# Patient Record
Sex: Female | Born: 1950
Health system: Southern US, Community
[De-identification: ages and names within clinical notes are randomized; demographics above are authoritative.]

## PROBLEM LIST (undated history)

## (undated) DIAGNOSIS — Z8616 Personal history of COVID-19: Secondary | ICD-10-CM

## (undated) DIAGNOSIS — M1990 Unspecified osteoarthritis, unspecified site: Secondary | ICD-10-CM

## (undated) DIAGNOSIS — M5136 Other intervertebral disc degeneration, lumbar region: Secondary | ICD-10-CM

## (undated) DIAGNOSIS — R7303 Prediabetes: Secondary | ICD-10-CM

## (undated) DIAGNOSIS — D649 Anemia, unspecified: Secondary | ICD-10-CM

## (undated) DIAGNOSIS — I82409 Acute embolism and thrombosis of unspecified deep veins of unspecified lower extremity: Secondary | ICD-10-CM

## (undated) DIAGNOSIS — H35411 Lattice degeneration of retina, right eye: Secondary | ICD-10-CM

## (undated) DIAGNOSIS — N950 Postmenopausal bleeding: Secondary | ICD-10-CM

## (undated) DIAGNOSIS — Z862 Personal history of diseases of the blood and blood-forming organs and certain disorders involving the immune mechanism: Secondary | ICD-10-CM

## (undated) DIAGNOSIS — I10 Essential (primary) hypertension: Secondary | ICD-10-CM

## (undated) DIAGNOSIS — M51369 Other intervertebral disc degeneration, lumbar region without mention of lumbar back pain or lower extremity pain: Secondary | ICD-10-CM

## (undated) DIAGNOSIS — Z789 Other specified health status: Secondary | ICD-10-CM

## (undated) DIAGNOSIS — M8580 Other specified disorders of bone density and structure, unspecified site: Secondary | ICD-10-CM

## (undated) DIAGNOSIS — E039 Hypothyroidism, unspecified: Secondary | ICD-10-CM

## (undated) DIAGNOSIS — G709 Myoneural disorder, unspecified: Secondary | ICD-10-CM

## (undated) DIAGNOSIS — K219 Gastro-esophageal reflux disease without esophagitis: Secondary | ICD-10-CM

## (undated) HISTORY — DX: Anemia, unspecified: D64.9

## (undated) HISTORY — PX: OOPHORECTOMY: SHX86

## (undated) HISTORY — DX: Hypothyroidism, unspecified: E03.9

## (undated) HISTORY — DX: Acute embolism and thrombosis of unspecified deep veins of unspecified lower extremity: I82.409

## (undated) HISTORY — PX: JOINT REPLACEMENT: SHX530

## (undated) HISTORY — DX: Gastro-esophageal reflux disease without esophagitis: K21.9

## (undated) HISTORY — DX: Essential (primary) hypertension: I10

---

## 1997-05-14 HISTORY — PX: OOPHORECTOMY: SHX86

## 1999-05-15 DIAGNOSIS — K219 Gastro-esophageal reflux disease without esophagitis: Secondary | ICD-10-CM

## 1999-05-15 HISTORY — DX: Gastro-esophageal reflux disease without esophagitis: K21.9

## 2001-05-14 DIAGNOSIS — Z86718 Personal history of other venous thrombosis and embolism: Secondary | ICD-10-CM

## 2001-05-14 HISTORY — DX: Personal history of other venous thrombosis and embolism: Z86.718

## 2003-03-16 HISTORY — PX: KNEE ARTHROSCOPY: SHX127

## 2003-05-15 DIAGNOSIS — D649 Anemia, unspecified: Secondary | ICD-10-CM

## 2003-05-15 HISTORY — DX: Anemia, unspecified: D64.9

## 2003-07-07 ENCOUNTER — Other Ambulatory Visit: Admission: RE | Admit: 2003-07-07 | Discharge: 2003-07-07 | Payer: Self-pay | Admitting: Internal Medicine

## 2003-07-21 ENCOUNTER — Encounter: Payer: Self-pay | Admitting: Gastroenterology

## 2003-07-28 ENCOUNTER — Encounter: Payer: Self-pay | Admitting: Gastroenterology

## 2004-02-09 ENCOUNTER — Emergency Department (HOSPITAL_COMMUNITY): Admission: EM | Admit: 2004-02-09 | Discharge: 2004-02-09 | Payer: Self-pay | Admitting: Emergency Medicine

## 2004-04-05 ENCOUNTER — Ambulatory Visit: Payer: Self-pay | Admitting: Internal Medicine

## 2004-04-12 ENCOUNTER — Ambulatory Visit: Payer: Self-pay | Admitting: Internal Medicine

## 2004-07-04 ENCOUNTER — Ambulatory Visit: Payer: Self-pay | Admitting: Internal Medicine

## 2004-07-12 ENCOUNTER — Ambulatory Visit: Payer: Self-pay | Admitting: Internal Medicine

## 2004-08-23 ENCOUNTER — Encounter: Admission: RE | Admit: 2004-08-23 | Discharge: 2004-08-23 | Payer: Self-pay | Admitting: Internal Medicine

## 2004-08-28 ENCOUNTER — Ambulatory Visit: Payer: Self-pay | Admitting: Internal Medicine

## 2004-09-20 ENCOUNTER — Ambulatory Visit: Payer: Self-pay | Admitting: Internal Medicine

## 2004-10-11 ENCOUNTER — Ambulatory Visit: Payer: Self-pay | Admitting: Internal Medicine

## 2004-10-24 ENCOUNTER — Ambulatory Visit: Payer: Self-pay | Admitting: Family Medicine

## 2004-10-25 ENCOUNTER — Ambulatory Visit: Payer: Self-pay | Admitting: Internal Medicine

## 2004-11-28 ENCOUNTER — Ambulatory Visit: Payer: Self-pay | Admitting: Internal Medicine

## 2005-03-14 ENCOUNTER — Ambulatory Visit: Payer: Self-pay | Admitting: Internal Medicine

## 2005-03-21 ENCOUNTER — Other Ambulatory Visit: Admission: RE | Admit: 2005-03-21 | Discharge: 2005-03-21 | Payer: Self-pay | Admitting: Internal Medicine

## 2005-03-21 ENCOUNTER — Encounter: Payer: Self-pay | Admitting: Internal Medicine

## 2005-03-21 ENCOUNTER — Ambulatory Visit: Payer: Self-pay | Admitting: Internal Medicine

## 2005-07-17 ENCOUNTER — Ambulatory Visit: Payer: Self-pay | Admitting: Internal Medicine

## 2005-07-18 ENCOUNTER — Ambulatory Visit: Payer: Self-pay | Admitting: Internal Medicine

## 2005-07-27 ENCOUNTER — Ambulatory Visit: Payer: Self-pay | Admitting: Internal Medicine

## 2005-07-30 ENCOUNTER — Ambulatory Visit: Payer: Self-pay | Admitting: Internal Medicine

## 2005-07-31 ENCOUNTER — Ambulatory Visit: Payer: Self-pay | Admitting: Internal Medicine

## 2006-01-02 ENCOUNTER — Ambulatory Visit: Payer: Self-pay | Admitting: Internal Medicine

## 2006-02-01 ENCOUNTER — Ambulatory Visit: Payer: Self-pay | Admitting: Internal Medicine

## 2006-02-07 ENCOUNTER — Encounter: Admission: RE | Admit: 2006-02-07 | Discharge: 2006-02-07 | Payer: Self-pay | Admitting: Internal Medicine

## 2006-03-20 ENCOUNTER — Ambulatory Visit: Payer: Self-pay | Admitting: Internal Medicine

## 2006-03-20 LAB — CONVERTED CEMR LAB
ALT: 26 units/L (ref 0–40)
AST: 31 units/L (ref 0–37)
Albumin: 3.6 g/dL (ref 3.5–5.2)
Alkaline Phosphatase: 64 units/L (ref 39–117)
BUN: 10 mg/dL (ref 6–23)
Basophils Absolute: 0 10*3/uL (ref 0.0–0.1)
Basophils Relative: 0.7 % (ref 0.0–1.0)
CO2: 28 meq/L (ref 19–32)
Calcium: 9.7 mg/dL (ref 8.4–10.5)
Chloride: 105 meq/L (ref 96–112)
Chol/HDL Ratio, serum: 4.6
Cholesterol: 204 mg/dL (ref 0–200)
Creatinine, Ser: 0.7 mg/dL (ref 0.4–1.2)
Eosinophil percent: 1.5 % (ref 0.0–5.0)
Free T4: 0.7 ng/dL — ABNORMAL LOW (ref 0.9–1.8)
GFR calc non Af Amer: 92 mL/min
Glomerular Filtration Rate, Af Am: 111 mL/min/{1.73_m2}
Glucose, Bld: 94 mg/dL (ref 70–99)
HCT: 37.5 % (ref 36.0–46.0)
HDL: 44.8 mg/dL (ref >39.0)
Hemoglobin: 12.5 g/dL (ref 12.0–15.0)
LDL DIRECT: 142.2 mg/dL
Lymphocytes Relative: 40.1 % (ref 12.0–46.0)
MCHC: 33.3 g/dL (ref 30.0–36.0)
MCV: 91.8 fL (ref 78.0–100.0)
Monocytes Absolute: 0.3 10*3/uL (ref 0.2–0.7)
Monocytes Relative: 6.8 % (ref 3.0–11.0)
Neutro Abs: 2.4 10*3/uL (ref 1.4–7.7)
Neutrophils Relative %: 50.9 % (ref 43.0–77.0)
Platelets: 264 10*3/uL (ref 150–400)
Potassium: 3.8 meq/L (ref 3.5–5.1)
RBC: 4.08 M/uL (ref 3.87–5.11)
RDW: 12.3 % (ref 11.5–14.6)
Sodium: 138 meq/L (ref 135–145)
TSH: 5.31 microintl units/mL (ref 0.35–5.50)
Total Bilirubin: 1.2 mg/dL (ref 0.3–1.2)
Total Protein: 7.9 g/dL (ref 6.0–8.3)
Triglyceride fasting, serum: 67 mg/dL (ref 0–149)
VLDL: 13 mg/dL (ref 0–40)
WBC: 4.7 10*3/uL (ref 4.5–10.5)

## 2006-03-25 ENCOUNTER — Encounter: Admission: RE | Admit: 2006-03-25 | Discharge: 2006-03-25 | Payer: Self-pay | Admitting: Internal Medicine

## 2006-03-26 ENCOUNTER — Ambulatory Visit: Payer: Self-pay | Admitting: Internal Medicine

## 2006-07-11 ENCOUNTER — Ambulatory Visit: Payer: Self-pay | Admitting: Internal Medicine

## 2006-08-01 ENCOUNTER — Encounter: Admission: RE | Admit: 2006-08-01 | Discharge: 2006-08-01 | Payer: Self-pay | Admitting: Internal Medicine

## 2006-08-13 ENCOUNTER — Ambulatory Visit: Payer: Self-pay | Admitting: Internal Medicine

## 2006-09-17 ENCOUNTER — Ambulatory Visit: Payer: Self-pay | Admitting: Internal Medicine

## 2006-09-17 LAB — CONVERTED CEMR LAB
Cholesterol: 195 mg/dL (ref 0–200)
HDL: 44.2 mg/dL (ref >39.0)
LDL Cholesterol: 134 mg/dL — ABNORMAL HIGH (ref 0–99)
TSH: 8.43 microintl units/mL — ABNORMAL HIGH (ref 0.35–5.50)
Total CHOL/HDL Ratio: 4.4
Triglycerides: 82 mg/dL (ref 0–149)
VLDL: 16 mg/dL (ref 0–40)

## 2006-09-23 ENCOUNTER — Ambulatory Visit: Payer: Self-pay | Admitting: Internal Medicine

## 2006-11-11 ENCOUNTER — Ambulatory Visit: Payer: Self-pay | Admitting: Internal Medicine

## 2006-11-12 ENCOUNTER — Ambulatory Visit: Payer: Self-pay | Admitting: Internal Medicine

## 2006-11-18 DIAGNOSIS — I10 Essential (primary) hypertension: Secondary | ICD-10-CM | POA: Insufficient documentation

## 2006-11-18 DIAGNOSIS — D649 Anemia, unspecified: Secondary | ICD-10-CM | POA: Insufficient documentation

## 2006-11-18 DIAGNOSIS — K219 Gastro-esophageal reflux disease without esophagitis: Secondary | ICD-10-CM | POA: Insufficient documentation

## 2006-11-18 DIAGNOSIS — E039 Hypothyroidism, unspecified: Secondary | ICD-10-CM | POA: Insufficient documentation

## 2006-12-30 ENCOUNTER — Ambulatory Visit: Payer: Self-pay | Admitting: Internal Medicine

## 2006-12-31 LAB — CONVERTED CEMR LAB
Basophils Absolute: 0 10*3/uL (ref 0.0–0.1)
Basophils Relative: 0.4 % (ref 0.0–1.0)
Eosinophils Absolute: 0.1 10*3/uL (ref 0.0–0.6)
Eosinophils Relative: 2.1 % (ref 0.0–5.0)
Ferritin: 23 ng/mL (ref 10.0–291.0)
HCT: 34.2 % — ABNORMAL LOW (ref 36.0–46.0)
Hemoglobin: 12 g/dL (ref 12.0–15.0)
Iron: 69 ug/dL (ref 42–145)
Lymphocytes Relative: 37.9 % (ref 12.0–46.0)
MCHC: 35.1 g/dL (ref 30.0–36.0)
MCV: 89.1 fL (ref 78.0–100.0)
Monocytes Absolute: 0.5 10*3/uL (ref 0.2–0.7)
Monocytes Relative: 8.8 % (ref 3.0–11.0)
Neutro Abs: 2.7 10*3/uL (ref 1.4–7.7)
Neutrophils Relative %: 50.8 % (ref 43.0–77.0)
Platelets: 270 10*3/uL (ref 150–400)
RBC: 3.84 M/uL — ABNORMAL LOW (ref 3.87–5.11)
RDW: 12.5 % (ref 11.5–14.6)
TSH: 1.55 microintl units/mL (ref 0.35–5.50)
Transferrin: 278.6 mg/dL (ref >=212.0)
WBC: 5.3 10*3/uL (ref 4.5–10.5)

## 2007-01-02 ENCOUNTER — Telehealth: Payer: Self-pay | Admitting: Internal Medicine

## 2007-03-11 ENCOUNTER — Encounter: Admission: RE | Admit: 2007-03-11 | Discharge: 2007-03-11 | Payer: Self-pay | Admitting: Internal Medicine

## 2007-03-14 ENCOUNTER — Ambulatory Visit: Payer: Self-pay | Admitting: Internal Medicine

## 2007-04-01 ENCOUNTER — Ambulatory Visit: Payer: Self-pay | Admitting: Internal Medicine

## 2007-04-03 ENCOUNTER — Ambulatory Visit: Payer: Self-pay | Admitting: Internal Medicine

## 2007-04-03 LAB — CONVERTED CEMR LAB
Bilirubin Urine: NEGATIVE
Blood in Urine, dipstick: NEGATIVE
Glucose, Urine, Semiquant: NEGATIVE
Ketones, urine, test strip: NEGATIVE
Nitrite: NEGATIVE
Protein, U semiquant: NEGATIVE
Specific Gravity, Urine: 1.015
Urobilinogen, UA: 0.2
pH: 8.5

## 2007-04-04 LAB — CONVERTED CEMR LAB
ALT: 18 units/L (ref 0–35)
AST: 23 units/L (ref 0–37)
Albumin: 3.5 g/dL (ref 3.5–5.2)
Alkaline Phosphatase: 68 units/L (ref 39–117)
BUN: 10 mg/dL (ref 6–23)
Basophils Absolute: 0 10*3/uL (ref 0.0–0.1)
Basophils Relative: 0.8 % (ref 0.0–1.0)
Bilirubin, Direct: 0.1 mg/dL (ref 0.0–0.3)
CO2: 28 meq/L (ref 19–32)
Calcium: 9.6 mg/dL (ref 8.4–10.5)
Chloride: 108 meq/L (ref 96–112)
Cholesterol: 182 mg/dL (ref 0–200)
Creatinine, Ser: 0.6 mg/dL (ref 0.4–1.2)
Eosinophils Absolute: 0.1 10*3/uL (ref 0.0–0.6)
Eosinophils Relative: 1.7 % (ref 0.0–5.0)
GFR calc Af Amer: 132 mL/min
GFR calc non Af Amer: 109 mL/min
Glucose, Bld: 90 mg/dL (ref 70–99)
HCT: 35.5 % — ABNORMAL LOW (ref 36.0–46.0)
HDL: 37.6 mg/dL — ABNORMAL LOW (ref >39.0)
Hemoglobin: 12 g/dL (ref 12.0–15.0)
LDL Cholesterol: 128 mg/dL — ABNORMAL HIGH (ref 0–99)
Lymphocytes Relative: 43.6 % (ref 12.0–46.0)
MCHC: 33.8 g/dL (ref 30.0–36.0)
MCV: 91.6 fL (ref 78.0–100.0)
Monocytes Absolute: 0.3 10*3/uL (ref 0.2–0.7)
Monocytes Relative: 6.7 % (ref 3.0–11.0)
Neutro Abs: 2.5 10*3/uL (ref 1.4–7.7)
Neutrophils Relative %: 47.2 % (ref 43.0–77.0)
Platelets: 285 10*3/uL (ref 150–400)
Potassium: 4.4 meq/L (ref 3.5–5.1)
RBC: 3.88 M/uL (ref 3.87–5.11)
RDW: 12.4 % (ref 11.5–14.6)
Sodium: 141 meq/L (ref 135–145)
TSH: 2.08 microintl units/mL (ref 0.35–5.50)
Total Bilirubin: 1.1 mg/dL (ref 0.3–1.2)
Total CHOL/HDL Ratio: 4.8
Total Protein: 7.1 g/dL (ref 6.0–8.3)
Triglycerides: 81 mg/dL (ref 0–149)
VLDL: 16 mg/dL (ref 0–40)
WBC: 5.2 10*3/uL (ref 4.5–10.5)

## 2007-04-08 DIAGNOSIS — M899 Disorder of bone, unspecified: Secondary | ICD-10-CM | POA: Insufficient documentation

## 2007-04-08 DIAGNOSIS — M949 Disorder of cartilage, unspecified: Secondary | ICD-10-CM

## 2007-04-08 LAB — CONVERTED CEMR LAB
IgA: 379 mg/dL — ABNORMAL HIGH (ref 68–378)
Vit D, 1,25-Dihydroxy: 24 — ABNORMAL LOW (ref 30–89)

## 2007-04-15 ENCOUNTER — Encounter: Payer: Self-pay | Admitting: Internal Medicine

## 2007-04-15 ENCOUNTER — Ambulatory Visit: Payer: Self-pay | Admitting: Internal Medicine

## 2007-04-15 ENCOUNTER — Other Ambulatory Visit: Admission: RE | Admit: 2007-04-15 | Discharge: 2007-04-15 | Payer: Self-pay | Admitting: Internal Medicine

## 2007-04-17 ENCOUNTER — Encounter: Payer: Self-pay | Admitting: Internal Medicine

## 2007-04-17 ENCOUNTER — Ambulatory Visit: Payer: Self-pay | Admitting: Internal Medicine

## 2007-07-08 ENCOUNTER — Ambulatory Visit: Payer: Self-pay | Admitting: Internal Medicine

## 2007-07-09 ENCOUNTER — Ambulatory Visit: Payer: Self-pay | Admitting: Internal Medicine

## 2007-08-14 ENCOUNTER — Ambulatory Visit: Payer: Self-pay | Admitting: Internal Medicine

## 2007-08-20 ENCOUNTER — Telehealth: Payer: Self-pay | Admitting: Internal Medicine

## 2007-08-25 ENCOUNTER — Ambulatory Visit: Payer: Self-pay | Admitting: Internal Medicine

## 2007-08-25 DIAGNOSIS — E559 Vitamin D deficiency, unspecified: Secondary | ICD-10-CM | POA: Insufficient documentation

## 2007-10-07 ENCOUNTER — Ambulatory Visit: Payer: Self-pay | Admitting: Internal Medicine

## 2007-11-07 ENCOUNTER — Ambulatory Visit: Payer: Self-pay | Admitting: Internal Medicine

## 2008-04-01 ENCOUNTER — Ambulatory Visit: Payer: Self-pay | Admitting: Internal Medicine

## 2008-08-16 ENCOUNTER — Telehealth: Payer: Self-pay | Admitting: Family Medicine

## 2008-08-16 ENCOUNTER — Ambulatory Visit: Payer: Self-pay | Admitting: Family Medicine

## 2008-09-06 ENCOUNTER — Encounter: Admission: RE | Admit: 2008-09-06 | Discharge: 2008-09-06 | Payer: Self-pay | Admitting: Internal Medicine

## 2008-09-06 LAB — HM MAMMOGRAPHY

## 2008-09-24 ENCOUNTER — Ambulatory Visit: Payer: Self-pay | Admitting: Internal Medicine

## 2008-09-24 LAB — CONVERTED CEMR LAB
ALT: 23 units/L (ref 0–35)
Albumin: 3.6 g/dL (ref 3.5–5.2)
Alkaline Phosphatase: 62 units/L (ref 39–117)
BUN: 7 mg/dL (ref 6–23)
Basophils Absolute: 0 10*3/uL (ref 0.0–0.1)
Bilirubin Urine: NEGATIVE
Bilirubin, Direct: 0.1 mg/dL (ref 0.0–0.3)
Calcium: 9.3 mg/dL (ref 8.4–10.5)
Chloride: 108 meq/L (ref 96–112)
Eosinophils Absolute: 0.1 10*3/uL (ref 0.0–0.7)
GFR calc non Af Amer: 108.41 mL/min (ref >60)
Hemoglobin: 11.8 g/dL — ABNORMAL LOW (ref 12.0–15.0)
Ketones, urine, test strip: NEGATIVE
MCHC: 33.4 g/dL (ref 30.0–36.0)
MCV: 93.3 fL (ref 78.0–100.0)
Nitrite: NEGATIVE
Platelets: 238 10*3/uL (ref 150.0–400.0)
Specific Gravity, Urine: 1.01
TSH: 1.69 microintl units/mL (ref 0.35–5.50)
Total CHOL/HDL Ratio: 4
Total Protein: 7.7 g/dL (ref 6.0–8.3)
Triglycerides: 69 mg/dL (ref 0.0–149.0)
WBC: 3.9 10*3/uL — ABNORMAL LOW (ref 4.5–10.5)
pH: 7

## 2008-09-30 ENCOUNTER — Ambulatory Visit: Payer: Self-pay | Admitting: Internal Medicine

## 2008-10-01 ENCOUNTER — Telehealth: Payer: Self-pay | Admitting: Internal Medicine

## 2008-10-02 ENCOUNTER — Encounter: Payer: Self-pay | Admitting: Internal Medicine

## 2008-10-02 LAB — CONVERTED CEMR LAB
Basophils Relative: 0.3 % (ref 0.0–3.0)
Ferritin: 23.8 ng/mL (ref 10.0–291.0)
Folate: 13.4 ng/mL
HCT: 34.9 % — ABNORMAL LOW (ref 36.0–46.0)
Lymphs Abs: 2.2 10*3/uL (ref 0.7–4.0)
Monocytes Absolute: 0.4 10*3/uL (ref 0.1–1.0)
Neutro Abs: 2.6 10*3/uL (ref 1.4–7.7)
Vitamin B-12: 716 pg/mL (ref 211–911)
WBC: 5.3 10*3/uL (ref 4.5–10.5)

## 2008-10-14 ENCOUNTER — Encounter: Payer: Self-pay | Admitting: Internal Medicine

## 2008-10-28 ENCOUNTER — Ambulatory Visit: Payer: Self-pay | Admitting: Gastroenterology

## 2008-11-03 ENCOUNTER — Ambulatory Visit: Payer: Self-pay | Admitting: Gastroenterology

## 2009-03-23 ENCOUNTER — Encounter: Payer: Self-pay | Admitting: Internal Medicine

## 2009-05-23 ENCOUNTER — Telehealth: Payer: Self-pay | Admitting: Internal Medicine

## 2009-06-09 ENCOUNTER — Telehealth: Payer: Self-pay | Admitting: Internal Medicine

## 2009-06-10 ENCOUNTER — Ambulatory Visit: Payer: Self-pay | Admitting: Internal Medicine

## 2009-06-13 LAB — CONVERTED CEMR LAB: Hemoglobin: 12.3 g/dL

## 2009-06-17 ENCOUNTER — Encounter: Payer: Self-pay | Admitting: Internal Medicine

## 2009-06-17 ENCOUNTER — Ambulatory Visit: Payer: Self-pay | Admitting: Internal Medicine

## 2009-06-28 ENCOUNTER — Telehealth: Payer: Self-pay | Admitting: Internal Medicine

## 2009-06-29 ENCOUNTER — Encounter: Payer: Self-pay | Admitting: Internal Medicine

## 2009-07-28 ENCOUNTER — Telehealth: Payer: Self-pay | Admitting: Internal Medicine

## 2009-08-01 ENCOUNTER — Ambulatory Visit: Payer: Self-pay | Admitting: Internal Medicine

## 2009-08-01 LAB — CONVERTED CEMR LAB
ALT: 23 units/L (ref 0–35)
AST: 22 units/L (ref 0–37)
Alkaline Phosphatase: 62 units/L (ref 39–117)
BUN: 10 mg/dL (ref 6–23)
Basophils Relative: 0.4 % (ref 0.0–3.0)
Bilirubin, Direct: 0 mg/dL (ref 0.0–0.3)
Chloride: 110 meq/L (ref 96–112)
Creatinine, Ser: 0.6 mg/dL (ref 0.4–1.2)
Eosinophils Absolute: 0.1 10*3/uL (ref 0.0–0.7)
Eosinophils Relative: 3.4 % (ref 0.0–5.0)
HCT: 34.2 % — ABNORMAL LOW (ref 36.0–46.0)
MCHC: 33.1 g/dL (ref 30.0–36.0)
MCV: 95.3 fL (ref 78.0–100.0)
Monocytes Absolute: 0.3 10*3/uL (ref 0.1–1.0)
Monocytes Relative: 8 % (ref 3.0–12.0)
Neutro Abs: 2 10*3/uL (ref 1.4–7.7)
Neutrophils Relative %: 46.9 % (ref 43.0–77.0)
Platelets: 232 10*3/uL (ref 150.0–400.0)
Sodium: 140 meq/L (ref 135–145)
TSH: 1.26 microintl units/mL (ref 0.35–5.50)
WBC: 4.1 10*3/uL — ABNORMAL LOW (ref 4.5–10.5)

## 2009-08-02 ENCOUNTER — Telehealth: Payer: Self-pay | Admitting: *Deleted

## 2009-11-24 ENCOUNTER — Ambulatory Visit (HOSPITAL_COMMUNITY): Admission: RE | Admit: 2009-11-24 | Discharge: 2009-11-24 | Payer: Self-pay | Admitting: Obstetrics & Gynecology

## 2010-01-25 ENCOUNTER — Telehealth: Payer: Self-pay | Admitting: Internal Medicine

## 2010-02-06 ENCOUNTER — Ambulatory Visit: Payer: Self-pay | Admitting: Internal Medicine

## 2010-02-06 LAB — CONVERTED CEMR LAB
AST: 20 units/L (ref 0–37)
Albumin: 3.4 g/dL — ABNORMAL LOW (ref 3.5–5.2)
Basophils Absolute: 0 10*3/uL (ref 0.0–0.1)
Basophils Relative: 0.5 % (ref 0.0–3.0)
Bilirubin Urine: NEGATIVE
Blood in Urine, dipstick: NEGATIVE
CO2: 27 meq/L (ref 19–32)
Calcium: 9.1 mg/dL (ref 8.4–10.5)
Cholesterol: 176 mg/dL (ref 0–200)
Eosinophils Relative: 1.9 % (ref 0.0–5.0)
Glucose, Urine, Semiquant: NEGATIVE
HDL: 37.1 mg/dL — ABNORMAL LOW (ref >39.00)
Hemoglobin: 12 g/dL (ref 12.0–15.0)
Ketones, urine, test strip: NEGATIVE
Lymphocytes Relative: 38.7 % (ref 12.0–46.0)
MCHC: 34.1 g/dL (ref 30.0–36.0)
MCV: 92.8 fL (ref 78.0–100.0)
Monocytes Absolute: 0.4 10*3/uL (ref 0.1–1.0)
Protein, U semiquant: NEGATIVE
RBC: 3.79 M/uL — ABNORMAL LOW (ref 3.87–5.11)
RDW: 13.4 % (ref 11.5–14.6)
Specific Gravity, Urine: 1.015
TSH: 2.14 microintl units/mL (ref 0.35–5.50)
Total Protein: 6.7 g/dL (ref 6.0–8.3)
Triglycerides: 94 mg/dL (ref 0.0–149.0)
Urobilinogen, UA: 0.2

## 2010-02-09 LAB — CONVERTED CEMR LAB: Vit D, 25-Hydroxy: 30 ng/mL (ref 30–89)

## 2010-02-10 ENCOUNTER — Ambulatory Visit: Payer: Self-pay | Admitting: Internal Medicine

## 2010-02-10 DIAGNOSIS — Z86718 Personal history of other venous thrombosis and embolism: Secondary | ICD-10-CM | POA: Insufficient documentation

## 2010-03-20 ENCOUNTER — Ambulatory Visit: Payer: Self-pay | Admitting: Internal Medicine

## 2010-06-13 NOTE — Assessment & Plan Note (Signed)
Summary: FLU SHOT  // RS   Nurse Visit   Allergies: 1)  Lovenox (Enoxaparin Sodium)  Orders Added: 1)  Admin 1st Vaccine [90471] 2)  Flu Vaccine 9yrs + [40981] Flu Vaccine Consent Questions     Do you have a history of severe allergic reactions to this vaccine? no    Any prior history of allergic reactions to egg and/or gelatin? no    Do you have a sensitivity to the preservative Thimersol? no    Do you have a past history of Guillan-Barre Syndrome? no    Do you currently have an acute febrile illness? no    Have you ever had a severe reaction to latex? no    Vaccine information given and explained to patient? yes    Are you currently pregnant? no    Lot Number:[id number]   Exp Date:11/11/2010   Site Given  Left Deltoid IM .lbflu1

## 2010-06-13 NOTE — Progress Notes (Signed)
Summary: med change  Phone Note Call from Patient Call back at Home Phone [phone]   Caller: Patient Call For: Kimberly [name] Summary of Call: states [facility] has told her the avalide can be changed to avapro and hctz to save her money.  Do you want to change? Initial call taken by: Gladis Riffle, RN,  June 28, 2009 2:28 PM  Follow-up for Phone Call        ok.  Follow-up by: Kimberly [name],  June 28, 2009 2:41 PM  Additional Follow-up for Phone Call Additional follow up Details #1::        see Rx.Patient notified.  Additional Follow-up by: Gladis Riffle, RN,  June 28, 2009 4:07 PM    New/Updated Medications: AVAPRO 300 MG TABS (IRBESARTAN) Take 1 tablet by mouth once a day or as directed HYDROCHLOROTHIAZIDE 25 MG TABS (HYDROCHLOROTHIAZIDE) Take 1 tablet by mouth once a day or as directed Prescriptions: AVAPRO 300 MG TABS (IRBESARTAN) Take 1 tablet by mouth once a day or as directed  #90 x 3   Entered by:   Gladis Riffle, RN   Authorized by:   Kimberly [name]   Signed by:   Gladis Riffle, RN on 06/28/2009   Method used:   Electronically to        [facility]. [phone]* (retail)       [phone] [address]       [place], [place]  [address]       Ph: [phone]       Fax: [phone]   RxID:   [id number] HYDROCHLOROTHIAZIDE 25 MG TABS (HYDROCHLOROTHIAZIDE) Take 1 tablet by mouth once a day or as directed  #90 x 3   Entered by:   Gladis Riffle, RN   Authorized by:   Kimberly [name]   Signed by:   Gladis Riffle, RN on 06/28/2009   Method used:   Electronically to        [facility]. [phone]* (retail)       [phone] [address]       [place], [place]  [address]       Ph: [phone]       Fax: [phone]   RxID:   [id number]

## 2010-06-13 NOTE — Letter (Signed)
Summary: Generic Letter  [place] at [facility]  [address] [place], [place] [address]   Phone: [phone]  Fax: [phone]    06/10/2009  Kimberly [name] [address] [place], [place]  [address]  To Whom It May Concern,  Ms. Turrubiates has an arthritic condition that necessitates that she maintain a regular exercise program.  I recommend that she continues her regular exercise program at [facility].           Sincerely,     Birdie Sons MD [place] [facility]

## 2010-06-13 NOTE — Progress Notes (Signed)
Summary: new rx needed  Phone Note Call from Patient Call back at Home Phone [phone]   Caller: Patient---live call Summary of Call: having knee pain and would like new rx for meloxicam called to [facility]. pt cpx is 02-10-2010. Initial call taken by: Warnell Forester,  January 25, 2010 2:29 PM    New/Updated Medications: MELOXICAM 15 MG TABS (MELOXICAM) one by mouth daily Prescriptions: MELOXICAM 15 MG TABS (MELOXICAM) one by mouth daily  #30 x 0   Entered and Authorized by:   Birdie Sons MD   Signed by:   Birdie Sons MD on 01/26/2010   Method used:   Electronically to        [facility]. [phone]* (retail)       [phone] [address].       [place], [place]  [address]       Ph: [phone]       Fax: [phone]   RxID:   [id number]

## 2010-06-13 NOTE — Assessment & Plan Note (Signed)
Summary: cpx//ccm   Vital Signs:  Patient profile:   60 year old female Menstrual status:  postmenopausal Height:      60.75 inches Weight:      164 pounds BMI:     31.36 Temp:     97.8 degrees F oral Pulse rate:   80 / minute Pulse rhythm:   regular Resp:     12 per minute BP sitting:   120 / 68  (left arm) Cuff size:   regular  Vitals Entered By: Sid Falcon LPN (February 10, 2010 9:11 AM)  Nutrition Counseling: Patient's BMI is greater than 25 and therefore counseled on weight management options.  Primary Care Khia Dieterich:  Birdie Sons, MD   History of Present Illness: CPX  Current Problems (verified): 1)  Family History of Alcoholism/addiction  (ICD-V61.41) 2)  Vitamin D Deficiency  (ICD-268.9) 3)  Preventive Health Care  (ICD-V70.0) 4)  Osteopenia  (ICD-733.90) 5)  Dvt, Hx of  (ICD-V12.51) 6)  Hypothyroidism  (ICD-244.9) 7)  Hypertension  (ICD-401.9) 8)  Gerd  (ICD-530.81) 9)  Anemia-nos  (ICD-285.9)  Current Medications (verified): 1)  Avapro 300 Mg Tabs (Irbesartan) .... Take 1 Tablet By Mouth Once A Day or As Directed 2)  Levothyroxine Sodium 50 Mcg Tabs (Levothyroxine Sodium) .... Take 1 Tablet By Mouth Once A Day 3)  Vitamin D 1000 Unit  Caps (Cholecalciferol) .... Once Daily 4)  Multivitamins  Tabs (Multiple Vitamin) .... Once Daily 5)  Hydrochlorothiazide 25 Mg Tabs (Hydrochlorothiazide) .... Take 1 Tablet By Mouth Once A Day or As Directed 6)  Vitamin D (Ergocalciferol) 50000 Unit Caps (Ergocalciferol) .... Take One Tab By Mouth Once A Week For 16 Weeks 7)  Meloxicam 15 Mg Tabs (Meloxicam) .... One By Mouth Daily  Allergies: 1)  Lovenox (Enoxaparin Sodium)  Past History:  Past Medical History: Last updated: 11/18/2006 Anemia 2005  Hb 11.5 GERD Hypertension Hypothyroidism (off meds) DVT 2003, coumadin X 6 mo DVT, hx of  Past Surgical History: Last updated: 11/18/2006 Oophorectomy unilateral L arthroscopy R knee  03/16/03  Family  History: Last updated: 31-Oct-2008 mother-deceased 69  Family History Diabetes 1st degree relative father deceased 71 yo sudden death Family History of Alcoholism/Addiction--brother No FH of Colon Cancer:  Social History: Last updated: 10-31-2008 Married Never Smoked Alcohol use-no Regular exercise-yes, walks regularly brother--alcohol Illicit Drug Use - no Daily Caffeine Use: 2 cups of tea daily  Risk Factors: Exercise: yes (12/30/2006)  Risk Factors: Smoking Status: never (12/30/2006)   Impression & Recommendations:  Problem # 1:  PREVENTIVE HEALTH CARE (ICD-V70.0) health maint UTD encouraged to lose weight and exercise daily  Problem # 2:  VITAMIN D DEFICIENCY (ICD-268.9) s/p replacement  Problem # 3:  HYPERTENSION (ICD-401.9) controlled continue current medications  Her updated medication list for this problem includes:    Avapro 300 Mg Tabs (Irbesartan) .Marland Kitchen... Take 1 tablet by mouth once a day or as directed    Hydrochlorothiazide 25 Mg Tabs (Hydrochlorothiazide) .Marland Kitchen... Take 1 tablet by mouth once a day or as directed  BP today: 120/68 Prior BP: 112/72 (2008-10-31)  Labs Reviewed: K+: 3.9 (02/06/2010) Creat: : 0.5 (02/06/2010)   Chol: 176 (02/06/2010)   HDL: 37.10 (02/06/2010)   LDL: 120 (02/06/2010)   TG: 94.0 (02/06/2010)  Problem # 4:  HYPOTHYROIDISM (ICD-244.9) controlled continue current medications  Her updated medication list for this problem includes:    Levothyroxine Sodium 50 Mcg Tabs (Levothyroxine sodium) .Marland Kitchen... Take 1 tablet by mouth once a day  Labs Reviewed:  TSH: 2.14 (02/06/2010)    Chol: 176 (02/06/2010)   HDL: 37.10 (02/06/2010)   LDL: 120 (02/06/2010)   TG: 94.0 (02/06/2010)  Complete Medication List: 1)  Avapro 300 Mg Tabs (Irbesartan) .... Take 1 tablet by mouth once a day or as directed 2)  Levothyroxine Sodium 50 Mcg Tabs (Levothyroxine sodium) .... Take 1 tablet by mouth once a day 3)  Vitamin D 1000 Unit Caps (Cholecalciferol)  .... Once daily 4)  Multivitamins Tabs (Multiple vitamin) .... Once daily 5)  Hydrochlorothiazide 25 Mg Tabs (Hydrochlorothiazide) .... Take 1 tablet by mouth once a day or as directed 6)  Vitamin D (ergocalciferol) 50000 Unit Caps (Ergocalciferol) .... Take one tab by mouth once a week for 16 weeks 7)  Meloxicam 15 Mg Tabs (Meloxicam) .... One by mouth daily  Patient Instructions: 1)  Please schedule a follow-up appointment in 6 months. Physical Exam General Appearance: well developed, well nourished, no acute distress Eyes: conjunctiva and lids normal, PERRLA, EOMI, fundi WNL Ears, Nose, Mouth, Throat: TM clear, nares clear, oral exam WNL Neck: supple, no lymphadenopathy, no thyromegaly, no JVD Respiratory: clear to auscultation and percussion, respiratory effort normal Cardiovascular: regular rate and rhythm, S1-S2, no murmur, rub or gallop, no bruits, peripheral pulses normal and symmetric, no cyanosis, clubbing, edema or varicosities Chest: no scars, masses, tenderness; no asymmetry, skin changes, nipple discharge   Gastrointestinal: soft, non-tender; no hepatosplenomegaly, masses; active bowel sounds all quadrants,  Lymphatic: no cervical, axillary or inguinal adenopathy Musculoskeletal: gait normal, muscle tone and strength WNL, no joint swelling, effusions, discoloration, crepitus  Skin: clear, good turgor, color WNL, no rashes, lesions, or ulcerations Neurologic: normal mental status, normal reflexes, normal strength, sensation, and motion Psychiatric: alert; oriented to person, place and time Other Exam:    Preventive Care Screening  Colonoscopy:    Next Due:  07/2013  Mammogram:    Date:  01/12/2010    Next Due:  01/2012    Results:  normal-pts report   Pap Smear:    Date:  01/12/2010    Next Due:  01/2013    Results:  normal-pts reprot

## 2010-06-13 NOTE — Miscellaneous (Signed)
Summary: Bone Density  Clinical Lists Changes  Orders: Added new Test order of T-Bone Densitometry (77080) - Signed Added new Test order of T-Lumbar Vertebral Assessment (77082) - Signed 

## 2010-06-13 NOTE — Progress Notes (Signed)
Summary: refill levothyroxine  Phone Note Refill Request Message from:  Fax from Pharmacy  Refills Requested: Medication #1:  LEVOTHYROXINE SODIUM 50 MCG TABS Take 1 tablet by mouth once a day [facility] ph---[phone]      fax---[phone]    Initial call taken by: Warnell Forester,  May 23, 2009 12:12 PM    Prescriptions: LEVOTHYROXINE SODIUM 50 MCG TABS (LEVOTHYROXINE SODIUM) Take 1 tablet by mouth once a day  #240 Each x 0   Entered by:   Gladis Riffle, RN   Authorized by:   Birdie Sons MD   Signed by:   Gladis Riffle, RN on 05/23/2009   Method used:   Electronically to        [facility]  [phone]* (retail)       [address]       [place], [place]  [address]       Ph: [phone] or [phone]       Fax: [phone]   RxID:   [id number]

## 2010-06-13 NOTE — Progress Notes (Signed)
Summary: rx sent  Phone Note Call from Patient Call back at [phone]   Caller: Patient Reason for Call: Talk to Nurse Summary of Call: patient returning your call. Please call back as soon as possible Initial call taken by: Everrett Coombe,  August 02, 2009 3:20 PM    New/Updated Medications: VITAMIN D (ERGOCALCIFEROL) 50000 UNIT CAPS (ERGOCALCIFEROL) take one tab by mouth once a week for 12 weeks Prescriptions: VITAMIN D (ERGOCALCIFEROL) 50000 UNIT CAPS (ERGOCALCIFEROL) take one tab by mouth once a week for 12 weeks  #12 x 0   Entered by:   Kern Reap CMA (AAMA)   Authorized by:   Birdie Sons MD   Signed by:   Kern Reap CMA (AAMA) on 08/02/2009   Method used:   Electronically to        [facility]  [phone]* (retail)       [address]       [place], [place]  [address]       Ph: [phone] or [phone]       Fax: [phone]   RxID:   [id number]

## 2010-06-13 NOTE — Progress Notes (Signed)
Summary: REQ FOR LABWRK  Phone Note Call from Patient   Caller: Patient Summary of Call: Pt called to req additional labs this coming week when Dr Cato Mulligan returns due to ongoing / increased fatigue..... Pt adv that she is tired all the time and she will be going out of town / country (leaving Sunday, 08/07/2009) and would like to have labs done prior to leaving to stay with daughter who is having a baby.... Pt adv that she had spoke with Dr Cato Mulligan about having additional labs (had hgb ckd - 06/10/2009 - 12.3 normal).... Can you advise?  Pt can be reached at [phone] with any questions or concerns.  Initial call taken by: Debbra Riding,  July 28, 2009 9:20 AM  Follow-up for Phone Call        see orders Follow-up by: Birdie Sons MD,  July 28, 2009 2:48 PM  Additional Follow-up for Phone Call Additional follow up Details #1::        Phone Call Completed----Contacted pt and appt was scheduled for labwrk on Monday, 08/01/2009.... Pt aware of fasting labs.  Additional Follow-up by: Debbra Riding,  July 28, 2009 4:14 PM  New Problems: FATIGUE (ICD-780.79)   New Problems: FATIGUE (ICD-780.79)

## 2010-06-13 NOTE — Medication Information (Signed)
Summary: Coverage Approval for Avapro  Coverage Approval for Avapro   Imported By: Maryln Gottron 07/06/2009 11:30:48  _____________________________________________________________________  External Attachment:    Type:   Image     Comment:   External Document

## 2010-06-13 NOTE — Progress Notes (Signed)
Summary: request  Phone Note Call from Patient   Caller: Patient via note Call For: Birdie Sons MD Summary of Call: requests letter certifying continues exercise at [facility] for arthritis.  ok to write? Initial call taken by: Gladis Riffle, RN,  June 09, 2009 3:59 PM  Follow-up for Phone Call        ok Follow-up by: Birdie Sons MD,  June 10, 2009 11:56 AM  Additional Follow-up for Phone Call Additional follow up Details #1::        letter done and ready for pick up.Patient notified.  Additional Follow-up by: Gladis Riffle, RN,  June 10, 2009 12:29 PM

## 2010-06-13 NOTE — Progress Notes (Signed)
Summary: REQUESTS LAB WORK  Phone Note Call from Patient Call back at Home Phone [phone]   Caller: Patient Summary of Call: Patient requests lab work for her hemoglobin; she has been feeling very tired - call-back # [phone]  Initial call taken by: Roney Jaffe,  June 09, 2009 1:30 PM  Follow-up for Phone Call        can do finger stick HGB if sxs persist schedule OV Follow-up by: Birdie Sons MD,  June 09, 2009 3:01 PM  Additional Follow-up for Phone Call Additional follow up Details #1::        Patient notified. appt made for lab and will call back if sxs persisit. Additional Follow-up by: Gladis Riffle, RN,  June 09, 2009 3:11 PM

## 2010-08-10 ENCOUNTER — Ambulatory Visit: Payer: Self-pay | Admitting: Internal Medicine

## 2010-10-03 ENCOUNTER — Other Ambulatory Visit: Payer: Self-pay | Admitting: Internal Medicine

## 2010-12-26 ENCOUNTER — Other Ambulatory Visit: Payer: Self-pay | Admitting: Internal Medicine

## 2010-12-26 DIAGNOSIS — Z1231 Encounter for screening mammogram for malignant neoplasm of breast: Secondary | ICD-10-CM

## 2010-12-31 ENCOUNTER — Ambulatory Visit (HOSPITAL_COMMUNITY)
Admission: EM | Admit: 2010-12-31 | Discharge: 2010-12-31 | Disposition: A | Discharge status: Home / Self Care | Payer: BC Managed Care – PPO | Source: Ambulatory Visit | Attending: Cardiovascular Disease | Admitting: Cardiovascular Disease

## 2010-12-31 ENCOUNTER — Ambulatory Visit (HOSPITAL_COMMUNITY)
Admission: RE | Admit: 2010-12-31 | Discharge: 2010-12-31 | Disposition: A | Discharge status: Home / Self Care | Payer: BC Managed Care – PPO | Source: Ambulatory Visit | Attending: Cardiovascular Disease | Admitting: Cardiovascular Disease

## 2010-12-31 ENCOUNTER — Other Ambulatory Visit (HOSPITAL_COMMUNITY): Payer: Self-pay | Admitting: Cardiovascular Disease

## 2010-12-31 DIAGNOSIS — Z0389 Encounter for observation for other suspected diseases and conditions ruled out: Secondary | ICD-10-CM | POA: Insufficient documentation

## 2010-12-31 DIAGNOSIS — R109 Unspecified abdominal pain: Secondary | ICD-10-CM

## 2010-12-31 DIAGNOSIS — R319 Hematuria, unspecified: Secondary | ICD-10-CM | POA: Insufficient documentation

## 2010-12-31 DIAGNOSIS — N390 Urinary tract infection, site not specified: Secondary | ICD-10-CM | POA: Insufficient documentation

## 2010-12-31 LAB — DIFFERENTIAL
Basophils Absolute: 0 10*3/uL (ref 0.0–0.1)
Basophils Relative: 0 % (ref 0–1)
Lymphocytes Relative: 27 % (ref 12–46)
Neutro Abs: 6.9 10*3/uL (ref 1.7–7.7)
Neutrophils Relative %: 65 % (ref 43–77)

## 2010-12-31 LAB — COMPREHENSIVE METABOLIC PANEL
ALT: 25 U/L (ref 0–35)
AST: 20 U/L (ref 0–37)
CO2: 27 mEq/L (ref 19–32)
Calcium: 10 mg/dL (ref 8.4–10.5)
Creatinine, Ser: 0.54 mg/dL (ref 0.50–1.10)
GFR calc Af Amer: >60 mL/min (ref >60)
GFR calc non Af Amer: >60 mL/min (ref >60)
Glucose, Bld: 111 mg/dL — ABNORMAL HIGH (ref 70–99)
Sodium: 137 mEq/L (ref 135–145)
Total Protein: 7.6 g/dL (ref 6.0–8.3)

## 2010-12-31 LAB — URINALYSIS, ROUTINE W REFLEX MICROSCOPIC
Glucose, UA: NEGATIVE mg/dL
Protein, ur: NEGATIVE mg/dL
Specific Gravity, Urine: 1.011 (ref 1.005–1.030)

## 2010-12-31 LAB — CBC
HCT: 34.6 % — ABNORMAL LOW (ref 36.0–46.0)
Hemoglobin: 11.9 g/dL — ABNORMAL LOW (ref 12.0–15.0)
RBC: 3.9 MIL/uL (ref 3.87–5.11)
WBC: 10.6 10*3/uL — ABNORMAL HIGH (ref 4.0–10.5)

## 2010-12-31 LAB — URINE MICROSCOPIC-ADD ON

## 2011-01-02 ENCOUNTER — Ambulatory Visit (HOSPITAL_COMMUNITY)
Admission: RE | Admit: 2011-01-02 | Discharge: 2011-01-02 | Disposition: A | Discharge status: Home / Self Care | Payer: BC Managed Care – PPO | Source: Ambulatory Visit | Attending: Internal Medicine | Admitting: Internal Medicine

## 2011-01-02 DIAGNOSIS — Z1231 Encounter for screening mammogram for malignant neoplasm of breast: Secondary | ICD-10-CM

## 2011-01-02 LAB — URINE CULTURE: Culture  Setup Time: 201208192158

## 2011-01-03 ENCOUNTER — Other Ambulatory Visit: Payer: Self-pay | Admitting: Cardiovascular Disease

## 2011-01-03 ENCOUNTER — Ambulatory Visit
Admission: RE | Admit: 2011-01-03 | Discharge: 2011-01-03 | Disposition: A | Discharge status: Home / Self Care | Payer: BC Managed Care – PPO | Source: Ambulatory Visit | Attending: Cardiovascular Disease | Admitting: Cardiovascular Disease

## 2011-01-26 ENCOUNTER — Other Ambulatory Visit (INDEPENDENT_AMBULATORY_CARE_PROVIDER_SITE_OTHER): Payer: BC Managed Care – PPO

## 2011-01-26 DIAGNOSIS — Z0000 Encounter for general adult medical examination without abnormal findings: Secondary | ICD-10-CM

## 2011-01-26 DIAGNOSIS — Z23 Encounter for immunization: Secondary | ICD-10-CM

## 2011-01-26 LAB — TSH: TSH: 2 u[IU]/mL (ref 0.35–5.50)

## 2011-01-26 LAB — POCT URINALYSIS DIPSTICK
Blood, UA: NEGATIVE
Ketones, UA: NEGATIVE
Protein, UA: NEGATIVE
Spec Grav, UA: 1.01

## 2011-01-26 LAB — CBC WITH DIFFERENTIAL/PLATELET
Basophils Absolute: 0 10*3/uL (ref 0.0–0.1)
Eosinophils Absolute: 0.4 10*3/uL (ref 0.0–0.7)
HCT: 33.9 % — ABNORMAL LOW (ref 36.0–46.0)
Hemoglobin: 11.4 g/dL — ABNORMAL LOW (ref 12.0–15.0)
Lymphs Abs: 1.8 10*3/uL (ref 0.7–4.0)
MCHC: 33.6 g/dL (ref 30.0–36.0)
MCV: 92 fl (ref 78.0–100.0)
Monocytes Absolute: 0.6 10*3/uL (ref 0.1–1.0)
Monocytes Relative: 9.7 % (ref 3.0–12.0)
Neutro Abs: 3.1 10*3/uL (ref 1.4–7.7)
Platelets: 257 10*3/uL (ref 150.0–400.0)
RDW: 13.7 % (ref 11.5–14.6)

## 2011-01-26 LAB — BASIC METABOLIC PANEL
BUN: 9 mg/dL (ref 6–23)
CO2: 28 mEq/L (ref 19–32)
Chloride: 103 mEq/L (ref 96–112)
Glucose, Bld: 98 mg/dL (ref 70–99)
Potassium: 4.3 mEq/L (ref 3.5–5.1)
Sodium: 136 mEq/L (ref 135–145)

## 2011-01-26 LAB — LIPID PANEL
Cholesterol: 173 mg/dL (ref 0–200)
Triglycerides: 62 mg/dL (ref 0.0–149.0)
VLDL: 12.4 mg/dL (ref 0.0–40.0)

## 2011-01-26 LAB — HEPATIC FUNCTION PANEL
AST: 22 U/L (ref 0–37)
Albumin: 3.5 g/dL (ref 3.5–5.2)
Total Bilirubin: 0.7 mg/dL (ref 0.3–1.2)

## 2011-01-26 NOTE — Progress Notes (Signed)
Addended by: Alfred Levins D on: 01/26/2011 11:33 AM   Modules accepted: Orders

## 2011-02-08 ENCOUNTER — Encounter: Payer: Self-pay | Admitting: Internal Medicine

## 2011-02-09 ENCOUNTER — Ambulatory Visit (INDEPENDENT_AMBULATORY_CARE_PROVIDER_SITE_OTHER): Payer: BC Managed Care – PPO | Admitting: Internal Medicine

## 2011-02-09 ENCOUNTER — Encounter: Payer: Self-pay | Admitting: Internal Medicine

## 2011-02-09 DIAGNOSIS — Z0000 Encounter for general adult medical examination without abnormal findings: Secondary | ICD-10-CM

## 2011-02-09 DIAGNOSIS — D649 Anemia, unspecified: Secondary | ICD-10-CM

## 2011-02-09 DIAGNOSIS — Z23 Encounter for immunization: Secondary | ICD-10-CM

## 2011-02-09 LAB — FERRITIN: Ferritin: 35.3 ng/mL (ref 10.0–291.0)

## 2011-02-09 LAB — IRON: Iron: 88 ug/dL (ref 42–145)

## 2011-02-09 LAB — IBC PANEL
Iron: 88 ug/dL (ref 42–145)
Transferrin: 239.7 mg/dL (ref 212.0–360.0)

## 2011-02-09 NOTE — Progress Notes (Signed)
  Subjective:    Patient ID: Kimberly [name], female    DOB: [birth year], 60 y.o.   MRN: [id number]  HPI  cpx  Past Medical History  Diagnosis Date  . Anemia 2005    hb 11.5  . GERD (gastroesophageal reflux disease)   . Hypertension   . Hypothyroidism     off meds  . DVT (deep venous thrombosis)     2003, coumadin x 6 mon   Past Surgical History  Procedure Date  . Oophorectomy unilateral   . Arthroscopy right knee 03/16/03    reports that she has never smoked. She has never used smokeless tobacco. She reports that she does not drink alcohol or use illicit drugs. family history includes Alcohol abuse in her other and Diabetes in her other. Allergies  Allergen Reactions  . Enoxaparin Sodium     REACTION: itching,swelling    Review of Systems  patient denies chest pain, shortness of breath, orthopnea. Denies lower extremity edema, abdominal pain, change in appetite, change in bowel movements. Patient denies rashes, musculoskeletal complaints. No other specific complaints in a complete review of systems.      Objective:   Physical Exam  Well-developed well-nourished female in no acute distress. HEENT exam atraumatic, normocephalic, extraocular muscles are intact. Neck is supple. No jugular venous distention no thyromegaly. Chest clear to auscultation without increased work of breathing. Cardiac exam S1 and S2 are regular. Abdominal exam active bowel sounds, soft, nontender. Extremities no edema. Neurologic exam she is alert without any motor sensory deficits. Gait is normal       Assessment & Plan:  Well visit. Discussed need for weight loss. She will start exercising regularly.  Ongoing anemia. We'll check iron studies and B12 level. We will call her with the results and instructions. GI screening is up to date.

## 2011-02-13 ENCOUNTER — Telehealth: Payer: Self-pay | Admitting: Internal Medicine

## 2011-02-13 MED ORDER — IRBESARTAN 300 MG PO TABS: 300.0000 mg | ORAL_TABLET | Freq: Every day | ORAL | Status: DC

## 2011-02-13 NOTE — Telephone Encounter (Signed)
Was told to call back for her lab results. Thanks.

## 2011-02-14 NOTE — Telephone Encounter (Signed)
Pt aware of results 

## 2011-07-17 ENCOUNTER — Other Ambulatory Visit: Payer: Self-pay | Admitting: *Deleted

## 2011-07-17 MED ORDER — LEVOTHYROXINE SODIUM 50 MCG PO TABS: 50.0000 ug | ORAL_TABLET | Freq: Every day | ORAL | Status: DC

## 2011-07-18 ENCOUNTER — Ambulatory Visit (INDEPENDENT_AMBULATORY_CARE_PROVIDER_SITE_OTHER): Payer: BC Managed Care – PPO | Admitting: Family

## 2011-07-18 ENCOUNTER — Encounter: Payer: Self-pay | Admitting: Family

## 2011-07-18 VITALS — BP 148/80 | Temp 98.2°F | Wt 166.0 lb

## 2011-07-18 DIAGNOSIS — N390 Urinary tract infection, site not specified: Secondary | ICD-10-CM

## 2011-07-18 LAB — POCT URINALYSIS DIPSTICK
Bilirubin, UA: NEGATIVE
Glucose, UA: NEGATIVE
Spec Grav, UA: 1.02

## 2011-07-18 MED ORDER — SULFAMETHOXAZOLE-TRIMETHOPRIM 800-160 MG PO TABS: 1.0000 | ORAL_TABLET | Freq: Two times a day (BID) | ORAL | Status: AC

## 2011-07-18 MED ORDER — CIPROFLOXACIN HCL 250 MG PO TABS: 500.0000 mg | ORAL_TABLET | Freq: Two times a day (BID) | ORAL | Status: DC

## 2011-07-18 NOTE — Progress Notes (Signed)
Subjective:    Patient ID: Kimberly [name], female    DOB: 10-Nov-1950, 61 y.o.   MRN: [id number]  HPI 61 year old female, nonsmoker, patient of Dr. Cato Mulligan is in today with complaint of urinary frequency, burning with urination and urgency that single and on for one day. She's got concerned because she will be out of the country for a couple weeks. She denies any fever, muscle aches or pain, low back pain or abdominal pain. She has a past medical history of urinary tract infection   Review of Systems  Constitutional: Negative.   HENT: Negative.   Eyes: Negative.   Respiratory: Negative.   Cardiovascular: Negative.   Gastrointestinal: Negative.   Genitourinary: Positive for dysuria, urgency and frequency.  Musculoskeletal: Negative.   Skin: Negative.   Neurological: Negative.   Hematological: Negative.   Psychiatric/Behavioral: Negative.    Past Medical History  Diagnosis Date  . Anemia 2005    hb 11.5  . GERD (gastroesophageal reflux disease)   . Hypertension   . Hypothyroidism     off meds  . DVT (deep venous thrombosis)     2003, coumadin x 6 mon    History   Social History  . Marital Status: Married    Spouse Name: N/A    Number of Children: N/A  . Years of Education: N/A   Occupational History  . Not on file.   Social History Main Topics  . Smoking status: Never Smoker   . Smokeless tobacco: Never Used  . Alcohol Use: No  . Drug Use: No  . Sexually Active: Not on file   Other Topics Concern  . Not on file   Social History Narrative   MarriedNever smokerAlcohol use-noRegular exercise- yes. Walks regularlyBrother-alcoholIllicit drug- noDaily Caffeine use- 2 cups of tea daily    Past Surgical History  Procedure Date  . Oophorectomy unilateral   . Arthroscopy right knee 03/16/03    Family History  Problem Relation Age of Onset  . Diabetes Other   . Alcohol abuse Other     Allergies  Allergen Reactions  . Enoxaparin Sodium     REACTION:  itching,swelling    Current Outpatient Prescriptions on File Prior to Visit  Medication Sig Dispense Refill  . cholecalciferol (VITAMIN D) 1000 UNITS tablet Take 1,000 Units by mouth daily.        . hydrochlorothiazide 25 MG tablet take 1 tablet by mouth once daily as directed  90 tablet  3  . irbesartan (AVAPRO) 300 MG tablet Take 1 tablet (300 mg total) by mouth daily.  90 tablet  3  . levothyroxine (SYNTHROID, LEVOTHROID) 50 MCG tablet Take 1 tablet (50 mcg total) by mouth daily.  90 tablet  3  . Multiple Vitamin (MULTIVITAMIN) tablet Take 1 tablet by mouth daily.        . meloxicam (MOBIC) 15 MG tablet Take 15 mg by mouth daily.          BP 148/80  Temp(Src) 98.2 F (36.8 C) (Oral)  Wt 166 lb (75.297 kg)chart    Objective:   Physical Exam  Constitutional: She is oriented to person, place, and time. She appears well-developed and well-nourished.  Neck: Normal range of motion. Neck supple.  Cardiovascular: Normal rate, regular rhythm and normal heart sounds.   Pulmonary/Chest: Effort normal.  Abdominal: Soft. Bowel sounds are normal.  Musculoskeletal: Normal range of motion.  Neurological: She is alert and oriented to person, place, and time.  Skin: Skin is warm and dry.  Psychiatric: She has a normal mood and affect.          Assessment & Plan:  Assessment: Urinary tract infection, dysuria, urinary frequency  Plan: Bactrim DS 1 tablet twice a day x7 days. Plenty of fluids. Patient to call the office if symptoms worsen or persist number to schedule appearing.

## 2011-07-18 NOTE — Patient Instructions (Signed)

## 2011-09-19 ENCOUNTER — Encounter: Payer: Self-pay | Admitting: Family

## 2011-09-19 ENCOUNTER — Ambulatory Visit (INDEPENDENT_AMBULATORY_CARE_PROVIDER_SITE_OTHER): Payer: BC Managed Care – PPO | Admitting: Family

## 2011-09-19 VITALS — BP 100/64 | Temp 98.6°F | Wt 166.0 lb

## 2011-09-19 DIAGNOSIS — E039 Hypothyroidism, unspecified: Secondary | ICD-10-CM

## 2011-09-19 DIAGNOSIS — R221 Localized swelling, mass and lump, neck: Secondary | ICD-10-CM

## 2011-09-19 DIAGNOSIS — R220 Localized swelling, mass and lump, head: Secondary | ICD-10-CM

## 2011-09-19 LAB — BASIC METABOLIC PANEL
BUN: 16 mg/dL (ref 6–23)
Creatinine, Ser: 0.6 mg/dL (ref 0.4–1.2)
GFR: 109.45 mL/min (ref >60.00)
Glucose, Bld: 109 mg/dL — ABNORMAL HIGH (ref 70–99)

## 2011-09-19 LAB — T4, FREE: Free T4: 0.99 ng/dL (ref 0.60–1.60)

## 2011-09-19 NOTE — Progress Notes (Signed)
Subjective:    Patient ID: Kimberly [name], female    DOB: 1950-06-12, 61 y.o.   MRN: [id number]  HPI A 61 year old [place] female, nonsmoker, patient of Dr. Cato Mulligan is in today with complaints of neck fullness and probable mass times one month. Reports occasionally having some difficulty swallowing and dry mouth. She has a past medical history of hypothyroidism. She has not had her thyroid levels checked in over year. Denies any changes in her skin, hair, nails no weight changes.   Review of Systems  Constitutional: Negative.   HENT: Positive for postnasal drip.        Mass on both sides of her neck and fullness.  Respiratory: Negative.   Cardiovascular: Negative.   Musculoskeletal: Negative.   Skin: Negative.   Neurological: Negative.   Hematological: Negative.   Psychiatric/Behavioral: Negative.    Past Medical History  Diagnosis Date  . Anemia 2005    hb 11.5  . GERD (gastroesophageal reflux disease)   . Hypertension   . Hypothyroidism     off meds  . DVT (deep venous thrombosis)     2003, coumadin x 6 mon    History   Social History  . Marital Status: Married    Spouse Name: N/A    Number of Children: N/A  . Years of Education: N/A   Occupational History  . Not on file.   Social History Main Topics  . Smoking status: Never Smoker   . Smokeless tobacco: Never Used  . Alcohol Use: No  . Drug Use: No  . Sexually Active: Not on file   Other Topics Concern  . Not on file   Social History Narrative   MarriedNever smokerAlcohol use-noRegular exercise- yes. Walks regularlyBrother-alcoholIllicit drug- noDaily Caffeine use- 2 cups of tea daily    Past Surgical History  Procedure Date  . Oophorectomy unilateral   . Arthroscopy right knee 03/16/03    Family History  Problem Relation Age of Onset  . Diabetes Other   . Alcohol abuse Other     Allergies  Allergen Reactions  . Enoxaparin Sodium     REACTION: itching,swelling    Current Outpatient  Prescriptions on File Prior to Visit  Medication Sig Dispense Refill  . cholecalciferol (VITAMIN D) 1000 UNITS tablet Take 1,000 Units by mouth daily.        . hydrochlorothiazide 25 MG tablet take 1 tablet by mouth once daily as directed  90 tablet  3  . irbesartan (AVAPRO) 300 MG tablet Take 1 tablet (300 mg total) by mouth daily.  90 tablet  3  . levothyroxine (SYNTHROID, LEVOTHROID) 50 MCG tablet Take 1 tablet (50 mcg total) by mouth daily.  90 tablet  3  . meloxicam (MOBIC) 15 MG tablet Take 15 mg by mouth daily.        . Multiple Vitamin (MULTIVITAMIN) tablet Take 1 tablet by mouth daily.          BP 100/64  Temp(Src) 98.6 F (37 C) (Oral)  Wt 166 lb (75.297 kg)chart    Objective:   Physical Exam  Constitutional: She is oriented to person, place, and time. She appears well-developed and well-nourished.  HENT:  Right Ear: External ear normal.  Left Ear: External ear normal.  Nose: Nose normal.  Mouth/Throat: Oropharynx is clear and moist.  Neck: Normal range of motion. Neck supple. No thyromegaly present.        Anterior cervical lymph nodes to palpation. No palpable thyromegaly.  Cardiovascular: Normal rate, regular rhythm  and normal heart sounds.   Pulmonary/Chest: Effort normal and breath sounds normal.  Neurological: She is alert and oriented to person, place, and time.  Skin: Skin is warm and dry.  Psychiatric: She has a normal mood and affect.          Assessment & Plan:  Assessment: Lymphadenopathy, Neck mass-possible, post nasal drip, hypothyroidism  Plan: US of the neck soft tissue at patient's request. Labs sent BMP, CBC, TSH sent. Will notify patient pending results. The mass that she is referring to, is most likely a lymph node. It is mobile with no characteristics obtained a malignant lesion.

## 2011-09-24 ENCOUNTER — Ambulatory Visit
Admission: RE | Admit: 2011-09-24 | Discharge: 2011-09-24 | Disposition: A | Discharge status: Home / Self Care | Payer: BC Managed Care – PPO | Source: Ambulatory Visit | Attending: Family | Admitting: Family

## 2011-09-24 ENCOUNTER — Other Ambulatory Visit: Payer: Self-pay | Admitting: Family

## 2011-09-24 ENCOUNTER — Other Ambulatory Visit: Payer: BC Managed Care – PPO

## 2011-09-24 DIAGNOSIS — E079 Disorder of thyroid, unspecified: Secondary | ICD-10-CM

## 2011-09-24 DIAGNOSIS — R220 Localized swelling, mass and lump, head: Secondary | ICD-10-CM

## 2011-09-26 ENCOUNTER — Telehealth: Payer: Self-pay | Admitting: Speech Pathology

## 2011-09-26 NOTE — Telephone Encounter (Signed)
Pt called and was asking for lab results. She said she was called about this already, but her phone connection was bad and she had trouble hearing.  Please call her soon.

## 2011-09-26 NOTE — Telephone Encounter (Signed)
Patient is aware 

## 2011-09-27 ENCOUNTER — Other Ambulatory Visit: Payer: Self-pay | Admitting: Endocrinology

## 2011-09-27 DIAGNOSIS — E041 Nontoxic single thyroid nodule: Secondary | ICD-10-CM

## 2011-10-02 ENCOUNTER — Other Ambulatory Visit: Payer: Self-pay | Admitting: Endocrinology

## 2011-10-02 ENCOUNTER — Ambulatory Visit
Admission: RE | Admit: 2011-10-02 | Discharge: 2011-10-02 | Disposition: A | Discharge status: Home / Self Care | Payer: BC Managed Care – PPO | Source: Ambulatory Visit | Attending: Endocrinology | Admitting: Endocrinology

## 2011-10-02 DIAGNOSIS — E041 Nontoxic single thyroid nodule: Secondary | ICD-10-CM

## 2011-10-10 ENCOUNTER — Ambulatory Visit: Payer: BC Managed Care – PPO | Admitting: Internal Medicine

## 2011-12-06 ENCOUNTER — Other Ambulatory Visit: Payer: Self-pay | Admitting: Internal Medicine

## 2011-12-12 ENCOUNTER — Other Ambulatory Visit: Payer: Self-pay | Admitting: Internal Medicine

## 2011-12-12 DIAGNOSIS — Z1231 Encounter for screening mammogram for malignant neoplasm of breast: Secondary | ICD-10-CM

## 2012-01-03 ENCOUNTER — Other Ambulatory Visit: Payer: Self-pay | Admitting: Internal Medicine

## 2012-01-04 ENCOUNTER — Ambulatory Visit (HOSPITAL_COMMUNITY): Payer: BC Managed Care – PPO

## 2012-01-24 ENCOUNTER — Telehealth: Payer: Self-pay | Admitting: Family Medicine

## 2012-01-24 NOTE — Telephone Encounter (Signed)
Kimberly [name],  Patient states knee was painful yesterday. Today is more painful and swelled - can't walk on it. I put her tomorrow w/Dr. Cato Mulligan at 8:00. Is this ok, or do you think she needs to be seen today? Thanks!

## 2012-01-24 NOTE — Telephone Encounter (Signed)
Nowhere to put her today so that is fine

## 2012-01-25 ENCOUNTER — Ambulatory Visit (INDEPENDENT_AMBULATORY_CARE_PROVIDER_SITE_OTHER): Payer: BC Managed Care – PPO | Admitting: Internal Medicine

## 2012-01-25 ENCOUNTER — Encounter: Payer: Self-pay | Admitting: Internal Medicine

## 2012-01-25 VITALS — BP 118/80 | Temp 98.0°F | Wt 164.0 lb

## 2012-01-25 DIAGNOSIS — M25569 Pain in unspecified knee: Secondary | ICD-10-CM

## 2012-01-25 NOTE — Progress Notes (Signed)
Patient ID: Kimberly [name], female   DOB: 1950/08/01, 61 y.o.   MRN: [id number] L knee pain for 4 days-- started after "working hard" -- planning a party No redness but there has been some swelling- feels some better today.  Reviewed meds pmh Past soc hx   Well-developed well-nourished female in no acute distress. HEENT exam atraumatic, normocephalic, extraocular muscles are intact. Neck is supple.   A/P- knee pain- likely traumatic with effusion Ok to use meloxicam Ice after exercise.

## 2012-02-06 ENCOUNTER — Ambulatory Visit (HOSPITAL_COMMUNITY)
Admission: RE | Admit: 2012-02-06 | Discharge: 2012-02-06 | Disposition: A | Discharge status: Home / Self Care | Payer: BC Managed Care – PPO | Source: Ambulatory Visit | Attending: Internal Medicine | Admitting: Internal Medicine

## 2012-02-06 DIAGNOSIS — Z1231 Encounter for screening mammogram for malignant neoplasm of breast: Secondary | ICD-10-CM | POA: Insufficient documentation

## 2012-03-06 ENCOUNTER — Other Ambulatory Visit (INDEPENDENT_AMBULATORY_CARE_PROVIDER_SITE_OTHER): Payer: BC Managed Care – PPO

## 2012-03-06 DIAGNOSIS — Z0000 Encounter for general adult medical examination without abnormal findings: Secondary | ICD-10-CM

## 2012-03-06 LAB — CBC WITH DIFFERENTIAL/PLATELET
Basophils Absolute: 0 10*3/uL (ref 0.0–0.1)
Eosinophils Absolute: 0.1 10*3/uL (ref 0.0–0.7)
Hemoglobin: 11.9 g/dL — ABNORMAL LOW (ref 12.0–15.0)
Lymphocytes Relative: 37.1 % (ref 12.0–46.0)
Monocytes Relative: 8.9 % (ref 3.0–12.0)
Neutro Abs: 2.5 10*3/uL (ref 1.4–7.7)
Neutrophils Relative %: 51.8 % (ref 43.0–77.0)
RDW: 13.3 % (ref 11.5–14.6)

## 2012-03-06 LAB — POCT URINALYSIS DIPSTICK
Glucose, UA: NEGATIVE
Ketones, UA: NEGATIVE
Leukocytes, UA: NEGATIVE
Spec Grav, UA: 1.02

## 2012-03-07 LAB — BASIC METABOLIC PANEL
Calcium: 9.3 mg/dL (ref 8.4–10.5)
Creatinine, Ser: 0.7 mg/dL (ref 0.4–1.2)
GFR: 96.02 mL/min (ref >60.00)
Glucose, Bld: 99 mg/dL (ref 70–99)
Sodium: 136 mEq/L (ref 135–145)

## 2012-03-07 LAB — LIPID PANEL
Cholesterol: 175 mg/dL (ref 0–200)
HDL: 43.3 mg/dL (ref >39.00)
LDL Cholesterol: 120 mg/dL — ABNORMAL HIGH (ref 0–99)
Triglycerides: 58 mg/dL (ref 0.0–149.0)
VLDL: 11.6 mg/dL (ref 0.0–40.0)

## 2012-03-07 LAB — HEPATIC FUNCTION PANEL
AST: 26 U/L (ref 0–37)
Albumin: 3.5 g/dL (ref 3.5–5.2)
Alkaline Phosphatase: 56 U/L (ref 39–117)
Bilirubin, Direct: 0.2 mg/dL (ref 0.0–0.3)
Total Bilirubin: 1.2 mg/dL (ref 0.3–1.2)

## 2012-03-18 ENCOUNTER — Telehealth: Payer: Self-pay | Admitting: Internal Medicine

## 2012-03-18 NOTE — Telephone Encounter (Signed)
Kimberly [name], please see below. I believe she was returning your call about resched tomorrow's appt. Please try to call her back. Thanks!

## 2012-03-18 NOTE — Telephone Encounter (Signed)
Can come in 11/25, 11/26, or 11/27 at 9:30

## 2012-03-18 NOTE — Telephone Encounter (Signed)
Pt was sched to come in for cpx on 03/19/12 but was having to be rsch due to pcp out of office. Pt is req a work in cpx with pcp on 11/25,/11/26, or 04/09/12 or 04/11/12. Pls advise. Pt will be leaving to go to [place] after that and won't be back for a while.

## 2012-03-18 NOTE — Telephone Encounter (Signed)
Caller: Shante/Patient; Patient Name: Kimberly [name]; PCP: [facility]ie [facility] (Adults only); Best Callback Phone Number: [phone] [name] states she missed a call from the office but a voicemail was not left.  No notes in EPIC.  States she has an appt tomorrow at [facility].  Please f/u with pt if needed.

## 2012-03-19 ENCOUNTER — Encounter: Payer: BC Managed Care – PPO | Admitting: Internal Medicine

## 2012-03-19 NOTE — Telephone Encounter (Signed)
Called pt and sch cpx for 04/08/12 at 9:30am as noted.

## 2012-03-20 ENCOUNTER — Other Ambulatory Visit: Payer: Self-pay | Admitting: Internal Medicine

## 2012-03-31 ENCOUNTER — Ambulatory Visit: Payer: BC Managed Care – PPO | Admitting: Internal Medicine

## 2012-04-08 ENCOUNTER — Encounter: Payer: Self-pay | Admitting: Internal Medicine

## 2012-04-08 ENCOUNTER — Ambulatory Visit (INDEPENDENT_AMBULATORY_CARE_PROVIDER_SITE_OTHER): Payer: BC Managed Care – PPO | Admitting: Internal Medicine

## 2012-04-08 VITALS — BP 122/72 | HR 72 | Temp 98.0°F | Ht 61.25 in | Wt 164.0 lb

## 2012-04-08 DIAGNOSIS — R0602 Shortness of breath: Secondary | ICD-10-CM

## 2012-04-08 DIAGNOSIS — E8881 Metabolic syndrome: Secondary | ICD-10-CM | POA: Insufficient documentation

## 2012-04-08 DIAGNOSIS — Z0000 Encounter for general adult medical examination without abnormal findings: Secondary | ICD-10-CM

## 2012-04-08 NOTE — Progress Notes (Signed)
Patient ID: Kimberly [name], female   DOB: [birth year], 61 y.o.   MRN: [id number]  cpx  She has frequent lightheadedness with associated low blood pressure.   Past Medical History  Diagnosis Date  . Anemia 2005    hb 11.5  . GERD (gastroesophageal reflux disease)   . Hypertension   . Hypothyroidism     off meds  . DVT (deep venous thrombosis)     2003, coumadin x 6 mon    History   Social History  . Marital Status: Married    Spouse Name: N/A    Number of Children: N/A  . Years of Education: N/A   Occupational History  . Not on file.   Social History Main Topics  . Smoking status: Never Smoker   . Smokeless tobacco: Never Used  . Alcohol Use: No  . Drug Use: No  . Sexually Active: Not on file   Other Topics Concern  . Not on file   Social History Narrative   MarriedNever smokerAlcohol use-noRegular exercise- yes. Walks regularlyBrother-alcoholIllicit drug- noDaily Caffeine use- 2 cups of tea daily    Past Surgical History  Procedure Date  . Oophorectomy unilateral   . Arthroscopy right knee 03/16/03    Family History  Problem Relation Age of Onset  . Diabetes Other   . Alcohol abuse Other     Allergies  Allergen Reactions  . Enoxaparin Sodium     REACTION: itching,swelling    Current Outpatient Prescriptions on File Prior to Visit  Medication Sig Dispense Refill  . calcium carbonate (OS-CAL) 600 MG TABS Take 600 mg by mouth 2 (two) times daily with a meal.      . cholecalciferol (VITAMIN D) 1000 UNITS tablet Take 1,000 Units by mouth daily.        . hydrochlorothiazide (HYDRODIURIL) 25 MG tablet TAKE ONE TABLET BY MOUTH DAILY AS DIRECTED  90 tablet  1  . irbesartan (AVAPRO) 300 MG tablet TAKE 1 TABLET (300 MG TOTAL) BY MOUTH DAILY.  30 tablet  2  . levothyroxine (SYNTHROID, LEVOTHROID) 50 MCG tablet Take 1 tablet (50 mcg total) by mouth daily.  90 tablet  3  . meloxicam (MOBIC) 15 MG tablet take 1 tablet by mouth once daily  30 tablet  0     patient  denies chest pain, shortness of breath, orthopnea. Denies lower extremity edema, abdominal pain, change in appetite, change in bowel movements. Patient denies rashes, musculoskeletal complaints. No other specific complaints in a complete review of systems.   BP 122/72  Pulse 72  Temp 98 F (36.7 C) (Oral)  Ht 5' 1.25" (1.556 m)  Wt 164 lb (74.39 kg)  BMI 30.74 kg/m2  Well-developed well-nourished female in no acute distress. HEENT exam atraumatic, normocephalic, extraocular muscles are intact. Neck is supple. No jugular venous distention no thyromegaly. Chest clear to auscultation without increased work of breathing. Cardiac exam S1 and S2 are regular. Abdominal exam active bowel sounds, soft, nontender. Extremities no edema. Neurologic exam she is alert without any motor sensory deficits. Gait is normal.  A/P- well visit, health maint UTD Her bp is too low by repot (90s/50s)-- will d/c hctz

## 2012-04-18 ENCOUNTER — Telehealth: Payer: Self-pay | Admitting: Internal Medicine

## 2012-04-18 NOTE — Telephone Encounter (Signed)
Pt calling to get an appt due to fluid buildup on knee.  She is leaving for [place] tomorrow and will be gone for 1 month, states she needs to be seen to today.  However, no available appointments with any providers today.  Please advise what pt should do.

## 2012-05-22 ENCOUNTER — Encounter: Payer: Self-pay | Admitting: Internal Medicine

## 2012-05-27 ENCOUNTER — Ambulatory Visit (INDEPENDENT_AMBULATORY_CARE_PROVIDER_SITE_OTHER): Payer: BC Managed Care – PPO | Admitting: Family Medicine

## 2012-05-27 ENCOUNTER — Encounter: Payer: Self-pay | Admitting: Family Medicine

## 2012-05-27 VITALS — BP 130/80 | Temp 98.5°F | Wt 164.0 lb

## 2012-05-27 DIAGNOSIS — M25562 Pain in left knee: Secondary | ICD-10-CM

## 2012-05-27 DIAGNOSIS — M25569 Pain in unspecified knee: Secondary | ICD-10-CM

## 2012-05-27 MED ORDER — TRAMADOL HCL 50 MG PO TABS: ORAL_TABLET | ORAL | Status: DC

## 2012-05-27 NOTE — Progress Notes (Signed)
  Subjective:    Patient ID: Kimberly [name], female    DOB: [birth year], 62 y.o.   MRN: [id number]  HPI Left knee pain. Duration at least 2 months. She was seen for similar issue back in September. No known injury. Achy quality. Constant. Mild edema. Patient walking. No giving way. Meloxicam helps but she is avoiding because of hypertension. Remote history of arthroscopic surgery right knee. No redness. No warmth. No recent x-rays left knee. Exacerbated by changing positions and walking. No alleviating other than meloxicam   Review of Systems  Constitutional: Negative for fever and chills.  Musculoskeletal: Negative for gait problem.       Objective:   Physical Exam  Constitutional: She appears well-developed and well-nourished.  Cardiovascular: Normal rate and regular rhythm.   Pulmonary/Chest: Effort normal and breath sounds normal. No respiratory distress. She has no wheezes. She has no rales.  Musculoskeletal:       Left knee reveals good range of motion. She has moderate crepitus with flexion and extension. Mild effusion. No warmth. No erythema. Medial joint line tenderness. No lateral tenderness. Ligament testing is normal          Assessment & Plan:  Left knee pain. Medial location. Suspect osteoarthritis. Cannot rule out small meniscal tear. Obtain x-rays. Trial of tramadol 50 mg one to 2 every 6 hours as needed.

## 2012-05-27 NOTE — Patient Instructions (Addendum)
Wear and Tear Disorders of the Knee (Arthritis, Osteoarthritis)  Everyone will experience wear and tear injuries (arthritis, osteoarthritis) of the knee. These are the changes we all get as we age. They come from the joint stress of daily living. The amount of cartilage damage in your knee and your symptoms determine if you need surgery. Mild problems require approximately two months recovery time. More severe problems take several months to recover. With mild problems, your surgeon may find worn and rough cartilage surfaces. With severe changes, your surgeon may find cartilage that has completely worn away and exposed the bone. Loose bodies of bone and cartilage, bone spurs (excess bone growth), and injuries to the menisci (cushions between the large bones of your leg) are also common. All of these problems can cause pain.  For a mild wear and tear problem, rough cartilage may simply need to be shaved and smoothed. For more severe problems with areas of exposed bone, your surgeon may use an instrument for roughing up the bone surfaces to stimulate new cartilage growth. Loose bodies are usually removed. Torn menisci may be trimmed or repaired.  ABOUT THE ARTHROSCOPIC PROCEDURE  Arthroscopy is a surgical technique. It allows your orthopedic surgeon to diagnose and treat your knee injury with accuracy. The surgeon looks into your knee through a small scope. The scope is like a small (pencil-sized) telescope. Arthroscopy is less invasive than open knee surgery. You can expect a more rapid recovery. After the procedure, you will be moved to a recovery area until most of the effects of the medication have worn off. Your caregiver will discuss the test results with you.  RECOVERY  The severity of the arthritis and the type of procedure performed will determine recovery time. Other important factors include age, physical condition, medical conditions, and the type of rehabilitation program. Strengthening your muscles after  arthroscopy helps guarantee a better recovery. Follow your caregiver's instructions. Use crutches, rest, elevate, ice, and do knee exercises as instructed. Your caregivers will help you and instruct you with exercises and other physical therapy required to regain your mobility, muscle strength, and functioning following surgery. Only take over-the-counter or prescription medicines for pain, discomfort, or fever as directed by your caregiver.   SEEK MEDICAL CARE IF:    There is increased bleeding (more than a small spot) from the wound.   You notice redness, swelling, or increasing pain in the wound.   Pus is coming from wound.   You develop an unexplained oral temperature above 102 F (38.9 C) , or as your caregiver suggests.   You notice a foul smell coming from the wound or dressing.   You have severe pain with motion of the knee.  SEEK IMMEDIATE MEDICAL CARE IF:    You develop a rash.   You have difficulty breathing.   You have any allergic problems.  MAKE SURE YOU:    Understand these instructions.   Will watch your condition.   Will get help right away if you are not doing well or get worse.  Document Released: 04/27/2000 Document Revised: 07/23/2011 Document Reviewed: 09/24/2007  ExitCare Patient Information 2013 [employer].

## 2012-05-28 ENCOUNTER — Ambulatory Visit (INDEPENDENT_AMBULATORY_CARE_PROVIDER_SITE_OTHER)
Admission: RE | Admit: 2012-05-28 | Discharge: 2012-05-28 | Disposition: A | Discharge status: Home / Self Care | Payer: BC Managed Care – PPO | Source: Ambulatory Visit | Attending: Family Medicine | Admitting: Family Medicine

## 2012-05-28 ENCOUNTER — Telehealth: Payer: Self-pay | Admitting: Internal Medicine

## 2012-05-28 DIAGNOSIS — M25562 Pain in left knee: Secondary | ICD-10-CM

## 2012-05-28 DIAGNOSIS — M25569 Pain in unspecified knee: Secondary | ICD-10-CM

## 2012-05-28 NOTE — Telephone Encounter (Signed)
Pt needs note that says :  I reccomend that Kimberly [name] should continue the excercise program at [facility] to maintain muscle strength. This is important because of her arthritic condition. Thanks! Pls call when ready

## 2012-05-28 NOTE — Progress Notes (Signed)
Quick Note:  Pt informed ______ 

## 2012-05-30 ENCOUNTER — Encounter: Payer: Self-pay | Admitting: Family Medicine

## 2012-05-30 ENCOUNTER — Ambulatory Visit (INDEPENDENT_AMBULATORY_CARE_PROVIDER_SITE_OTHER): Payer: BC Managed Care – PPO | Admitting: Family Medicine

## 2012-05-30 VITALS — BP 120/70 | Temp 98.7°F | Wt 164.0 lb

## 2012-05-30 DIAGNOSIS — M1710 Unilateral primary osteoarthritis, unspecified knee: Secondary | ICD-10-CM

## 2012-05-30 DIAGNOSIS — M179 Osteoarthritis of knee, unspecified: Secondary | ICD-10-CM | POA: Insufficient documentation

## 2012-05-30 DIAGNOSIS — M1712 Unilateral primary osteoarthritis, left knee: Secondary | ICD-10-CM

## 2012-05-30 DIAGNOSIS — IMO0002 Reserved for concepts with insufficient information to code with codable children: Secondary | ICD-10-CM

## 2012-05-30 NOTE — Patient Instructions (Addendum)
Knee Injection  Joint injections are shots. Your caregiver will place a needle into your knee joint. The needle is used to put medicine into the joint. These shots can be used to help treat different painful knee conditions such as osteoarthritis, bursitis, local flare-ups of rheumatoid arthritis, and pseudogout. Anti-inflammatory medicines such as corticosteroids and anesthetics are the most common medicines used for joint and soft tissue injections.   PROCEDURE  · The skin over the kneecap will be cleaned with an antiseptic solution.  · Your caregiver will inject a small amount of a local anesthetic (a medicine like Novocaine) just under the skin in the area that was cleaned.  · After the area becomes numb, a second injection is done. This second injection usually includes an anesthetic and an anti-inflammatory medicine called a steroid or cortisone. The needle is carefully placed in between the kneecap and the knee, and the medicine is injected into the joint space.  · After the injection is done, the needle is removed. Your caregiver may place a bandage over the injection site. The whole procedure takes no more than a couple of minutes.  BEFORE THE PROCEDURE   Wash all of the skin around the entire knee area. Try to remove any loose, scaling skin. There is no other specific preparation necessary unless advised otherwise by your caregiver.  LET YOUR CAREGIVER KNOW ABOUT:   · Allergies.  · Medications taken including herbs, eye drops, over the counter medications, and creams.  · Use of steroids (by mouth or creams).  · Possible pregnancy, if applicable.  · Previous problems with anesthetics or Novocaine.  · History of blood clots (thrombophlebitis).  · History of bleeding or blood problems.  · Previous surgery.  · Other health problems.  RISKS AND COMPLICATIONS  Side effects from cortisone shots are rare. They include:   · Slight bruising of the skin.  · Shrinkage of the normal fatty tissue under the skin where  the shot was given.  · Increase in pain after the shot.  · Infection.  · Weakening of tendons or tendon rupture.  · Allergic reaction to the medicine.  · Diabetics may have a temporary increase in their blood sugar after a shot.  · Cortisone can temporarily weaken the immune system. While receiving these shots, you should not get certain vaccines. Also, avoid contact with anyone who has chickenpox or measles. Especially if you have never had these diseases or have not been previously immunized. Your immune system may not be strong enough to fight off the infection while the cortisone is in your system.  AFTER THE PROCEDURE   · You can go home after the procedure.  · You may need to put ice on the joint 15 to 20 minutes every 3 or 4 hours until the pain goes away.  · You may need to put an elastic bandage on the joint.  HOME CARE INSTRUCTIONS   · Only take over-the-counter or prescription medicines for pain, discomfort, or fever as directed by your caregiver.  · You should avoid stressing the joint. Unless advised otherwise, avoid activities that put a lot of pressure on a knee joint, such as:  · Jogging.  · Bicycling.  · Recreational climbing.  · Hiking.  · Laying down and elevating the leg/knee above the level of your heart can help to minimize swelling.  SEEK MEDICAL CARE IF:   · You have repeated or worsening swelling.  · There is drainage from the puncture area.  ·   You develop red streaking that extends above or below the site where the needle was inserted.  SEEK IMMEDIATE MEDICAL CARE IF:   · You develop a fever.  · You have pain that gets worse even though you are taking pain medicine.  · The area is red and warm, and you have trouble moving the joint.  MAKE SURE YOU:   · Understand these instructions.  · Will watch your condition.  · Will get help right away if you are not doing well or get worse.  Document Released: 07/22/2006 Document Revised: 07/23/2011 Document Reviewed: 04/18/2007  ExitCare® Patient  Information ©2013 [employer].

## 2012-05-30 NOTE — Progress Notes (Signed)
  Subjective:    Patient ID: Kimberly [name], female    DOB: 01-10-1951, 62 y.o.   MRN: [id number]  HPI Followup left knee arthritis. Patient for some reason was not scheduled back with primary today. She's had some ongoing problems and progressive problems left knee pain. No injury. Recent x-ray degenerative changes left knee medial compartment with joint space narrowing, subchondral sclerosis, and spurring. She avoids regular use of nonsteroidal secondary to hypertension. Tried tramadol but had dizziness and no relief of pain.  Had Synvisc injections several years ago without much improvement. She is interested in other options including corticosteroid injection.  Exercises fairly consistently.   Past Medical History  Diagnosis Date  . Anemia 2005    hb 11.5  . GERD (gastroesophageal reflux disease)   . Hypertension   . Hypothyroidism     off meds  . DVT (deep venous thrombosis)     2003, coumadin x 6 mon   Past Surgical History  Procedure Date  . Oophorectomy unilateral   . Arthroscopy right knee 03/16/03    reports that she has never smoked. She has never used smokeless tobacco. She reports that she does not drink alcohol or use illicit drugs. family history includes Alcohol abuse in her other and Diabetes in her other. Allergies  Allergen Reactions  . Enoxaparin Sodium     REACTION: itching,swelling      Review of Systems  Constitutional: Negative for fever and chills.  Musculoskeletal: Positive for arthralgias.       Objective:   Physical Exam  Constitutional: She appears well-developed and well-nourished.  Cardiovascular: Normal rate and regular rhythm.   Pulmonary/Chest: Effort normal and breath sounds normal. No respiratory distress. She has no wheezes. She has no rales.  Musculoskeletal:       Left knee reveals no effusion. Mild crepitus with flexion and extension. Moderate medial joint line tenderness. No ligament instability. No erythema. No ecchymosis. No warmth.            Assessment & Plan:  Osteoarthritis left knee. No improvement with tramadol. Pain is moderate. Patient requesting consideration for corticosteroid injection. We discussed risk and benefits including risk of bleeding, bruising, infection and patient consents. Left knee prepped with Betadine. Using sterile technique injected 40 mg Depo-Medrol and 2 cc of plain Xylocaine using 23-gauge one and one half inch needle. We injected one thumb space inferior and medial to inf pole of patella.  Patient tolerated well. We'll proceed with orthopedic referral

## 2012-06-17 ENCOUNTER — Other Ambulatory Visit: Payer: Self-pay | Admitting: Internal Medicine

## 2012-07-14 ENCOUNTER — Other Ambulatory Visit: Payer: Self-pay | Admitting: Internal Medicine

## 2012-08-27 ENCOUNTER — Other Ambulatory Visit: Payer: Self-pay | Admitting: Internal Medicine

## 2012-09-28 ENCOUNTER — Other Ambulatory Visit: Payer: Self-pay | Admitting: Internal Medicine

## 2012-10-07 ENCOUNTER — Emergency Department (HOSPITAL_COMMUNITY)
Admission: EM | Admit: 2012-10-07 | Discharge: 2012-10-07 | Disposition: A | Discharge status: Home / Self Care | Payer: BC Managed Care – PPO | Attending: Emergency Medicine | Admitting: Emergency Medicine

## 2012-10-07 ENCOUNTER — Encounter (HOSPITAL_COMMUNITY): Payer: Self-pay | Admitting: Adult Health

## 2012-10-07 DIAGNOSIS — G500 Trigeminal neuralgia: Secondary | ICD-10-CM | POA: Insufficient documentation

## 2012-10-07 DIAGNOSIS — Z8719 Personal history of other diseases of the digestive system: Secondary | ICD-10-CM | POA: Insufficient documentation

## 2012-10-07 DIAGNOSIS — E039 Hypothyroidism, unspecified: Secondary | ICD-10-CM | POA: Insufficient documentation

## 2012-10-07 DIAGNOSIS — I10 Essential (primary) hypertension: Secondary | ICD-10-CM | POA: Insufficient documentation

## 2012-10-07 DIAGNOSIS — Z862 Personal history of diseases of the blood and blood-forming organs and certain disorders involving the immune mechanism: Secondary | ICD-10-CM | POA: Insufficient documentation

## 2012-10-07 DIAGNOSIS — Z86718 Personal history of other venous thrombosis and embolism: Secondary | ICD-10-CM | POA: Insufficient documentation

## 2012-10-07 DIAGNOSIS — Z79899 Other long term (current) drug therapy: Secondary | ICD-10-CM | POA: Insufficient documentation

## 2012-10-07 LAB — SEDIMENTATION RATE: Sed Rate: 40 mm/hr — ABNORMAL HIGH (ref 0–22)

## 2012-10-07 LAB — COMPREHENSIVE METABOLIC PANEL
Alkaline Phosphatase: 67 U/L (ref 39–117)
BUN: 14 mg/dL (ref 6–23)
Chloride: 103 mEq/L (ref 96–112)
Creatinine, Ser: 0.55 mg/dL (ref 0.50–1.10)
GFR calc Af Amer: >90 mL/min (ref >90)
Glucose, Bld: 108 mg/dL — ABNORMAL HIGH (ref 70–99)
Potassium: 4 mEq/L (ref 3.5–5.1)
Total Bilirubin: 0.3 mg/dL (ref 0.3–1.2)

## 2012-10-07 LAB — DIFFERENTIAL
Basophils Relative: 0 % (ref 0–1)
Eosinophils Absolute: 0.1 10*3/uL (ref 0.0–0.7)
Lymphs Abs: 2.6 10*3/uL (ref 0.7–4.0)
Monocytes Absolute: 0.5 10*3/uL (ref 0.1–1.0)
Monocytes Relative: 6 % (ref 3–12)
Neutro Abs: 4.1 10*3/uL (ref 1.7–7.7)
Neutrophils Relative %: 56 % (ref 43–77)

## 2012-10-07 LAB — CBC
HCT: 35 % — ABNORMAL LOW (ref 36.0–46.0)
Hemoglobin: 12.2 g/dL (ref 12.0–15.0)
MCH: 30.7 pg (ref 26.0–34.0)
MCHC: 34.9 g/dL (ref 30.0–36.0)
RBC: 3.98 MIL/uL (ref 3.87–5.11)

## 2012-10-07 LAB — PROTIME-INR: INR: 1.01 (ref 0.00–1.49)

## 2012-10-07 NOTE — ED Provider Notes (Signed)
History     CSN: [id number]  Arrival date & time 10/07/12  1737   First MD Initiated Contact with Patient 10/07/12 1822      Chief Complaint  Patient presents with  . Tingling    (Consider location/radiation/quality/duration/timing/severity/associated sxs/prior treatment) HPI Comments: 62 y.o. Female who presents to the Er w/ the cc of tingling and twitching on the left side of her face. Per patient, she has had a "sore tooth", on the upper molar, and it has been hurting a lot over the past 2 days. Pt states that she was manipulating the tooth last night, and she has been told she needs a root canal for it. She states that today she started to have left sided "shooting pain", sharp, stabbing, in the left side of her face, in her forehead, into her left cheek and her lower left jaw. Her husband has not noticed any weakness on her face or facial droop. She is able to ambulate in the Er w/o issues, and states no global HA, she states the symptoms she is having are intermittent. She states "spasms" on the left side of her face as well.   Patient is a 62 y.o. female presenting with general illness. The history is provided by the patient and the spouse.  Illness Severity:  Mild Onset quality:  Gradual Timing:  Constant Progression:  Improving Associated symptoms: no abdominal pain, no chest pain, no congestion, no cough, no diarrhea, no fatigue, no fever, no headaches, no rash, no vomiting and no wheezing     Past Medical History  Diagnosis Date  . Anemia 2005    hb 11.5  . GERD (gastroesophageal reflux disease)   . Hypertension   . Hypothyroidism     off meds  . DVT (deep venous thrombosis)     2003, coumadin x 6 mon    Past Surgical History  Procedure Laterality Date  . Oophorectomy unilateral    . Arthroscopy right knee  03/16/03    Family History  Problem Relation Age of Onset  . Diabetes Other   . Alcohol abuse Other     History  Substance Use Topics  . Smoking  status: Never Smoker   . Smokeless tobacco: Never Used  . Alcohol Use: No    OB History   Grav Para Term Preterm Abortions TAB SAB Ect Mult Living                  Review of Systems  Constitutional: Negative for fever, chills and fatigue.  HENT: Negative for congestion, facial swelling, drooling, neck pain and dental problem.   Eyes: Negative for pain, discharge and itching.  Respiratory: Negative for cough, choking, wheezing and stridor.   Cardiovascular: Negative for chest pain.  Gastrointestinal: Negative for vomiting, abdominal pain and diarrhea.  Endocrine: Negative for cold intolerance and heat intolerance.  Genitourinary: Negative for vaginal discharge, difficulty urinating and vaginal pain.  Skin: Negative for pallor and rash.  Neurological: Negative for dizziness, light-headedness and headaches.  Psychiatric/Behavioral: Negative for behavioral problems and agitation.    Allergies  Enoxaparin sodium  Home Medications   Current Outpatient Rx  Name  Route  Sig  Dispense  Refill  . calcium carbonate (OS-CAL) 600 MG TABS   Oral   Take 600 mg by mouth 2 (two) times daily with a meal.         . cholecalciferol (VITAMIN D) 1000 UNITS tablet   Oral   Take 1,000 Units by mouth daily.           Marland Kitchen  irbesartan (AVAPRO) 300 MG tablet      TAKE ONE TABLET BY MOUTH ONCE DAILY   30 tablet   0   . levothyroxine (SYNTHROID, LEVOTHROID) 50 MCG tablet      TAKE ONE TABLET BY MOUTH ONCE DAILY   90 tablet   0   . meloxicam (MOBIC) 15 MG tablet      TAKE ONE TABLET BY MOUTH DAILY FOR ARTHRITIS PAIN   30 tablet   5     Rx has expired - no refills remain   . traMADol (ULTRAM) 50 MG tablet      1-2 po every 6 hours prn pain   90 tablet   1     BP 136/65  Pulse 74  Temp(Src) 97.8 F (36.6 C) (Oral)  Resp 16  SpO2 98%  Physical Exam  Constitutional: She is oriented to person, place, and time. She appears well-developed. No distress.  HENT:  Head:  Normocephalic and atraumatic.  Pt does not have ttp in her left temple, TA is not palpable. No facial droop.    Eyes: EOM are normal. Pupils are equal, round, and reactive to light. Right eye exhibits no discharge. Left eye exhibits no discharge.  Normal visual acuity, no blurry vision.   Neck: Neck supple. No tracheal deviation present.  Cardiovascular: Normal rate.  Exam reveals no gallop and no friction rub.   Pulmonary/Chest: No stridor. No respiratory distress. She has no wheezes.  Abdominal: Soft. She exhibits no distension. There is no tenderness. There is no rebound.  Musculoskeletal: She exhibits no edema and no tenderness.  Neurological: She is alert and oriented to person, place, and time. No cranial nerve deficit. Coordination normal.  Pt is able to ambulate in the room without issues, normal gait. Heel to toe is normal. 5/5 strength in UE and LE. Romberg is negative. Finger to nose is normal.   Skin: Skin is warm. She is not diaphoretic.    ED Course  Procedures (including critical care time)  Labs Reviewed  CBC - Abnormal; Notable for the following:    HCT 35.0 (*)    All other components within normal limits  COMPREHENSIVE METABOLIC PANEL - Abnormal; Notable for the following:    Glucose, Bld 108 (*)    All other components within normal limits  SEDIMENTATION RATE - Abnormal; Notable for the following:    Sed Rate 40 (*)    All other components within normal limits  PROTIME-INR  APTT  DIFFERENTIAL   No results found.   Date: 10/07/2012  Rate: 69  Rhythm: normal sinus rhythm  QRS Axis: normal  Intervals: normal  ST/T Wave abnormalities: normal  Conduction Disutrbances:none  Narrative Interpretation:   Old EKG Reviewed: unchanged, prior ECG, 04/08/12   MDM  Doubt stroke, doubt TA, doubt TIA -- pt has no neuro deficits. Pt's HPI and physical exam findings are consistent w/ trigeminal neuralgia -- especially with pt's tooth issues recently.  I witnessed pt  having douloureux on exam -- further consistent w/ trigeminal neuralgia.   Pt has no acute neuro deficits on exam -- there is no indication for neuroimaging at this time.   ESR is slightly elevated, but not to the point where there would be concern for TA -- especially in setting of pt not having blurry vision, not having any visual problems.   Repeat neuro exam is done prior to d/c -- her symptoms have completely resolved, and pt states that the superficial pain she is having  on her face comes and goes. She continues to have no neuro deficits in the Er.   She has very good pcp f/u and states she can be seen by them within one day -- I have told family that carbamazepine is a tx for this, but I would defer the start of this to her primary care provider especially in setting of pt's symptoms starting just a few hours ago, and the side effects and close monitoring required for carbamazepine.   1. Trigeminal neuralgia              Bernadene Person, MD 10/07/12 2314

## 2012-10-07 NOTE — ED Notes (Signed)
Pt presents with tingling and twitching feeling of left side of face, no droop, no drift and no weakness. Reports slight headache that is intermittent and described as sharp shooting pain on left side of head at temple. Pt is alert and oriented, answers all questions appropriately. BP 136/65.  The twitching and feeling began at 1500 today and headache began last night at 8 pm.

## 2012-10-07 NOTE — ED Provider Notes (Signed)
I saw and evaluated the patient, reviewed the resident's note and I agree with the findings and plan.  Agree with EKG interpretation if present.   Pt with face tingling and twitching, no focal deficits, likely trigeminal neuralgia. Plan close PCP follow up.   Charles B. Bernette Mayers, MD 10/07/12 [id number]

## 2012-10-13 ENCOUNTER — Other Ambulatory Visit: Payer: Self-pay | Admitting: Internal Medicine

## 2012-11-03 ENCOUNTER — Telehealth: Payer: Self-pay | Admitting: Obstetrics & Gynecology

## 2012-11-03 ENCOUNTER — Encounter: Payer: Self-pay | Admitting: *Deleted

## 2012-11-03 NOTE — Telephone Encounter (Signed)
Spoke with pt about right side pain. Has been having off and on for about 3 days. Pt reports she has only one ovary left, not sure which side. Reports pain is not sharp. Pt tried taking a Tylenol, but it did not help. Scheduled OV tomorrow with SM for 2 pm.

## 2012-11-03 NOTE — Telephone Encounter (Signed)
Pt. Is having rt side pain, please call

## 2012-11-04 ENCOUNTER — Encounter: Payer: Self-pay | Admitting: Obstetrics & Gynecology

## 2012-11-04 ENCOUNTER — Ambulatory Visit (INDEPENDENT_AMBULATORY_CARE_PROVIDER_SITE_OTHER): Payer: BC Managed Care – PPO | Admitting: Obstetrics & Gynecology

## 2012-11-04 VITALS — BP 120/80 | HR 60 | Resp 16 | Ht 60.75 in | Wt 161.8 lb

## 2012-11-04 DIAGNOSIS — R1031 Right lower quadrant pain: Secondary | ICD-10-CM

## 2012-11-04 DIAGNOSIS — G8929 Other chronic pain: Secondary | ICD-10-CM

## 2012-11-04 NOTE — Progress Notes (Signed)
Subjective:     Patient ID: Kimberly [name], female   DOB: [birth year], 62 y.o.   MRN: [id number]  HPI  62 year old G44P2 M [place] female with four day complaint of RLQ pain.  Is colicky in nature.  Does not feel pain today.  A little worried this could be something "real".  Describes pain as feeling like "pulsating blood vessel".   No fever.  No discharge.  No vaginal bleeding.  No nausea/vomiting/diarrhea/diarrhea.  Eating well.  No recent out of the country travel.  Review of Systems  All other systems reviewed and are negative.       Objective:   Physical Exam  Constitutional: She is oriented to person, place, and time. She appears well-developed and well-nourished.  Cardiovascular: Normal rate and regular rhythm.   Pulmonary/Chest: Effort normal and breath sounds normal.  Abdominal: Soft. Bowel sounds are normal. She exhibits no distension and no mass. There is no tenderness. There is no rebound and no guarding.  Genitourinary: Vagina normal and uterus normal.  Musculoskeletal: Normal range of motion.  Neurological: She is alert and oriented to person, place, and time.  Skin: Skin is warm.  Psychiatric: She has a normal mood and affect.       Assessment:     RLQ pain     Plan:     Patient will return for PUS on Monday.  Exam is normal.  If patient has no further pain, she may call and cancel appointment.

## 2012-11-04 NOTE — Patient Instructions (Addendum)
Call if your symptoms worsen

## 2012-11-06 ENCOUNTER — Telehealth: Payer: Self-pay | Admitting: Obstetrics & Gynecology

## 2012-11-06 NOTE — Telephone Encounter (Signed)
LMTCB to discuss insurance benefits and to schedule PUS.

## 2012-11-07 ENCOUNTER — Encounter: Payer: Self-pay | Admitting: Obstetrics & Gynecology

## 2012-11-18 ENCOUNTER — Telehealth: Payer: Self-pay | Admitting: *Deleted

## 2012-11-18 NOTE — Telephone Encounter (Signed)
Call to patient to schedule ultrasound.  LM on cell number.  Home number, female answers and states she is out of town till end of the week and requests we cll her next week to schedule.

## 2013-01-12 ENCOUNTER — Other Ambulatory Visit: Payer: Self-pay | Admitting: Internal Medicine

## 2013-03-17 ENCOUNTER — Ambulatory Visit (INDEPENDENT_AMBULATORY_CARE_PROVIDER_SITE_OTHER): Payer: BC Managed Care – PPO | Admitting: *Deleted

## 2013-03-17 DIAGNOSIS — Z23 Encounter for immunization: Secondary | ICD-10-CM

## 2013-03-22 ENCOUNTER — Other Ambulatory Visit: Payer: Self-pay | Admitting: Internal Medicine

## 2013-03-26 ENCOUNTER — Other Ambulatory Visit: Payer: Self-pay | Admitting: Internal Medicine

## 2013-03-26 DIAGNOSIS — Z1231 Encounter for screening mammogram for malignant neoplasm of breast: Secondary | ICD-10-CM

## 2013-04-07 ENCOUNTER — Other Ambulatory Visit: Payer: Self-pay | Admitting: Internal Medicine

## 2013-04-08 ENCOUNTER — Encounter: Payer: Self-pay | Admitting: Family Medicine

## 2013-04-08 ENCOUNTER — Ambulatory Visit (INDEPENDENT_AMBULATORY_CARE_PROVIDER_SITE_OTHER): Payer: BC Managed Care – PPO | Admitting: Family Medicine

## 2013-04-08 VITALS — BP 124/68 | HR 64 | Temp 97.6°F | Wt 163.0 lb

## 2013-04-08 DIAGNOSIS — M25511 Pain in right shoulder: Secondary | ICD-10-CM

## 2013-04-08 DIAGNOSIS — M25519 Pain in unspecified shoulder: Secondary | ICD-10-CM

## 2013-04-08 MED ORDER — MELOXICAM 15 MG PO TABS: 15.0000 mg | ORAL_TABLET | Freq: Every day | ORAL | Status: DC

## 2013-04-08 MED ORDER — CYCLOBENZAPRINE HCL 5 MG PO TABS: 5.0000 mg | ORAL_TABLET | Freq: Three times a day (TID) | ORAL | Status: DC | PRN

## 2013-04-08 NOTE — Patient Instructions (Signed)
Follow up for any right arm numbness, weakness, or if pain not improving over the next few weeks.

## 2013-04-08 NOTE — Progress Notes (Signed)
   Subjective:    Patient ID: Kimberly [name], female    DOB: 05/18/1950, 62 y.o.   MRN: [id number]  HPI Patient seen with right shoulder pain Onset 1 day ago. Somewhat poorly localized. She has some right trapezius pain but no cervical neck pain. Her pain is mostly confined to the right shoulder region but radiates toward the deltoid but not to the elbow. No known injury. She describes achy quality. Her pain is worse at night. Pain with abduction and internal rotation. She denies upper extremity numbness or weakness. She tried some leftover meloxicam which helped. She also tried some Tylenol.  Past Medical History  Diagnosis Date  . Anemia 2005    hb 11.5  . GERD (gastroesophageal reflux disease)   . Hypertension   . Hypothyroidism     off meds  . DVT (deep venous thrombosis)     2003, coumadin x 6 mon   Past Surgical History  Procedure Laterality Date  . Oophorectomy unilateral    . Arthroscopy right knee  03/16/03    reports that she has never smoked. She has never used smokeless tobacco. She reports that she does not drink alcohol or use illicit drugs. family history includes Alcohol abuse in her other; Diabetes in her mother and other; Heart failure in her father. Allergies  Allergen Reactions  . Enoxaparin Sodium     REACTION: itching,swelling      Review of Systems  Cardiovascular: Negative for chest pain.  Neurological: Negative for weakness and numbness.       Objective:   Physical Exam  Constitutional: She appears well-developed and well-nourished.  Cardiovascular: Normal rate and regular rhythm.   Pulmonary/Chest: Effort normal and breath sounds normal. No respiratory distress. She has no wheezes. She has no rales.  Musculoskeletal: She exhibits no edema.  Patient is full range of motion right shoulder. She does have some right trapezius tenderness. Pain with abduction greater than 80 and pain with internal rotation and external rotation. No a.c. joint tenderness.  No biceps tenderness.  Neurological:  Deep tendon reflexes are symmetric upper extremities. No focal weakness.          Assessment & Plan:  Right shoulder/ upper back pain. Suspect rotator cuff tendinitis. No definite weakness. She also has some right upper back pain which may be some secondary muscle spasm. Refill meloxicam 15 mg once daily. Low-dose cyclobenzaprine 5 mg each bedtime. Gentle range of motion. Consider x-rays and steroid injection if no improvement in the next few weeks

## 2013-04-08 NOTE — Progress Notes (Signed)
Pre visit review using our clinic review tool, if applicable. No additional management support is needed unless otherwise documented below in the visit note. 

## 2013-04-10 ENCOUNTER — Ambulatory Visit (HOSPITAL_COMMUNITY): Payer: BC Managed Care – PPO

## 2013-06-01 NOTE — Telephone Encounter (Signed)
Patient did not return call. To provider for review.

## 2013-06-02 NOTE — Telephone Encounter (Signed)
OK to close encounter. 

## 2013-06-03 ENCOUNTER — Ambulatory Visit (HOSPITAL_COMMUNITY)
Admission: RE | Admit: 2013-06-03 | Discharge: 2013-06-03 | Disposition: A | Discharge status: Home / Self Care | Payer: BC Managed Care – PPO | Source: Ambulatory Visit | Attending: Internal Medicine | Admitting: Internal Medicine

## 2013-06-03 DIAGNOSIS — Z1231 Encounter for screening mammogram for malignant neoplasm of breast: Secondary | ICD-10-CM

## 2013-06-04 ENCOUNTER — Other Ambulatory Visit: Payer: Self-pay | Admitting: Internal Medicine

## 2013-07-07 ENCOUNTER — Other Ambulatory Visit: Payer: Self-pay | Admitting: Internal Medicine

## 2013-08-03 ENCOUNTER — Ambulatory Visit (INDEPENDENT_AMBULATORY_CARE_PROVIDER_SITE_OTHER): Payer: BC Managed Care – PPO | Admitting: Internal Medicine

## 2013-08-03 ENCOUNTER — Ambulatory Visit: Payer: BC Managed Care – PPO | Admitting: Family Medicine

## 2013-08-03 ENCOUNTER — Ambulatory Visit (HOSPITAL_COMMUNITY)
Admission: RE | Admit: 2013-08-03 | Discharge: 2013-08-03 | Disposition: A | Discharge status: Home / Self Care | Payer: BC Managed Care – PPO | Source: Ambulatory Visit | Attending: Internal Medicine | Admitting: Internal Medicine

## 2013-08-03 ENCOUNTER — Encounter: Payer: Self-pay | Admitting: Internal Medicine

## 2013-08-03 VITALS — BP 102/70 | HR 79 | Temp 97.7°F | Ht 60.75 in | Wt 161.0 lb

## 2013-08-03 DIAGNOSIS — R05 Cough: Secondary | ICD-10-CM

## 2013-08-03 DIAGNOSIS — E039 Hypothyroidism, unspecified: Secondary | ICD-10-CM

## 2013-08-03 DIAGNOSIS — I10 Essential (primary) hypertension: Secondary | ICD-10-CM

## 2013-08-03 DIAGNOSIS — R059 Cough, unspecified: Secondary | ICD-10-CM

## 2013-08-03 DIAGNOSIS — R0789 Other chest pain: Secondary | ICD-10-CM | POA: Insufficient documentation

## 2013-08-03 NOTE — Assessment & Plan Note (Signed)
BP is stable.  Monitor electrolytes and kidney function. BP: 102/70 mmHg  Lab Results  Component Value Date   CREATININE 0.55 10/07/2012

## 2013-08-03 NOTE — Assessment & Plan Note (Signed)
Patient needs follow up thyroid function tests.

## 2013-08-03 NOTE — Progress Notes (Signed)
Subjective:    Patient ID: Kimberly [name], female    DOB: [birth year], 63 y.o.   MRN: [id number]  HPI  63 year old female with history of hypertension and hypothyroidism presents with productive cough and nasal congestion for one week. She recently returned from trip to [place], [place]. Patient reports her granddaughter had similar symptoms including fever cough and congestion. Granddaugher diagnosed with possible pneumonia/bronchitis.  Her granddaughter also noted to have high fevers of to 104-105 F.   Patient has low grade fever.  She denies shortness of breath.  Her cough is non productive.  When she blows her nose, she notes yellowish mucus.  Review of Systems    negative for chest pain, negative for shortness of breath Past Medical History  Diagnosis Date  . Anemia 2005    hb 11.5  . GERD (gastroesophageal reflux disease)   . Hypertension   . Hypothyroidism     off meds  . DVT (deep venous thrombosis)     2003, coumadin x 6 mon    History   Social History  . Marital Status: Married    Spouse Name: N/A    Number of Children: N/A  . Years of Education: N/A   Occupational History  . Not on file.   Social History Main Topics  . Smoking status: Never Smoker   . Smokeless tobacco: Never Used  . Alcohol Use: No  . Drug Use: No  . Sexual Activity: Not on file   Other Topics Concern  . Not on file   Social History Narrative   Married   Never smoker   Alcohol use-no   Regular exercise- yes. Walks regularly   Brother-alcohol   Illicit drug- no   Daily Caffeine use- 2 cups of tea daily    Past Surgical History  Procedure Laterality Date  . Oophorectomy unilateral    . Arthroscopy right knee  03/16/03    Family History  Problem Relation Age of Onset  . Diabetes Other   . Alcohol abuse Other   . Diabetes Mother   . Heart failure Father     Allergies  Allergen Reactions  . Enoxaparin Sodium     REACTION: itching,swelling    Current Outpatient  Prescriptions on File Prior to Visit  Medication Sig Dispense Refill  . calcium carbonate (OS-CAL) 600 MG TABS Take 600 mg by mouth 2 (two) times daily with a meal.      . cholecalciferol (VITAMIN D) 1000 UNITS tablet Take 1,000 Units by mouth daily.        . irbesartan (AVAPRO) 300 MG tablet TAKE ONE TABLET BY MOUTH ONCE DAILY  30 tablet  0  . levothyroxine (SYNTHROID, LEVOTHROID) 50 MCG tablet TAKE ONE TABLET BY MOUTH ONCE DAILY  90 tablet  0  . meloxicam (MOBIC) 15 MG tablet Take 1 tablet (15 mg total) by mouth daily.  30 tablet  5   No current facility-administered medications on file prior to visit.    BP 102/70  Pulse 79  Temp(Src) 97.7 F (36.5 C) (Oral)  Ht 5' 0.75" (1.543 m)  Wt 161 lb (73.029 kg)  BMI 30.67 kg/m2  SpO2 98%  LMP 05/14/1998       Objective:   Physical Exam  Constitutional: She is oriented to person, place, and time. She appears well-developed and well-nourished. No distress.  HENT:  Head: Normocephalic and atraumatic.  Mouth/Throat: Oropharynx is clear and moist.  Mild redness and retraction of right and left TM.  Eyes:  EOM are normal. Pupils are equal, round, and reactive to light.  Neck: Neck supple.  No neck tenderness  Cardiovascular: Normal rate, regular rhythm and normal heart sounds.   No murmur heard. Pulmonary/Chest: Effort normal and breath sounds normal. She has no wheezes. She has no rales.  No dullness to percussion  Lymphadenopathy:    She has no cervical adenopathy.  Neurological: She is alert and oriented to person, place, and time. No cranial nerve deficit.  Skin: Skin is warm and dry.  Psychiatric: She has a normal mood and affect. Her behavior is normal.          Assessment & Plan:   Assessment:  1. Cough / URI -  Patient coughing and complains of nasal congestion x 1 week.  Recent travel to [place], [place].  Granddaughter with similar symptoms and diagnosed with possible bronchitis / pneumonia.  Patient's chest exam  is clear.  I suspect cough secondary to post nasal gtt.  She likely has viral URI.  Obtain CXR 2V considering recent travel.  I advised symptomatic treatment for now including using intranasal saline and gargle with warm saltwater. Patient also to try using over-the-counter Mucinex DM as directed.  Patient advised to call office if symptoms persist or worsen.

## 2013-08-03 NOTE — Patient Instructions (Signed)
Gargle with warm salt water 2-3 times per day Use intranasal saline spray as directed Take OTC Mucinex DM or Mucinex DM Fast Max twice daily as needed. Drink plenty of fluids Please contact our office if your symptoms do not improve or gets worse.

## 2013-08-03 NOTE — Progress Notes (Signed)
Pre visit review using our clinic review tool, if applicable. No additional management support is needed unless otherwise documented below in the visit note. 

## 2013-08-04 ENCOUNTER — Telehealth: Payer: Self-pay | Admitting: Internal Medicine

## 2013-08-04 NOTE — Telephone Encounter (Signed)
Pt needs chest xray results. Pt has xray yesterday. Pt saw dr Shawna Orleans

## 2013-08-04 NOTE — Telephone Encounter (Signed)
See result note.  

## 2013-08-05 ENCOUNTER — Ambulatory Visit: Payer: BC Managed Care – PPO | Admitting: Family

## 2013-08-24 ENCOUNTER — Encounter: Payer: Self-pay | Admitting: Obstetrics & Gynecology

## 2013-08-24 ENCOUNTER — Ambulatory Visit (INDEPENDENT_AMBULATORY_CARE_PROVIDER_SITE_OTHER): Payer: BC Managed Care – PPO | Admitting: Obstetrics & Gynecology

## 2013-08-24 ENCOUNTER — Telehealth: Payer: Self-pay | Admitting: Obstetrics & Gynecology

## 2013-08-24 VITALS — BP 126/70 | HR 80 | Resp 20 | Ht 60.75 in | Wt 160.4 lb

## 2013-08-24 DIAGNOSIS — R3129 Other microscopic hematuria: Secondary | ICD-10-CM

## 2013-08-24 DIAGNOSIS — Z124 Encounter for screening for malignant neoplasm of cervix: Secondary | ICD-10-CM

## 2013-08-24 DIAGNOSIS — N950 Postmenopausal bleeding: Secondary | ICD-10-CM

## 2013-08-24 NOTE — Patient Instructions (Signed)

## 2013-08-24 NOTE — Telephone Encounter (Signed)
Patient states she has been spotting for two days.  Requests office visit with Dr. Sabra Heck, as "I have not had any bleeding for 11 years."  Offered patient office visit for this morning and patient agreeable. Scheduled office visit with Dr. Sabra Heck.  Routing to provider for final review. Patient agreeable to disposition. Will close encounter

## 2013-08-24 NOTE — Telephone Encounter (Signed)
Patient calling wanting to schedule an appointment with Dr. Sabra Heck to discuss "post menopausal bleeding and spotting."

## 2013-08-24 NOTE — Progress Notes (Signed)
Subjective:     Patient ID: Kimberly [name], female   DOB: Aug 30, 1950, 63 y.o.   MRN: [id number]  HPI 63 yo [place] female with two episodes of BRVB that started on Friday after intercourse.  Was bright red. She did have some low back pain as well.  Denies dysuria but is unsure if was vaginal or urinary.  Doesn't think it was rectal.  Does not have constipation and denies issues with hemorrhoids.  Patient has second episode of bleeding yesterday while she was up doing things around the house.  It was not heavy.  She called this morning for appt.  Review of Systems  All other systems reviewed and are negative.      Objective:   Physical Exam  Constitutional: She is oriented to person, place, and time. She appears well-developed and well-nourished.  Abdominal: Soft. Bowel sounds are normal. She exhibits no distension and no mass. There is no tenderness. There is no rebound and no guarding.  Genitourinary: Uterus normal. There is no rash, tenderness or lesion on the right labia. There is no rash, tenderness or lesion on the left labia. Right adnexum displays no fullness. Left adnexum displays no fullness. There is bleeding around the vagina.  Bright red blood at cervical os noted.  Lymphadenopathy:       Right: No inguinal adenopathy present.       Left: No inguinal adenopathy present.  Neurological: She is alert and oriented to person, place, and time.  Skin: Skin is warm and dry.  Psychiatric: She has a normal mood and affect.   Endometrial biopsy recommended.  Discussed with patient.  Verbal and written consent obtained.   Procedure:  Speculum placed.  Cervix visualized and cleansed with betadine prep.  A single toothed tenaculum was applied to the anterior lip of the cervix.  Endometrial pipelle was advanced through the cervix into the endometrial cavity without difficulty.  Pipelle passed to 7.0 cm.  Suction applied and pipelle removed with good tissue sample obtained.  Tenculum  removed.  No bleeding noted.  Patient tolerated procedure well.      Assessment:     PMP bleeding Low back pain R/O UTI.  Trace RBCs in dip u/a here.    Plan:     Pap obtained Endometrial biopsy pending Urine culture pending.

## 2013-08-25 LAB — IPS PAP SMEAR ONLY

## 2013-08-25 LAB — URINE CULTURE
Colony Count: NO GROWTH
Organism ID, Bacteria: NO GROWTH

## 2013-08-27 ENCOUNTER — Other Ambulatory Visit: Payer: Self-pay | Admitting: Obstetrics & Gynecology

## 2013-08-27 ENCOUNTER — Other Ambulatory Visit: Payer: BC Managed Care – PPO

## 2013-08-27 ENCOUNTER — Ambulatory Visit (INDEPENDENT_AMBULATORY_CARE_PROVIDER_SITE_OTHER): Payer: BC Managed Care – PPO

## 2013-08-27 ENCOUNTER — Telehealth: Payer: Self-pay | Admitting: Obstetrics & Gynecology

## 2013-08-27 ENCOUNTER — Ambulatory Visit (INDEPENDENT_AMBULATORY_CARE_PROVIDER_SITE_OTHER): Payer: BC Managed Care – PPO | Admitting: Obstetrics & Gynecology

## 2013-08-27 VITALS — BP 136/80 | Ht 60.75 in | Wt 162.0 lb

## 2013-08-27 DIAGNOSIS — D219 Benign neoplasm of connective and other soft tissue, unspecified: Secondary | ICD-10-CM

## 2013-08-27 DIAGNOSIS — N950 Postmenopausal bleeding: Secondary | ICD-10-CM

## 2013-08-27 DIAGNOSIS — N841 Polyp of cervix uteri: Secondary | ICD-10-CM

## 2013-08-27 DIAGNOSIS — D259 Leiomyoma of uterus, unspecified: Secondary | ICD-10-CM

## 2013-08-27 NOTE — Progress Notes (Signed)
63 y.o. Kimberly [name] here for a pelvic ultrasound due to PMP bleeding.  Endometrial biopsy was done 4/13 which showed an endocervical polyp.  O/W biopsy was negative for abnormal cells.  Pt continues to have some bleeding although it is light.  Her daughter is due next week and pt is going to [facility] for a week or so.  She wanted to proceed with evaluation before leaving.  Patient's last menstrual period was 05/14/1998.  Sexually active:  yes  Contraception: PMP state  Technique:  Both transabdominal and transvaginal ultrasound examinations of the pelvis were performed. Transabdominal technique was performed for global imaging of the pelvis including uterus, ovaries, adnexal regions, and pelvic cul-de-sac.  It was necessary to proceed with endovaginal exam following the abdominal ultrasound transabdominal exam to visualize the endometrium and adnexa.  Color and duplex Doppler ultrasound was utilized to evaluate blood flow to the ovaries.  FINDINGS: UTERUS: 7.1 x 3.6 x 4.4cm with multiple small intramural fibroids, largest 1.4cm EMS: 2.32mm ADNEXA:   Left ovary surgically absent   Right ovary 1.1 x 0.8 x 0.6cm, atrophic CUL DE SAC: no free fluid  Sonohysterogram:  Using sterile technique, catheter passed through cervix and sterile saline instilled.  Pt tolerated procedure well.  No filling defects seen including no cervical polyps   Images reviewed with pt.  As there is no obvious polyps, I would like to monitor conservatively and see if bleeding stops.  If it does, I do not think pt needs anything.  If she continues to have some spotting, will proceed with hysteroscopy and D&C.  She will give me an update when she returns from [place].  Pt voices understanding and is in agreement with plan.  Assessment:  PMP bleeding, cervical polyp on endometrial biopsy Plan: Follow conservatively.  If bleeding stops, will not do any further procedures for this issue.  If has trouble with continued  spotting, will proceed with hysteroscopy and D&C.  ~15 minutes spent with patient >50% of time was in face to face discussion of above.

## 2013-08-27 NOTE — Telephone Encounter (Signed)
Spoke with patient. Advised preliminary from biopsy is back and it appears normal. Advised would have Dr.Miller review further and I will call back if she has any more instructions. Patient would like Dr.Miller to know that she is still experiencing the same symptoms as when she came in for 4/13 visit. Patient states she had light spotting on Monday, no spotting on Tuesday, and then spotting again on Wednesday. Patient states it is bright red in color. Still having pain in lower back. No spotting noted today. Denies abdominal/pelvic pain and any urinary symptoms. Patient would like to know what she needs to do now to proceed with care.Advised would let Dr.Miller know and call with any further instructions.

## 2013-08-27 NOTE — Telephone Encounter (Signed)
Patient is calling to see if there is any results back from the biposy.

## 2013-08-28 NOTE — Telephone Encounter (Signed)
Late entry for 08/27/13 at 1147 am. Spoke with patient. Advised that Dr. Sabra Heck has ordered [facility] and can schedule for follow up. Patient agreeable. Will come today at 1300 for procedure. Routing to provider for final review. Patient agreeable to disposition. Will close encounter

## 2013-09-02 ENCOUNTER — Encounter: Payer: Self-pay | Admitting: Obstetrics & Gynecology

## 2013-09-24 ENCOUNTER — Other Ambulatory Visit: Payer: Self-pay | Admitting: Internal Medicine

## 2013-09-30 ENCOUNTER — Other Ambulatory Visit: Payer: Self-pay | Admitting: Internal Medicine

## 2013-10-31 ENCOUNTER — Other Ambulatory Visit: Payer: Self-pay | Admitting: Internal Medicine

## 2013-12-04 ENCOUNTER — Telehealth: Payer: Self-pay | Admitting: Internal Medicine

## 2013-12-04 NOTE — Telephone Encounter (Signed)
Pt has been sch

## 2013-12-04 NOTE — Telephone Encounter (Signed)
Pt would like dr burchette to accept her as new pt. Can I sch?

## 2013-12-04 NOTE — Telephone Encounter (Signed)
OK 

## 2013-12-08 ENCOUNTER — Encounter: Payer: Self-pay | Admitting: Family Medicine

## 2013-12-08 ENCOUNTER — Other Ambulatory Visit: Payer: Self-pay | Admitting: Family Medicine

## 2013-12-08 ENCOUNTER — Ambulatory Visit (INDEPENDENT_AMBULATORY_CARE_PROVIDER_SITE_OTHER): Payer: Medicare Other | Admitting: Family Medicine

## 2013-12-08 VITALS — BP 126/84 | HR 72 | Temp 98.2°F | Ht 60.0 in | Wt 166.0 lb

## 2013-12-08 DIAGNOSIS — Z0000 Encounter for general adult medical examination without abnormal findings: Secondary | ICD-10-CM

## 2013-12-08 DIAGNOSIS — E039 Hypothyroidism, unspecified: Secondary | ICD-10-CM

## 2013-12-08 DIAGNOSIS — I10 Essential (primary) hypertension: Secondary | ICD-10-CM | POA: Diagnosis not present

## 2013-12-08 DIAGNOSIS — E669 Obesity, unspecified: Secondary | ICD-10-CM | POA: Insufficient documentation

## 2013-12-08 DIAGNOSIS — E8881 Metabolic syndrome: Secondary | ICD-10-CM | POA: Diagnosis not present

## 2013-12-08 DIAGNOSIS — R7309 Other abnormal glucose: Secondary | ICD-10-CM | POA: Diagnosis not present

## 2013-12-08 DIAGNOSIS — Z23 Encounter for immunization: Secondary | ICD-10-CM | POA: Diagnosis not present

## 2013-12-08 DIAGNOSIS — E785 Hyperlipidemia, unspecified: Secondary | ICD-10-CM | POA: Diagnosis not present

## 2013-12-08 LAB — LIPID PANEL
CHOLESTEROL: 184 mg/dL (ref 0–200)
HDL: 47.2 mg/dL (ref >39.00)
LDL Cholesterol: 122 mg/dL — ABNORMAL HIGH (ref 0–99)
NonHDL: 136.8
Total CHOL/HDL Ratio: 4
Triglycerides: 74 mg/dL (ref 0.0–149.0)
VLDL: 14.8 mg/dL (ref 0.0–40.0)

## 2013-12-08 LAB — HEPATIC FUNCTION PANEL
ALK PHOS: 64 U/L (ref 39–117)
ALT: 26 U/L (ref 0–35)
AST: 25 U/L (ref 0–37)
Albumin: 3.8 g/dL (ref 3.5–5.2)
BILIRUBIN DIRECT: 0 mg/dL (ref 0.0–0.3)
TOTAL PROTEIN: 7.6 g/dL (ref 6.0–8.3)
Total Bilirubin: 1 mg/dL (ref 0.2–1.2)

## 2013-12-08 LAB — TSH: TSH: 1.78 u[IU]/mL (ref 0.35–4.50)

## 2013-12-08 LAB — BASIC METABOLIC PANEL
BUN: 10 mg/dL (ref 6–23)
CALCIUM: 9.4 mg/dL (ref 8.4–10.5)
CO2: 27 mEq/L (ref 19–32)
Chloride: 104 mEq/L (ref 96–112)
Creatinine, Ser: 0.6 mg/dL (ref 0.4–1.2)
GFR: 115.43 mL/min (ref >60.00)
Glucose, Bld: 112 mg/dL — ABNORMAL HIGH (ref 70–99)
Potassium: 4.1 mEq/L (ref 3.5–5.1)
SODIUM: 136 meq/L (ref 135–145)

## 2013-12-08 MED ORDER — LEVOTHYROXINE SODIUM 50 MCG PO TABS: ORAL_TABLET | ORAL | Status: DC

## 2013-12-08 MED ORDER — IRBESARTAN 150 MG PO TABS: 150.0000 mg | ORAL_TABLET | Freq: Every day | ORAL | Status: DC

## 2013-12-08 NOTE — Patient Instructions (Signed)
Continue yearly flu vaccine We will call you regarding screening colonoscopy

## 2013-12-08 NOTE — Progress Notes (Signed)
Pre visit review using our clinic review tool, if applicable. No additional management support is needed unless otherwise documented below in the visit note. 

## 2013-12-08 NOTE — Progress Notes (Signed)
Subjective:    Patient ID: Kimberly [name], female    DOB: 1951/05/14, 63 y.o.   MRN: [id number]  HPI Patient here for Medicare wellness exam and medical followup. She has history of ?metabolic syndrome, hypertension, GERD, hypothyroidism, osteoarthritis- mostly involving knees. She sees gynecologist regularly. Had colonoscopy 2005. No history of pneumonia vaccine. Overdue for labs. Medications reviewed. Blood pressure stable. No dizziness. Compliant with medications. No sode effects.  No hx of CAD or PVD.  Past Medical History  Diagnosis Date  . Anemia 2005    hb 11.5  . GERD (gastroesophageal reflux disease)   . Hypertension   . Hypothyroidism     off meds  . DVT (deep venous thrombosis)     2003, coumadin x 6 mon   Past Surgical History  Procedure Laterality Date  . Oophorectomy unilateral    . Arthroscopy right knee  03/16/03    reports that she has never smoked. She has never used smokeless tobacco. She reports that she does not drink alcohol or use illicit drugs. family history includes Alcohol abuse in her other; Diabetes in her mother and other; Heart failure in her father. Allergies  Allergen Reactions  . Enoxaparin Sodium     REACTION: itching,swelling   1.  Risk factors based on Past Medical , Social, and Family history reviewed and as indicated above with no changes 2.  Limitations in physical activities None.  No recent falls. 3.  Depression/mood No active depression or anxiety issues 4.  Hearing No defiits 5.  ADLs independent in all. 6.  Cognitive function (orientation to time and place, language, writing, speech,memory) no short or long term memory issues.  Language and judgement intact. 7.  Home Safety no issues 8.  Height, weight, and visual acuity.all stable. 9.  Counseling discussed discussed need for weight loss and regular exercise. 10. Recommendation of preventive services. Prevnar 13. 11. Labs based on risk factors TSH, BMP, Lipids. 12. Care Plan prevnar  13 given.  Repeat colonoscopy. Yearly flu vaccine. Tetanus booster every 10 years. 13. Other Providers Dr Sabra Heck (GYN) 14. Written schedule of screening/prevention services given to patient.     Review of Systems  Constitutional: Negative for fever, activity change, appetite change, fatigue and unexpected weight change.  HENT: Negative for ear pain, hearing loss, sore throat and trouble swallowing.   Eyes: Negative for visual disturbance.  Respiratory: Negative for cough and shortness of breath.   Cardiovascular: Negative for chest pain and palpitations.  Gastrointestinal: Negative for abdominal pain, diarrhea, constipation and blood in stool.  Genitourinary: Negative for dysuria and hematuria.  Musculoskeletal: Positive for arthralgias. Negative for back pain and myalgias.  Skin: Negative for rash.  Neurological: Negative for dizziness, syncope and headaches.  Hematological: Negative for adenopathy.  Psychiatric/Behavioral: Negative for confusion and dysphoric mood.       Objective:   Physical Exam  Constitutional: She is oriented to person, place, and time. She appears well-developed and well-nourished.  HENT:  Head: Normocephalic and atraumatic.  Eyes: EOM are normal. Pupils are equal, round, and reactive to light.  Neck: Normal range of motion. Neck supple. No thyromegaly present.  Cardiovascular: Normal rate, regular rhythm and normal heart sounds.   No murmur heard. Pulmonary/Chest: Breath sounds normal. No respiratory distress. She has no wheezes. She has no rales.  Abdominal: Soft. Bowel sounds are normal. She exhibits no distension and no mass. There is no tenderness. There is no rebound and no guarding.  Genitourinary:  Per gyn  Musculoskeletal: Normal range of motion. She exhibits no edema.  She has some crepitus with flexion and extension of both knees  Lymphadenopathy:    She has no cervical adenopathy.  Neurological: She is alert and oriented to person, place,  and time. She displays normal reflexes. No cranial nerve deficit.  Skin: No rash noted.  Psychiatric: She has a normal mood and affect. Her behavior is normal. Judgment and thought content normal.          Assessment & Plan:  #1 health maintenance. Prevnar 13 vaccine given. Continue yearly flu vaccine. Schedule repeat colonoscopy #2 hypertension. Stable. Continue current blood pressure medication #3 hypothyroidism. Recheck TSH #4 positive family history type 2 diabetes. Check fasting blood sugar

## 2014-01-08 ENCOUNTER — Encounter: Payer: Self-pay | Admitting: Family Medicine

## 2014-01-08 ENCOUNTER — Ambulatory Visit: Payer: Medicare Other | Admitting: Family Medicine

## 2014-01-08 ENCOUNTER — Ambulatory Visit (INDEPENDENT_AMBULATORY_CARE_PROVIDER_SITE_OTHER): Payer: Medicare Other | Admitting: Family Medicine

## 2014-01-08 VITALS — BP 119/72 | HR 78 | Temp 98.1°F | Wt 163.0 lb

## 2014-01-08 DIAGNOSIS — M25561 Pain in right knee: Secondary | ICD-10-CM

## 2014-01-08 DIAGNOSIS — Z1211 Encounter for screening for malignant neoplasm of colon: Secondary | ICD-10-CM

## 2014-01-08 DIAGNOSIS — M1711 Unilateral primary osteoarthritis, right knee: Secondary | ICD-10-CM

## 2014-01-08 DIAGNOSIS — M25569 Pain in unspecified knee: Secondary | ICD-10-CM | POA: Diagnosis not present

## 2014-01-08 DIAGNOSIS — M1710 Unilateral primary osteoarthritis, unspecified knee: Secondary | ICD-10-CM | POA: Diagnosis not present

## 2014-01-08 MED ORDER — METHYLPREDNISOLONE ACETATE 40 MG/ML IJ SUSP
40.0000 mg | Freq: Once | INTRAMUSCULAR | Status: AC
  Administered 2014-01-08: 40 mg via INTRA_ARTICULAR

## 2014-01-08 MED ORDER — IRBESARTAN 150 MG PO TABS: 150.0000 mg | ORAL_TABLET | Freq: Every day | ORAL | Status: DC

## 2014-01-08 NOTE — Patient Instructions (Signed)
Knee Injection Joint injections are shots. Your caregiver will place a needle into your knee joint. The needle is used to put medicine into the joint. These shots can be used to help treat different painful knee conditions such as osteoarthritis, bursitis, local flare-ups of rheumatoid arthritis, and pseudogout. Anti-inflammatory medicines such as corticosteroids and anesthetics are the most common medicines used for joint and soft tissue injections.  PROCEDURE  The skin over the kneecap will be cleaned with an antiseptic solution.  Your caregiver will inject a small amount of a local anesthetic (a medicine like Novocaine) just under the skin in the area that was cleaned.  After the area becomes numb, a second injection is done. This second injection usually includes an anesthetic and an anti-inflammatory medicine called a steroid or cortisone. The needle is carefully placed in between the kneecap and the knee, and the medicine is injected into the joint space.  After the injection is done, the needle is removed. Your caregiver may place a bandage over the injection site. The whole procedure takes no more than a couple of minutes. BEFORE THE PROCEDURE  Wash all of the skin around the entire knee area. Try to remove any loose, scaling skin. There is no other specific preparation necessary unless advised otherwise by your caregiver. LET YOUR CAREGIVER KNOW ABOUT:   Allergies.  Medications taken including herbs, eye drops, over the counter medications, and creams.  Use of steroids (by mouth or creams).  Possible pregnancy, if applicable.  Previous problems with anesthetics or Novocaine.  History of blood clots (thrombophlebitis).  History of bleeding or blood problems.  Previous surgery.  Other health problems. RISKS AND COMPLICATIONS Side effects from cortisone shots are rare. They include:   Slight bruising of the skin.  Shrinkage of the normal fatty tissue under the skin where  the shot was given.  Increase in pain after the shot.  Infection.  Weakening of tendons or tendon rupture.  Allergic reaction to the medicine.  Diabetics may have a temporary increase in their blood sugar after a shot.  Cortisone can temporarily weaken the immune system. While receiving these shots, you should not get certain vaccines. Also, avoid contact with anyone who has chickenpox or measles. Especially if you have never had these diseases or have not been previously immunized. Your immune system may not be strong enough to fight off the infection while the cortisone is in your system. AFTER THE PROCEDURE   You can go home after the procedure.  You may need to put ice on the joint 15-20 minutes every 3 or 4 hours until the pain goes away.  You may need to put an elastic bandage on the joint. HOME CARE INSTRUCTIONS   Only take over-the-counter or prescription medicines for pain, discomfort, or fever as directed by your caregiver.  You should avoid stressing the joint. Unless advised otherwise, avoid activities that put a lot of pressure on a knee joint, such as:  Jogging.  Bicycling.  Recreational climbing.  Hiking.  Laying down and elevating the leg/knee above the level of your heart can help to minimize swelling. SEEK MEDICAL CARE IF:   You have repeated or worsening swelling.  There is drainage from the puncture area.  You develop red streaking that extends above or below the site where the needle was inserted. SEEK IMMEDIATE MEDICAL CARE IF:   You develop a fever.  You have pain that gets worse even though you are taking pain medicine.  The area is   red and warm, and you have trouble moving the joint. MAKE SURE YOU:   Understand these instructions.  Will watch your condition.  Will get help right away if you are not doing well or get worse. Document Released: 07/22/2006 Document Revised: 07/23/2011 Document Reviewed: 04/18/2007 [employer] Patient  Information 2015 [employer]. This information is not intended to replace advice given to you by your health care provider. Make sure you discuss any questions you have with your health care provider.  

## 2014-01-08 NOTE — Progress Notes (Signed)
   Subjective:    Patient ID: Kimberly [name], female    DOB: 1950/09/09, 63 y.o.   MRN: [id number]  Knee Pain    Patient here with right knee pain. Hurting for the past month. No specific injury. Remote arthroscopic surgery 10 years ago. She has known osteoarthritis. Previously, we injected her left knee and she got good relief for many months. She is requesting the same for the right. No swelling. Early-morning stiffness. Pain with walking and standing. Pain is mostly medial. Not relieved with over the counter medications.  Past Medical History  Diagnosis Date  . Anemia 2005    hb 11.5  . GERD (gastroesophageal reflux disease)   . Hypertension   . Hypothyroidism     off meds  . DVT (deep venous thrombosis)     2003, coumadin x 6 mon   Past Surgical History  Procedure Laterality Date  . Oophorectomy unilateral    . Arthroscopy right knee  03/16/03    reports that she has never smoked. She has never used smokeless tobacco. She reports that she does not drink alcohol or use illicit drugs. family history includes Alcohol abuse in her other; Diabetes in her mother and other; Heart failure in her father. Allergies  Allergen Reactions  . Enoxaparin Sodium     REACTION: itching,swelling      Review of Systems  Musculoskeletal: Positive for arthralgias.       Objective:   Physical Exam  Constitutional: She appears well-developed and well-nourished.  Cardiovascular: Normal rate and regular rhythm.   Pulmonary/Chest: Effort normal and breath sounds normal. No respiratory distress. She has no wheezes. She has no rales.  Musculoskeletal:  Right knee reveals good range of motion but significant crepitus with flexion-extension. No warmth. No erythema. Moderate medial joint line tenderness. No effusion          Assessment & Plan:  Osteoarthritis right knee. Discussed risk and benefits of corticosteroid injection and pt consents.  She has had favorable response with left knee in  past.  Right knee prepped with betadine.  Using sterile technique injected 40 mg depomedrol and 2 cc plain xylocaine using 22 gauge one and one half inch needle.  Pt tolerated well.

## 2014-01-08 NOTE — Progress Notes (Signed)
Pre visit review using our clinic review tool, if applicable. No additional management support is needed unless otherwise documented below in the visit note. 

## 2014-01-11 DIAGNOSIS — E039 Hypothyroidism, unspecified: Secondary | ICD-10-CM | POA: Diagnosis not present

## 2014-01-11 DIAGNOSIS — E049 Nontoxic goiter, unspecified: Secondary | ICD-10-CM | POA: Diagnosis not present

## 2014-01-11 DIAGNOSIS — R7301 Impaired fasting glucose: Secondary | ICD-10-CM | POA: Diagnosis not present

## 2014-01-12 ENCOUNTER — Encounter: Payer: Self-pay | Admitting: Family Medicine

## 2014-02-16 ENCOUNTER — Encounter: Payer: Self-pay | Admitting: Internal Medicine

## 2014-03-15 ENCOUNTER — Encounter: Payer: Self-pay | Admitting: Family Medicine

## 2014-04-01 DIAGNOSIS — Z23 Encounter for immunization: Secondary | ICD-10-CM | POA: Diagnosis not present

## 2014-04-06 ENCOUNTER — Encounter: Payer: Self-pay | Admitting: Family Medicine

## 2014-04-06 ENCOUNTER — Ambulatory Visit (INDEPENDENT_AMBULATORY_CARE_PROVIDER_SITE_OTHER): Payer: Medicare Other | Admitting: Family Medicine

## 2014-04-06 VITALS — BP 140/80 | HR 72 | Temp 97.7°F | Wt 159.0 lb

## 2014-04-06 DIAGNOSIS — R059 Cough, unspecified: Secondary | ICD-10-CM

## 2014-04-06 DIAGNOSIS — R05 Cough: Secondary | ICD-10-CM | POA: Diagnosis not present

## 2014-04-06 MED ORDER — ALBUTEROL SULFATE HFA 108 (90 BASE) MCG/ACT IN AERS: 2.0000 | INHALATION_SPRAY | Freq: Four times a day (QID) | RESPIRATORY_TRACT | Status: DC | PRN

## 2014-04-06 MED ORDER — MELOXICAM 15 MG PO TABS: 15.0000 mg | ORAL_TABLET | Freq: Every day | ORAL | Status: DC

## 2014-04-06 NOTE — Patient Instructions (Signed)
Take inhaler 2 puffs about 30 minutes to one hour prior to water exercises Also consider over the counter Allegra one daily for allergy symptoms.

## 2014-04-06 NOTE — Progress Notes (Signed)
Pre visit review using our clinic review tool, if applicable. No additional management support is needed unless otherwise documented below in the visit note. 

## 2014-04-06 NOTE — Progress Notes (Signed)
   Subjective:    Patient ID: Kimberly [name], female    DOB: 05-May-1951, 63 y.o.   MRN: [id number]  HPI Patient seen with some possible irritation with chlorine. She recently started water aerobic exercises and does notice occasional cough and possibly small wheezing and has occurred only when she has been exposed to chlorine. She sometimes has some mild shortness of breath. Her husband had albuterol inhaler and she uses once in the seem to help. She's not had any nasal congestion or sneezing. No history of asthma. No skin hives or any angioedema type symptoms. No recent exertional chest pain.  Past Medical History  Diagnosis Date  . Anemia 2005    hb 11.5  . GERD (gastroesophageal reflux disease)   . Hypertension   . Hypothyroidism     off meds  . DVT (deep venous thrombosis)     2003, coumadin x 6 mon   Past Surgical History  Procedure Laterality Date  . Oophorectomy unilateral    . Arthroscopy right knee  03/16/03    reports that she has never smoked. She has never used smokeless tobacco. She reports that she does not drink alcohol or use illicit drugs. family history includes Alcohol abuse in her other; Diabetes in her mother and other; Heart failure in her father. Allergies  Allergen Reactions  . Enoxaparin Sodium     REACTION: itching,swelling      Review of Systems  Constitutional: Negative for fever and chills.  Respiratory: Positive for cough and shortness of breath.   Cardiovascular: Negative for chest pain, palpitations and leg swelling.       Objective:   Physical Exam  Constitutional: She appears well-developed and well-nourished.  Neck: Neck supple. No thyromegaly present.  Cardiovascular: Normal rate and regular rhythm.   Pulmonary/Chest: Effort normal and breath sounds normal. No respiratory distress. She has no wheezes. She has no rales.          Assessment & Plan:  Probable irritant cough. She may have some mild reactive airway symptoms by  description but no normal exam today. Albuterol inhaler 2 puffs 30 minutes to 1 hour prior to exercise. Follow-up if this is not helping

## 2014-04-07 ENCOUNTER — Telehealth: Payer: Self-pay | Admitting: Internal Medicine

## 2014-04-07 ENCOUNTER — Other Ambulatory Visit: Payer: Self-pay | Admitting: Family Medicine

## 2014-04-07 DIAGNOSIS — K219 Gastro-esophageal reflux disease without esophagitis: Secondary | ICD-10-CM

## 2014-04-07 NOTE — Telephone Encounter (Signed)
Referral is ordered

## 2014-04-07 NOTE — Telephone Encounter (Signed)
Pt would like a referral to dr Collene Mares for colonoscopy . Pt has medicare and [employer] plan f

## 2014-04-07 NOTE — Telephone Encounter (Signed)
OK 

## 2014-04-15 DIAGNOSIS — I10 Essential (primary) hypertension: Secondary | ICD-10-CM | POA: Diagnosis not present

## 2014-04-15 DIAGNOSIS — E049 Nontoxic goiter, unspecified: Secondary | ICD-10-CM | POA: Diagnosis not present

## 2014-04-15 DIAGNOSIS — E039 Hypothyroidism, unspecified: Secondary | ICD-10-CM | POA: Diagnosis not present

## 2014-04-15 DIAGNOSIS — R7301 Impaired fasting glucose: Secondary | ICD-10-CM | POA: Diagnosis not present

## 2014-05-14 HISTORY — PX: COLONOSCOPY: SHX174

## 2014-06-10 DIAGNOSIS — Z1211 Encounter for screening for malignant neoplasm of colon: Secondary | ICD-10-CM | POA: Diagnosis not present

## 2014-07-02 ENCOUNTER — Encounter: Payer: Self-pay | Admitting: Family Medicine

## 2014-07-02 ENCOUNTER — Ambulatory Visit (INDEPENDENT_AMBULATORY_CARE_PROVIDER_SITE_OTHER): Payer: Medicare Other | Admitting: Family Medicine

## 2014-07-02 VITALS — BP 128/78 | HR 82 | Temp 97.6°F | Wt 163.0 lb

## 2014-07-02 DIAGNOSIS — M5416 Radiculopathy, lumbar region: Secondary | ICD-10-CM

## 2014-07-02 NOTE — Progress Notes (Signed)
Pre visit review using our clinic review tool, if applicable. No additional management support is needed unless otherwise documented below in the visit note. 

## 2014-07-02 NOTE — Progress Notes (Signed)
Subjective:    Patient ID: Kimberly [name], female    DOB: 1951-01-12, 64 y.o.   MRN: [id number]  Back Pain  Back Pain Radiating Down to Leg Patient seen with complaint of lower back pain (L4-L5) left side that radiates down to superior calf region.symptoms began 3 days ago. Rates pain 8/10 in severity.  Pain resolved with taking Meloxicam for inflammation. No exacerbating factors.  No loss of bladder or bowel control. No fever or chills.  NKI.  No weakness.  Shortness of breath Complains of  episode of shortness of breath during exercise involving a walk totaling 1 mile in very cold weather. Patient states that episode resolved with rest and she was able to resume walk.   Review of Systems  Constitutional: Negative.   HENT: Negative.   Eyes: Negative.   Respiratory:       See HPI  Cardiovascular: Negative.   Gastrointestinal: Negative.   Endocrine: Negative.   Genitourinary: Negative.   Musculoskeletal: Positive for back pain.       Radiated to lower left leg. See HPI  Neurological: Negative.   Hematological: Negative.   Psychiatric/Behavioral: Negative.       . Past Medical History  Diagnosis Date  . Anemia 2005    hb 11.5  . GERD (gastroesophageal reflux disease)   . Hypertension   . Hypothyroidism     off meds  . DVT (deep venous thrombosis)     2003, coumadin x 6 mon   Past Surgical History  Procedure Laterality Date  . Oophorectomy unilateral    . Arthroscopy right knee  03/16/03    reports that she has never smoked. She has never used smokeless tobacco. She reports that she does not drink alcohol or use illicit drugs. family history includes Alcohol abuse in her other; Diabetes in her mother and other; Heart failure in her father. Allergies  Allergen Reactions  . Enoxaparin Sodium     REACTION: itching,swelling   Objective:   Physical Exam  Constitutional: She is oriented to person, place, and time. She appears well-developed and well-nourished.  HENT:    Head: Normocephalic.  Right Ear: External ear normal.  Left Ear: External ear normal.  Nose: Nose normal.  Eyes: Conjunctivae and EOM are normal. Pupils are equal, round, and reactive to light.  Neck: Normal range of motion. Neck supple.  Cardiovascular: Normal rate, regular rhythm, normal heart sounds and intact distal pulses.   Bilateral pedal pulses palpable +2. Color bilateral symmetrical. No evidence of circulatory insufficiency. Bilateral extremities absent of edema.   Pulmonary/Chest: Effort normal and breath sounds normal. She has no wheezes. She has no rales.  Musculoskeletal: She exhibits edema. She exhibits no tenderness.  Cervical spine midline, absent of crepitus with flexion. Negative for pain with percussion. Full ROM of lower extremities performed. Absent of pain or crepitus with activity. Gait is intact.  Neurological: She is alert and oriented to person, place, and time. She has normal reflexes.  DTRs lower extremities brisk +2.   Skin: Skin is warm and dry.  Psychiatric: She has a normal mood and affect. Her behavior is normal. Judgment and thought content normal.          Assessment & Plan:  Probable left lumbar radiculopathy.  Patient has a history of knee pain and now complains of pain in lower lumbar region which radiates. Non-focal neuro exam.  Condition improved with Meloxicam.  Plan: 1. Left lumbar Radiculopathy: Continue with Meloxicam. Take Zantac or omeprazole for GI  upset. Avoid stooping or bending at waist or any heavy lifting next few days.  Touch base for any worsening or persistent symptoms.

## 2014-07-02 NOTE — Patient Instructions (Signed)
Lower back pain: Continue Meloxicam as prescribed and take with food For GI upset side effect: Take H2 blocker or Proton Pump Inhibitor 30 minutes before Meloxicam.  L Lumbosacral Radiculopathy Lumbosacral radiculopathy is a pinched nerve or nerves in the low back (lumbosacral area). When this happens you may have weakness in your legs and may not be able to stand on your toes. You may have pain going down into your legs. There may be difficulties with walking normally. There are many causes of this problem. Sometimes this may happen from an injury, or simply from arthritis or boney problems. It may also be caused by other illnesses such as diabetes. If there is no improvement after treatment, further studies may be done to find the exact cause. DIAGNOSIS  X-rays may be needed if the problems become long standing. Electromyograms may be done. This study is one in which the working of nerves and muscles is studied. HOME CARE INSTRUCTIONS   Applications of ice packs may be helpful. Ice can be used in a plastic bag with a towel around it to prevent frostbite to skin. This may be used every 2 hours for 20 to 30 minutes, or as needed, while awake, or as directed by your caregiver.  Only take over-the-counter or prescription medicines for pain, discomfort, or fever as directed by your caregiver.  If physical therapy was prescribed, follow your caregiver's directions. SEEK IMMEDIATE MEDICAL CARE IF:   You have pain not controlled with medications.  You seem to be getting worse rather than better.  You develop increasing weakness in your legs.  You develop loss of bowel or bladder control.  You have difficulty with walking or balance, or develop clumsiness in the use of your legs.  You have a fever. MAKE SURE YOU:   Understand these instructions.  Will watch your condition.  Will get help right away if you are not doing well or get worse. Document Released: 04/30/2005 Document Revised:  07/23/2011 Document Reviewed: 12/19/2007 [facility] Patient Information 2015 [place], [place]. This information is not intended to replace advice given to you by your health care provider. Make sure you discuss any questions you have with your health care provider. , contact the office.

## 2014-07-27 DIAGNOSIS — M170 Bilateral primary osteoarthritis of knee: Secondary | ICD-10-CM | POA: Diagnosis not present

## 2014-07-29 ENCOUNTER — Encounter: Payer: Self-pay | Admitting: Family Medicine

## 2014-07-29 ENCOUNTER — Ambulatory Visit (INDEPENDENT_AMBULATORY_CARE_PROVIDER_SITE_OTHER): Payer: Medicare Other | Admitting: Family Medicine

## 2014-07-29 VITALS — BP 132/80 | HR 68 | Temp 97.4°F | Wt 163.0 lb

## 2014-07-29 DIAGNOSIS — E559 Vitamin D deficiency, unspecified: Secondary | ICD-10-CM

## 2014-07-29 DIAGNOSIS — R739 Hyperglycemia, unspecified: Secondary | ICD-10-CM | POA: Diagnosis not present

## 2014-07-29 DIAGNOSIS — Z01818 Encounter for other preprocedural examination: Secondary | ICD-10-CM | POA: Diagnosis not present

## 2014-07-29 DIAGNOSIS — E038 Other specified hypothyroidism: Secondary | ICD-10-CM | POA: Diagnosis not present

## 2014-07-29 DIAGNOSIS — E538 Deficiency of other specified B group vitamins: Secondary | ICD-10-CM | POA: Diagnosis not present

## 2014-07-29 LAB — HEMOGLOBIN A1C: Hgb A1c MFr Bld: 6.1 % (ref 4.6–6.5)

## 2014-07-29 LAB — VITAMIN D 25 HYDROXY (VIT D DEFICIENCY, FRACTURES): VITD: 28.74 ng/mL — ABNORMAL LOW (ref 30.00–100.00)

## 2014-07-29 LAB — TSH: TSH: 1.81 u[IU]/mL (ref 0.35–4.50)

## 2014-07-29 LAB — VITAMIN B12: Vitamin B-12: 345 pg/mL (ref 211–911)

## 2014-07-29 NOTE — Progress Notes (Signed)
Pre visit review using our clinic review tool, if applicable. No additional management support is needed unless otherwise documented below in the visit note. 

## 2014-07-29 NOTE — Progress Notes (Signed)
   Subjective:    Patient ID: Kimberly [name], female    DOB: [birth year], 64 y.o.   MRN: [id number]  HPI Patient here for several issues as follows  Preoperative clearance. She has severe osteoarthritis of both knees. She is planning to get right total knee replacement first. She has no cardiac history. Nonsmoker. No recent chest pains. No chronic pulmonary issues. She does relate remote history of left lower extremity DVT in 2003. No blood clot issues since then.  History of B12 deficiency. She is on replacement. Requesting repeat levels. She also relates history of vitamin D deficiency. She takes over-the-counter replacement 1000 international units daily. Requesting repeat levels. History of mild hyperglycemia. She had hemoglobin A1c reportedly of 6.0% by endocrinologist last fall and was advised to go on metformin. Not clear why they advised metformin with this A1c. Patient has been reluctant to start this. She is requesting repeat A1c today. No polyuria or polydipsia.  Past Medical History  Diagnosis Date  . Anemia 2005    hb 11.5  . GERD (gastroesophageal reflux disease)   . Hypertension   . Hypothyroidism     off meds  . DVT (deep venous thrombosis)     2003, coumadin x 6 mon   Past Surgical History  Procedure Laterality Date  . Oophorectomy unilateral    . Arthroscopy right knee  03/16/03    reports that she has never smoked. She has never used smokeless tobacco. She reports that she does not drink alcohol or use illicit drugs. family history includes Alcohol abuse in her other; Diabetes in her mother and other; Heart failure in her father. Allergies  Allergen Reactions  . Enoxaparin Sodium     REACTION: itching,swelling      Review of Systems  Constitutional: Negative for chills and fatigue.  Eyes: Negative for visual disturbance.  Respiratory: Negative for cough, chest tightness, shortness of breath and wheezing.   Cardiovascular: Negative for chest pain, palpitations  and leg swelling.  Gastrointestinal: Negative for abdominal pain.  Genitourinary: Negative for dysuria.  Musculoskeletal: Positive for arthralgias.  Neurological: Negative for dizziness, seizures, syncope, weakness, light-headedness and headaches.       Objective:   Physical Exam  Constitutional: She is oriented to person, place, and time. She appears well-developed and well-nourished.  Neck: Neck supple. No thyromegaly present.  Cardiovascular: Normal rate and regular rhythm.   Pulmonary/Chest: Effort normal and breath sounds normal. No respiratory distress. She has no wheezes. She has no rales.  Musculoskeletal: She exhibits no edema.  Lymphadenopathy:    She has no cervical adenopathy.  Neurological: She is alert and oriented to person, place, and time.          Assessment & Plan:  #1 presurgical clearance. EKG shows mild sinus bradycardia with rate of 56. No acute ST-T segment changes. She reports remote history of DVT many years ago. Precautions for DVT prevention post surgery #2 history of B12 deficiency. Recheck 12 level #3 history of vitamin D deficiency. Recheck 25-hydroxy vitamin D  # 4 history of mild hyperglycemia. Patient requesting repeat hemoglobin A1c. We've not advised metformin if her A1c is 6.5% or less

## 2014-07-30 DIAGNOSIS — Z1211 Encounter for screening for malignant neoplasm of colon: Secondary | ICD-10-CM | POA: Diagnosis not present

## 2014-08-24 ENCOUNTER — Other Ambulatory Visit: Payer: Self-pay | Admitting: Family Medicine

## 2014-09-01 DIAGNOSIS — M1711 Unilateral primary osteoarthritis, right knee: Secondary | ICD-10-CM | POA: Diagnosis not present

## 2014-09-01 NOTE — Pre-Procedure Instructions (Signed)
Kimberly [name]  09/01/2014   Your procedure is scheduled on: Tuesday, Sep 14, 2014   [facility] to [facility] Admitting at 8:00 AM.  Call this number if you have problems the morning of surgery: [phone]   Remember:   Do not eat food or drink liquids after midnight Monday, Sep 13, 2014   Take these medicines the morning of surgery with A SIP OF WATER: levothyroxine (SYNTHROID, LEVOTHROID)  Stop taking Aspirin, vitamins and herbal medications (Omega-3 Fatty Acids (FISH OIL) and TURMERIC. Do not take any NSAIDs ie: Ibuprofen, Advil, Naproxen or any medication containing Aspirin; stop 1 week prior to procedure ( Tuesday, September 07, 2014)   Do not wear jewelry, make-up or nail polish.  Do not wear lotions, powders, or perfumes. You may not wear deodorant.  Do not shave 48 hours prior to surgery.   Do not bring valuables to the hospital.  [facility] is not responsible for any belongings or valuables.               Contacts, dentures or bridgework may not be worn into surgery.  Leave suitcase in the car. After surgery it may be brought to your room.  For patients admitted to the hospital, discharge time is determined by your treatment team.Special Instructions: [facility] - Preparing for Surgery  Before surgery, you can play an important role.  Because skin is not sterile, your skin needs to be as free of germs as possible.  You can reduce the number of germs on you skin by washing with CHG (chlorahexidine gluconate) soap before surgery.  CHG is an antiseptic cleaner which kills germs and bonds with the skin to continue killing germs even after washing.  Please DO NOT use if you have an allergy to CHG or antibacterial soaps.  If your skin becomes reddened/irritated stop using the CHG and inform your nurse when you arrive at Short Stay.  Do not shave (including legs and underarms) for at least 48 hours prior to the first CHG shower.  You may shave your face.  Please follow these  instructions carefully:   1.  Shower with CHG Soap the night before surgery and the morning of Surgery.  2.  If you choose to wash your hair, wash your hair first as usual with your normal shampoo.  3.  After you shampoo, rinse your hair and body thoroughly to remove the Shampoo.  4.  Use CHG as you would any other liquid soap.  You can apply chg directly  to the skin and wash gently with scrungie or a clean washcloth.  5.  Apply the CHG Soap to your body ONLY FROM THE NECK DOWN.  Do not use on open wounds or open sores.  Avoid contact with your eyes, ears, mouth and genitals (private parts).  Wash genitals (private parts) with your normal soap.  6.  Wash thoroughly, paying special attention to the area where your surgery will be performed.  7.  Thoroughly rinse your body with warm water from the neck down.  8.  DO NOT shower/wash with your normal soap after using and rinsing off the CHG Soap.  9.  Pat yourself dry with a clean towel.            10.  Wear clean pajamas.            11.  Place clean sheets on your bed the night of your first shower and do not sleep with pets.  Day  of Surgery  Do not apply any lotions/deodorants the morning of surgery.  Please wear clean clothes to the hospital/surgery center.   Please read over the following fact sheets that you were given: Pain Booklet, Coughing and Deep Breathing, Blood Transfusion Information, Total Joint Packet, MRSA Information and Surgical Site Infection Prevention

## 2014-09-02 ENCOUNTER — Encounter (HOSPITAL_COMMUNITY): Payer: Self-pay

## 2014-09-02 ENCOUNTER — Encounter (HOSPITAL_COMMUNITY)
Admission: RE | Admit: 2014-09-02 | Discharge: 2014-09-02 | Disposition: A | Discharge status: Home / Self Care | Payer: Medicare Other | Source: Ambulatory Visit | Attending: Orthopedic Surgery | Admitting: Orthopedic Surgery

## 2014-09-02 ENCOUNTER — Encounter (HOSPITAL_COMMUNITY)
Admission: RE | Admit: 2014-09-02 | Discharge: 2014-09-02 | Disposition: A | Discharge status: Home / Self Care | Payer: Medicare Other | Source: Ambulatory Visit | Attending: Orthopaedic Surgery | Admitting: Orthopaedic Surgery

## 2014-09-02 ENCOUNTER — Other Ambulatory Visit (HOSPITAL_COMMUNITY): Payer: Medicare Other

## 2014-09-02 DIAGNOSIS — M179 Osteoarthritis of knee, unspecified: Secondary | ICD-10-CM | POA: Diagnosis not present

## 2014-09-02 DIAGNOSIS — Z0183 Encounter for blood typing: Secondary | ICD-10-CM | POA: Insufficient documentation

## 2014-09-02 DIAGNOSIS — I10 Essential (primary) hypertension: Secondary | ICD-10-CM | POA: Diagnosis not present

## 2014-09-02 DIAGNOSIS — Z01812 Encounter for preprocedural laboratory examination: Secondary | ICD-10-CM | POA: Insufficient documentation

## 2014-09-02 DIAGNOSIS — Z01818 Encounter for other preprocedural examination: Secondary | ICD-10-CM | POA: Insufficient documentation

## 2014-09-02 DIAGNOSIS — Z86718 Personal history of other venous thrombosis and embolism: Secondary | ICD-10-CM | POA: Diagnosis not present

## 2014-09-02 HISTORY — DX: Unspecified osteoarthritis, unspecified site: M19.90

## 2014-09-02 HISTORY — DX: Other specified disorders of bone density and structure, unspecified site: M85.80

## 2014-09-02 HISTORY — DX: Other specified health status: Z78.9

## 2014-09-02 LAB — PROTIME-INR
INR: 1.06 (ref 0.00–1.49)
PROTHROMBIN TIME: 13.9 s (ref 11.6–15.2)

## 2014-09-02 LAB — URINE MICROSCOPIC-ADD ON

## 2014-09-02 LAB — COMPREHENSIVE METABOLIC PANEL
ALT: 22 U/L (ref 0–35)
AST: 23 U/L (ref 0–37)
Albumin: 3.8 g/dL (ref 3.5–5.2)
Alkaline Phosphatase: 56 U/L (ref 39–117)
Anion gap: 9 (ref 5–15)
BUN: 8 mg/dL (ref 6–23)
CHLORIDE: 104 mmol/L (ref 96–112)
CO2: 25 mmol/L (ref 19–32)
CREATININE: 0.55 mg/dL (ref 0.50–1.10)
Calcium: 9.6 mg/dL (ref 8.4–10.5)
GFR calc Af Amer: >90 mL/min (ref >90)
Glucose, Bld: 107 mg/dL — ABNORMAL HIGH (ref 70–99)
Potassium: 4 mmol/L (ref 3.5–5.1)
Sodium: 138 mmol/L (ref 135–145)
Total Bilirubin: 0.9 mg/dL (ref 0.3–1.2)
Total Protein: 7.6 g/dL (ref 6.0–8.3)

## 2014-09-02 LAB — CBC WITH DIFFERENTIAL/PLATELET
Basophils Absolute: 0 10*3/uL (ref 0.0–0.1)
Basophils Relative: 0 % (ref 0–1)
EOS ABS: 0.1 10*3/uL (ref 0.0–0.7)
EOS PCT: 1 % (ref 0–5)
HEMATOCRIT: 37.5 % (ref 36.0–46.0)
Hemoglobin: 12.6 g/dL (ref 12.0–15.0)
Lymphocytes Relative: 41 % (ref 12–46)
Lymphs Abs: 2.2 10*3/uL (ref 0.7–4.0)
MCH: 29.7 pg (ref 26.0–34.0)
MCHC: 33.6 g/dL (ref 30.0–36.0)
MCV: 88.4 fL (ref 78.0–100.0)
MONOS PCT: 7 % (ref 3–12)
Monocytes Absolute: 0.4 10*3/uL (ref 0.1–1.0)
Neutro Abs: 2.7 10*3/uL (ref 1.7–7.7)
Neutrophils Relative %: 51 % (ref 43–77)
Platelets: 267 10*3/uL (ref 150–400)
RBC: 4.24 MIL/uL (ref 3.87–5.11)
RDW: 13.3 % (ref 11.5–15.5)
WBC: 5.3 10*3/uL (ref 4.0–10.5)

## 2014-09-02 LAB — URINALYSIS, ROUTINE W REFLEX MICROSCOPIC
Bilirubin Urine: NEGATIVE
GLUCOSE, UA: NEGATIVE mg/dL
Ketones, ur: NEGATIVE mg/dL
Nitrite: NEGATIVE
Protein, ur: NEGATIVE mg/dL
Specific Gravity, Urine: 1.007 (ref 1.005–1.030)
Urobilinogen, UA: 0.2 mg/dL (ref 0.0–1.0)
pH: 7 (ref 5.0–8.0)

## 2014-09-02 LAB — APTT: aPTT: 31 seconds (ref 24–37)

## 2014-09-02 LAB — ABO/RH: ABO/RH(D): AB POS

## 2014-09-02 LAB — TYPE AND SCREEN
ABO/RH(D): AB POS
ANTIBODY SCREEN: NEGATIVE

## 2014-09-02 LAB — SURGICAL PCR SCREEN
MRSA, PCR: NEGATIVE
Staphylococcus aureus: NEGATIVE

## 2014-09-02 NOTE — Progress Notes (Signed)
   09/02/14 0842  OBSTRUCTIVE SLEEP APNEA  Have you ever been diagnosed with sleep apnea through a sleep study? No  Do you snore loudly (loud enough to be heard through closed doors)?  0  Do you often feel tired, fatigued, or sleepy during the daytime? 0  Has anyone observed you stop breathing during your sleep? 0  Do you have, or are you being treated for high blood pressure? 1  BMI more than 35 kg/m2? 0  Age over 66 years old? 1  Neck circumference greater than 40 cm/16 inches? 0  Gender: 0

## 2014-09-03 LAB — URINE CULTURE
CULTURE: NO GROWTH
Colony Count: NO GROWTH

## 2014-09-09 NOTE — H&P (Signed)
CHIEF COMPLAINT:  Painful right knee.   HISTORY OF PRESENT ILLNESS:  Kimberly [name] is a very pleasant 64 year old female who is seen today for evaluation of her right knee.  She does not remember any history of injury or trauma recently to her knees, but she has had both of her knees become quite symptomatic with pain and discomfort.  Ten years ago she did have viscosupplementation that of Synvisc.  She did have a right knee arthroscopy by Dr. Alphonzo Cruise in 2004 for which she states she had done fairly well overall.  She has used Mobic which has been somewhat helpful.  She did see Dr. Sheldon Silvan in February and had a cortisone injection to her left knee, but not her right knee.  She does use Tylenol and Mobic again which is only helpful to a point.  This has been a chronic long term problem for years, but unfortunately over the past 6 months her symptoms have worsened.  She is now having pain with every step.  She has tried conservative measures, but this is not helpful.  She does have pain at nighttime and pain with activities of daily living.  She states she has a little bit of instability feeling and certainly occasional catching and locking type symptoms which have been very chronic over the 10 years, but has worsened over the last 6 months.  Her pain is now to the point where she has pain with all activity and at nighttime and therefore seen today for re-evaluation.    Past medical history and general health is good.     PAST SURGICAL HISTORY:   Surgery 2004 arthroscopy right knee.   CURRENT MEDICATIONS:   Avapro 150 mg daily.  Levothyroxine 50 mcg daily.  Vitamin D, 1000 units daily.  Calcium 600 mg daily.  Fish oil daily.  B-complex 1 daily.  Tumeric 500 mg once a day.    ALLERGIES:   None known.   REVIEW OF SYSTEMS:   Completely denied except for some tinnitus.   FAMILY HISTORY:   Positive for mother who died at age 64 from diabetes.  She had a father who died at age 44 from heart disease and heart  attack.  She has a brother who is deceased at age 69 from liver failure secondary to alcohol use.  She has 2 other brothers, age 69 and 49 that are healthy and alive.  No sisters.   SOCIAL HISTORY:   She is retired 64 year old [place] female who is married.  She denies any use of tobacco or alcohol.   PHYSICAL EXAMINATION:  Examination today reveals a very pleasant 64 year old female well-developed, well-nourished, cooperative in mild distress secondary to right knee pain.   Height is 61 inches, weight 158 pounds.  BMI 29.9.     Vital signs:  Temperature 95.6.  Pulse 76.  Respirations 16.  Blood pressure 120/66.   Head is normocephalic.   Eyes:  Pupils round, reactive to light and accommodation with extraocular movements intact.   Ears, nose and throat were benign. Chest had good expansion. Lungs are essentially clear.   Cardiac had a regular rhythm and rate.  Normal S1, S2, no murmurs were noted. Pulses were 1+ bilateral and symmetric in the lower extremities. Abdomen scaphoid soft, nontender, no mass is palpable.  Normal bowel sounds are present. CNS:  She is oriented x3 and cranial nerves II-XII grossly intact.    Musculoskeletal:  Today she has range of motion of the right knee lacking about 7-8 degrees  shy of full extension and does flex to about 105 degrees.  She has patellofemoral crepitus bilaterally.  Varus deformity with a mild effusion.  I can correct her to neutral.  Pain along the medial aspect of the right knee.  Good motion of her hips.     RADIOGRAPHS:  Radiographic studies of the right knee reveals bone-on-bone with periarticular spurring and bony overgrowth medially more than laterally.  She does have some sclerosing and some cystic changes in the medial tibial plateau and in the medial femoral condyle.  She does have some patellofemoral OA also noted.     CLINICAL IMPRESSION:   1.  End-stage primary OA of the right knee. 2.  OA left knee. 3.  Hypothyroidism. 4.  Possible  hypertension.   RECOMMENDATIONS:  At this time I have reviewed a form from Dr. Sheldon Silvan stating that postoperative DVT precaution should be noted.  She did have a DVT in 2003.  He otherwise feels that she is a candidate from both medical and cardiac standpoint for a right total knee arthroplasty.     At this time we feel that a right total knee arthroplasty is indicated.  Procedure, risks and benefits were fully explained to her in detail.  Appropriate models were used.  I have gone over the hospital course and our postop plan.  All questions answered in detail and she is satisfied with this.  We will proceed with total knee replacement on the right in the very near future.   Mike Craze [place], [employer] [phone]  09/09/2014 1:02 PM

## 2014-09-13 MED ORDER — SODIUM CHLORIDE 0.9 % IV SOLN
INTRAVENOUS | Status: DC
  Administered 2014-09-14: 17:00:00 via INTRAVENOUS

## 2014-09-13 MED ORDER — CEFAZOLIN SODIUM-DEXTROSE 2-3 GM-% IV SOLR
2.0000 g | INTRAVENOUS | Status: AC
Start: 2014-09-14 — End: 2014-09-14
  Administered 2014-09-14: 2 g via INTRAVENOUS
  Filled 2014-09-13: qty 50

## 2014-09-13 MED ORDER — CHLORHEXIDINE GLUCONATE 4 % EX LIQD
60.0000 mL | Freq: Once | CUTANEOUS | Status: DC
  Filled 2014-09-13: qty 60

## 2014-09-13 MED ORDER — ACETAMINOPHEN 10 MG/ML IV SOLN
1000.0000 mg | Freq: Once | INTRAVENOUS | Status: AC
  Administered 2014-09-14: 1000 mg via INTRAVENOUS
  Filled 2014-09-13: qty 100

## 2014-09-14 ENCOUNTER — Encounter (HOSPITAL_COMMUNITY): Payer: Self-pay | Admitting: *Deleted

## 2014-09-14 ENCOUNTER — Inpatient Hospital Stay (HOSPITAL_COMMUNITY): Payer: Medicare Other | Admitting: Certified Registered Nurse Anesthetist

## 2014-09-14 ENCOUNTER — Encounter (HOSPITAL_COMMUNITY)
Admission: RE | Disposition: A | Discharge status: Skilled Nursing Facility | Payer: Self-pay | Source: Ambulatory Visit | Attending: Orthopaedic Surgery

## 2014-09-14 ENCOUNTER — Inpatient Hospital Stay (HOSPITAL_COMMUNITY)
Admission: RE | Admit: 2014-09-14 | Discharge: 2014-09-17 | DRG: 470 | Disposition: A | Discharge status: Skilled Nursing Facility | Payer: Medicare Other | Source: Ambulatory Visit | Attending: Orthopaedic Surgery | Admitting: Orthopaedic Surgery

## 2014-09-14 DIAGNOSIS — Z96651 Presence of right artificial knee joint: Secondary | ICD-10-CM | POA: Diagnosis not present

## 2014-09-14 DIAGNOSIS — M8580 Other specified disorders of bone density and structure, unspecified site: Secondary | ICD-10-CM | POA: Diagnosis not present

## 2014-09-14 DIAGNOSIS — Z833 Family history of diabetes mellitus: Secondary | ICD-10-CM | POA: Diagnosis not present

## 2014-09-14 DIAGNOSIS — Z86718 Personal history of other venous thrombosis and embolism: Secondary | ICD-10-CM

## 2014-09-14 DIAGNOSIS — M1711 Unilateral primary osteoarthritis, right knee: Secondary | ICD-10-CM | POA: Diagnosis present

## 2014-09-14 DIAGNOSIS — M25561 Pain in right knee: Secondary | ICD-10-CM | POA: Diagnosis not present

## 2014-09-14 DIAGNOSIS — Z8249 Family history of ischemic heart disease and other diseases of the circulatory system: Secondary | ICD-10-CM

## 2014-09-14 DIAGNOSIS — D62 Acute posthemorrhagic anemia: Secondary | ICD-10-CM | POA: Diagnosis not present

## 2014-09-14 DIAGNOSIS — I10 Essential (primary) hypertension: Secondary | ICD-10-CM | POA: Diagnosis present

## 2014-09-14 DIAGNOSIS — K219 Gastro-esophageal reflux disease without esophagitis: Secondary | ICD-10-CM | POA: Diagnosis present

## 2014-09-14 DIAGNOSIS — Z7951 Long term (current) use of inhaled steroids: Secondary | ICD-10-CM | POA: Diagnosis not present

## 2014-09-14 DIAGNOSIS — Z471 Aftercare following joint replacement surgery: Secondary | ICD-10-CM | POA: Diagnosis not present

## 2014-09-14 DIAGNOSIS — M6281 Muscle weakness (generalized): Secondary | ICD-10-CM | POA: Diagnosis not present

## 2014-09-14 DIAGNOSIS — R278 Other lack of coordination: Secondary | ICD-10-CM | POA: Diagnosis not present

## 2014-09-14 DIAGNOSIS — M179 Osteoarthritis of knee, unspecified: Secondary | ICD-10-CM | POA: Diagnosis not present

## 2014-09-14 DIAGNOSIS — Z7901 Long term (current) use of anticoagulants: Secondary | ICD-10-CM | POA: Diagnosis not present

## 2014-09-14 DIAGNOSIS — E039 Hypothyroidism, unspecified: Secondary | ICD-10-CM | POA: Diagnosis present

## 2014-09-14 DIAGNOSIS — M1990 Unspecified osteoarthritis, unspecified site: Secondary | ICD-10-CM | POA: Diagnosis not present

## 2014-09-14 DIAGNOSIS — M1712 Unilateral primary osteoarthritis, left knee: Secondary | ICD-10-CM | POA: Diagnosis present

## 2014-09-14 DIAGNOSIS — Z79899 Other long term (current) drug therapy: Secondary | ICD-10-CM | POA: Diagnosis not present

## 2014-09-14 DIAGNOSIS — R2681 Unsteadiness on feet: Secondary | ICD-10-CM | POA: Diagnosis not present

## 2014-09-14 DIAGNOSIS — G8918 Other acute postprocedural pain: Secondary | ICD-10-CM | POA: Diagnosis not present

## 2014-09-14 HISTORY — PX: TOTAL KNEE ARTHROPLASTY: SHX125

## 2014-09-14 SURGERY — ARTHROPLASTY, KNEE, TOTAL
Anesthesia: Monitor Anesthesia Care | Site: Knee | Laterality: Right

## 2014-09-14 MED ORDER — HYDROMORPHONE HCL 1 MG/ML IJ SOLN: 0.2500 mg | INTRAMUSCULAR | Status: DC | PRN

## 2014-09-14 MED ORDER — HYDROCODONE-ACETAMINOPHEN 7.5-325 MG PO TABS
1.0000 | ORAL_TABLET | Freq: Once | ORAL | Status: DC | PRN
Start: 2014-09-14 — End: 2014-09-14

## 2014-09-14 MED ORDER — METHOCARBAMOL 1000 MG/10ML IJ SOLN
500.0000 mg | Freq: Four times a day (QID) | INTRAVENOUS | Status: DC | PRN
  Filled 2014-09-14: qty 5

## 2014-09-14 MED ORDER — CEFAZOLIN SODIUM-DEXTROSE 2-3 GM-% IV SOLR
2.0000 g | Freq: Four times a day (QID) | INTRAVENOUS | Status: AC
  Administered 2014-09-14 – 2014-09-15 (×2): 2 g via INTRAVENOUS
  Filled 2014-09-14 (×2): qty 50

## 2014-09-14 MED ORDER — BISACODYL 10 MG RE SUPP
10.0000 mg | Freq: Every day | RECTAL | Status: DC | PRN
  Administered 2014-09-17: 10 mg via RECTAL
  Filled 2014-09-14: qty 1

## 2014-09-14 MED ORDER — ALUM & MAG HYDROXIDE-SIMETH 200-200-20 MG/5ML PO SUSP: 30.0000 mL | ORAL | Status: DC | PRN

## 2014-09-14 MED ORDER — ONDANSETRON HCL 4 MG PO TABS: 4.0000 mg | ORAL_TABLET | Freq: Four times a day (QID) | ORAL | Status: DC | PRN

## 2014-09-14 MED ORDER — ACETAMINOPHEN 10 MG/ML IV SOLN
1000.0000 mg | Freq: Four times a day (QID) | INTRAVENOUS | Status: AC
  Administered 2014-09-14 – 2014-09-15 (×4): 1000 mg via INTRAVENOUS
  Filled 2014-09-14 (×4): qty 100

## 2014-09-14 MED ORDER — EPHEDRINE SULFATE 50 MG/ML IJ SOLN
INTRAMUSCULAR | Status: DC | PRN
  Administered 2014-09-14 (×2): 5 mg via INTRAVENOUS

## 2014-09-14 MED ORDER — PROPOFOL 10 MG/ML IV BOLUS
INTRAVENOUS | Status: AC
  Filled 2014-09-14: qty 20

## 2014-09-14 MED ORDER — POLYETHYLENE GLYCOL 3350 17 G PO PACK: 17.0000 g | PACK | Freq: Every day | ORAL | Status: DC | PRN

## 2014-09-14 MED ORDER — ONDANSETRON HCL 4 MG/2ML IJ SOLN: 4.0000 mg | Freq: Four times a day (QID) | INTRAMUSCULAR | Status: DC | PRN

## 2014-09-14 MED ORDER — DOCUSATE SODIUM 100 MG PO CAPS
100.0000 mg | ORAL_CAPSULE | Freq: Two times a day (BID) | ORAL | Status: DC
  Administered 2014-09-15 – 2014-09-17 (×4): 100 mg via ORAL
  Filled 2014-09-14 (×6): qty 1

## 2014-09-14 MED ORDER — CALCIUM CARBONATE 1250 (500 CA) MG PO TABS
1250.0000 mg | ORAL_TABLET | Freq: Two times a day (BID) | ORAL | Status: DC
  Administered 2014-09-15 – 2014-09-17 (×5): 1250 mg via ORAL
  Filled 2014-09-14 (×5): qty 3

## 2014-09-14 MED ORDER — PROPOFOL INFUSION 10 MG/ML OPTIME
INTRAVENOUS | Status: DC | PRN
  Administered 2014-09-14: 50 ug/kg/min via INTRAVENOUS

## 2014-09-14 MED ORDER — ONDANSETRON HCL 4 MG/2ML IJ SOLN
INTRAMUSCULAR | Status: AC
  Filled 2014-09-14: qty 2

## 2014-09-14 MED ORDER — PHENYLEPHRINE HCL 10 MG/ML IJ SOLN
INTRAMUSCULAR | Status: DC | PRN
  Administered 2014-09-14 (×2): 40 ug via INTRAVENOUS

## 2014-09-14 MED ORDER — MAGNESIUM CITRATE PO SOLN: 1.0000 | Freq: Once | ORAL | Status: AC | PRN

## 2014-09-14 MED ORDER — PHENOL 1.4 % MT LIQD: 1.0000 | OROMUCOSAL | Status: DC | PRN

## 2014-09-14 MED ORDER — HYDROMORPHONE HCL 1 MG/ML IJ SOLN
0.5000 mg | INTRAMUSCULAR | Status: DC | PRN
Start: 2014-09-14 — End: 2014-09-17
  Administered 2014-09-14 – 2014-09-15 (×2): 0.5 mg via INTRAVENOUS
  Filled 2014-09-14 (×2): qty 1

## 2014-09-14 MED ORDER — BUPIVACAINE IN DEXTROSE 0.75-8.25 % IT SOLN
INTRATHECAL | Status: DC | PRN
  Administered 2014-09-14: 2 mL via INTRATHECAL

## 2014-09-14 MED ORDER — FENTANYL CITRATE (PF) 250 MCG/5ML IJ SOLN
INTRAMUSCULAR | Status: AC
  Filled 2014-09-14: qty 5

## 2014-09-14 MED ORDER — LACTATED RINGERS IV SOLN
INTRAVENOUS | Status: DC
  Administered 2014-09-14 (×2): via INTRAVENOUS

## 2014-09-14 MED ORDER — SODIUM CHLORIDE 0.9 % IR SOLN
Status: DC | PRN
  Administered 2014-09-14 (×2): 1000 mL

## 2014-09-14 MED ORDER — 0.9 % SODIUM CHLORIDE (POUR BTL) OPTIME
TOPICAL | Status: DC | PRN
  Administered 2014-09-14: 1000 mL

## 2014-09-14 MED ORDER — DIPHENHYDRAMINE HCL 12.5 MG/5ML PO ELIX: 12.5000 mg | ORAL_SOLUTION | ORAL | Status: DC | PRN

## 2014-09-14 MED ORDER — METOCLOPRAMIDE HCL 5 MG PO TABS: 5.0000 mg | ORAL_TABLET | Freq: Three times a day (TID) | ORAL | Status: DC | PRN

## 2014-09-14 MED ORDER — LEVOTHYROXINE SODIUM 50 MCG PO TABS
50.0000 ug | ORAL_TABLET | Freq: Every day | ORAL | Status: DC
  Administered 2014-09-15 – 2014-09-17 (×3): 50 ug via ORAL
  Filled 2014-09-14 (×3): qty 1

## 2014-09-14 MED ORDER — METHOCARBAMOL 500 MG PO TABS
ORAL_TABLET | ORAL | Status: AC
  Filled 2014-09-14: qty 1

## 2014-09-14 MED ORDER — METHOCARBAMOL 500 MG PO TABS
500.0000 mg | ORAL_TABLET | Freq: Four times a day (QID) | ORAL | Status: DC | PRN
  Administered 2014-09-14 – 2014-09-16 (×6): 500 mg via ORAL
  Filled 2014-09-14 (×5): qty 1

## 2014-09-14 MED ORDER — OXYCODONE HCL 5 MG PO TABS
ORAL_TABLET | ORAL | Status: AC
  Filled 2014-09-14: qty 2

## 2014-09-14 MED ORDER — ONDANSETRON HCL 4 MG/2ML IJ SOLN: 4.0000 mg | Freq: Once | INTRAMUSCULAR | Status: DC | PRN

## 2014-09-14 MED ORDER — SODIUM CHLORIDE 0.9 % IV SOLN
INTRAVENOUS | Status: DC
  Administered 2014-09-14: 19:00:00 via INTRAVENOUS

## 2014-09-14 MED ORDER — FENTANYL CITRATE (PF) 100 MCG/2ML IJ SOLN
INTRAMUSCULAR | Status: AC
  Administered 2014-09-14: 50 ug via INTRAVENOUS
  Filled 2014-09-14: qty 2

## 2014-09-14 MED ORDER — ROCURONIUM BROMIDE 50 MG/5ML IV SOLN
INTRAVENOUS | Status: AC
  Filled 2014-09-14: qty 1

## 2014-09-14 MED ORDER — MIDAZOLAM HCL 2 MG/2ML IJ SOLN
INTRAMUSCULAR | Status: AC
  Administered 2014-09-14: 1 mg via INTRAVENOUS
  Filled 2014-09-14: qty 2

## 2014-09-14 MED ORDER — IRBESARTAN 150 MG PO TABS
150.0000 mg | ORAL_TABLET | Freq: Every day | ORAL | Status: DC
  Administered 2014-09-14 – 2014-09-17 (×4): 150 mg via ORAL
  Filled 2014-09-14 (×4): qty 1

## 2014-09-14 MED ORDER — MIDAZOLAM HCL 2 MG/2ML IJ SOLN
1.0000 mg | Freq: Once | INTRAMUSCULAR | Status: AC
  Administered 2014-09-14: 1 mg via INTRAVENOUS

## 2014-09-14 MED ORDER — KETOROLAC TROMETHAMINE 15 MG/ML IJ SOLN
7.5000 mg | Freq: Four times a day (QID) | INTRAMUSCULAR | Status: AC
  Administered 2014-09-14 – 2014-09-15 (×3): 7.5 mg via INTRAVENOUS
  Filled 2014-09-14 (×3): qty 1

## 2014-09-14 MED ORDER — RIVAROXABAN 10 MG PO TABS
10.0000 mg | ORAL_TABLET | Freq: Every day | ORAL | Status: DC
  Administered 2014-09-15 – 2014-09-17 (×3): 10 mg via ORAL
  Filled 2014-09-14 (×3): qty 1

## 2014-09-14 MED ORDER — MIDAZOLAM HCL 2 MG/2ML IJ SOLN
INTRAMUSCULAR | Status: AC
  Filled 2014-09-14: qty 2

## 2014-09-14 MED ORDER — BUPIVACAINE-EPINEPHRINE 0.25% -1:200000 IJ SOLN
INTRAMUSCULAR | Status: DC | PRN
  Administered 2014-09-14: 30 mL

## 2014-09-14 MED ORDER — VITAMIN D 1000 UNITS PO TABS
1000.0000 [IU] | ORAL_TABLET | Freq: Every day | ORAL | Status: DC
  Administered 2014-09-14 – 2014-09-17 (×4): 1000 [IU] via ORAL
  Filled 2014-09-14 (×4): qty 1

## 2014-09-14 MED ORDER — MENTHOL 3 MG MT LOZG: 1.0000 | LOZENGE | OROMUCOSAL | Status: DC | PRN

## 2014-09-14 MED ORDER — BUPIVACAINE-EPINEPHRINE (PF) 0.25% -1:200000 IJ SOLN
INTRAMUSCULAR | Status: AC
  Filled 2014-09-14: qty 30

## 2014-09-14 MED ORDER — FENTANYL CITRATE (PF) 100 MCG/2ML IJ SOLN
50.0000 ug | Freq: Once | INTRAMUSCULAR | Status: AC
  Administered 2014-09-14: 50 ug via INTRAVENOUS

## 2014-09-14 MED ORDER — METOCLOPRAMIDE HCL 5 MG/ML IJ SOLN: 5.0000 mg | Freq: Three times a day (TID) | INTRAMUSCULAR | Status: DC | PRN

## 2014-09-14 MED ORDER — KETOROLAC TROMETHAMINE 15 MG/ML IJ SOLN
INTRAMUSCULAR | Status: AC
  Filled 2014-09-14: qty 1

## 2014-09-14 MED ORDER — LIDOCAINE HCL (CARDIAC) 20 MG/ML IV SOLN
INTRAVENOUS | Status: AC
  Filled 2014-09-14: qty 5

## 2014-09-14 MED ORDER — FENTANYL CITRATE (PF) 100 MCG/2ML IJ SOLN
INTRAMUSCULAR | Status: DC | PRN
  Administered 2014-09-14: 50 ug via INTRAVENOUS

## 2014-09-14 MED ORDER — OXYCODONE HCL 5 MG PO TABS
5.0000 mg | ORAL_TABLET | ORAL | Status: DC | PRN
  Administered 2014-09-14 – 2014-09-17 (×12): 10 mg via ORAL
  Filled 2014-09-14 (×11): qty 2

## 2014-09-14 SURGICAL SUPPLY — 66 items
BANDAGE ESMARK 6X9 LF (GAUZE/BANDAGES/DRESSINGS) ×1 IMPLANT
BLADE SAGITTAL 25.0X1.19X90 (BLADE) ×2 IMPLANT
BNDG ESMARK 6X9 LF (GAUZE/BANDAGES/DRESSINGS) ×2
BOWL SMART MIX CTS (DISPOSABLE) ×2 IMPLANT
CAP KNEE TOTAL 3 SIGMA ×2 IMPLANT
CEMENT HV SMART SET (Cement) ×4 IMPLANT
COVER SURGICAL LIGHT HANDLE (MISCELLANEOUS) ×2 IMPLANT
CUFF TOURNIQUET SINGLE 34IN LL (TOURNIQUET CUFF) ×2 IMPLANT
CUFF TOURNIQUET SINGLE 44IN (TOURNIQUET CUFF) IMPLANT
DRAPE EXTREMITY T 121X128X90 (DRAPE) ×2 IMPLANT
DRAPE IMP U-DRAPE 54X76 (DRAPES) ×2 IMPLANT
DRAPE PROXIMA HALF (DRAPES) ×2 IMPLANT
DRSG ADAPTIC 3X8 NADH LF (GAUZE/BANDAGES/DRESSINGS) ×2 IMPLANT
DRSG PAD ABDOMINAL 8X10 ST (GAUZE/BANDAGES/DRESSINGS) ×4 IMPLANT
DURAPREP 26ML APPLICATOR (WOUND CARE) ×4 IMPLANT
ELECT CAUTERY BLADE 6.4 (BLADE) ×2 IMPLANT
ELECT REM PT RETURN 9FT ADLT (ELECTROSURGICAL) ×2
ELECTRODE REM PT RTRN 9FT ADLT (ELECTROSURGICAL) ×1 IMPLANT
EVACUATOR 1/8 PVC DRAIN (DRAIN) ×2 IMPLANT
FACESHIELD WRAPAROUND (MASK) ×4 IMPLANT
FLOSEAL 10ML (HEMOSTASIS) IMPLANT
GAUZE SPONGE 4X4 12PLY STRL (GAUZE/BANDAGES/DRESSINGS) ×2 IMPLANT
GLOVE BIO SURGEON STRL SZ 6.5 (GLOVE) ×4 IMPLANT
GLOVE BIOGEL PI IND STRL 6.5 (GLOVE) ×1 IMPLANT
GLOVE BIOGEL PI IND STRL 7.5 (GLOVE) ×1 IMPLANT
GLOVE BIOGEL PI IND STRL 8 (GLOVE) ×1 IMPLANT
GLOVE BIOGEL PI IND STRL 8.5 (GLOVE) ×1 IMPLANT
GLOVE BIOGEL PI INDICATOR 6.5 (GLOVE) ×1
GLOVE BIOGEL PI INDICATOR 7.5 (GLOVE) ×1
GLOVE BIOGEL PI INDICATOR 8 (GLOVE) ×1
GLOVE BIOGEL PI INDICATOR 8.5 (GLOVE) ×1
GLOVE ECLIPSE 8.0 STRL XLNG CF (GLOVE) ×6 IMPLANT
GLOVE SURG ORTHO 8.5 STRL (GLOVE) ×8 IMPLANT
GLOVE SURG SS PI 6.0 STRL IVOR (GLOVE) ×2 IMPLANT
GLOVE SURG SS PI 7.0 STRL IVOR (GLOVE) ×2 IMPLANT
GOWN STRL REUS W/ TWL LRG LVL3 (GOWN DISPOSABLE) ×4 IMPLANT
GOWN STRL REUS W/TWL 2XL LVL3 (GOWN DISPOSABLE) ×2 IMPLANT
GOWN STRL REUS W/TWL LRG LVL3 (GOWN DISPOSABLE) ×4
HANDPIECE INTERPULSE COAX TIP (DISPOSABLE) ×1
KIT BASIN OR (CUSTOM PROCEDURE TRAY) ×2 IMPLANT
KIT ROOM TURNOVER OR (KITS) ×2 IMPLANT
MANIFOLD NEPTUNE II (INSTRUMENTS) ×2 IMPLANT
NEEDLE 22X1 1/2 (OR ONLY) (NEEDLE) ×2 IMPLANT
NS IRRIG 1000ML POUR BTL (IV SOLUTION) ×2 IMPLANT
PACK TOTAL JOINT (CUSTOM PROCEDURE TRAY) ×2 IMPLANT
PACK UNIVERSAL I (CUSTOM PROCEDURE TRAY) ×2 IMPLANT
PAD ARMBOARD 7.5X6 YLW CONV (MISCELLANEOUS) ×4 IMPLANT
PAD CAST 4YDX4 CTTN HI CHSV (CAST SUPPLIES) ×1 IMPLANT
PADDING CAST COTTON 4X4 STRL (CAST SUPPLIES) ×1
PADDING CAST COTTON 6X4 STRL (CAST SUPPLIES) ×2 IMPLANT
SET HNDPC FAN SPRY TIP SCT (DISPOSABLE) ×1 IMPLANT
STAPLER VISISTAT 35W (STAPLE) ×2 IMPLANT
SUCTION FRAZIER TIP 10 FR DISP (SUCTIONS) ×2 IMPLANT
SUT BONE WAX W31G (SUTURE) ×2 IMPLANT
SUT ETHIBOND NAB CT1 #1 30IN (SUTURE) ×6 IMPLANT
SUT MNCRL AB 3-0 PS2 18 (SUTURE) ×2 IMPLANT
SUT VIC AB 0 CT1 27 (SUTURE) ×1
SUT VIC AB 0 CT1 27XBRD ANBCTR (SUTURE) ×1 IMPLANT
SUT VIC AB 1 CT1 27 (SUTURE) ×1
SUT VIC AB 1 CT1 27XBRD ANBCTR (SUTURE) ×1 IMPLANT
SYR CONTROL 10ML LL (SYRINGE) IMPLANT
TOWEL OR 17X24 6PK STRL BLUE (TOWEL DISPOSABLE) ×2 IMPLANT
TOWEL OR 17X26 10 PK STRL BLUE (TOWEL DISPOSABLE) ×2 IMPLANT
TRAY FOLEY CATH 16FRSI W/METER (SET/KITS/TRAYS/PACK) ×2 IMPLANT
WATER STERILE IRR 1000ML POUR (IV SOLUTION) IMPLANT
WRAP KNEE MAXI GEL POST OP (GAUZE/BANDAGES/DRESSINGS) ×2 IMPLANT

## 2014-09-14 NOTE — Transfer of Care (Signed)
Immediate Anesthesia Transfer of Care Note  Patient: Kimberly [name]  Procedure(s) Performed: Procedure(s): RIGHT TOTAL KNEE ARTHROPLASTY (Right)  Patient Location: PACU  Anesthesia Type:MAC combined with regional for post-op pain  Level of Consciousness: awake, alert  and oriented  Airway & Oxygen Therapy: Patient Spontanous Breathing  Post-op Assessment: Report given to RN and Post -op Vital signs reviewed and stable  Post vital signs: Reviewed and stable  Last Vitals:  Filed Vitals:   09/14/14 0926  BP:   Pulse: 63  Temp:   Resp: 13    Complications: No apparent anesthesia complications

## 2014-09-14 NOTE — H&P (Signed)
  The recent History & Physical has been reviewed. I have personally examined the patient today. There is no interval change to the documented History & Physical. The patient would like to proceed with the procedure.  [name] W 09/14/2014,  10:38 AM

## 2014-09-14 NOTE — Anesthesia Procedure Notes (Addendum)
Spinal Patient location during procedure: OR Staffing Anesthesiologist: MOSER, CHRIS Preanesthetic Checklist Completed: patient identified, surgical consent, pre-op evaluation, timeout performed, IV checked, risks and benefits discussed and monitors and equipment checked Spinal Block Patient position: sitting Prep: site prepped and draped and DuraPrep Patient monitoring: heart rate, cardiac monitor, continuous pulse ox and blood pressure Approach: midline Location: L3-4 Injection technique: single-shot Needle Needle type: Pencan  Needle gauge: 24 G Needle length: 10 cm Assessment Sensory level: T6  Anesthesia Regional Block:  Adductor canal block  Pre-Anesthetic Checklist: ,, timeout performed, Correct Patient, Correct Site, Correct Laterality, Correct Procedure, Correct Position, site marked, Risks and benefits discussed,  Surgical consent,  Pre-op evaluation,  At surgeon's request and post-op pain management  Laterality: Right  Prep: chloraprep       Needles:  Injection technique: Single-shot  Needle Type: Echogenic Stimulator Needle     Needle Length: 9cm 9 cm Needle Gauge: 22 and 22 G    Additional Needles:  Procedures: ultrasound guided (picture in chart) Adductor canal block Narrative:  Start time: 10/07/2014 9:20 AM End time: 10/07/2014 9:25 AM Injection made incrementally with aspirations every 5 mL.  Performed by: Personally   Additional Notes: 30 cc 0.5% bupivacaine with 1:200 epi injected easily

## 2014-09-14 NOTE — Progress Notes (Signed)
Orthopedic Tech Progress Note Patient Details:  Kimberly [name] [birth year] [id number] Applied CPM to RLE.  Applied OHF with trapeze to pt.'s bed. CPM Right Knee CPM Right Knee: On Right Knee Flexion (Degrees): 90 Right Knee Extension (Degrees): 0   Darrol Poke 09/14/2014, 2:45 PM

## 2014-09-14 NOTE — Op Note (Signed)
PATIENT ID:      Kimberly [name]  MRN:     [id number] DOB/AGE:    1950-07-30 / 64 y.o.       OPERATIVE REPORT    DATE OF PROCEDURE:  09/14/2014       PREOPERATIVE DIAGNOSIS:  PRIMARY END STAGE OSTEOARTHRITIS OF RIGHT KNEE                                                       Estimated body mass index is 30.81 kg/(m^2) as calculated from the following:   Height as of 09/02/14: 5\' 1"  (1.549 m).   Weight as of this encounter: 73.936 kg (163 lb).     POSTOPERATIVE DIAGNOSIS:   END STAGE OSTEOARTHRITIS OF RIGHT KNEE-SAME                                                                     Estimated body mass index is 30.81 kg/(m^2) as calculated from the following:   Height as of 09/02/14: 5\' 1"  (1.549 m).   Weight as of this encounter: 73.936 kg (163 lb).     PROCEDURE:  Procedure(s): RIGHT TOTAL KNEE ARTHROPLASTY      SURGEON:  [name], MD    ASSISTANT:   Biagio Borg, PA-C   (Present and scrubbed throughout the case, critical for assistance with exposure, retraction, instrumentation, and closure.)          ANESTHESIA: regional and spinal     DRAINS: (RIGHT KNEE) Hemovact drain(s) in the CLAMPED with  Suction Clamped :      TOURNIQUET TIME:  Total Tourniquet Time Documented: Thigh (laterality) - 68 minutes Total: Thigh (laterality) - 68 minutes     COMPLICATIONS:  None   CONDITION:  stable  PROCEDURE IN DETAIL: 248250    WHITFIELD, PETER W 09/14/2014, 1:06 PM

## 2014-09-14 NOTE — Anesthesia Postprocedure Evaluation (Signed)
  Anesthesia Post-op Note  Patient: Kimberly [name]  Procedure(s) Performed: Procedure(s): RIGHT TOTAL KNEE ARTHROPLASTY (Right)  Patient Location: PACU  Anesthesia Type:Spinal  Level of Consciousness: awake  Airway and Oxygen Therapy: Patient Spontanous Breathing  Post-op Pain: mild  Post-op Assessment: Post-op Vital signs reviewed  Post-op Vital Signs: Reviewed  Last Vitals:  Filed Vitals:   09/14/14 1645  BP: 127/70  Pulse: 61  Temp:   Resp: 13    Complications: No apparent anesthesia complications

## 2014-09-14 NOTE — Anesthesia Preprocedure Evaluation (Addendum)
Anesthesia Evaluation  Patient identified by MRN, date of birth, ID band Patient awake    Reviewed: Allergy & Precautions, NPO status , Patient's Chart, lab work & pertinent test results  Airway Mallampati: II  TM Distance: >3 FB Neck ROM: Full    Dental  (+) Teeth Intact, Dental Advisory Given   Pulmonary  breath sounds clear to auscultation        Cardiovascular hypertension, Rhythm:Regular Rate:Normal     Neuro/Psych    GI/Hepatic   Endo/Other    Renal/GU      Musculoskeletal   Abdominal   Peds  Hematology   Anesthesia Other Findings   Reproductive/Obstetrics                            Anesthesia Physical Anesthesia Plan  ASA: III  Anesthesia Plan: MAC and Spinal   Post-op Pain Management: MAC Combined w/ Regional for Post-op pain   Induction: Intravenous  Airway Management Planned: Natural Airway and Simple Face Mask  Additional Equipment:   Intra-op Plan:   Post-operative Plan:   Informed Consent: I have reviewed the patients History and Physical, chart, labs and discussed the procedure including the risks, benefits and alternatives for the proposed anesthesia with the patient or authorized representative who has indicated his/her understanding and acceptance.   Dental advisory given  Plan Discussed with: CRNA and Anesthesiologist  Anesthesia Plan Comments: (Htn H/O DVT DJD R. Knee  Plan Spinal with adductor canal block)        Anesthesia Quick Evaluation

## 2014-09-14 NOTE — Progress Notes (Signed)
Utilization review completed.  

## 2014-09-15 ENCOUNTER — Encounter (HOSPITAL_COMMUNITY): Payer: Self-pay | Admitting: Orthopaedic Surgery

## 2014-09-15 LAB — BASIC METABOLIC PANEL
ANION GAP: 8 (ref 5–15)
BUN: 6 mg/dL (ref 6–20)
CALCIUM: 8.9 mg/dL (ref 8.9–10.3)
CO2: 24 mmol/L (ref 22–32)
Chloride: 100 mmol/L — ABNORMAL LOW (ref 101–111)
Creatinine, Ser: 0.54 mg/dL (ref 0.44–1.00)
GFR calc non Af Amer: >60 mL/min (ref >60)
GLUCOSE: 130 mg/dL — AB (ref 70–99)
POTASSIUM: 4 mmol/L (ref 3.5–5.1)
Sodium: 132 mmol/L — ABNORMAL LOW (ref 135–145)

## 2014-09-15 LAB — CBC
HEMATOCRIT: 29.4 % — AB (ref 36.0–46.0)
Hemoglobin: 9.8 g/dL — ABNORMAL LOW (ref 12.0–15.0)
MCH: 29.3 pg (ref 26.0–34.0)
MCHC: 33.3 g/dL (ref 30.0–36.0)
MCV: 88 fL (ref 78.0–100.0)
PLATELETS: 231 10*3/uL (ref 150–400)
RBC: 3.34 MIL/uL — ABNORMAL LOW (ref 3.87–5.11)
RDW: 13.2 % (ref 11.5–15.5)
WBC: 7.7 10*3/uL (ref 4.0–10.5)

## 2014-09-15 NOTE — Progress Notes (Signed)
Patient ID: Kimberly [name], female   DOB: 1951/04/18, 64 y.o.   MRN: [id number] PATIENT ID: Kimberly [name]        MRN:  [id number]          DOB/AGE: 12-24-1950 / 64 y.o.    [name], MD   Biagio Borg, PA-C [address] [place], [place]  [address]                             [phone]   PROGRESS NOTE  Subjective:  negative for Chest Pain  negative for Shortness of Breath  negative for Nausea/Vomiting   negative for Calf Pain    Tolerating Diet: yes         Patient reports pain as mild.     States really no pain.  Objective: Vital signs in last 24 hours:   Patient Vitals for the past 24 hrs:  BP Temp Temp src Pulse Resp SpO2 Weight  09/15/14 0505 (!) 106/58 mmHg 97.8 F (36.6 C) - 71 15 97 % -  09/15/14 0134 120/63 mmHg 98.2 F (36.8 C) - 65 15 97 % -  09/14/14 2020 (!) 110/58 mmHg 97.6 F (36.4 C) - 61 15 97 % -  09/14/14 1759 139/62 mmHg 97.9 F (36.6 C) Oral 68 15 98 % -  09/14/14 1709 - 98.2 F (36.8 C) - - - - -  09/14/14 1707 - - - 68 14 99 % -  09/14/14 1645 127/70 mmHg - - 61 13 99 % -  09/14/14 1615 (!) 139/59 mmHg - - (!) 58 18 99 % -  09/14/14 1611 - - - 65 13 99 % -  09/14/14 1545 133/68 mmHg - - 65 15 100 % -  09/14/14 1515 (!) 142/69 mmHg - - (!) 58 13 99 % -  09/14/14 1500 (!) 142/60 mmHg - - (!) 55 14 99 % -  09/14/14 1445 (!) 131/57 mmHg - - (!) 54 12 99 % -  09/14/14 1430 129/61 mmHg - - (!) 56 12 99 % -  09/14/14 1415 (!) 132/52 mmHg - - 60 20 97 % -  09/14/14 1400 121/63 mmHg - - (!) 58 13 99 % -  09/14/14 1345 122/61 mmHg - - 63 14 97 % -  09/14/14 1338 - 97.5 F (36.4 C) - 63 12 97 % -  09/14/14 0926 - - - 63 13 100 % -  09/14/14 0925 - - - 67 15 100 % -  09/14/14 0924 (!) 151/75 mmHg - - 60 11 100 % -  09/14/14 0923 - - - 61 15 100 % -  09/14/14 0922 - - - 60 13 100 % -  09/14/14 0921 - - - (!) 57 10 100 % -  09/14/14 0920 - - - 60 14 100 % -  09/14/14 0919 139/84 mmHg - - 64 17 98 % -  09/14/14 0918 - - - (!) 58 13 100 % -    09/14/14 0917 - - - (!) 59 16 100 % -  09/14/14 0916 - - - 61 12 100 % -  09/14/14 0915 - - - 64 16 100 % -  09/14/14 0914 (!) 156/77 mmHg - - 63 17 100 % -  09/14/14 0913 - - - 65 16 100 % -  09/14/14 0912 - - - 60 16 100 % -  09/14/14 0911 - - - (!) 58 17 100 % -  09/14/14 0910 - - - (!) 58 17 100 % -  09/14/14 0909 (!) 157/93 mmHg - - (!) 57 15 100 % -  09/14/14 0908 - - - (!) 58 17 100 % -  09/14/14 0833 (!) 148/70 mmHg 98.3 F (36.8 C) Oral 61 20 99 % 73.936 kg (163 lb)      Intake/Output from previous day:   05/03 0701 - 05/04 0700 In: 3590 [P.O.:490; I.V.:3100] Out: 3265 [Urine:2575; Drains:590]   Intake/Output this shift:       Intake/Output      05/03 0701 - 05/04 0700 05/04 0701 - 05/05 0700   P.O. 490    I.V. (mL/kg) 3100 (41.9)    Total Intake(mL/kg) 3590 (48.6)    Urine (mL/kg/hr) 2575    Drains 590    Blood 100    Total Output 3265     Net +325             LABORATORY DATA:  Recent Labs  09/15/14 0605  WBC 7.7  HGB 9.8*  HCT 29.4*  PLT 231   No results for input(s): NA, K, CL, CO2, BUN, CREATININE, GLUCOSE, CALCIUM in the last 168 hours. Lab Results  Component Value Date   INR 1.06 09/02/2014   INR 1.01 10/07/2012    Recent Radiographic Studies :  Dg Chest 2 View  09/02/2014   CLINICAL DATA:  Preoperative exam prior to total knee replacement, nonsmoker, history of hypertension.  EXAM: CHEST  2 VIEW  COMPARISON:  PA and lateral chest x-ray of August 03, 2013  FINDINGS: The lungs are adequately inflated. There is no focal infiltrate. There is no pleural effusion. The heart and pulmonary vascularity are normal. The mediastinum is normal in width. The bony thorax is unremarkable.  IMPRESSION: There is no active cardiopulmonary disease.   Electronically Signed   By: David  Martinique M.D.   On: 09/02/2014 09:30     Examination:  General appearance: alert, cooperative and no distress Resp: clear to auscultation bilaterally Cardio: regular rate and  rhythm GI: normal findings: bowel sounds normal  Wound Exam: clean, dry, intact dressing  Drainage:  None: wound tissue dry  Motor Exam: EHL, FHL, Anterior Tibial and Posterior Tibial Intact  Sensory Exam: Superficial Peroneal, Deep Peroneal and Tibial normal  Vascular Exam: Right dorsalis pedis artery has trace pulse  Assessment:    1 Day Post-Op  Procedure(s) (LRB): RIGHT TOTAL KNEE ARTHROPLASTY (Right)  ADDITIONAL DIAGNOSIS:  Principal Problem:   Primary osteoarthritis of right knee Active Problems:   Primary osteoarthritis of knee  Acute Blood Loss Anemia   Plan: Physical Therapy as ordered Partial Weight Bearing @ 50% (PWB)  DVT Prophylaxis:  Xarelto, Foot Pumps and TED hose  DISCHARGE PLAN: Skilled Nursing Facility/Rehab  DISCHARGE NEEDS: HHPT, CPM, Walker and 3-in-1 comode seat   Plan [facility] on Friday        Abbegail Matuska  09/15/2014 8:13 AM

## 2014-09-15 NOTE — Evaluation (Signed)
Physical Therapy Evaluation Patient Details Name: Kimberly [name] MRN: [id number] DOB: Apr 17, 1951 Today's Date: 09/15/2014   History of Present Illness  Patient is a 64 y/o female s/p R TKA. PMH of HTN, DVT, osteopenia and anemia.  Clinical Impression  Patient presents with pain and post surgical deficits of RLE s/p above surgery impacting mobility. Education provided on knee precautions, WB restrictions and HEP. Pt compliant with PWB during functional activities today. Pt agreeable to rehab at d/c. Would benefit from skilled PT to improve transfers, gait, balance and mobility so pt can maximize independence and return to PLOF.    Follow Up Recommendations SNF    Equipment Recommendations  Rolling walker with 5" wheels    Recommendations for Other Services       Precautions / Restrictions Precautions Precautions: Knee;Fall Precaution Booklet Issued: Yes (comment) Precaution Comments: Reviewed not placing pillow under knee and HEP. Restrictions Weight Bearing Restrictions: Yes RLE Weight Bearing: Partial weight bearing RLE Partial Weight Bearing Percentage or Pounds: 50      Mobility  Bed Mobility Overal bed mobility: Needs Assistance Bed Mobility: Sidelying to Sit;Rolling Rolling: Min guard Sidelying to sit: Min assist;HOB elevated       General bed mobility comments: Min A to bring RLE to EOB. Cues for technique to scoot bottom to EOB. use of rails for support. + dizziness sitting EOB.  Transfers Overall transfer level: Needs assistance Equipment used: Rolling walker (2 wheeled) Transfers: Sit to/from Stand Sit to Stand: Min guard         General transfer comment: Min guard to rise from EOB x1, from chair x1. with cues for hand placement. Compliant with WB status RLE. + Dizziness standing. Resolved after 2-3 minutes. Transferred to chair.  Ambulation/Gait Ambulation/Gait assistance: Min guard Ambulation Distance (Feet): 40 Feet Assistive device: Rolling walker (2  wheeled) Gait Pattern/deviations: Step-to pattern;Decreased stance time - right;Decreased step length - left;Trunk flexed;Narrow base of support   Gait velocity interpretation: Below normal speed for age/gender General Gait Details: Pt with increased WB through [place]. Compliant with WB status RLE. Cues for RW management.  Stairs            Wheelchair Mobility    Modified Rankin (Stroke Patients Only)       Balance Overall balance assessment: Needs assistance Sitting-balance support: Feet supported;Single extremity supported Sitting balance-Leahy Scale: Fair     Standing balance support: During functional activity Standing balance-Leahy Scale: Poor Standing balance comment: Relient on RW.                             Pertinent Vitals/Pain Pain Assessment: 0-10 Pain Score: 3  Pain Location: right knee Pain Descriptors / Indicators: Sore Pain Intervention(s): Monitored during session;Repositioned    Home Living Family/patient expects to be discharged to:: Skilled nursing facility Living Arrangements: Spouse/significant other                    Prior Function Level of Independence: Independent               Hand Dominance        Extremity/Trunk Assessment   Upper Extremity Assessment: Defer to OT evaluation           Lower Extremity Assessment: RLE deficits/detail RLE Deficits / Details: Limited AROM knee flexion/ext and hip flexion. Ankle AROM WFL.       Communication   Communication: No difficulties  Cognition Arousal/Alertness: Awake/alert Behavior During Therapy:  WFL for tasks assessed/performed Overall Cognitive Status: Within Functional Limits for tasks assessed                      General Comments General comments (skin integrity, edema, etc.): Pt's family member present in room during session.    Exercises Total Joint Exercises Ankle Circles/Pumps: Both;10 reps;Seated Quad Sets: Both;10 reps;Seated Hip  ABduction/ADduction: Right;AAROM;5 reps;Supine Long Arc Quad: Right;AAROM;5 reps;Seated Knee Flexion: Right;5 reps;Seated;AAROM Goniometric ROM: 70 degrees knee flexion AROM. Lacking 15 degrees knee extension.      Assessment/Plan    PT Assessment Patient needs continued PT services  PT Diagnosis Difficulty walking;Acute pain   PT Problem List Decreased strength;Pain;Decreased range of motion;Decreased activity tolerance;Decreased balance;Decreased mobility  PT Treatment Interventions Balance training;Gait training;Stair training;Patient/family education;Functional mobility training;Therapeutic activities;Therapeutic exercise;DME instruction   PT Goals (Current goals can be found in the Care Plan section) Acute Rehab PT Goals Patient Stated Goal: to go to rehab to return to independence PT Goal Formulation: With patient Time For Goal Achievement: 09/29/14 Potential to Achieve Goals: Good    Frequency 7X/week (BID if time allows)   Barriers to discharge        Co-evaluation               End of Session Equipment Utilized During Treatment: Gait belt Activity Tolerance: Patient tolerated treatment well Patient left: in chair;with family/visitor present;with call bell/phone within reach Nurse Communication: Mobility status         Time: 7622-6333 PT Time Calculation (min) (ACUTE ONLY): 33 min   Charges:   PT Evaluation $Initial PT Evaluation Tier I: 1 Procedure PT Treatments $Therapeutic Activity: 8-22 mins   PT G CodesCandy Sledge A 09/23/14, 11:32 AM  Candy Sledge, PT, DPT [phone]

## 2014-09-15 NOTE — Clinical Social Work Note (Signed)
Clinical Social Work Assessment  Patient Details  Name: Kimberly [name] MRN: [id number] Date of Birth: 1951-04-22  Date of referral:  09/15/14               Reason for consult:  Facility Placement                Permission sought to share information with:  Family Supports Permission granted to share information::  Yes, Verbal Permission Granted  Name::     [place]::     Relationship::  Husband  Contact Information:  [phone]  Housing/Transportation Living arrangements for the past 2 months:  [place] of Information:  Patient, Spouse Patient Interpreter Needed:  None Criminal Activity/Legal Involvement Pertinent to Current Situation/Hospitalization:  No - Comment as needed Significant Relationships:  Spouse Lives with:  Spouse Do you feel safe going back to the place where you live?  Yes Need for family participation in patient care:  No (Coment)  Care giving concerns:  Patient nor patient's husband expressed concerns regarding patient's discharge.   Social Worker assessment / plan:  CSW received notification patient has pre-registered with [employer] prior to admission. CSW met with patient and patient's husband at bedside to confirm discharge disposition. Patient's husband informed CSW patient will be discharged on Friday, 09/17/2014, to [facility] via [place]. Patient's husband expressed preference for discharge prior to 10am. CSW informed patient's husband the medical team will strive to accomplish a morning discharge for patient. Patient's husband expressed understanding and appreciation.  Employment status:  Retired Forensic scientist:  Medicare PT Recommendations:  [place] / Referral to community resources:  [place]  Patient/Family's Response to care:  Patient and patient's husband understanding and agreeable to CSW plan of care.  Patient/Family's Understanding of and Emotional Response to  Diagnosis, Current Treatment, and Prognosis:  Patient and patient's husband understanding and agreeable to CSW plan of care.  Emotional Assessment Appearance:  Appears stated age Attitude/Demeanor/Rapport:  Other (Patient pleasant during assessment.) Affect (typically observed):  Appropriate, Pleasant Orientation:  Oriented to Self, Oriented to Place, Oriented to  Time, Oriented to Situation Alcohol / Substance use:  Not Applicable Psych involvement (Current and /or in the community):  No (Comment) (Not appropriate on this admission.)  Discharge Needs  Concerns to be addressed:  No discharge needs identified Readmission within the last 30 days:  No Current discharge risk:  None Barriers to Discharge:  No Barriers Identified   Caroline Sauger, LCSW 09/15/2014, 2:57 PM [phone]

## 2014-09-15 NOTE — Discharge Instructions (Signed)
Information on my medicine - XARELTO® (Rivaroxaban) ° °This medication education was reviewed with me or my healthcare representative as part of my discharge preparation.  The pharmacist that spoke with me during my hospital stay was:  Raylyn Speckman Dien, RPH ° °Why was Xarelto® prescribed for you? °Xarelto® was prescribed for you to reduce the risk of blood clots forming after orthopedic surgery. The medical term for these abnormal blood clots is venous thromboembolism (VTE). ° °What do you need to know about xarelto® ? °Take your Xarelto® ONCE DAILY at the same time every day. °You may take it either with or without food. ° °If you have difficulty swallowing the tablet whole, you may crush it and mix in applesauce just prior to taking your dose. ° °Take Xarelto® exactly as prescribed by your doctor and DO NOT stop taking Xarelto® without talking to the doctor who prescribed the medication.  Stopping without other VTE prevention medication to take the place of Xarelto® may increase your risk of developing a clot. ° °After discharge, you should have regular check-up appointments with your healthcare provider that is prescribing your Xarelto®.   ° °What do you do if you miss a dose? °If you miss a dose, take it as soon as you remember on the same day then continue your regularly scheduled once daily regimen the next day. Do not take two doses of Xarelto® on the same day.  ° °Important Safety Information °A possible side effect of Xarelto® is bleeding. You should call your healthcare provider right away if you experience any of the following: °? Bleeding from an injury or your nose that does not stop. °? Unusual colored urine (red or dark brown) or unusual colored stools (red or black). °? Unusual bruising for unknown reasons. °? A serious fall or if you hit your head (even if there is no bleeding). ° °Some medicines may interact with Xarelto® and might increase your risk of bleeding while on Xarelto®. To help avoid  this, consult your healthcare provider or pharmacist prior to using any new prescription or non-prescription medications, including herbals, vitamins, non-steroidal anti-inflammatory drugs (NSAIDs) and supplements. ° °This website has more information on Xarelto®: www.xarelto.com. ° ° ° °

## 2014-09-15 NOTE — Progress Notes (Signed)
Orthopedic Tech Progress Note Patient Details:  Kimberly [name] 1951-04-05 [id number] On cpm at 8:30 pm 0-60 Patient ID: Kimberly [name], female   DOB: [birth year], 64 y.o.   MRN: [id number]   Kimberly [name] 09/15/2014, 8:31 PM

## 2014-09-15 NOTE — Op Note (Signed)
NAMEDINESHA, TWIGGS                 ACCOUNT NO.:  [id number]  MEDICAL RECORD NO.:  [id number]  LOCATION:  5N23C                        FACILITY:  [facility]  PHYSICIAN:  Vonna Kotyk. Sahan Pen, M.D.DATE OF BIRTH:  23-Jun-1950  DATE OF PROCEDURE:  09/14/2014 DATE OF DISCHARGE:                              OPERATIVE REPORT   PREOPERATIVE DIAGNOSIS:  Primary, end-stage osteoarthritis, right knee.  POSTOPERATIVE DIAGNOSIS:  Primary, end-stage osteoarthritis, right knee.  PROCEDURE:  Right total knee replacement.  SURGEON:  Vonna Kotyk. Durward Fortes, M.D.  ASSISTANT:  Aaron Edelman D. Petrarca, PA-C.  ANESTHESIA:  Spinal with supplemental adductor canal block.  COMPLICATIONS:  None.  COMPONENTS:  DePuy LCS standard femoral component, a #20.5 rotating keeled tibial tray with a 10-mm polyethylene bridging-bearing, a metal- backed 3-peg rotating patellar components secured with polymethyl methacrylate.  DESCRIPTION OF PROCEDURE:  Ms. Fontes was met in the holding area with her husband and identified the right knee as the appropriate operative site.  Anesthesia performed an adductor canal block.  The patient was then transported to room #7 and was given a spinal anesthetic without difficulty and on IV sedation.  Nursing staff inserted a Foley catheter.  Urine was clear.  Tourniquet was applied to the right thigh and the right leg was then prepped with chlorhexidine scrub and DuraPrep x2 from the tourniquet to the tips of the toes.  Sterile draping was performed.  Time-out was called.  With the extremity elevated, it was Esmarch exsanguinated with a proximal tourniquet at 350 mmHg.  A midline longitudinal incision was made centered about the patella. Via sharp dissection, incision was carried down to subcutaneous tissue. Small bleeders were Bovie coagulated.  First layer of capsule was incised in the midline.  A medial parapatellar incision was then made with the Bovie.  The joint was entered.  There  was minimal effusion. There was just mild to moderate synovitis.  Synovectomy was performed.  There were moderate-sized osteophytes along the medial and lateral femoral condyles.  A fixed varus position and slight flexion contracture.  I did a medial release and then removed the osteophytes from the medial femoral condyle.  I measured a standard femoral component.  First, bony cut was then made traversing the proximal tibia with a 7- degree angle of declination.  Subsequent cuts were then made on the femur using the standard femoral jig.  I inserted a lamina spreader along the medial and lateral femoral condyles so that I could  remove the medial and lateral menisci, ACL and PCL, and osteophytes from the posterior femoral condyle medially and laterally.  I measured the flexion-extension gaps symmetrically at 10 mm.  Final cuts were then made on the tibia with the standard jig with a 4-degree distal femoral valgus cut and the final tapered cuts.  Again, I checked flexion- extension gaps, they were perfectly symmetrical.  At each bony cut on the femur and the tibia, I did check my external alignment and felt that it was anatomic.  Retractor was then placed about the tibia, was advanced anteriorly and measured 2.5 rotating keeled tray, this was pinned in place.  A center hole was made, then followed by the keeled cut.  With the tibial jig in place, a 10-mm polyethylene bridging bearing was inserted, followed by the standard trial femoral component.  This was reduced and through a full range of motion it was perfectly stable.  I had excellent range of motion without instability of the malrotation of the tibial tray.  The patella was prepared by removing 10 mm of bone leaving 13 mm of patellar thickness.  The trial patella was inserted and would sublux laterally as the lateral capsule was quite tight and a lateral release was performed at which point, the patella remained stable.  At  that point, the trial components removed.  We copiously irrigated the joint with sterile saline.  With the knee flexed 90 degrees, the final components were inserted.  I initially inserted the #2.5 rotating keeled tibial tray, followed by the 10-mm polyethylene bridging bearing, and the standard femoral component. Extraneous methacrylate was removed from the periphery of each of the components.  Patella was applied with methacrylate and a bone clamp.  Approximately, 16 minutes of methacrylate had matured, during which time, we injected the joint with 0.25% Marcaine with epinephrine.  We then inspected the joint.  It was clear of any loose material.  We had excellent range of motion, no instability, patella tracked in the midline.  Tourniquet was deflated at 68 minutes.  Gross bleeders were Bovie coagulated.  Any bleeding bone was controlled with the bone wax.  A medium-size Hemovac was then placed in the lateral capsule.  The deep capsule was closed with #1 Ethibond, superficial capsule closed with running 0 Vicryl, subcu with 3-0 Vicryl, and 3-0 Monocryl.  Skin closed with skin clips.  Sterile bulky dressing was applied, followed by the patient's support stocking.  The patient tolerated the procedure without complications.     Vonna Kotyk. Durward Fortes, M.D.     PWW/MEDQ  D:  09/14/2014  T:  09/15/2014  Job:  [id number]

## 2014-09-15 NOTE — Progress Notes (Signed)
OT Cancellation Note  Patient Details Name: Kimberly [name] MRN: [id number] DOB: 1950-12-31   Cancelled Treatment:    Reason Eval/Treat Not Completed: Other (comment) Pt has Medicare and current D/C plan is SNF. No apparent immediate acute care OT needs, therefore will defer OT to SNF. If OT eval is needed please call Acute Rehab Dept. at [phone] or text page OT at [phone].    Villa Herb M   Cyndie Chime, OTR/L Occupational Therapist [phone] (pager)  09/15/2014, 11:44 AM

## 2014-09-15 NOTE — Progress Notes (Signed)
Orthopedic Tech Progress Note Patient Details:  Kimberly [name] 19-Dec-1950 [id number]  Patient ID: Kimberly [name], female   DOB: 1950-07-02, 64 y.o.   MRN: [id number] Placed pt's rle in cpm @0 -60 degrees @1415   Kimberly [name] 09/15/2014, 2:20 PM

## 2014-09-16 LAB — CBC
HCT: 28.2 % — ABNORMAL LOW (ref 36.0–46.0)
Hemoglobin: 9.5 g/dL — ABNORMAL LOW (ref 12.0–15.0)
MCH: 29.6 pg (ref 26.0–34.0)
MCHC: 33.7 g/dL (ref 30.0–36.0)
MCV: 87.9 fL (ref 78.0–100.0)
Platelets: 211 10*3/uL (ref 150–400)
RBC: 3.21 MIL/uL — ABNORMAL LOW (ref 3.87–5.11)
RDW: 13.2 % (ref 11.5–15.5)
WBC: 7.8 10*3/uL (ref 4.0–10.5)

## 2014-09-16 LAB — BASIC METABOLIC PANEL
Anion gap: 6 (ref 5–15)
BUN: 5 mg/dL — AB (ref 6–20)
CALCIUM: 8.6 mg/dL — AB (ref 8.9–10.3)
CHLORIDE: 106 mmol/L (ref 101–111)
CO2: 25 mmol/L (ref 22–32)
CREATININE: 0.57 mg/dL (ref 0.44–1.00)
GLUCOSE: 155 mg/dL — AB (ref 70–99)
Potassium: 3.7 mmol/L (ref 3.5–5.1)
SODIUM: 137 mmol/L (ref 135–145)

## 2014-09-16 MED ORDER — RIVAROXABAN 10 MG PO TABS: 10.0000 mg | ORAL_TABLET | Freq: Every day | ORAL | Status: DC

## 2014-09-16 MED ORDER — OXYCODONE HCL 5 MG PO TABS: 5.0000 mg | ORAL_TABLET | ORAL | Status: DC | PRN

## 2014-09-16 NOTE — Progress Notes (Signed)
Orthopedic Tech Progress Note Patient Details:  Tarra Pence Apr 03, 1951 [id number]  Patient ID: Kimberly [name], female   DOB: 05-01-1951, 63 y.o.   MRN: [id number] Placed pt's rle in cpm @ 0-70 degrees @1420   Hildred Priest 09/16/2014, 2:18 PM

## 2014-09-16 NOTE — Progress Notes (Signed)
Physical Therapy Treatment Patient Details Name: Kimberly Kimberly [name] MRN: [id number] DOB: [birth year] Today's Date: 09/16/2014    History of Present Illness Patient is Kimberly [name] 64 y/o female s/p R TKA. PMH of HTN, DVT, osteopenia and anemia.    PT Comments    Patient progressing well towards therapy goals. Improved ambulation distance. Compliant with PWB RLE during functional activities. Knee AROM 7-80 degrees. Reviewed HEP. Encouraged ambulation later in day with RN. Will continue to follow and progress as tolerated. Appropriate for ST SNF to improve functional independence.   Follow Up Recommendations  SNF     Equipment Recommendations  Rolling walker with 5" wheels    Recommendations for Other Services       Precautions / Restrictions Precautions Precautions: Knee;Fall Precaution Booklet Issued: Yes (comment) Precaution Comments: Reviewed not placing pillow under knee and HEP. Restrictions Weight Bearing Restrictions: Yes RLE Weight Bearing: Partial weight bearing RLE Partial Weight Bearing Percentage or Pounds: 50    Mobility  Bed Mobility               General bed mobility comments: Sitting in chair upon PT arrival.   Transfers Overall transfer level: Needs assistance Equipment used: Rolling walker (2 wheeled) Transfers: Sit to/from Stand Sit to Stand: Min guard         General transfer comment: Min guard for safety. Compliant with WB status RLE.   Ambulation/Gait Ambulation/Gait assistance: Min guard Ambulation Distance (Feet): 100 Feet Assistive device: Rolling walker (2 wheeled) Gait Pattern/deviations: Step-through pattern;Decreased stance time - right;Decreased step length - left;Trunk flexed   Gait velocity interpretation: Below normal speed for age/gender General Gait Details: Pt with increased WB through BUEs. Compliant with WB status RLE. Cues for RW management. 1 standing rest break. Cues for knee flexion during swing phase of gait.   Stairs             Wheelchair Mobility    Modified Rankin (Stroke Patients Only)       Balance Overall balance assessment: Needs assistance Sitting-balance support: Feet supported;No upper extremity supported Sitting balance-Leahy Scale: Good     Standing balance support: During functional activity Standing balance-Leahy Scale: Fair                      Cognition Arousal/Alertness: Awake/alert Behavior During Therapy: WFL for tasks assessed/performed Overall Cognitive Status: Within Functional Limits for tasks assessed                      Exercises Total Joint Exercises Ankle Circles/Pumps: Both;15 reps;Seated Quad Sets: Right;10 reps;Seated Knee Flexion: Right;5 reps;Seated Goniometric ROM: 7-80 degrees    General Comments        Pertinent Vitals/Pain Pain Assessment: 0-10 Pain Score: 2  Pain Location: right knee Pain Descriptors / Indicators: Sore Pain Intervention(s): Monitored during session;Premedicated before session;Repositioned    Home Living                      Prior Function            PT Goals (current goals can now be found in the care plan section) Progress towards PT goals: Progressing toward goals    Frequency  7X/week    PT Plan Current plan remains appropriate    Co-evaluation             End of Session Equipment Utilized During Treatment: Gait belt Activity Tolerance: Patient tolerated treatment well Patient left: in chair;with call bell/phone  within reach;with family/visitor present     Time: 0928-0958 PT Time Calculation (min) (ACUTE ONLY): 30 min  Charges:  $Gait Training: 8-22 mins $Therapeutic Exercise: 8-22 mins                    G CodesCandy Kimberly [name] Kimberly [name] 2014-10-04, 10:24 AM Wray Kearns, PT, DPT [phone]

## 2014-09-16 NOTE — Progress Notes (Signed)
Patient ID: Kimberly [name], female   DOB: Jun 10, 1950, 64 y.o.   MRN: [id number] PATIENT ID: Kimberly [name]        MRN:  [id number]          DOB/AGE: 1950-09-20 / 64 y.o.    Kimberly Fears, MD   Biagio Borg, PA-C [address] [place], [place]  [address]                             [phone]   PROGRESS NOTE  Subjective:  negative for Chest Pain  negative for Shortness of Breath  negative for Nausea/Vomiting   negative for Calf Pain    Tolerating Diet: yes         Patient reports pain as mild and moderate.     Doing well.  No major complaints   Objective: Vital signs in last 24 hours:   Patient Vitals for the past 24 hrs:  BP Temp Pulse Resp SpO2  09/16/14 0600 (!) 142/62 mmHg 98.3 F (36.8 C) 74 16 99 %  09/15/14 2027 (!) 159/55 mmHg 98.1 F (36.7 C) 90 16 100 %  09/15/14 1400 139/65 mmHg 98.3 F (36.8 C) 73 16 98 %      Intake/Output from previous day:   05/04 0701 - 05/05 0700 In: 1320 [P.O.:1320] Out: 250 [Urine:250]   Intake/Output this shift:   05/05 0701 - 05/05 1900 In: 360 [P.O.:360] Out: -    Intake/Output      05/04 0701 - 05/05 0700 05/05 0701 - 05/06 0700   P.O. 1320 360   I.V. (mL/kg)     Total Intake(mL/kg) 1320 (17.9) 360 (4.9)   Urine (mL/kg/hr) 250 (0.1)    Drains     Blood     Total Output 250     Net +1070 +360        Urine Occurrence 5 x       LABORATORY DATA:  Recent Labs  09/15/14 0605 09/16/14 0415  WBC 7.7 7.8  HGB 9.8* 9.5*  HCT 29.4* 28.2*  PLT 231 211    Recent Labs  09/15/14 0605 09/16/14 0415  NA 132* 137  K 4.0 3.7  CL 100* 106  CO2 24 25  BUN 6 5*  CREATININE 0.54 0.57  GLUCOSE 130* 155*  CALCIUM 8.9 8.6*   Lab Results  Component Value Date   INR 1.06 09/02/2014   INR 1.01 10/07/2012    Recent Radiographic Studies :  Dg Chest 2 View  09/02/2014   CLINICAL DATA:  Preoperative exam prior to total knee replacement, nonsmoker, history of hypertension.  EXAM: CHEST  2 VIEW  COMPARISON:  PA and lateral  chest x-ray of August 03, 2013  FINDINGS: The lungs are adequately inflated. There is no focal infiltrate. There is no pleural effusion. The heart and pulmonary vascularity are normal. The mediastinum is normal in width. The bony thorax is unremarkable.  IMPRESSION: There is no active cardiopulmonary disease.   Electronically Signed   By: David  Martinique M.D.   On: 09/02/2014 09:30     Examination:  General appearance: alert, cooperative, mild distress and moderate distress  Wound Exam: clean, dry, intact dressing changed  Drainage:  None: wound tissue dry  Motor Exam: EHL, FHL, Anterior Tibial and Posterior Tibial Intact  Sensory Exam: Superficial Peroneal, Deep Peroneal and Tibial normal  Vascular Exam: Right posterior tibial artery has 1+ (weak) pulse  Assessment:    2 Days  Post-Op  Procedure(s) (LRB): RIGHT TOTAL KNEE ARTHROPLASTY (Right)  ADDITIONAL DIAGNOSIS:  Principal Problem:   Primary osteoarthritis of right knee Active Problems:   Primary osteoarthritis of knee     Plan: Physical Therapy as ordered Partial Weight Bearing @ 50% (PWB)  DVT Prophylaxis:  Xarelto, Foot Pumps and TED hose  DISCHARGE PLAN: Skilled Nursing Facility/Rehab [facility] tomorrow  DISCHARGE NEEDS: HHPT, CPM, Walker and 3-in-1 comode seat        PETRARCA,BRIAN  09/16/2014 12:14 PM

## 2014-09-16 NOTE — Progress Notes (Signed)
Orthopedic Tech Progress Note Patient Details:  Kimberly [name] 11-12-1950 [id number] On cpm at 7:30 pm 0-70 Patient ID: Kimberly [name], female   DOB: 1951-04-09, 64 y.o.   MRN: [id number]   [name] 09/16/2014, 7:30 PM

## 2014-09-16 NOTE — Discharge Summary (Signed)
[name], MD   Biagio Borg, PA-C [address] [place], [place]  [address]                             [phone]  PATIENT ID: Kimberly [name]        MRN:  [id number]          DOB/AGE: 1951/02/03 / 64 y.o.    DISCHARGE SUMMARY  ADMISSION DATE:    09/14/2014 DISCHARGE DATE:   09/17/2014   ADMISSION DIAGNOSIS: END STAGE OSTEOARTHRITIS OF RIGHT KNEE    DISCHARGE DIAGNOSIS:  END STAGE OSTEOARTHRITIS OF RIGHT KNEE    ADDITIONAL DIAGNOSIS: Principal Problem:   Primary osteoarthritis of right knee Active Problems:   Primary osteoarthritis of knee  Past Medical History  Diagnosis Date  . Anemia 2005    hb 11.5  . Hypertension   . Hypothyroidism     off meds  . DVT (deep venous thrombosis)     2003, coumadin x 6 mon  . GERD (gastroesophageal reflux disease) 2001    no problem now  . Arthritis   . Osteopenia   . Vegetarian diet     eats fish and eggs; no meat    PROCEDURE: Procedure(s): RIGHT TOTAL KNEE ARTHROPLASTY Right on 09/14/2014  CONSULTS: none     HISTORY: Odalis is a very pleasant 64 year old female who is seen today for evaluation of her right knee. She does not remember any history of injury or trauma recently to her knees, but she has had both of her knees become quite symptomatic with pain and discomfort. Ten years ago she did have viscosupplementation that of Synvisc. She did have a right knee arthroscopy by Dr. Alphonzo Cruise in 2004 for which she states she had done fairly well overall. She has used Mobic which has been somewhat helpful. She did see Dr. Sheldon Silvan in February and had a cortisone injection to her left knee, but not her right knee. She does use Tylenol and Mobic again which is only helpful to a point. This has been a chronic long term problem for years, but unfortunately over the past 6 months her symptoms have worsened. She is now having pain with every step. She has tried conservative measures, but this is not helpful. She does have pain  at nighttime and pain with activities of daily living. She states she has a little bit of instability feeling and certainly occasional catching and locking type symptoms which have been very chronic over the 10 years, but has worsened over the last 6 months. Her pain is now to the point where she has pain with all activity and at nighttime  HOSPITAL COURSE:  Lillionna Nabi is a 64 y.o. admitted on 09/14/2014 and found to have a diagnosis of [place].  After appropriate laboratory studies were obtained  they were taken to the operating room on 09/14/2014 and underwent  Procedure(s): RIGHT TOTAL KNEE ARTHROPLASTY  Right.   They were given perioperative antibiotics:  Anti-infectives    Start     Dose/Rate Route Frequency Ordered Stop   09/14/14 1800  ceFAZolin (ANCEF) IVPB 2 g/50 mL premix     2 g 100 mL/hr over 30 Minutes Intravenous Every 6 hours 09/14/14 1736 09/15/14 0115   09/14/14 0600  ceFAZolin (ANCEF) IVPB 2 g/50 mL premix     2 g 100 mL/hr over 30 Minutes Intravenous On call to O.R. 09/13/14 1443 09/14/14 1131    .  Tolerated the procedure well.  Placed with a foley intraoperatively.     Toradol was given post op.  POD #1, allowed out of bed to a chair.  PT for ambulation and exercise program.  Foley D/C'd in morning.  IV saline locked.  O2 discontionued. Hemovac pulled.  POD #2, continued PT and ambulation.    POD #3, less pain and feeling better.  The remainder of the hospital course was dedicated to ambulation and strengthening.   The patient was discharged on 3 Days Post-Op in  Stable condition.  Blood products given:none  DIAGNOSTIC STUDIES: Recent vital signs:  Patient Vitals for the past 24 hrs:  BP Temp Pulse Resp SpO2  09/17/14 0537 (!) 140/56 mmHg 98 F (36.7 C) 79 18 96 %  09/16/14 2126 140/60 mmHg 99.7 F (37.6 C) 81 18 98 %  09/16/14 1400 140/60 mmHg 98.6 F (37 C) 80 18 98 %       Recent laboratory studies:  Recent Labs   09/15/14 0605 09/16/14 0415 09/17/14 0453  WBC 7.7 7.8 7.8  HGB 9.8* 9.5* 9.4*  HCT 29.4* 28.2* 27.2*  PLT 231 211 221    Recent Labs  09/15/14 0605 09/16/14 0415 09/17/14 0453  NA 132* 137 135  K 4.0 3.7 3.5  CL 100* 106 102  CO2 24 25 25   BUN 6 5* 6  CREATININE 0.54 0.57 0.50  GLUCOSE 130* 155* 119*  CALCIUM 8.9 8.6* 8.6*   Lab Results  Component Value Date   INR 1.06 09/02/2014   INR 1.01 10/07/2012     Recent Radiographic Studies :  Dg Chest 2 View  09/02/2014   CLINICAL DATA:  Preoperative exam prior to total knee replacement, nonsmoker, history of hypertension.  EXAM: CHEST  2 VIEW  COMPARISON:  PA and lateral chest x-ray of August 03, 2013  FINDINGS: The lungs are adequately inflated. There is no focal infiltrate. There is no pleural effusion. The heart and pulmonary vascularity are normal. The mediastinum is normal in width. The bony thorax is unremarkable.  IMPRESSION: There is no active cardiopulmonary disease.   Electronically Signed   By: David  Martinique M.D.   On: 09/02/2014 09:30    DISCHARGE INSTRUCTIONS:     Discharge Instructions    CPM    Complete by:  As directed   Continuous passive motion machine (CPM):      Use the CPM from 0 to 60 for 6-8 hours per day.      You may increase by 5-10 degrees per day.  You may break it up into 2 or 3 sessions per day.      Use CPM for 3-4  weeks or until you are told to stop.     Call MD / Call 911    Complete by:  As directed   If you experience chest pain or shortness of breath, CALL 911 and be transported to the hospital emergency room.  If you develope a fever above 101 F, pus (white drainage) or increased drainage or redness at the wound, or calf pain, call your surgeon's office.     Change dressing    Complete by:  As directed   DO NOT CHANGE YOUR DRESSING     Constipation Prevention    Complete by:  As directed   Drink plenty of fluids.  Prune juice may be helpful.  You may use a stool softener, such as  Colace (over the counter) 100 mg twice a day.  Use  MiraLax (over the counter) for constipation as needed.     Diet general    Complete by:  As directed      Discharge instructions    Complete by:  As directed   [place] items at home which could result in a fall. This includes throw rugs or furniture in walking pathways ICE to the affected joint every three hours while awake for 30 minutes at a time, for at least the first 3-5 days, and then as needed for pain and swelling.  Continue to use ice for pain and swelling. You may notice swelling that will progress down to the foot and ankle.  This is normal after surgery.  Elevate your leg when you are not up walking on it.   Continue to use the breathing machine you got in the hospital (incentive spirometer) which will help keep your temperature down.  It is common for your temperature to cycle up and down following surgery, especially at night when you are not up moving around and exerting yourself.  The breathing machine keeps your lungs expanded and your temperature down.   DIET:  As you were doing prior to hospitalization, we recommend a well-balanced diet.  DRESSING / WOUND CARE / SHOWERING  Keep the surgical dressing until follow up.  The dressing is water proof, so you can shower without any extra covering.  IF THE DRESSING FALLS OFF or the wound gets wet inside, change the dressing with sterile gauze.  Please use good hand washing techniques before changing the dressing.  Do not use any lotions or creams on the incision until instructed by your surgeon.    ACTIVITY  Increase activity slowly as tolerated, but follow the weight bearing instructions below.   No driving for 6 weeks or until further direction given by your physician.  You cannot drive while taking narcotics.  No lifting or carrying greater than 10 lbs. until further directed by your surgeon. Avoid periods of inactivity such as sitting longer  than an hour when not asleep. This helps prevent blood clots.  You may return to work once you are authorized by your doctor.     WEIGHT BEARING   Partial weight bearing with assist device as directed.     EXERCISES  Results after joint replacement surgery are often greatly improved when you follow the exercise, range of motion and muscle strengthening exercises prescribed by your doctor. Safety measures are also important to protect the joint from further injury. Any time any of these exercises cause you to have increased pain or swelling, decrease what you are doing until you are comfortable again and then slowly increase them. If you have problems or questions, call your caregiver or physical therapist for advice.   Rehabilitation is important following a joint replacement. After just a few days of immobilization, the muscles of the leg can become weakened and shrink (atrophy).  These exercises are designed to build up the tone and strength of the thigh and leg muscles and to improve motion. Often times heat used for twenty to thirty minutes before working out will loosen up your tissues and help with improving the range of motion but do not use heat for the first two weeks following surgery (sometimes heat can increase post-operative swelling).   These exercises can be done  on a bed. Use whatever works the best and is most comfortable for you.    Use music or television while you are exercising  so that the exercises are a pleasant break in your day. This will make your life better with the exercises acting as a break in your routine that you can look forward to.   Perform all exercises about fifteen times, three times per day or as directed.  You should exercise both the operative leg and the other leg as well.   Exercises include:   Quad Sets - Tighten up the muscle on the front of the thigh (Quad) and hold for 5-10 seconds.   Straight Leg Raises - With your knee straight (if you were  given a brace, keep it on), lift the leg to 60 degrees, hold for 3 seconds, and slowly lower the leg.  Perform this exercise against resistance later as your leg gets stronger.  Leg Slides: Lying on your back, slowly slide your foot toward your buttocks, bending your knee up off the floor (only go as far as is comfortable). Then slowly slide your foot back down until your leg is flat on the floor again.  Angel Wings: Lying on your back spread your legs to the side as far apart as you can without causing discomfort.  Hamstring Strength:  Lying on your back, push your heel against the floor with your leg straight by tightening up the muscles of your buttocks.  Repeat, but this time bend your knee to a comfortable angle, and push your heel against the floor.  You may put a pillow under the heel to make it more comfortable if necessary.   A rehabilitation program following joint replacement surgery can speed recovery and prevent re-injury in the future due to weakened muscles. Contact your doctor or a physical therapist for more information on knee rehabilitation.    CONSTIPATION  Constipation is defined medically as fewer than three stools per week and severe constipation as less than one stool per week.  Even if you have a regular bowel pattern at home, your normal regimen is likely to be disrupted due to multiple reasons following surgery.  Combination of anesthesia, postoperative narcotics, change in appetite and fluid intake all can affect your bowels.   YOU MUST use at least one of the following options; they are listed in order of increasing strength to get the job done.  They are all available over the counter, and you may need to use some, POSSIBLY even all of these options:    Drink plenty of fluids (prune juice may be helpful) and high fiber foods Colace 100 mg by mouth twice a day  Senokot for constipation as directed and as needed Dulcolax (bisacodyl), take with full glass of water  Miralax  (polyethylene glycol) once or twice a day as needed.  If you have tried all these things and are unable to have a bowel movement in the first 3-4 days after surgery call either your surgeon or your primary doctor.    If you experience loose stools or diarrhea, hold the medications until you stool forms back up.  If your symptoms do not get better within 1 week or if they get worse, check with your doctor.  If you experience "the worst abdominal pain ever" or develop nausea or vomiting, please contact the office immediately for further recommendations for treatment.   ITCHING:  If you experience itching with your medications, try taking only a single pain pill, or even half a pain pill at a time.  You can also use Benadryl over the counter for itching or also to help with  sleep.   TED HOSE STOCKINGS:  Use stockings on both legs until for at least 2 weeks or as directed by physician office. They may be removed at night for sleeping.  MEDICATIONS:  See your medication summary on the "After Visit Summary" that nursing will review with you.  You may have some home medications which will be placed on hold until you complete the course of blood thinner medication.  It is important for you to complete the blood thinner medication as prescribed.  PRECAUTIONS:  If you experience chest pain or shortness of breath - call 911 immediately for transfer to the hospital emergency department.   If you develop a fever greater that 101 F, purulent drainage from wound, increased redness or drainage from wound, foul odor from the wound/dressing, or calf pain - CONTACT YOUR SURGEON.                                                   FOLLOW-UP APPOINTMENTS:  If you do not already have a post-op appointment, please call the office for an appointment to be seen by your surgeon.  Guidelines for how soon to be seen are listed in your "After Visit Summary", but are typically between 1-4 weeks after surgery.  OTHER  INSTRUCTIONS:   Knee Replacement:  Do not place pillow under knee, focus on keeping the knee straight while resting. CPM instructions: 0-90 degrees, 2 hours in the morning, 2 hours in the afternoon, and 2 hours in the evening. Place foam block, curve side up under heel at all times except when in CPM or when walking.  DO NOT modify, tear, cut, or change the foam block in any way.  MAKE SURE YOU:  Understand these instructions.  Get help right away if you are not doing well or get worse.    Thank you for letting us be a part of your medical care team.  It is a privilege we respect greatly.  We hope these instructions will help you stay on track for a fast and full recovery!     Do not put a pillow under the knee. Place it under the heel.    Complete by:  As directed      Driving restrictions    Complete by:  As directed   No driving for 6 weeks     Increase activity slowly as tolerated    Complete by:  As directed      Lifting restrictions    Complete by:  As directed   No lifting for 6 weeks     Partial weight bearing    Complete by:  As directed   % Body Weight:  50%  Laterality:  right  Extremity:  Lower     Patient may shower    Complete by:  As directed   You may shower over the brown dressing     TED hose    Complete by:  As directed   Use stockings (TED hose) for 2-3 weeks on right leg.  You may remove them at night for sleeping.           DISCHARGE MEDICATIONS:     Medication List    STOP taking these medications        FISH OIL PO     meloxicam 15 MG tablet  Commonly known as:  MOBIC     TURMERIC PO      TAKE these medications        albuterol 108 (90 BASE) MCG/ACT inhaler  Commonly known as:  PROVENTIL HFA;VENTOLIN HFA  Inhale 2 puffs into the lungs every 6 (six) hours as needed for wheezing or shortness of breath.     calcium carbonate 600 MG Tabs tablet  Commonly known as:  OS-CAL  Take 600 mg by mouth 2 (two) times daily with a meal.      cholecalciferol 1000 UNITS tablet  Commonly known as:  VITAMIN D  Take 1,000 Units by mouth daily.     irbesartan 150 MG tablet  Commonly known as:  AVAPRO  Take 1 tablet (150 mg total) by mouth daily.     levothyroxine 50 MCG tablet  Commonly known as:  SYNTHROID, LEVOTHROID  TAKE ONE TABLET BY MOUTH ONCE DAILY     oxyCODONE 5 MG immediate release tablet  Commonly known as:  Oxy IR/ROXICODONE  Take 1-2 tablets (5-10 mg total) by mouth every 4 (four) hours as needed for breakthrough pain.     rivaroxaban 10 MG Tabs tablet  Commonly known as:  XARELTO  Take 1 tablet (10 mg total) by mouth daily with breakfast.        FOLLOW UP VISIT:   Follow-up Information    Follow up with [facility], Vonna Kotyk, MD. Schedule an appointment as soon as possible for a visit on 09/27/2014.   Specialty:  Orthopedic Surgery   Contact information:   [address] [place] [address] [phone]       DISPOSITION:   Skilled Nursing Facility/Rehab  CONDITION:  Stable   Mike Craze. [place], [place] [phone]  09/17/2014 8:11 AM

## 2014-09-17 DIAGNOSIS — M1990 Unspecified osteoarthritis, unspecified site: Secondary | ICD-10-CM | POA: Diagnosis not present

## 2014-09-17 DIAGNOSIS — E559 Vitamin D deficiency, unspecified: Secondary | ICD-10-CM | POA: Diagnosis not present

## 2014-09-17 DIAGNOSIS — Z471 Aftercare following joint replacement surgery: Secondary | ICD-10-CM | POA: Diagnosis not present

## 2014-09-17 DIAGNOSIS — K219 Gastro-esophageal reflux disease without esophagitis: Secondary | ICD-10-CM | POA: Diagnosis not present

## 2014-09-17 DIAGNOSIS — E039 Hypothyroidism, unspecified: Secondary | ICD-10-CM | POA: Diagnosis not present

## 2014-09-17 DIAGNOSIS — Z96651 Presence of right artificial knee joint: Secondary | ICD-10-CM | POA: Diagnosis not present

## 2014-09-17 DIAGNOSIS — D62 Acute posthemorrhagic anemia: Secondary | ICD-10-CM | POA: Diagnosis not present

## 2014-09-17 DIAGNOSIS — M6281 Muscle weakness (generalized): Secondary | ICD-10-CM | POA: Diagnosis not present

## 2014-09-17 DIAGNOSIS — M8580 Other specified disorders of bone density and structure, unspecified site: Secondary | ICD-10-CM | POA: Diagnosis not present

## 2014-09-17 DIAGNOSIS — I10 Essential (primary) hypertension: Secondary | ICD-10-CM | POA: Diagnosis not present

## 2014-09-17 DIAGNOSIS — M25461 Effusion, right knee: Secondary | ICD-10-CM | POA: Diagnosis not present

## 2014-09-17 DIAGNOSIS — M25561 Pain in right knee: Secondary | ICD-10-CM | POA: Diagnosis not present

## 2014-09-17 DIAGNOSIS — M1711 Unilateral primary osteoarthritis, right knee: Secondary | ICD-10-CM | POA: Diagnosis not present

## 2014-09-17 DIAGNOSIS — Z86718 Personal history of other venous thrombosis and embolism: Secondary | ICD-10-CM | POA: Diagnosis not present

## 2014-09-17 DIAGNOSIS — R2681 Unsteadiness on feet: Secondary | ICD-10-CM | POA: Diagnosis not present

## 2014-09-17 DIAGNOSIS — R278 Other lack of coordination: Secondary | ICD-10-CM | POA: Diagnosis not present

## 2014-09-17 LAB — CBC
HCT: 27.2 % — ABNORMAL LOW (ref 36.0–46.0)
Hemoglobin: 9.4 g/dL — ABNORMAL LOW (ref 12.0–15.0)
MCH: 30.2 pg (ref 26.0–34.0)
MCHC: 34.6 g/dL (ref 30.0–36.0)
MCV: 87.5 fL (ref 78.0–100.0)
PLATELETS: 221 10*3/uL (ref 150–400)
RBC: 3.11 MIL/uL — ABNORMAL LOW (ref 3.87–5.11)
RDW: 13.1 % (ref 11.5–15.5)
WBC: 7.8 10*3/uL (ref 4.0–10.5)

## 2014-09-17 LAB — BASIC METABOLIC PANEL
Anion gap: 8 (ref 5–15)
BUN: 6 mg/dL (ref 6–20)
CALCIUM: 8.6 mg/dL — AB (ref 8.9–10.3)
CO2: 25 mmol/L (ref 22–32)
Chloride: 102 mmol/L (ref 101–111)
Creatinine, Ser: 0.5 mg/dL (ref 0.44–1.00)
GFR calc Af Amer: >60 mL/min (ref >60)
GFR calc non Af Amer: >60 mL/min (ref >60)
GLUCOSE: 119 mg/dL — AB (ref 70–99)
Potassium: 3.5 mmol/L (ref 3.5–5.1)
SODIUM: 135 mmol/L (ref 135–145)

## 2014-09-17 MED ORDER — POLYETHYLENE GLYCOL 3350 17 G PO PACK
17.0000 g | PACK | Freq: Every day | ORAL | Status: DC
  Administered 2014-09-17: 17 g via ORAL
  Filled 2014-09-17: qty 1

## 2014-09-17 MED ORDER — FLEET ENEMA 7-19 GM/118ML RE ENEM: 1.0000 | ENEMA | Freq: Every day | RECTAL | Status: DC | PRN

## 2014-09-17 NOTE — Progress Notes (Signed)
Patient ID: Kimberly [name], female   DOB: 06/18/1950, 64 y.o.   MRN: [id number] PATIENT ID: Kimberly [name]        MRN:  [id number]          DOB/AGE: 1950-11-13 / 64 y.o.    Kimberly Fears, MD   Biagio Borg, PA-C [address] [place], [place]  [address]                             [phone]   PROGRESS NOTE  Subjective:  negative for Chest Pain  negative for Shortness of Breath  negative for Nausea/Vomiting   negative for Calf Pain    Tolerating Diet: yes         Patient reports pain as mild and moderate.     W[facility] to [facility] to [facility].  Objective: Vital signs in last 24 hours:   Patient Vitals for the past 24 hrs:  BP Temp Pulse Resp SpO2  09/17/14 0537 (!) 140/56 mmHg 98 F (36.7 C) 79 18 96 %  09/16/14 2126 140/60 mmHg 99.7 F (37.6 C) 81 18 98 %  09/16/14 1400 140/60 mmHg 98.6 F (37 C) 80 18 98 %      Intake/Output from previous day:   05/05 0701 - 05/06 0700 In: 840 [P.O.:840] Out: -    Intake/Output this shift:       Intake/Output      05/05 0701 - 05/06 0700 05/06 0701 - 05/07 0700   P.O. 840    Total Intake(mL/kg) 840 (11.4)    Urine (mL/kg/hr)     Total Output       Net +840          Urine Occurrence 5 x       LABORATORY DATA:  Recent Labs  09/15/14 0605 09/16/14 0415 09/17/14 0453  WBC 7.7 7.8 7.8  HGB 9.8* 9.5* 9.4*  HCT 29.4* 28.2* 27.2*  PLT 231 211 221    Recent Labs  09/15/14 0605 09/16/14 0415 09/17/14 0453  NA 132* 137 135  K 4.0 3.7 3.5  CL 100* 106 102  CO2 24 25 25   BUN 6 5* 6  CREATININE 0.54 0.57 0.50  GLUCOSE 130* 155* 119*  CALCIUM 8.9 8.6* 8.6*   Lab Results  Component Value Date   INR 1.06 09/02/2014   INR 1.01 10/07/2012    Recent Radiographic Studies :  Dg Chest 2 View  09/02/2014   CLINICAL DATA:  Preoperative exam prior to total knee replacement, nonsmoker, history of hypertension.  EXAM: CHEST  2 VIEW  COMPARISON:  PA and lateral chest x-ray of August 03, 2013  FINDINGS: The lungs are  adequately inflated. There is no focal infiltrate. There is no pleural effusion. The heart and pulmonary vascularity are normal. The mediastinum is normal in width. The bony thorax is unremarkable.  IMPRESSION: There is no active cardiopulmonary disease.   Electronically Signed   By: David  Martinique M.D.   On: 09/02/2014 09:30     Examination:  General appearance: alert, cooperative and mild distress  Wound Exam: clean, dry, intact dressing  Drainage:  None: wound tissue dry  Motor Exam: EHL, FHL, Anterior Tibial and Posterior Tibial Intact  Sensory Exam: Superficial Peroneal, Deep Peroneal and Tibial normal  Vascular Exam: Right posterior tibial artery has 1+ (weak) pulse  Assessment:    3 Days Post-Op  Procedure(s) (LRB): RIGHT TOTAL KNEE ARTHROPLASTY (Right)  ADDITIONAL DIAGNOSIS:  Principal Problem:   Primary osteoarthritis of right knee Active Problems:   Primary osteoarthritis of knee    Plan: Physical Therapy as ordered Partial Weight Bearing @ 50% (PWB)  DVT Prophylaxis:  Xarelto and TED hose  DISCHARGE PLAN: Skilled Nursing Facility/Rehab today to [place]: HHPT, CPM, Walker and 3-in-1 comode seat        [name]  09/17/2014 8:04 AM

## 2014-09-17 NOTE — Discharge Planning (Signed)
Patient will discharge today per MD order. Patient will discharge to: [facility] (paperwork already signed) RN to call report prior to transportation to [phone] Transportation: [facility] (patient husband request dc prior to 10am- [facility] scheduled for 9am)  CSW sent discharge summary to SNF for review.  Packet is complete.  RN, patient and family aware of discharge plans.  Nonnie Done, [place] [phone]  Psychiatric & Orthopedics (5N 1-16) Clinical Social Worker

## 2014-09-20 ENCOUNTER — Encounter: Payer: Self-pay | Admitting: Adult Health

## 2014-09-20 ENCOUNTER — Non-Acute Institutional Stay (SKILLED_NURSING_FACILITY): Payer: Medicare Other | Admitting: Adult Health

## 2014-09-20 DIAGNOSIS — D62 Acute posthemorrhagic anemia: Secondary | ICD-10-CM | POA: Diagnosis not present

## 2014-09-20 DIAGNOSIS — M1711 Unilateral primary osteoarthritis, right knee: Secondary | ICD-10-CM

## 2014-09-20 DIAGNOSIS — I10 Essential (primary) hypertension: Secondary | ICD-10-CM | POA: Diagnosis not present

## 2014-09-20 DIAGNOSIS — E039 Hypothyroidism, unspecified: Secondary | ICD-10-CM

## 2014-09-20 DIAGNOSIS — M8580 Other specified disorders of bone density and structure, unspecified site: Secondary | ICD-10-CM | POA: Diagnosis not present

## 2014-09-20 NOTE — Progress Notes (Signed)
Patient ID: Kimberly [name], female   DOB: 1951-05-02, 64 y.o.   MRN: [id number]   09/20/2014  Facility:  Nursing Home Location:  [place] Room Number: 778-2 LEVEL OF CARE:  SNF (31)   Chief Complaint  Patient presents with  . Hospitalization Follow-up    Osteoarthritis S/P right total knee arthroplasty, hypertension, hypothyroidism, anemia and osteopenia    HISTORY OF PRESENT ILLNESS:  This is a 64 year old female who was been admitted to [facility] on 09/17/14 from [facility] with osteoarthritis S/P right total knee arthroplasty. She has PMH of hypertension, hypothyroidism, DVT 2003 Coumadin 6 months, GERD and osteopenia. She has been admitted for a short-term rehabilitation.  PAST MEDICAL HISTORY:  Past Medical History  Diagnosis Date  . Anemia 2005    hb 11.5  . Hypertension   . Hypothyroidism     off meds  . DVT (deep venous thrombosis)     2003, coumadin x 6 mon  . GERD (gastroesophageal reflux disease) 2001    no problem now  . Arthritis   . Osteopenia   . Vegetarian diet     eats fish and eggs; no meat    CURRENT MEDICATIONS: Reviewed per MAR/see medication list  Allergies  Allergen Reactions  . Enoxaparin Sodium     REACTION: itching,swelling     REVIEW OF SYSTEMS:  GENERAL: no change in appetite, no fatigue, no weight changes, no fever, chills or weakness RESPIRATORY: no cough, SOB, DOE, wheezing, hemoptysis CARDIAC: no chest pain, edema or palpitations GI: no abdominal pain, diarrhea, constipation, heart burn, nausea or vomiting  PHYSICAL EXAMINATION  GENERAL: no acute distress, normal body habitus SKIN:  Right knee surgical incision is covered with aquacel dressing, no redness, dry EYES: conjunctivae normal, sclerae normal, normal eye lids NECK: supple, trachea midline, no neck masses, no thyroid tenderness, no thyromegaly LYMPHATICS: no LAN in the neck, no supraclavicular LAN RESPIRATORY: breathing is even &  unlabored, BS CTAB CARDIAC: RRR, no murmur,no extra heart sounds, no edema GI: abdomen soft, normal BS, no masses, no tenderness, no hepatomegaly, no splenomegaly EXTREMITIES: Able to move 4 extremities PSYCHIATRIC: the patient is alert & oriented to person, affect & behavior appropriate  LABS/RADIOLOGY: Labs reviewed: Basic Metabolic Panel:  Recent Labs  09/15/14 0605 09/16/14 0415 09/17/14 0453  NA 132* 137 135  K 4.0 3.7 3.5  CL 100* 106 102  CO2 24 25 25   GLUCOSE 130* 155* 119*  BUN 6 5* 6  CREATININE 0.54 0.57 0.50  CALCIUM 8.9 8.6* 8.6*   Liver Function Tests:  Recent Labs  12/08/13 1113 09/02/14 0922  AST 25 23  ALT 26 22  ALKPHOS 64 56  BILITOT 1.0 0.9  PROT 7.6 7.6  ALBUMIN 3.8 3.8    Recent Labs  09/02/14 0922 09/15/14 0605 09/16/14 0415 09/17/14 0453  WBC 5.3 7.7 7.8 7.8  NEUTROABS 2.7  --   --   --   HGB 12.6 9.8* 9.5* 9.4*  HCT 37.5 29.4* 28.2* 27.2*  MCV 88.4 88.0 87.9 87.5  PLT 267 231 211 221   Lipid Panel:  Recent Labs  12/08/13 1113  HDL 47.20    Dg Chest 2 View  09/02/2014   CLINICAL DATA:  Preoperative exam prior to total knee replacement, nonsmoker, history of hypertension.  EXAM: CHEST  2 VIEW  COMPARISON:  PA and lateral chest x-ray of August 03, 2013  FINDINGS: The lungs are adequately inflated. There is no focal infiltrate.  There is no pleural effusion. The heart and pulmonary vascularity are normal. The mediastinum is normal in width. The bony thorax is unremarkable.  IMPRESSION: There is no active cardiopulmonary disease.   Electronically Signed   By: David  Martinique M.D.   On: 09/02/2014 09:30    ASSESSMENT/PLAN:  Osteoarthritis S/P right total knee arthroplasty - for rehabilitation; follow-up with Dr. Durward Fortes, orthosurgeon on 09/27/14; continue oxycodone 5 mg 1-2 tabs by mouth every 4 hours when necessary for pain and several toe 10 mg 1 tab by mouth daily for DVT prophylaxis Hypertension - well-controlled; continue  Irbesartan 150 mg PO Q D Hypothyroidism - tsh 1.81; continue Synthroid 50 g by mouth daily Anemia, acute blood loss - hemoglobin 9.4; will monitor  Osteopenia - continue Os-Cal 600 mg by mouth twice a day and vitamin D 1000 units by mouth daily   Goals of care:  Short-term rehabilitation  Labs/test ordered:  CBC, BMP   Spent 50 minutes in patient care.    [facility], NP [employer] [phone]

## 2014-09-21 ENCOUNTER — Non-Acute Institutional Stay (SKILLED_NURSING_FACILITY): Payer: Medicare Other | Admitting: Internal Medicine

## 2014-09-21 DIAGNOSIS — E559 Vitamin D deficiency, unspecified: Secondary | ICD-10-CM | POA: Diagnosis not present

## 2014-09-21 DIAGNOSIS — D62 Acute posthemorrhagic anemia: Secondary | ICD-10-CM

## 2014-09-21 DIAGNOSIS — M1711 Unilateral primary osteoarthritis, right knee: Secondary | ICD-10-CM | POA: Diagnosis not present

## 2014-09-21 DIAGNOSIS — M25461 Effusion, right knee: Secondary | ICD-10-CM | POA: Diagnosis not present

## 2014-09-21 DIAGNOSIS — E039 Hypothyroidism, unspecified: Secondary | ICD-10-CM

## 2014-09-21 DIAGNOSIS — I10 Essential (primary) hypertension: Secondary | ICD-10-CM | POA: Diagnosis not present

## 2014-09-21 NOTE — Progress Notes (Signed)
Patient ID: Kimberly [name], female   DOB: 1950/09/14, 64 y.o.   MRN: [id number]     [facility]   PCP: Eulas Post, MD  Code Status: full code  Allergies  Allergen Reactions  . Enoxaparin Sodium     REACTION: itching,swelling    Chief Complaint  Patient presents with  . New Admit To SNF     HPI:  64 year old patient is here for short term rehabilitation post hospital admission from 09/14/14-09/17/14 with right knee OA. She underwent right total knee arthroplasty. Her pain is under control with current regimen. She has noticed increased swelling with some redness on her right knee lateral side. Denies increased pain. She is concerned about infection. Denies any fever or chills. No visible drainage soaking/ staining the dressing as per patient. She mentions wearing knee high tedhose until yesterday which was being tight at her knee and popliteal area. She now has thigh high ted hose. No other concerns. She was started on robaxin on 09/18/14 and tolerating it well She has PMH of gerd, HTN, hypothyroidism, OA  Review of Systems:  Constitutional: Negative for fever, chills, diaphoresis.  HENT: Negative for headache, congestion, nasal discharge Eyes: Negative for eye pain, blurred vision, double vision and discharge.  Respiratory: Negative for cough, shortness of breath and wheezing.   Cardiovascular: Negative for chest pain, palpitations, leg swelling.  Gastrointestinal: Negative for heartburn, nausea, vomiting, abdominal pain. Had a bowel movement today Genitourinary: Negative for dysuria Musculoskeletal: Negative for back pain, falls Skin: Negative for itching, rash.  Neurological: Negative for dizziness, tingling, focal weakness Psychiatric/Behavioral: Negative for depression  Past Medical History  Diagnosis Date  . Anemia 2005    hb 11.5  . Hypertension   . Hypothyroidism     off meds  . DVT (deep venous thrombosis)     2003, coumadin x 6  mon  . GERD (gastroesophageal reflux disease) 2001    no problem now  . Arthritis   . Osteopenia   . Vegetarian diet     eats fish and eggs; no meat   Past Surgical History  Procedure Laterality Date  . Oophorectomy unilateral    . Arthroscopy right knee  03/16/03  . Total knee arthroplasty Right 09/14/2014    Procedure: RIGHT TOTAL KNEE ARTHROPLASTY;  Surgeon: Garald Balding, MD;  Location: [facility];  Service: Orthopedics;  Laterality: Right;   Social History:   reports that she has never smoked. She has never used smokeless tobacco. She reports that she does not drink alcohol or use illicit drugs.  Family History  Problem Relation Age of Onset  . Diabetes Other   . Alcohol abuse Other   . Diabetes Mother   . Heart failure Father     Medications: Patient's Medications  New Prescriptions   No medications on file  Previous Medications   ALBUTEROL (PROVENTIL HFA;VENTOLIN HFA) 108 (90 BASE) MCG/ACT INHALER    Inhale 2 puffs into the lungs every 6 (six) hours as needed for wheezing or shortness of breath.   CALCIUM CARBONATE (OS-CAL) 600 MG TABS    Take 600 mg by mouth 2 (two) times daily with a meal.   CHOLECALCIFEROL (VITAMIN D) 1000 UNITS TABLET    Take 1,000 Units by mouth daily.     IRBESARTAN (AVAPRO) 150 MG TABLET    Take 1 tablet (150 mg total) by mouth daily.   LEVOTHYROXINE (SYNTHROID, LEVOTHROID) 50 MCG TABLET    TAKE ONE TABLET BY  MOUTH ONCE DAILY   OXYCODONE (OXY IR/ROXICODONE) 5 MG IMMEDIATE RELEASE TABLET    Take 1-2 tablets (5-10 mg total) by mouth every 4 (four) hours as needed for breakthrough pain.   RIVAROXABAN (XARELTO) 10 MG TABS TABLET    Take 1 tablet (10 mg total) by mouth daily with breakfast.  Modified Medications   No medications on file  Discontinued Medications   No medications on file     Physical Exam: Filed Vitals:   09/21/14 1727  BP: 140/65  Pulse: 72  Temp: 97.8 F (36.6 C)  Resp: 18  Height: 5\' 1"  (1.549 m)  Weight: 165 lb 3.2 oz  (74.934 kg)  SpO2: 97%   Body mass index is 31.23 kg/(m^2).   General- elderly female, obese , in no acute distress Head- normocephalic, atraumatic Throat- moist mucus membrane Eyes- PERRLA, EOMI, no pallor, no icterus, no discharge, normal conjunctiva, normal sclera Neck- no cervical lymphadenopathy Cardiovascular- normal s1,s2, no murmurs, palpable dorsalis pedis, right 1+ leg edema Respiratory- bilateral clear to auscultation, no wheeze, no rhonchi, no crackles, no use of accessory muscles Abdomen- bowel sounds present, soft, non tender Musculoskeletal- able to move all 4 extremities, limited right knee range of motion, ted hose in place, erythema on lateral right knee area with some warmth, aquacel dressing on right knee with no stain/ drainage noted on/ through the dressing  Neurological- no focal deficit Skin- warm and dry Psychiatry- alert and oriented to person, place and time, normal mood and affect    Labs reviewed: Basic Metabolic Panel:  Recent Labs  09/15/14 0605 09/16/14 0415 09/17/14 0453  NA 132* 137 135  K 4.0 3.7 3.5  CL 100* 106 102  CO2 24 25 25   GLUCOSE 130* 155* 119*  BUN 6 5* 6  CREATININE 0.54 0.57 0.50  CALCIUM 8.9 8.6* 8.6*   Liver Function Tests:  Recent Labs  12/08/13 1113 09/02/14 0922  AST 25 23  ALT 26 22  ALKPHOS 64 56  BILITOT 1.0 0.9  PROT 7.6 7.6  ALBUMIN 3.8 3.8   No results for input(s): LIPASE, AMYLASE in the last 8760 hours. No results for input(s): AMMONIA in the last 8760 hours. CBC:  Recent Labs  09/02/14 0922 09/15/14 0605 09/16/14 0415 09/17/14 0453  WBC 5.3 7.7 7.8 7.8  NEUTROABS 2.7  --   --   --   HGB 12.6 9.8* 9.5* 9.4*  HCT 37.5 29.4* 28.2* 27.2*  MCV 88.4 88.0 87.9 87.5  PLT 267 231 211 221    Assessment/Plan  Right knee Osteoarthritis  S/P right total knee arthroplasty. Will have her work with physical therapy and occupational therapy team to help with gait training and muscle strengthening  exercises.fall precautions. Skin care. Encourage to be out of bed. Has f/u with orthopedics 09/27/14. Continue oxycodone 5 mg 1-2 tabs q4h prn pain and robaxin 500 mg q6h prn muscle spasm. Continue xarelto for dvt prophylaxis. Continue ted hose and cpm machine. Continue oscal and vitamin d   Right knee swelling With some erythema on lateral side, non fluctuant on exam, no skin breakdown or drainage, dressing in clean and dry. likley post op with some dependence related stasis. Good distal pulses and popliteal pulse. Check cbc with diff and if has elevated wbc, will remove her aquacel dresssing and assess incision further. Monitor clinically for now  Blood loss anemia Post op, monitor h&h  Hypertension continue Irbesartan 150 mg daily  Hypothyroidism continue Synthroid 50 mcg daily  Vitamin d def  Continue vitamin d supplement   Goals of care: short term rehabilitation   Labs/tests ordered: cbc with diff  Family/ staff Communication: reviewed care plan with patient and nursing supervisor    Blanchie Serve, MD  [facility] [phone] (Monday-Friday 8 am - 5 pm) [phone] (afterhours)

## 2014-09-27 DIAGNOSIS — M1711 Unilateral primary osteoarthritis, right knee: Secondary | ICD-10-CM | POA: Diagnosis not present

## 2014-09-29 ENCOUNTER — Encounter: Payer: Self-pay | Admitting: Adult Health

## 2014-09-29 ENCOUNTER — Non-Acute Institutional Stay (SKILLED_NURSING_FACILITY): Payer: Medicare Other | Admitting: Adult Health

## 2014-09-29 DIAGNOSIS — I10 Essential (primary) hypertension: Secondary | ICD-10-CM

## 2014-09-29 DIAGNOSIS — E039 Hypothyroidism, unspecified: Secondary | ICD-10-CM

## 2014-09-29 DIAGNOSIS — M1711 Unilateral primary osteoarthritis, right knee: Secondary | ICD-10-CM

## 2014-09-29 DIAGNOSIS — M8580 Other specified disorders of bone density and structure, unspecified site: Secondary | ICD-10-CM | POA: Diagnosis not present

## 2014-09-29 DIAGNOSIS — D62 Acute posthemorrhagic anemia: Secondary | ICD-10-CM | POA: Diagnosis not present

## 2014-09-29 NOTE — Progress Notes (Signed)
Patient ID: Kimberly [name], female   DOB: 1950/08/29, 64 y.o.   MRN: [id number]   09/29/2014  Facility:  Nursing Home Location:  [place] Room Number: 829-9 LEVEL OF CARE:  SNF (31)   Chief Complaint  Patient presents with  . Discharge Note    Osteoarthritis S/P right total knee arthroplasty, hypertension, hypothyroidism, anemia and osteopenia    HISTORY OF PRESENT ILLNESS:  This is a 64 year old female who is for discharge home with home health PT for endurance and OT for ADLs. DME: Rolling walker, 3 in one bedside commode, rollator and shower bench. She has been admitted to [facility] on 09/17/14 from [facility] with osteoarthritis S/P right total knee arthroplasty. She has PMH of hypertension, hypothyroidism, DVT 2003 Coumadin 6 months, GERD and osteopenia.    Patient was admitted to this facility for short-term rehabilitation after the patient's recent hospitalization.  Patient has completed SNF rehabilitation and therapy has cleared the patient for discharge.  PAST MEDICAL HISTORY:  Past Medical History  Diagnosis Date  . Anemia 2005    hb 11.5  . Hypertension   . Hypothyroidism     off meds  . DVT (deep venous thrombosis)     2003, coumadin x 6 mon  . GERD (gastroesophageal reflux disease) 2001    no problem now  . Arthritis   . Osteopenia   . Vegetarian diet     eats fish and eggs; no meat    CURRENT MEDICATIONS: Reviewed per MAR/see medication list  Allergies  Allergen Reactions  . Enoxaparin Sodium     REACTION: itching,swelling     REVIEW OF SYSTEMS:  GENERAL: no change in appetite, no fatigue, no weight changes, no fever, chills or weakness RESPIRATORY: no cough, SOB, DOE, wheezing, hemoptysis CARDIAC: no chest pain, edema or palpitations GI: no abdominal pain, diarrhea, constipation, heart burn, nausea or vomiting  PHYSICAL EXAMINATION  GENERAL: no acute distress, normal body habitus SKIN:  Right knee surgical  incision has steri-strips, no redness, dry NECK: supple, trachea midline, no neck masses, no thyroid tenderness, no thyromegaly LYMPHATICS: no LAN in the neck, no supraclavicular LAN RESPIRATORY: breathing is even & unlabored, BS CTAB CARDIAC: RRR, no murmur,no extra heart sounds, no edema GI: abdomen soft, normal BS, no masses, no tenderness, no hepatomegaly, no splenomegaly EXTREMITIES: Able to move 4 extremities PSYCHIATRIC: the patient is alert & oriented to person, affect & behavior appropriate  LABS/RADIOLOGY: Labs reviewed: 09/21/14  WBC 6.8 hemoglobin 10.4 hematocrit 30.7 MCV 87.2 platelet 390 sodium 135 potassium 4.1 glucose 97 BUN 8 creatinine 0.58 calcium 9.2 Basic Metabolic Panel:  Recent Labs  09/15/14 0605 09/16/14 0415 09/17/14 0453  NA 132* 137 135  K 4.0 3.7 3.5  CL 100* 106 102  CO2 24 25 25   GLUCOSE 130* 155* 119*  BUN 6 5* 6  CREATININE 0.54 0.57 0.50  CALCIUM 8.9 8.6* 8.6*   Liver Function Tests:  Recent Labs  12/08/13 1113 09/02/14 0922  AST 25 23  ALT 26 22  ALKPHOS 64 56  BILITOT 1.0 0.9  PROT 7.6 7.6  ALBUMIN 3.8 3.8    Recent Labs  09/02/14 0922 09/15/14 0605 09/16/14 0415 09/17/14 0453  WBC 5.3 7.7 7.8 7.8  NEUTROABS 2.7  --   --   --   HGB 12.6 9.8* 9.5* 9.4*  HCT 37.5 29.4* 28.2* 27.2*  MCV 88.4 88.0 87.9 87.5  PLT 267 231 211 221   Lipid Panel:  Recent Labs  12/08/13 1113  HDL 47.20    Dg Chest 2 View  09/02/2014   CLINICAL DATA:  Preoperative exam prior to total knee replacement, nonsmoker, history of hypertension.  EXAM: CHEST  2 VIEW  COMPARISON:  PA and lateral chest x-ray of August 03, 2013  FINDINGS: The lungs are adequately inflated. There is no focal infiltrate. There is no pleural effusion. The heart and pulmonary vascularity are normal. The mediastinum is normal in width. The bony thorax is unremarkable.  IMPRESSION: There is no active cardiopulmonary disease.   Electronically Signed   By: David  Martinique M.D.   On:  09/02/2014 09:30    ASSESSMENT/PLAN:  Osteoarthritis S/P right total knee arthroplasty - for home health PT and OT; continue oxycodone 5 mg 1-2 tabs by mouth every 4 hours when necessary for pain  Hypertension - well-controlled; continue Irbesartan 150 mg PO Q D Hypothyroidism - tsh 1.81; continue Synthroid 50 g by mouth daily Anemia, acute blood loss - hemoglobin 10.4; stable Osteopenia - continue Os-Cal 600 mg by mouth twice a day and vitamin D 1000 units by mouth daily    I have filled out patient's discharge paperwork and written prescriptions.  Patient will receive home health PT and OT.  DME provided:  Rolling walker, 3 in one bedside commode, rollator and shower bench  Total discharge time: Greater than 30 minutes  Discharge time involved coordination of the discharge process with Education officer, museum, nursing staff and therapy department. Medical justification for home health services/DME verified.    [facility], NP [employer] [phone]

## 2014-09-30 DIAGNOSIS — M1712 Unilateral primary osteoarthritis, left knee: Secondary | ICD-10-CM | POA: Diagnosis not present

## 2014-09-30 DIAGNOSIS — I10 Essential (primary) hypertension: Secondary | ICD-10-CM | POA: Diagnosis not present

## 2014-09-30 DIAGNOSIS — M6281 Muscle weakness (generalized): Secondary | ICD-10-CM | POA: Diagnosis not present

## 2014-09-30 DIAGNOSIS — Z471 Aftercare following joint replacement surgery: Secondary | ICD-10-CM | POA: Diagnosis not present

## 2014-09-30 DIAGNOSIS — Z96651 Presence of right artificial knee joint: Secondary | ICD-10-CM | POA: Diagnosis not present

## 2014-09-30 DIAGNOSIS — Z86718 Personal history of other venous thrombosis and embolism: Secondary | ICD-10-CM | POA: Diagnosis not present

## 2014-10-01 DIAGNOSIS — Z86718 Personal history of other venous thrombosis and embolism: Secondary | ICD-10-CM | POA: Diagnosis not present

## 2014-10-01 DIAGNOSIS — I10 Essential (primary) hypertension: Secondary | ICD-10-CM | POA: Diagnosis not present

## 2014-10-01 DIAGNOSIS — M6281 Muscle weakness (generalized): Secondary | ICD-10-CM | POA: Diagnosis not present

## 2014-10-01 DIAGNOSIS — Z471 Aftercare following joint replacement surgery: Secondary | ICD-10-CM | POA: Diagnosis not present

## 2014-10-01 DIAGNOSIS — Z96651 Presence of right artificial knee joint: Secondary | ICD-10-CM | POA: Diagnosis not present

## 2014-10-01 DIAGNOSIS — M1712 Unilateral primary osteoarthritis, left knee: Secondary | ICD-10-CM | POA: Diagnosis not present

## 2014-10-04 DIAGNOSIS — Z96651 Presence of right artificial knee joint: Secondary | ICD-10-CM | POA: Diagnosis not present

## 2014-10-04 DIAGNOSIS — M1712 Unilateral primary osteoarthritis, left knee: Secondary | ICD-10-CM | POA: Diagnosis not present

## 2014-10-04 DIAGNOSIS — Z86718 Personal history of other venous thrombosis and embolism: Secondary | ICD-10-CM | POA: Diagnosis not present

## 2014-10-04 DIAGNOSIS — I10 Essential (primary) hypertension: Secondary | ICD-10-CM | POA: Diagnosis not present

## 2014-10-04 DIAGNOSIS — Z471 Aftercare following joint replacement surgery: Secondary | ICD-10-CM | POA: Diagnosis not present

## 2014-10-04 DIAGNOSIS — M6281 Muscle weakness (generalized): Secondary | ICD-10-CM | POA: Diagnosis not present

## 2014-10-05 DIAGNOSIS — M1712 Unilateral primary osteoarthritis, left knee: Secondary | ICD-10-CM | POA: Diagnosis not present

## 2014-10-05 DIAGNOSIS — Z471 Aftercare following joint replacement surgery: Secondary | ICD-10-CM | POA: Diagnosis not present

## 2014-10-05 DIAGNOSIS — M6281 Muscle weakness (generalized): Secondary | ICD-10-CM | POA: Diagnosis not present

## 2014-10-05 DIAGNOSIS — Z86718 Personal history of other venous thrombosis and embolism: Secondary | ICD-10-CM | POA: Diagnosis not present

## 2014-10-05 DIAGNOSIS — Z96651 Presence of right artificial knee joint: Secondary | ICD-10-CM | POA: Diagnosis not present

## 2014-10-05 DIAGNOSIS — I10 Essential (primary) hypertension: Secondary | ICD-10-CM | POA: Diagnosis not present

## 2014-10-06 DIAGNOSIS — Z471 Aftercare following joint replacement surgery: Secondary | ICD-10-CM | POA: Diagnosis not present

## 2014-10-06 DIAGNOSIS — I10 Essential (primary) hypertension: Secondary | ICD-10-CM | POA: Diagnosis not present

## 2014-10-06 DIAGNOSIS — M1712 Unilateral primary osteoarthritis, left knee: Secondary | ICD-10-CM | POA: Diagnosis not present

## 2014-10-06 DIAGNOSIS — Z86718 Personal history of other venous thrombosis and embolism: Secondary | ICD-10-CM | POA: Diagnosis not present

## 2014-10-06 DIAGNOSIS — M6281 Muscle weakness (generalized): Secondary | ICD-10-CM | POA: Diagnosis not present

## 2014-10-06 DIAGNOSIS — Z96651 Presence of right artificial knee joint: Secondary | ICD-10-CM | POA: Diagnosis not present

## 2014-10-07 DIAGNOSIS — Z471 Aftercare following joint replacement surgery: Secondary | ICD-10-CM | POA: Diagnosis not present

## 2014-10-07 DIAGNOSIS — M6281 Muscle weakness (generalized): Secondary | ICD-10-CM | POA: Diagnosis not present

## 2014-10-07 DIAGNOSIS — M1712 Unilateral primary osteoarthritis, left knee: Secondary | ICD-10-CM | POA: Diagnosis not present

## 2014-10-07 DIAGNOSIS — I10 Essential (primary) hypertension: Secondary | ICD-10-CM | POA: Diagnosis not present

## 2014-10-07 DIAGNOSIS — Z96651 Presence of right artificial knee joint: Secondary | ICD-10-CM | POA: Diagnosis not present

## 2014-10-07 DIAGNOSIS — Z86718 Personal history of other venous thrombosis and embolism: Secondary | ICD-10-CM | POA: Diagnosis not present

## 2014-10-07 NOTE — Addendum Note (Signed)
Addendum  created 10/07/14 1031 by Roberts Gaudy, MD   Modules edited: Anesthesia Blocks and Procedures, Clinical Notes   Clinical Notes:  File: [id number]

## 2014-10-08 DIAGNOSIS — Z96651 Presence of right artificial knee joint: Secondary | ICD-10-CM | POA: Diagnosis not present

## 2014-10-08 DIAGNOSIS — Z471 Aftercare following joint replacement surgery: Secondary | ICD-10-CM | POA: Diagnosis not present

## 2014-10-08 DIAGNOSIS — M6281 Muscle weakness (generalized): Secondary | ICD-10-CM | POA: Diagnosis not present

## 2014-10-08 DIAGNOSIS — M1712 Unilateral primary osteoarthritis, left knee: Secondary | ICD-10-CM | POA: Diagnosis not present

## 2014-10-08 DIAGNOSIS — Z86718 Personal history of other venous thrombosis and embolism: Secondary | ICD-10-CM | POA: Diagnosis not present

## 2014-10-08 DIAGNOSIS — I10 Essential (primary) hypertension: Secondary | ICD-10-CM | POA: Diagnosis not present

## 2014-10-12 ENCOUNTER — Ambulatory Visit: Payer: Medicare Other | Attending: Orthopaedic Surgery | Admitting: Physical Therapy

## 2014-10-12 DIAGNOSIS — M25661 Stiffness of right knee, not elsewhere classified: Secondary | ICD-10-CM | POA: Diagnosis not present

## 2014-10-12 DIAGNOSIS — R609 Edema, unspecified: Secondary | ICD-10-CM | POA: Diagnosis not present

## 2014-10-12 DIAGNOSIS — R531 Weakness: Secondary | ICD-10-CM | POA: Diagnosis not present

## 2014-10-12 DIAGNOSIS — R262 Difficulty in walking, not elsewhere classified: Secondary | ICD-10-CM | POA: Insufficient documentation

## 2014-10-12 NOTE — Patient Instructions (Signed)
Knee Extension Mobilization: Towel Prop   With rolled towel under right ankle, place _1__ pound weight across knee. Hold 1-5 minutes. Repeat ____ times per set. Do ____ sets per session. Do _2-3 ___ sessions per day.   KNEE: Knee Hang - Prone   Lie on stomach. Place towel above knee; hang feet off surface. Keep feet straight. Hold 60 seconds to 5 min. ___ reps per set, _2-3__ sets per day, ___ days per week Add ___ lb weights to ankles.   Iliotibial Band Stretch, Side-Lying   Lie on side, back to edge of bed, top arm in front. Allow top leg to drape behind over edge. Hold _30__ seconds.  Repeat _3__ times per session. Do _2__ sessions per day.   Quadriceps (Prone)   On stomach with sheet around ankles, knees together, hips down, pull heels toward bottom. Keep hips flat. Hold _30___ seconds. Repeat _3___ times. Do __2-3__ sessions per day. CAUTION: Stretch should be gentle, steady and slow.  Madelyn Flavors, PT 10/12/2014 10:53 AM [facility] [address], [place], [place] [address] Phone # [phone] Fax [phone]

## 2014-10-12 NOTE — Therapy (Signed)
[facility] [address], [place], [place], [address] Phone: [phone]   Fax:  [phone]  Physical Therapy Evaluation  Patient Details  Name: Kimberly [name] MRN: [id number] Date of Birth: [birth year] Referring Provider:  Garald Balding, MD  Encounter Date: 10/12/2014      PT End of Session - 10/12/14 1057    Visit Number 1   Number of Visits 10   Date for PT Re-Evaluation 12/10/14   PT Start Time 6546   PT Stop Time 1057   PT Time Calculation (min) 42 min   Activity Tolerance Patient tolerated treatment well   Behavior During Therapy [facility] for tasks assessed/performed      Past Medical History  Diagnosis Date  . Anemia 2005    hb 11.5  . Hypertension   . Hypothyroidism     off meds  . DVT (deep venous thrombosis)     2003, coumadin x 6 mon  . GERD (gastroesophageal reflux disease) 2001    no problem now  . Arthritis   . Osteopenia   . Vegetarian diet     eats fish and eggs; no meat    Past Surgical History  Procedure Laterality Date  . Oophorectomy unilateral    . Arthroscopy right knee  03/16/03  . Total knee arthroplasty Right 09/14/2014    Procedure: RIGHT TOTAL KNEE ARTHROPLASTY;  Surgeon: Garald Balding, MD;  Location: [place];  Service: Orthopedics;  Laterality: Right;    There were no vitals filed for this visit.  Visit Diagnosis:  Stiffness of knee joint, right - Plan: PT plan of care cert/re-cert  Generalized weakness - Plan: PT plan of care cert/re-cert  Edema - Plan: PT plan of care cert/re-cert  Difficulty walking - Plan: PT plan of care cert/re-cert      Subjective Assessment - 10/12/14 1017    Subjective Patient had a Rt TKR on 09/14/14. She now ambulates with SPC at home and in community. Patient was very active before surgery and would like to return to previous level. She has a bad left  knee and will have TKR probably in one year.   How long can you stand comfortably? 5 min  due to left knee paiin   How long can you walk comfortably? 10 min    Patient Stated Goals walk 2-3 miles, due staitionary bike, return to [employer]   Currently in Pain? Yes   Pain Score 2    Pain Location Knee   Pain Orientation Right   Pain Descriptors / Indicators Aching   Pain Type Surgical pain   Pain Radiating Towards right ITB near greater trochanter   Pain Onset 1 to 4 weeks ago   Pain Frequency Intermittent   Aggravating Factors  bending knee   Effect of Pain on Daily Activities limited with walking, exercising   Multiple Pain Sites Yes   Pain Score 1   Pain Location Knee   Pain Orientation Left   Pain Descriptors / Indicators Aching   Pain Type Chronic pain   Pain Onset More than a month ago   Pain Frequency Intermittent   Aggravating Factors  prolonged standing and at night   Pain Relieving Factors rest   Effect of Pain on Daily Activities limited walking, exercising            [facility] PT Assessment - 10/12/14 0001    Assessment   Medical Diagnosis s/p Rt TKR   Onset Date/Surgical Date 09/14/14  Next MD Visit 11/12/14   Prior Therapy HHPT until 10/08/14   Precautions   Precautions None   Restrictions   Weight Bearing Restrictions No   Balance Screen   Has the patient fallen in the past 6 months No   Has the patient had a decrease in activity level because of a fear of falling?  No   Is the patient reluctant to leave their home because of a fear of falling?  No   Home Ecologist residence   Living Arrangements Spouse/significant other   Type of [place] Two level   Alternate Level Stairs-Number of Steps 8 steps from garage to [place]; and 3 steps to front door railing on left hand side   Prior Function   Level of Independence Independent   Vocation Retired   Leisure walking   Observation/Other Assessments   Focus on Therapeutic Outcomes (FOTO)  61% limited   Observation/Other Assessments-Edema     Edema --  edema present right knee   Posture/Postural Control   Posture/Postural Control Postural limitations   Postural Limitations Rounded Shoulders;Forward head   Posture Comments bil genu varus   ROM / Strength   AROM / PROM / Strength AROM;PROM;Strength   AROM   AROM Assessment Site Knee   Right/Left Knee Right   Right Knee Extension -10  -7 passive   Right Knee Flexion 93  105 passive; left knee 120 deg   PROM   PROM Assessment Site Knee   Right/Left Knee --   Strength   Strength Assessment Site Hip;Knee   Right/Left Hip Right;Left   Right Hip Flexion 4-/5   Right Hip Extension 4-/5   Right Hip ABduction 4-/5   Left Hip Flexion 4+/5   Left Hip Extension 4-/5   Left Hip ABduction 4/5   Right/Left Knee Right;Left   Right Knee Flexion 4/5   Right Knee Extension 4-/5   Left Knee Flexion 5/5   Left Knee Extension 5/5   Right Knee   Right Knee Extension --   Right Knee Flexion --   Flexibility   Soft Tissue Assessment /Muscle Length yes   ITB Rt   Palpation   Patella mobility mild decrease inf/sup   Palpation comment mod tenderness Rt ITB   Ambulation/Gait   Ambulation/Gait Yes   Ambulation/Gait Assistance 6: Modified independent (Device/Increase time)   Ambulation Distance (Feet) 30 Feet   Assistive device Straight cane   Gait Pattern Step-through pattern;Decreased step length - right;Decreased step length - left;Decreased stance time - right;Decreased stance time - left   Ambulation Surface Level                   [facility] Adult PT Treatment/Exercise - 10/12/14 0001    Exercises   Exercises Knee/Hip   Knee/Hip Exercises: Stretches   Active Hamstring Stretch 1 rep;20 seconds   Quad Stretch 1 rep;30 seconds  prone   ITB Stretch 1 rep;30 seconds  sideliying   Knee/Hip Exercises: Aerobic   Stationary Bike demonstrated partial revolutions as pt has bike at home  good return by pt   Knee/Hip Exercises: Supine   Quad Sets 1 set;5 reps   Heel Slides  Right;1 set;5 reps   Straight Leg Raises 1 set;5 reps   Knee/Hip Exercises: Prone   Hamstring Curl 1 set;5 reps   Prone Knee Hang --  demo'd for HEP  PT Education - 10-17-14 1102    Education provided Yes   Education Details HEP   Person(s) Educated Patient   Methods Explanation;Demonstration;Handout   Comprehension Verbalized understanding;Returned demonstration          PT Short Term Goals - 10-17-14 1409    PT SHORT TERM GOAL #1   Title I with initial HEP   Time 4   Period Weeks   Status New   PT SHORT TERM GOAL #2   Title improved Rt knee ROM -5 to 110   Time 4   Period Weeks   Status New   PT SHORT TERM GOAL #3   Title able to amb without AD safely   Time 4   Period Weeks   Status New           PT Long Term Goals - 10/17/2014 1410    PT LONG TERM GOAL #1   Title I with advanced HEP   Time 8   Period Weeks   Status New   PT LONG TERM GOAL #2   Title improved Rt knee ROM -2 to 118 degrees   Time 8   Period Weeks   Status New   PT LONG TERM GOAL #3   Title able to walk for 30 minutes without increased Rt knee pain   Time 8   Period Weeks   Status New   PT LONG TERM GOAL #4   Title able to climb stairs with a reciprocal gait   Time 8   Period Weeks   Status New   PT LONG TERM GOAL #5   Title demo 4+/5 BLE strength to improve function   Time 8   Period Weeks   Status New   Additional Long Term Goals   Additional Long Term Goals Yes   PT LONG TERM GOAL #6   Title improve FOTO to 46% limited   Time 8   Period Weeks   Status New               Plan - Oct 17, 2014 1103    Clinical Impression Statement 64 year old femaile s/p Rt TKR on 09/14/14. Patient plans to have left TKR next year. She has decreased Rt knee ROM and decreased strength in BLE. She presently amb with a SPC but is limited with standing and walking endurance. She would like to return to walking 2-3 miles per day, return to working out at [employer] and to using  her stationary bike.  She lives in a two level home iwith 8 steps to get from the garage to the kitchen. She also has edema and pain with bending her knee. These deficits are affecting ADLs including sleep.   Pt will benefit from skilled therapeutic intervention in order to improve on the following deficits Abnormal gait;Decreased range of motion;Difficulty walking;Decreased activity tolerance;Pain;Decreased mobility;Decreased strength;Increased edema   Rehab Potential Excellent   PT Frequency 3x / week  2-3x/week   PT Duration 8 weeks   PT Treatment/Interventions ADLs/Self Care Home Management;Cryotherapy;Electrical Stimulation;Ultrasound;Moist Heat;Stair training;Gait training;Therapeutic exercise;Balance training;Neuromuscular re-education;Patient/family education;Manual techniques;Scar mobilization;Passive range of motion;Taping;Vasopneumatic Device          G-Codes - 17-Oct-2014 1414    Functional Assessment Tool Used FOTO   Functional Limitation Mobility: Walking and moving around   Mobility: Walking and Moving Around Current Status (I4580) At least 60 percent but less than 80 percent impaired, limited or restricted   Mobility: Walking and Moving Around Goal Status (D9833) At least 40 percent  but less than 60 percent impaired, limited or restricted       Problem List Patient Active Problem List   Diagnosis Date Noted  . Acute blood loss anemia 09/20/2014  . Primary osteoarthritis of right knee 09/14/2014  . Primary osteoarthritis of knee 09/14/2014  . Vitamin B 12 deficiency 07/29/2014  . Obesity (BMI 30-39.9) 12/08/2013  . Osteoarthritis, knee 05/30/2012  . Metabolic syndrome 64/33/2951  . DVT, HX OF 02/10/2010  . Vitamin D deficiency 08/25/2007  . Disorder of bone and cartilage 04/08/2007  . Hypothyroidism 11/18/2006  . ANEMIA-NOS 11/18/2006  . Essential hypertension 11/18/2006  . GERD 11/18/2006    Madelyn Flavors PT  10/12/2014, 2:18 PM  [facility] [address] [place], [place], [address] Phone: [phone]   Fax:  [phone]

## 2014-10-13 ENCOUNTER — Ambulatory Visit: Payer: Medicare Other | Attending: Orthopaedic Surgery | Admitting: Physical Therapy

## 2014-10-13 DIAGNOSIS — R262 Difficulty in walking, not elsewhere classified: Secondary | ICD-10-CM | POA: Diagnosis not present

## 2014-10-13 DIAGNOSIS — R609 Edema, unspecified: Secondary | ICD-10-CM

## 2014-10-13 DIAGNOSIS — R531 Weakness: Secondary | ICD-10-CM | POA: Diagnosis not present

## 2014-10-13 DIAGNOSIS — M25661 Stiffness of right knee, not elsewhere classified: Secondary | ICD-10-CM | POA: Diagnosis not present

## 2014-10-13 NOTE — Therapy (Signed)
[facility] [address] [place], [place], [address] Phone: [phone]   Fax:  [phone]  Physical Therapy Treatment  Patient Details  Name: Kimberly [name] MRN: [id number] Date of Birth: 12/17/1950 Referring Provider:  Eulas Post, MD  Encounter Date: 10/13/2014      PT End of Session - 10/13/14 0803    Visit Number 2   Number of Visits 10   Date for PT Re-Evaluation 12/10/14   PT Start Time 0800   PT Stop Time 0904   PT Time Calculation (min) 64 min   Activity Tolerance Patient tolerated treatment well      Past Medical History  Diagnosis Date  . Anemia 2005    hb 11.5  . Hypertension   . Hypothyroidism     off meds  . DVT (deep venous thrombosis)     2003, coumadin x 6 mon  . GERD (gastroesophageal reflux disease) 2001    no problem now  . Arthritis   . Osteopenia   . Vegetarian diet     eats fish and eggs; no meat    Past Surgical History  Procedure Laterality Date  . Oophorectomy unilateral    . Arthroscopy right knee  03/16/03  . Total knee arthroplasty Right 09/14/2014    Procedure: RIGHT TOTAL KNEE ARTHROPLASTY;  Surgeon: Garald Balding, MD;  Location: [place];  Service: Orthopedics;  Laterality: Right;    There were no vitals filed for this visit.  Visit Diagnosis:  Stiffness of knee joint, right  Generalized weakness  Edema      Subjective Assessment - 10/13/14 0803    Subjective no new complaints   Currently in Pain? Yes   Pain Score 1    Pain Location Knee   Pain Orientation Right   Pain Descriptors / Indicators Aching            [facility] PT Assessment - 10/13/14 0001    AROM   Right Knee Flexion 98   PROM   Right/Left Knee Right  107 and 110 after mobs and MFR                     [facility] Adult PT Treatment/Exercise - 10/13/14 0001    Knee/Hip Exercises: Aerobic   Stationary Bike partial revolutions x 6 min   Knee/Hip Exercises: Standing   Terminal Knee Extension Right;2 sets;10 reps  red tband   Lateral Step Up 2 sets;10 reps;Hand Hold: 1  4 inch   Forward Step Up 2 sets;10 reps;Hand Hold: 2  6 inch   Other Standing Knee Exercises reverse step up 2 inch x 10   Modalities   Modalities Cryotherapy   Cryotherapy   Number Minutes Cryotherapy 15 Minutes   Cryotherapy Location Knee   Type of Cryotherapy Other (comment)  vaso med pressure 3*   Manual Therapy   Manual Therapy Joint mobilization;Myofascial release;Passive ROM   Joint Mobilization grade III/IV knee flex/ext mobs   Myofascial Release Rt ITB in sidelying   Passive ROM Rt knee flex/ext                PT Education - 10/12/14 1102    Education provided Yes   Education Details HEP   Person(s) Educated Patient   Methods Explanation;Demonstration;Handout   Comprehension Verbalized understanding;Returned demonstration          PT Short Term Goals - 10/12/14 1409    PT SHORT TERM GOAL #1   Title  I with initial HEP   Time 4   Period Weeks   Status New   PT SHORT TERM GOAL #2   Title improved Rt knee ROM -5 to 110   Time 4   Period Weeks   Status New   PT SHORT TERM GOAL #3   Title able to amb without AD safely   Time 4   Period Weeks   Status New           PT Long Term Goals - 10-21-2014 1410    PT LONG TERM GOAL #1   Title I with advanced HEP   Time 8   Period Weeks   Status New   PT LONG TERM GOAL #2   Title improved Rt knee ROM -2 to 118 degrees   Time 8   Period Weeks   Status New   PT LONG TERM GOAL #3   Title able to walk for 30 minutes without increased Rt knee pain   Time 8   Period Weeks   Status New   PT LONG TERM GOAL #4   Title able to climb stairs with a reciprocal gait   Time 8   Period Weeks   Status New   PT LONG TERM GOAL #5   Title demo 4+/5 BLE strength to improve function   Time 8   Period Weeks   Status New   Additional Long Term Goals   Additional Long Term Goals Yes   PT LONG TERM GOAL #6    Title improve FOTO to 46% limited   Time 8   Period Weeks   Status New               Plan - 10/13/14 1459    Clinical Impression Statement Patient responded well to manual therapy increasing passive ROM to 110 deg flexion. Ext still limited actively to -7 deg.    PT Next Visit Plan MFR right ITB prn, joint mobs, ROM, sttengthening          G-Codes - 10/21/14 1414    Functional Assessment Tool Used FOTO   Functional Limitation Mobility: Walking and moving around   Mobility: Walking and Moving Around Current Status [phone]) At least 60 percent but less than 80 percent impaired, limited or restricted   Mobility: Walking and Moving Around Goal Status [phone]) At least 40 percent but less than 60 percent impaired, limited or restricted      Problem List Patient Active Problem List   Diagnosis Date Noted  . Acute blood loss anemia 09/20/2014  . Primary osteoarthritis of right knee 09/14/2014  . Primary osteoarthritis of knee 09/14/2014  . Vitamin B 12 deficiency 07/29/2014  . Obesity (BMI 30-39.9) 12/08/2013  . Osteoarthritis, knee 05/30/2012  . Metabolic syndrome 15/17/6160  . DVT, HX OF 02/10/2010  . Vitamin D deficiency 08/25/2007  . Disorder of bone and cartilage 04/08/2007  . Hypothyroidism 11/18/2006  . ANEMIA-NOS 11/18/2006  . Essential hypertension 11/18/2006  . GERD 11/18/2006   Madelyn Flavors PT  10/13/2014, 3:03 PM  [facility] [address], [place], [place], [address] Phone: [phone]   Fax:  [phone]

## 2014-10-14 ENCOUNTER — Ambulatory Visit: Payer: Medicare Other | Admitting: Physical Therapy

## 2014-10-14 DIAGNOSIS — R609 Edema, unspecified: Secondary | ICD-10-CM | POA: Diagnosis not present

## 2014-10-14 DIAGNOSIS — M25661 Stiffness of right knee, not elsewhere classified: Secondary | ICD-10-CM | POA: Diagnosis not present

## 2014-10-14 DIAGNOSIS — R531 Weakness: Secondary | ICD-10-CM | POA: Diagnosis not present

## 2014-10-14 DIAGNOSIS — R262 Difficulty in walking, not elsewhere classified: Secondary | ICD-10-CM | POA: Diagnosis not present

## 2014-10-14 NOTE — Patient Instructions (Signed)
  Straight Leg Raise: With External Leg Rotation   Lie on back with right leg straight, opposite leg bent. Rotate straight leg out and lift _10___ inches. Repeat __10__ times per set. Do _1-3___ sets per session. Do _1___ sessions per day.  http://orth.exer.us/728   Copyright  VHI. All rights reserved.  Strengthening: Terminal Knee Extension (Supine) 0  With right knee over bolster, straighten knee by tightening muscles on top of thigh. Keep bottom of knee on bolster. Can use weight. Hold 5 seconds. Repeat __10__ times per set. Do __1-3__ sets per session. Do _1___ sessions per day.  http://orth.exer.us/626   Copyright  VHI. All rights reserved.  Hamstring Curl: Resisted (Prone)   Use weight on left ankle, leg straight, bend knee. Repeat __10__ times per set. Do _1-3___ sets per session. Do 1____ sessions per day.  http://orth.exer.us/670   Copyright  VHI. All rights reserved.   Madelyn Flavors, PT 10/14/2014 11:04 AM [facility] [address] [place], [place] [address] Phone # [phone] Fax [phone]

## 2014-10-14 NOTE — Therapy (Signed)
[facility] [address] [place], [place], [address] Phone: [phone]   Fax:  [phone]  Physical Therapy Treatment  Patient Details  Name: Kimberly [name] MRN: [id number] Date of Birth: 1950-07-01 Referring Provider:  Garald Balding, MD  Encounter Date: 10/14/2014      PT End of Session - 10/14/14 1014    Visit Number 3   Date for PT Re-Evaluation 12/10/14   PT Start Time 1013   PT Stop Time 1118   PT Time Calculation (min) 65 min   Activity Tolerance Patient tolerated treatment well      Past Medical History  Diagnosis Date  . Anemia 2005    hb 11.5  . Hypertension   . Hypothyroidism     off meds  . DVT (deep venous thrombosis)     2003, coumadin x 6 mon  . GERD (gastroesophageal reflux disease) 2001    no problem now  . Arthritis   . Osteopenia   . Vegetarian diet     eats fish and eggs; no meat    Past Surgical History  Procedure Laterality Date  . Oophorectomy unilateral    . Arthroscopy right knee  03/16/03  . Total knee arthroplasty Right 09/14/2014    Procedure: RIGHT TOTAL KNEE ARTHROPLASTY;  Surgeon: Garald Balding, MD;  Location: [place];  Service: Orthopedics;  Laterality: Right;    There were no vitals filed for this visit.  Visit Diagnosis:  Stiffness of knee joint, right  Generalized weakness  Edema      Subjective Assessment - 10/14/14 1015    Subjective Patient reports she is doing HEP but not sure if doing them right. Left knee bothering more since not taking meds from surgery anymore. Back to baseline pain.   Currently in Pain? Yes   Pain Score 2    Pain Location Knee   Pain Orientation Right   Pain Descriptors / Indicators Aching   Pain Type Surgical pain   Pain Score 1   Pain Location Knee   Pain Orientation Left   Pain Descriptors / Indicators Aching   Pain Type Chronic pain            [facility] PT Assessment - 10/14/14 0001    Assessment   Medical  Diagnosis s/p Rt TKR   AROM   Right Knee Extension -7   Right Knee Flexion 98  110 passive                     [facility] Adult PT Treatment/Exercise - 10/14/14 0001    Knee/Hip Exercises: Stretches   Quad Stretch 2 reps;30 seconds;60 seconds  prone   ITB Stretch 1 rep;30 seconds  sideliying   Knee/Hip Exercises: Aerobic   Stationary Bike partial revolutions x 6 min   Knee/Hip Exercises: Supine   Quad Sets 1 set;5 reps   Short Arc Quad Sets Right;3 sets;10 reps  2#   Heel Slides AAROM;Right;1 set;15 reps  with strap   Straight Leg Raise with External Rotation 3 sets;10 reps   Knee/Hip Exercises: Prone   Hamstring Curl 1 set;10 reps  2#   Prone Knee Hang 1 minute   Modalities   Modalities Cryotherapy   Cryotherapy   Number Minutes Cryotherapy 15 Minutes   Cryotherapy Location Knee   Type of Cryotherapy --  vaso 3 * med   Manual Therapy   Manual Therapy Joint mobilization;Passive ROM   Joint Mobilization grade III/IV knee flex/ext  mobs   Myofascial Release Rt ITB in sidelying and HS/calf in prone  decreased TPs in ITB today   Passive ROM Rt knee flex/ext                PT Education - 10/14/14 1109    Education provided Yes   Education Details SLR with ER, SAQ and HS curl prone   Person(s) Educated Patient   Methods Explanation;Demonstration;Handout   Comprehension Verbalized understanding;Returned demonstration          PT Short Term Goals - 10/12/14 1409    PT SHORT TERM GOAL #1   Title I with initial HEP   Time 4   Period Weeks   Status New   PT SHORT TERM GOAL #2   Title improved Rt knee ROM -5 to 110   Time 4   Period Weeks   Status New   PT SHORT TERM GOAL #3   Title able to amb without AD safely   Time 4   Period Weeks   Status New           PT Long Term Goals - 10/12/14 1410    PT LONG TERM GOAL #1   Title I with advanced HEP   Time 8   Period Weeks   Status New   PT LONG TERM GOAL #2   Title improved Rt knee ROM  -2 to 118 degrees   Time 8   Period Weeks   Status New   PT LONG TERM GOAL #3   Title able to walk for 30 minutes without increased Rt knee pain   Time 8   Period Weeks   Status New   PT LONG TERM GOAL #4   Title able to climb stairs with a reciprocal gait   Time 8   Period Weeks   Status New   PT LONG TERM GOAL #5   Title demo 4+/5 BLE strength to improve function   Time 8   Period Weeks   Status New   Additional Long Term Goals   Additional Long Term Goals Yes   PT LONG TERM GOAL #6   Title improve FOTO to 46% limited   Time 8   Period Weeks   Status New               Plan - 10/14/14 1116    Clinical Impression Statement Patient continues to gain flex and ext in Rt knee. She has a weak but present right VMO contraction. contnued edema.   PT Next Visit Plan VMO strength. ROM, manual prn   Consulted and Agree with Plan of Care Patient        Problem List Patient Active Problem List   Diagnosis Date Noted  . Acute blood loss anemia 09/20/2014  . Primary osteoarthritis of right knee 09/14/2014  . Primary osteoarthritis of knee 09/14/2014  . Vitamin B 12 deficiency 07/29/2014  . Obesity (BMI 30-39.9) 12/08/2013  . Osteoarthritis, knee 05/30/2012  . Metabolic syndrome 29/47/6546  . DVT, HX OF 02/10/2010  . Vitamin D deficiency 08/25/2007  . Disorder of bone and cartilage 04/08/2007  . Hypothyroidism 11/18/2006  . ANEMIA-NOS 11/18/2006  . Essential hypertension 11/18/2006  . GERD 11/18/2006    Madelyn Flavors PT  10/14/2014, 11:22 AM  [facility] [address] [place], [place], [address] Phone: [phone]   Fax:  [phone]

## 2014-10-18 ENCOUNTER — Ambulatory Visit: Payer: Medicare Other | Admitting: Physical Therapy

## 2014-10-18 ENCOUNTER — Encounter: Payer: Self-pay | Admitting: Physical Therapy

## 2014-10-18 DIAGNOSIS — R609 Edema, unspecified: Secondary | ICD-10-CM

## 2014-10-18 DIAGNOSIS — R531 Weakness: Secondary | ICD-10-CM

## 2014-10-18 DIAGNOSIS — R262 Difficulty in walking, not elsewhere classified: Secondary | ICD-10-CM

## 2014-10-18 DIAGNOSIS — M25661 Stiffness of right knee, not elsewhere classified: Secondary | ICD-10-CM

## 2014-10-18 NOTE — Therapy (Signed)
[facility] [address], [place], [place], [address] Phone: [phone]   Fax:  [phone]  Physical Therapy Treatment  Patient Details  Name: Kimberly [name] MRN: [id number] Date of Birth: 13-Oct-1950 Referring Provider:  Eulas Post, MD  Encounter Date: 10/18/2014      PT End of Session - 10/18/14 1015    Visit Number 4   Number of Visits 10   Date for PT Re-Evaluation 12/10/14   PT Start Time 0929   PT Stop Time 1030   PT Time Calculation (min) 61 min   Activity Tolerance Patient tolerated treatment well   Behavior During Therapy [facility] for tasks assessed/performed      Past Medical History  Diagnosis Date  . Anemia 2005    hb 11.5  . Hypertension   . Hypothyroidism     off meds  . DVT (deep venous thrombosis)     2003, coumadin x 6 mon  . GERD (gastroesophageal reflux disease) 2001    no problem now  . Arthritis   . Osteopenia   . Vegetarian diet     eats fish and eggs; no meat    Past Surgical History  Procedure Laterality Date  . Oophorectomy unilateral    . Arthroscopy right knee  03/16/03  . Total knee arthroplasty Right 09/14/2014    Procedure: RIGHT TOTAL KNEE ARTHROPLASTY;  Surgeon: Garald Balding, MD;  Location: [place];  Service: Orthopedics;  Laterality: Right;    There were no vitals filed for this visit.  Visit Diagnosis:  Stiffness of knee joint, right  Generalized weakness  Edema  Difficulty walking      Subjective Assessment - 10/18/14 0933    Subjective I have a bike at home. Will try to ride daily. Overall feeling good. Not using cane much at all.   Currently in Pain? Yes   Pain Score 1    Pain Location Knee   Pain Orientation Right   Pain Descriptors / Indicators Dull   Aggravating Factors  Bending   Effect of Pain on Daily Activities Exercises   Multiple Pain Sites No            [facility] PT Assessment - 10/18/14 0001    AROM   Right Knee Flexion 103                      [facility] Adult PT Treatment/Exercise - 10/18/14 0001    Knee/Hip Exercises: Aerobic   Stationary Bike 7 min L1 full revolutions backwards and some forward with small hip hike.   Knee/Hip Exercises: Machines for Strengthening   Cybex Leg Press St 5 Bil 50# 20x   Knee/Hip Exercises: Standing   Lateral Step Up Right;2 sets;10 reps;Hand Hold: 1   Forward Step Up Right;2 sets;10 reps;Hand Hold: 1;Step Height: 4"   Rocker Board 3 minutes   Rebounder Wt shifting 1 min 3 way each   Knee/Hip Exercises: Seated   Long Arc Quad Strengthening;Right;2 sets;10 reps   Long Arc Quad Weight 2 lbs.   Long [employer] Limitations TActile cues to keep thigh down and just straighten the knee   Knee/Hip Exercises: Supine   Knee Flexion AAROM;Both  2 min on red ball prior to measurement.   Vasopneumatic   Number Minutes Vasopneumatic  15 minutes   Vasopnuematic Location  Knee   Vasopneumatic Pressure Medium   Vasopneumatic Temperature  3 flakes  PT Short Term Goals - 10/18/14 1017    PT SHORT TERM GOAL #1   Title I with initial HEP   Time 4   Period Weeks   Status Achieved   PT SHORT TERM GOAL #2   Title improved Rt knee ROM -5 to 110   Time 4   Period Weeks   Status On-going  Improving   PT SHORT TERM GOAL #3   Title able to amb without AD safely   Time 4   Period Weeks   Status On-going  Rarely using cane, but if she walks for a long time she will use her AD.           PT Long Term Goals - 10/12/14 1410    PT LONG TERM GOAL #1   Title I with advanced HEP   Time 8   Period Weeks   Status New   PT LONG TERM GOAL #2   Title improved Rt knee ROM -2 to 118 degrees   Time 8   Period Weeks   Status New   PT LONG TERM GOAL #3   Title able to walk for 30 minutes without increased Rt knee pain   Time 8   Period Weeks   Status New   PT LONG TERM GOAL #4   Title able to climb stairs with a reciprocal gait   Time 8   Period Weeks    Status New   PT LONG TERM GOAL #5   Title demo 4+/5 BLE strength to improve function   Time 8   Period Weeks   Status New   Additional Long Term Goals   Additional Long Term Goals Yes   PT LONG TERM GOAL #6   Title improve FOTO to 46% limited   Time 8   Period Weeks   Status New               Plan - 10/18/14 1016    Clinical Impression Statement ROM improving. Pt not using her cane much. Pain almost nonexistent.        Problem List Patient Active Problem List   Diagnosis Date Noted  . Acute blood loss anemia 09/20/2014  . Primary osteoarthritis of right knee 09/14/2014  . Primary osteoarthritis of knee 09/14/2014  . Vitamin B 12 deficiency 07/29/2014  . Obesity (BMI 30-39.9) 12/08/2013  . Osteoarthritis, knee 05/30/2012  . Metabolic syndrome 36/46/8032  . DVT, HX OF 02/10/2010  . Vitamin D deficiency 08/25/2007  . Disorder of bone and cartilage 04/08/2007  . Hypothyroidism 11/18/2006  . ANEMIA-NOS 11/18/2006  . Essential hypertension 11/18/2006  . GERD 11/18/2006    Idali Lafever, PTA 10/18/2014, 10:19 AM  [facility] [address], [place], [place], [address] Phone: [phone]   Fax:  [phone]

## 2014-10-19 DIAGNOSIS — E049 Nontoxic goiter, unspecified: Secondary | ICD-10-CM | POA: Diagnosis not present

## 2014-10-19 DIAGNOSIS — E039 Hypothyroidism, unspecified: Secondary | ICD-10-CM | POA: Diagnosis not present

## 2014-10-19 DIAGNOSIS — R7301 Impaired fasting glucose: Secondary | ICD-10-CM | POA: Diagnosis not present

## 2014-10-19 DIAGNOSIS — I10 Essential (primary) hypertension: Secondary | ICD-10-CM | POA: Diagnosis not present

## 2014-10-20 ENCOUNTER — Ambulatory Visit: Payer: Medicare Other | Admitting: Physical Therapy

## 2014-10-20 ENCOUNTER — Encounter: Payer: Self-pay | Admitting: Physical Therapy

## 2014-10-20 DIAGNOSIS — R262 Difficulty in walking, not elsewhere classified: Secondary | ICD-10-CM

## 2014-10-20 DIAGNOSIS — M25661 Stiffness of right knee, not elsewhere classified: Secondary | ICD-10-CM | POA: Diagnosis not present

## 2014-10-20 DIAGNOSIS — R609 Edema, unspecified: Secondary | ICD-10-CM | POA: Diagnosis not present

## 2014-10-20 DIAGNOSIS — R531 Weakness: Secondary | ICD-10-CM

## 2014-10-20 NOTE — Therapy (Signed)
[facility] [address] [place], [place], [address] Phone: [phone]   Fax:  [phone]  Physical Therapy Treatment  Patient Details  Name: Kimberly [name] MRN: [id number] Date of Birth: [birth year] Referring Provider:  Garald Balding, MD  Encounter Date: 10/20/2014      PT End of Session - 10/20/14 1120    Visit Number 5   Number of Visits 10   Date for PT Re-Evaluation 12/10/14   PT Start Time 1100   PT Stop Time 1202   PT Time Calculation (min) 62 min   Activity Tolerance Patient tolerated treatment well   Behavior During Therapy [facility] for tasks assessed/performed      Past Medical History  Diagnosis Date  . Anemia 2005    hb 11.5  . Hypertension   . Hypothyroidism     off meds  . DVT (deep venous thrombosis)     2003, coumadin x 6 mon  . GERD (gastroesophageal reflux disease) 2001    no problem now  . Arthritis   . Osteopenia   . Vegetarian diet     eats fish and eggs; no meat    Past Surgical History  Procedure Laterality Date  . Oophorectomy unilateral    . Arthroscopy right knee  03/16/03  . Total knee arthroplasty Right 09/14/2014    Procedure: RIGHT TOTAL KNEE ARTHROPLASTY;  Surgeon: Garald Balding, MD;  Location: [place];  Service: Orthopedics;  Laterality: Right;    There were no vitals filed for this visit.  Visit Diagnosis:  Stiffness of knee joint, right  Generalized weakness  Edema  Difficulty walking      Subjective Assessment - 10/20/14 1117    Subjective Pt reports using her bike at home x2 for 8 min each   Patient is accompained by: Family member  husband and granddaughter   How long can you stand comfortably? 5 min due to left knee paiin   How long can you walk comfortably? 10 min    Patient Stated Goals walk 2-3 miles, due staitionary bike, return to [employer]   Currently in Pain? Yes   Pain Score 1    Pain Location Knee   Pain Orientation Right   Pain Descriptors  / Indicators Dull   Pain Type Surgical pain   Pain Onset More than a month ago   Pain Frequency Intermittent   Multiple Pain Sites No                         [facility] Adult PT Treatment/Exercise - 10/20/14 0001    Knee/Hip Exercises: Aerobic   Stationary Bike 8 min L1 full revolutions, small hip hike.   Knee/Hip Exercises: Machines for Strengthening   Cybex Leg Press St 5  70# 3x10 B LE, 30# 2x10 Rt LE   Knee/Hip Exercises: Standing   Lateral Step Up Right;2 sets;10 reps;Hand Hold: 1   Forward Step Up Right;2 sets;10 reps;Hand Hold: 1;Step Height: 4"   Rocker Board 3 minutes   Rebounder Wt shifting 1 min 3 way each   Knee/Hip Exercises: Supine   Knee Flexion AAROM;Both  2 min with red ball   Modalities   Modalities --   Vasopneumatic   Number Minutes Vasopneumatic  15 minutes   Vasopnuematic Location  Knee   Vasopneumatic Pressure Medium   Vasopneumatic Temperature  3 flakes  PT Short Term Goals - 10/18/14 1017    PT SHORT TERM GOAL #1   Title I with initial HEP   Time 4   Period Weeks   Status Achieved   PT SHORT TERM GOAL #2   Title improved Rt knee ROM -5 to 110   Time 4   Period Weeks   Status On-going  Improving   PT SHORT TERM GOAL #3   Title able to amb without AD safely   Time 4   Period Weeks   Status On-going  Rarely using cane, but if she walks for a long time she will use her AD.           PT Long Term Goals - 10/20/14 1137    PT LONG TERM GOAL #1   Title I with advanced HEP   Time 8   Period Weeks   Status On-going   PT LONG TERM GOAL #2   Title improved Rt knee ROM -2 to 118 degrees   Time 8   Period Weeks   Status On-going   PT LONG TERM GOAL #3   Title able to walk for 30 minutes without increased Rt knee pain   Time 8   Period Weeks   Status On-going   PT LONG TERM GOAL #4   Title able to climb stairs with a reciprocal gait   Time 8   Period Weeks   Status On-going   PT LONG TERM GOAL  #5   Title demo 4+/5 BLE strength to improve function   Time 8   Period Weeks   Status On-going               Plan - 10/20/14 1120    Clinical Impression Statement Pt arrived with cane, but not using it during session. Pain almost nonexistent   Pt will benefit from skilled therapeutic intervention in order to improve on the following deficits Abnormal gait;Decreased range of motion;Difficulty walking;Decreased activity tolerance;Pain;Decreased mobility;Decreased strength;Increased edema   Rehab Potential Excellent   PT Frequency 3x / week   PT Duration 8 weeks   PT Treatment/Interventions ADLs/Self Care Home Management;Cryotherapy;Electrical Stimulation;Ultrasound;Moist Heat;Stair training;Gait training;Therapeutic exercise;Balance training;Neuromuscular re-education;Patient/family education;Manual techniques;Scar mobilization;Passive range of motion;Taping;Vasopneumatic Device   PT Next Visit Plan VMO strength. ROM, manual prn   Consulted and Agree with Plan of Care Patient        Problem List Patient Active Problem List   Diagnosis Date Noted  . Acute blood loss anemia 09/20/2014  . Primary osteoarthritis of right knee 09/14/2014  . Primary osteoarthritis of knee 09/14/2014  . Vitamin B 12 deficiency 07/29/2014  . Obesity (BMI 30-39.9) 12/08/2013  . Osteoarthritis, knee 05/30/2012  . Metabolic syndrome 96/75/9163  . DVT, HX OF 02/10/2010  . Vitamin D deficiency 08/25/2007  . Disorder of bone and cartilage 04/08/2007  . Hypothyroidism 11/18/2006  . ANEMIA-NOS 11/18/2006  . Essential hypertension 11/18/2006  . GERD 11/18/2006    NAUMANN-HOUEGNIFIO,Yarel Rushlow PTA 10/20/2014, 11:47 AM  [facility] [address] [place], [place], [address] Phone: [phone]   Fax:  [phone]

## 2014-10-22 ENCOUNTER — Ambulatory Visit: Payer: Medicare Other | Admitting: Physical Therapy

## 2014-10-22 ENCOUNTER — Encounter: Payer: Self-pay | Admitting: Physical Therapy

## 2014-10-22 DIAGNOSIS — R531 Weakness: Secondary | ICD-10-CM | POA: Diagnosis not present

## 2014-10-22 DIAGNOSIS — R262 Difficulty in walking, not elsewhere classified: Secondary | ICD-10-CM | POA: Diagnosis not present

## 2014-10-22 DIAGNOSIS — M25661 Stiffness of right knee, not elsewhere classified: Secondary | ICD-10-CM | POA: Diagnosis not present

## 2014-10-22 DIAGNOSIS — R609 Edema, unspecified: Secondary | ICD-10-CM | POA: Diagnosis not present

## 2014-10-22 NOTE — Therapy (Signed)
[facility] [address] [place], [place], [address] Phone: [phone]   Fax:  [phone]  Physical Therapy Treatment  Patient Details  Name: Kimberly [name] MRN: [id number] Date of Birth: 1950-11-05 Referring Provider:  Eulas Post, MD  Encounter Date: 10/22/2014      PT End of Session - 10/22/14 1156    Visit Number 6   Number of Visits 10   Date for PT Re-Evaluation 12/10/14   PT Start Time 1100   PT Stop Time 1201   PT Time Calculation (min) 61 min   Activity Tolerance Patient tolerated treatment well   Behavior During Therapy [facility] for tasks assessed/performed      Past Medical History  Diagnosis Date  . Anemia 2005    hb 11.5  . Hypertension   . Hypothyroidism     off meds  . DVT (deep venous thrombosis)     2003, coumadin x 6 mon  . GERD (gastroesophageal reflux disease) 2001    no problem now  . Arthritis   . Osteopenia   . Vegetarian diet     eats fish and eggs; no meat    Past Surgical History  Procedure Laterality Date  . Oophorectomy unilateral    . Arthroscopy right knee  03/16/03  . Total knee arthroplasty Right 09/14/2014    Procedure: RIGHT TOTAL KNEE ARTHROPLASTY;  Surgeon: Garald Balding, MD;  Location: [place];  Service: Orthopedics;  Laterality: Right;    There were no vitals filed for this visit.  Visit Diagnosis:  Stiffness of knee joint, right  Generalized weakness  Edema  Difficulty walking      Subjective Assessment - 10/22/14 1116    Subjective Pt reports she incr the time on the bike at home to 9 min and did it two times     Currently in Pain? Yes   Pain Score 1    Pain Location Knee   Pain Orientation Right   Pain Descriptors / Indicators Dull   Pain Type Surgical pain   Pain Onset More than a month ago   Pain Frequency Intermittent   Multiple Pain Sites Yes   Pain Score 0   Pain Location Knee                         [facility] Adult  PT Treatment/Exercise - 10/22/14 0001    Knee/Hip Exercises: Aerobic   Stationary Bike 8 min L1 full revolutions, small hip hike.   Knee/Hip Exercises: Machines for Strengthening   Cybex Leg Press St 5  70# 3x10 B LE, 30# 2x10 Rt LE   Knee/Hip Exercises: Standing   Lateral Step Up Right;2 sets;10 reps;Hand Hold: 1   Forward Step Up Right;2 sets;10 reps;Hand Hold: 1;Step Height: 4"   Rocker Board 3 minutes   Rebounder Wt shifting 1 min 3 way each   Knee/Hip Exercises: Supine   Knee Flexion AAROM;Both   Vasopneumatic   Number Minutes Vasopneumatic  15 minutes   Vasopnuematic Location  Knee   Vasopneumatic Pressure Medium   Vasopneumatic Temperature  3 flakes   Manual Therapy   Manual Therapy Soft tissue mobilization;Other (comment)  Quadriceps and surrounding patella with focus on incision si                  PT Short Term Goals - 10/18/14 1017    PT SHORT TERM GOAL #1   Title I with initial HEP  Time 4   Period Weeks   Status Achieved   PT SHORT TERM GOAL #2   Title improved Rt knee ROM -5 to 110   Time 4   Period Weeks   Status On-going  Improving   PT SHORT TERM GOAL #3   Title able to amb without AD safely   Time 4   Period Weeks   Status On-going  Rarely using cane, but if she walks for a long time she will use her AD.           PT Long Term Goals - 10/20/14 1137    PT LONG TERM GOAL #1   Title I with advanced HEP   Time 8   Period Weeks   Status On-going   PT LONG TERM GOAL #2   Title improved Rt knee ROM -2 to 118 degrees   Time 8   Period Weeks   Status On-going   PT LONG TERM GOAL #3   Title able to walk for 30 minutes without increased Rt knee pain   Time 8   Period Weeks   Status On-going   PT LONG TERM GOAL #4   Title able to climb stairs with a reciprocal gait   Time 8   Period Weeks   Status On-going   PT LONG TERM GOAL #5   Title demo 4+/5 BLE strength to improve function   Time 8   Period Weeks   Status On-going                Plan - 10/22/14 1157    Clinical Impression Statement Pt ambulatates all distance needed in gym wihtout asistive device, progressing toward all LTG's   Pt will benefit from skilled therapeutic intervention in order to improve on the following deficits Abnormal gait;Decreased range of motion;Difficulty walking;Decreased activity tolerance;Pain;Decreased mobility;Decreased strength;Increased edema   PT Frequency 3x / week   PT Duration 8 weeks   PT Treatment/Interventions ADLs/Self Care Home Management;Cryotherapy;Electrical Stimulation;Ultrasound;Moist Heat;Stair training;Gait training;Therapeutic exercise;Balance training;Neuromuscular re-education;Patient/family education;Manual techniques;Scar mobilization;Passive range of motion;Taping;Vasopneumatic Device   PT Next Visit Plan VMO strength. ROM, manual prn   Consulted and Agree with Plan of Care Patient        Problem List Patient Active Problem List   Diagnosis Date Noted  . Acute blood loss anemia 09/20/2014  . Primary osteoarthritis of right knee 09/14/2014  . Primary osteoarthritis of knee 09/14/2014  . Vitamin B 12 deficiency 07/29/2014  . Obesity (BMI 30-39.9) 12/08/2013  . Osteoarthritis, knee 05/30/2012  . Metabolic syndrome 63/84/6659  . DVT, HX OF 02/10/2010  . Vitamin D deficiency 08/25/2007  . Disorder of bone and cartilage 04/08/2007  . Hypothyroidism 11/18/2006  . ANEMIA-NOS 11/18/2006  . Essential hypertension 11/18/2006  . GERD 11/18/2006    NAUMANN-HOUEGNIFIO,Tiberius Loftus PTA 10/22/2014, 12:01 PM  [facility] [address], [place], [place], [address] Phone: [phone]   Fax:  [phone]

## 2014-10-25 ENCOUNTER — Ambulatory Visit: Payer: Medicare Other | Admitting: Physical Therapy

## 2014-10-27 ENCOUNTER — Ambulatory Visit: Payer: Medicare Other | Admitting: Physical Therapy

## 2014-10-27 ENCOUNTER — Encounter: Payer: Self-pay | Admitting: Physical Therapy

## 2014-10-27 DIAGNOSIS — R609 Edema, unspecified: Secondary | ICD-10-CM

## 2014-10-27 DIAGNOSIS — M25661 Stiffness of right knee, not elsewhere classified: Secondary | ICD-10-CM | POA: Diagnosis not present

## 2014-10-27 DIAGNOSIS — R262 Difficulty in walking, not elsewhere classified: Secondary | ICD-10-CM

## 2014-10-27 DIAGNOSIS — R531 Weakness: Secondary | ICD-10-CM | POA: Diagnosis not present

## 2014-10-27 NOTE — Therapy (Signed)
[facility] [address], [place], [place], [address] Phone: [phone]   Fax:  [phone]  Physical Therapy Treatment  Patient Details  Name: Kimberly [name] MRN: [id number] Date of Birth: [birth year] Referring Provider:  Garald Balding, MD  Encounter Date: 10/27/2014      PT End of Session - 10/27/14 1058    Visit Number 7   Number of Visits 10  Medicare   Date for PT Re-Evaluation 12/10/14   PT Start Time 2355   PT Stop Time 1110   PT Time Calculation (min) 55 min   Activity Tolerance Patient tolerated treatment well   Behavior During Therapy [facility] for tasks assessed/performed      Past Medical History  Diagnosis Date  . Anemia 2005    hb 11.5  . Hypertension   . Hypothyroidism     off meds  . DVT (deep venous thrombosis)     2003, coumadin x 6 mon  . GERD (gastroesophageal reflux disease) 2001    no problem now  . Arthritis   . Osteopenia   . Vegetarian diet     eats fish and eggs; no meat    Past Surgical History  Procedure Laterality Date  . Oophorectomy unilateral    . Arthroscopy right knee  03/16/03  . Total knee arthroplasty Right 09/14/2014    Procedure: RIGHT TOTAL KNEE ARTHROPLASTY;  Surgeon: Garald Balding, MD;  Location: [place];  Service: Orthopedics;  Laterality: Right;    There were no vitals filed for this visit.  Visit Diagnosis:  Stiffness of knee joint, right  Difficulty walking  Edema  Generalized weakness      Subjective Assessment - 10/27/14 1025    Subjective I am doing well. I was in [place] walking and my knee was fine but tired. I have trouble walking due to me waddling side to side.    How long can you stand comfortably? 10 min   How long can you walk comfortably? 30 min.   Patient Stated Goals walk 2-3 miles, due staitionary bike, return to [employer]   Currently in Pain? Yes   Pain Score 1    Pain Location Knee   Pain Orientation Right   Pain Descriptors  / Indicators Dull   Pain Type Surgical pain   Pain Onset More than a month ago   Pain Frequency Intermittent   Aggravating Factors  Bending    Effect of Pain on Daily Activities exercise   Multiple Pain Sites No            [facility] PT Assessment - 10/27/14 0001    Assessment   Medical Diagnosis s/p Rt TKR   AROM   Right Knee Flexion 105                     [facility] Adult PT Treatment/Exercise - 10/27/14 0001    Knee/Hip Exercises: Aerobic   Stationary Bike 8 min L1 full revolutions, small hip hike.   Knee/Hip Exercises: Machines for Strengthening   Cybex Leg Press St 5  70# 3x10 B LE, 30# 2x10 Rt LE   Knee/Hip Exercises: Standing   Lateral Step Up Right;2 sets;10 reps;Hand Hold: 1  vc to fully extend knee   Forward Step Up Right;2 sets;10 reps;Hand Hold: 1;Step Height: 4"   Rebounder Wt shifting 1 min 3 way each  VC to not move trunk    Vasopneumatic   Number Minutes Vasopneumatic  15 minutes  Vasopnuematic Location  Knee   Vasopneumatic Pressure Medium   Vasopneumatic Temperature  3 flakes   Manual Therapy   Manual Therapy Soft tissue mobilization;Other (comment)  Quadriceps and surrounding patella with focus on incision si   Passive ROM Rt knee flex/ext                PT Education - 10/27/14 1058    Education provided No          PT Short Term Goals - 10/27/14 1059    PT SHORT TERM GOAL #2   Title improved Rt knee ROM -5 to 110   Time 4   Period Weeks   Status On-going  flexion 105   PT SHORT TERM GOAL #3   Title able to amb without AD safely   Time 4   Period Weeks   Status On-going           PT Long Term Goals - 10/20/14 1137    PT LONG TERM GOAL #1   Title I with advanced HEP   Time 8   Period Weeks   Status On-going   PT LONG TERM GOAL #2   Title improved Rt knee ROM -2 to 118 degrees   Time 8   Period Weeks   Status On-going   PT LONG TERM GOAL #3   Title able to walk for 30 minutes without increased Rt knee pain    Time 8   Period Weeks   Status On-going   PT LONG TERM GOAL #4   Title able to climb stairs with a reciprocal gait   Time 8   Period Weeks   Status On-going   PT LONG TERM GOAL #5   Title demo 4+/5 BLE strength to improve function   Time 8   Period Weeks   Status On-going               Plan - 10/27/14 1059    Clinical Impression Statement Patient ambulates with a waddling of her trunk and is concerned about it.  Patient has increased right knee flexion to 105 degrees.  Patient is now able to walk for 30 min. compared to 5 min.  Patient is able to sit for 10 min compared to 5 min.  Patient need strength for knee extension. Patient needs to work on knee flexion.    Pt will benefit from skilled therapeutic intervention in order to improve on the following deficits Abnormal gait;Decreased range of motion;Difficulty walking;Decreased activity tolerance;Pain;Decreased mobility;Decreased strength;Increased edema   Rehab Potential Excellent   PT Frequency 3x / week   PT Duration 8 weeks   PT Treatment/Interventions ADLs/Self Care Home Management;Cryotherapy;Electrical Stimulation;Ultrasound;Moist Heat;Stair training;Gait training;Therapeutic exercise;Balance training;Neuromuscular re-education;Patient/family education;Manual techniques;Scar mobilization;Passive range of motion;Taping;Vasopneumatic Device   PT Next Visit Plan VMO strength. ROM, manual prn;    PT Home Exercise Plan knee theraband exercises   Consulted and Agree with Plan of Care Patient        Problem List Patient Active Problem List   Diagnosis Date Noted  . Acute blood loss anemia 09/20/2014  . Primary osteoarthritis of right knee 09/14/2014  . Primary osteoarthritis of knee 09/14/2014  . Vitamin B 12 deficiency 07/29/2014  . Obesity (BMI 30-39.9) 12/08/2013  . Osteoarthritis, knee 05/30/2012  . Metabolic syndrome 98/03/9146  . DVT, HX OF 02/10/2010  . Vitamin D deficiency 08/25/2007  . Disorder of bone  and cartilage 04/08/2007  . Hypothyroidism 11/18/2006  . ANEMIA-NOS 11/18/2006  . Essential hypertension 11/18/2006  .  GERD 11/18/2006    Harlowe Dowler,PT 10/27/2014, 11:03 AM  [facility] [address], [place], [place], [address] Phone: [phone]   Fax:  [phone]

## 2014-10-29 ENCOUNTER — Ambulatory Visit: Payer: Medicare Other | Admitting: Physical Therapy

## 2014-10-29 ENCOUNTER — Encounter: Payer: Self-pay | Admitting: Physical Therapy

## 2014-10-29 DIAGNOSIS — R609 Edema, unspecified: Secondary | ICD-10-CM | POA: Diagnosis not present

## 2014-10-29 DIAGNOSIS — R262 Difficulty in walking, not elsewhere classified: Secondary | ICD-10-CM | POA: Diagnosis not present

## 2014-10-29 DIAGNOSIS — M25661 Stiffness of right knee, not elsewhere classified: Secondary | ICD-10-CM | POA: Diagnosis not present

## 2014-10-29 DIAGNOSIS — R531 Weakness: Secondary | ICD-10-CM | POA: Diagnosis not present

## 2014-10-29 NOTE — Therapy (Signed)
[facility] [address], [place], [place], [address] Phone: [phone]   Fax:  [phone]  Physical Therapy Treatment  Patient Details  Name: Dayana Dalporto MRN: [id number] Date of Birth: Jul 11, 1950 Referring Provider:  Eulas Post, MD  Encounter Date: 10/29/2014      PT End of Session - 10/29/14 0953    Visit Number 8   Number of Visits 10   Date for PT Re-Evaluation 12/10/14   PT Start Time 0922   PT Stop Time 1029   PT Time Calculation (min) 67 min   Activity Tolerance Patient tolerated treatment well   Behavior During Therapy [facility] for tasks assessed/performed      Past Medical History  Diagnosis Date  . Anemia 2005    hb 11.5  . Hypertension   . Hypothyroidism     off meds  . DVT (deep venous thrombosis)     2003, coumadin x 6 mon  . GERD (gastroesophageal reflux disease) 2001    no problem now  . Arthritis   . Osteopenia   . Vegetarian diet     eats fish and eggs; no meat    Past Surgical History  Procedure Laterality Date  . Oophorectomy unilateral    . Arthroscopy right knee  03/16/03  . Total knee arthroplasty Right 09/14/2014    Procedure: RIGHT TOTAL KNEE ARTHROPLASTY;  Surgeon: Garald Balding, MD;  Location: [place];  Service: Orthopedics;  Laterality: Right;    There were no vitals filed for this visit.  Visit Diagnosis:  Stiffness of knee joint, right  Difficulty walking  Edema  Generalized weakness      Subjective Assessment - 10/29/14 0940    Subjective After last visit my knee was hurting and I had to take Oxycodin. No c/o of pain today   Currently in Pain? No/denies                         [facility] Adult PT Treatment/Exercise - 10/29/14 0001    Knee/Hip Exercises: Aerobic   Stationary Bike 14 min L1 full revolutions, small hip hike.   Elliptical 2min incline & resistance level 2   Knee/Hip Exercises: Machines for Strengthening   Cybex Leg Press St  4  75# 3x10 B LE, 30# 3x10 Rt LE   Knee/Hip Exercises: Standing   Rebounder Wt shifting 1 min 3 way each   Vasopneumatic   Number Minutes Vasopneumatic  15 minutes   Vasopnuematic Location  Knee   Vasopneumatic Pressure Medium   Vasopneumatic Temperature  3 flakes   Manual Therapy   Manual Therapy Soft tissue mobilization;Other (comment)   Passive ROM Rt knee flex/ext                  PT Short Term Goals - 10/27/14 1059    PT SHORT TERM GOAL #2   Title improved Rt knee ROM -5 to 110   Time 4   Period Weeks   Status On-going  flexion 105   PT SHORT TERM GOAL #3   Title able to amb without AD safely   Time 4   Period Weeks   Status On-going           PT Long Term Goals - 10/20/14 1137    PT LONG TERM GOAL #1   Title I with advanced HEP   Time 8   Period Weeks   Status On-going   PT LONG TERM  GOAL #2   Title improved Rt knee ROM -2 to 118 degrees   Time 8   Period Weeks   Status On-going   PT LONG TERM GOAL #3   Title able to walk for 30 minutes without increased Rt knee pain   Time 8   Period Weeks   Status On-going   PT LONG TERM GOAL #4   Title able to climb stairs with a reciprocal gait   Time 8   Period Weeks   Status On-going   PT LONG TERM GOAL #5   Title demo 4+/5 BLE strength to improve function   Time 8   Period Weeks   Status On-going               Plan - 10/29/14 1134    Clinical Impression Statement Pateint ambulates with a waddling gait. Patient is gaining with strength as reported in incr walking distance. Knee flexion is limited   Pt will benefit from skilled therapeutic intervention in order to improve on the following deficits Abnormal gait;Decreased range of motion;Difficulty walking;Decreased activity tolerance;Pain;Decreased mobility;Decreased strength;Increased edema   Rehab Potential Excellent   PT Frequency 3x / week   PT Treatment/Interventions ADLs/Self Care Home Management;Cryotherapy;Electrical  Stimulation;Ultrasound;Moist Heat;Stair training;Gait training;Therapeutic exercise;Balance training;Neuromuscular re-education;Patient/family education;Manual techniques;Scar mobilization;Passive range of motion;Taping;Vasopneumatic Device   PT Next Visit Plan VMO strength. ROM, manual prn;    PT Home Exercise Plan knee theraband exercises        Problem List Patient Active Problem List   Diagnosis Date Noted  . Acute blood loss anemia 09/20/2014  . Primary osteoarthritis of right knee 09/14/2014  . Primary osteoarthritis of knee 09/14/2014  . Vitamin B 12 deficiency 07/29/2014  . Obesity (BMI 30-39.9) 12/08/2013  . Osteoarthritis, knee 05/30/2012  . Metabolic syndrome 74/04/8785  . DVT, HX OF 02/10/2010  . Vitamin D deficiency 08/25/2007  . Disorder of bone and cartilage 04/08/2007  . Hypothyroidism 11/18/2006  . ANEMIA-NOS 11/18/2006  . Essential hypertension 11/18/2006  . GERD 11/18/2006    NAUMANN-HOUEGNIFIO,Delia Sitar PTA 10/29/2014, 11:38 AM  [facility] [address] [place], [place], [address] Phone: [phone]   Fax:  [phone]

## 2014-11-01 ENCOUNTER — Encounter: Payer: Self-pay | Admitting: Physical Therapy

## 2014-11-01 ENCOUNTER — Ambulatory Visit: Payer: Medicare Other | Admitting: Physical Therapy

## 2014-11-01 DIAGNOSIS — R262 Difficulty in walking, not elsewhere classified: Secondary | ICD-10-CM | POA: Diagnosis not present

## 2014-11-01 DIAGNOSIS — R609 Edema, unspecified: Secondary | ICD-10-CM | POA: Diagnosis not present

## 2014-11-01 DIAGNOSIS — R531 Weakness: Secondary | ICD-10-CM | POA: Diagnosis not present

## 2014-11-01 DIAGNOSIS — M25661 Stiffness of right knee, not elsewhere classified: Secondary | ICD-10-CM | POA: Diagnosis not present

## 2014-11-01 NOTE — Therapy (Signed)
[facility] [address] [place], [place], [address] Phone: [phone]   Fax:  [phone]  Physical Therapy Treatment  Patient Details  Name: Kimberly [name] MRN: [id number] Date of Birth: December 17, 1950 Referring Provider:  Eulas Post, MD  Encounter Date: 11/01/2014      PT End of Session - 11/01/14 1039    Visit Number 9   Number of Visits 10   Date for PT Re-Evaluation 12/10/14   PT Start Time 1010   PT Stop Time 1115   PT Time Calculation (min) 65 min   Activity Tolerance Patient tolerated treatment well   Behavior During Therapy [facility] for tasks assessed/performed      Past Medical History  Diagnosis Date  . Anemia 2005    hb 11.5  . Hypertension   . Hypothyroidism     off meds  . DVT (deep venous thrombosis)     2003, coumadin x 6 mon  . GERD (gastroesophageal reflux disease) 2001    no problem now  . Arthritis   . Osteopenia   . Vegetarian diet     eats fish and eggs; no meat    Past Surgical History  Procedure Laterality Date  . Oophorectomy unilateral    . Arthroscopy right knee  03/16/03  . Total knee arthroplasty Right 09/14/2014    Procedure: RIGHT TOTAL KNEE ARTHROPLASTY;  Surgeon: Garald Balding, MD;  Location: [place];  Service: Orthopedics;  Laterality: Right;    There were no vitals filed for this visit.  Visit Diagnosis:  Stiffness of knee joint, right  Difficulty walking  Edema  Generalized weakness      Subjective Assessment - 11/01/14 1030    Subjective Pt felt good after last visit, but reports her left knee causes pain with standing and walking what may causes the wabbeling gait   How long can you stand comfortably? 57min  now mainly limited dut ot Lt knee   How long can you walk comfortably? 62min   Patient Stated Goals walk 2-3 miles, due staitionary bike, return to [place]   Currently in Pain? No/denies   Multiple Pain Sites No                          [facility] Adult PT Treatment/Exercise - 11/01/14 0001    Knee/Hip Exercises: Aerobic   Stationary Bike 11 min L1 full revolutions, small hip hike.   Knee/Hip Exercises: Machines for Strengthening   Cybex Leg Press St 4  75# 3x10 B LE, 30# 3x10 Rt LE   Knee/Hip Exercises: Standing   Lateral Step Up Right;2 sets;10 reps;Hand Hold: 1   Forward Step Up Right;2 sets;10 reps;Hand Hold: 1;Step Height: 4"   Rebounder Wt shifting 1 min 3 way each   Vasopneumatic   Number Minutes Vasopneumatic  15 minutes   Vasopnuematic Location  Knee   Vasopneumatic Pressure Medium   Vasopneumatic Temperature  3 flakes   Manual Therapy   Manual Therapy Soft tissue mobilization;Other (comment)   Passive ROM Rt knee flex/ext                  PT Short Term Goals - 11/01/14 1042    PT SHORT TERM GOAL #1   Title I with initial HEP   Time 4   Period Weeks   Status Achieved   PT SHORT TERM GOAL #2   Title improved Rt knee ROM -5 to 110  AROM  in degrees: Rt knee Flexion 100,  ext  -3 both taken in supine    Time 4   Period Weeks   Status On-going   PT SHORT TERM GOAL #3   Title able to amb without AD safely   Time 4   Period Weeks   Status Achieved           PT Long Term Goals - 11/01/14 1052    PT LONG TERM GOAL #1   Title I with advanced HEP   Time 8   Period Weeks   Status On-going   PT LONG TERM GOAL #2   Title improved Rt knee ROM -2 to 118 degrees  Rt knee AROM 100 as of June 21   Time 8   Period Weeks   Status On-going   PT LONG TERM GOAL #3   Title able to walk for 30 minutes without increased Rt knee pain   Time 8   Period Weeks   Status Achieved   PT LONG TERM GOAL #4   Title able to climb stairs with a reciprocal gait   Time 8   Period Weeks   Status On-going   PT LONG TERM GOAL #5   Title demo 4+/5 BLE strength to improve function   Time 8   Period Weeks   Status On-going   PT LONG TERM GOAL #6   Title improve FOTO to  46% limited   Time 8   Period Weeks   Status On-going               Plan - 11/01/14 1039    Clinical Impression Statement Pt continues to walk with waddling gait. Pt is improveing with activity toleracne.    Rehab Potential Excellent   PT Frequency 3x / week   PT Duration 8 weeks   PT Treatment/Interventions ADLs/Self Care Home Management;Cryotherapy;Electrical Stimulation;Ultrasound;Moist Heat;Stair training;Gait training;Therapeutic exercise;Balance training;Neuromuscular re-education;Patient/family education;Manual techniques;Scar mobilization;Passive range of motion;Taping;Vasopneumatic Device   PT Next Visit Plan Continue with strength, endurance, MT to release tightness on incision side   PT Home Exercise Plan knee theraband exercises   Consulted and Agree with Plan of Care Patient        Problem List Patient Active Problem List   Diagnosis Date Noted  . Acute blood loss anemia 09/20/2014  . Primary osteoarthritis of right knee 09/14/2014  . Primary osteoarthritis of knee 09/14/2014  . Vitamin B 12 deficiency 07/29/2014  . Obesity (BMI 30-39.9) 12/08/2013  . Osteoarthritis, knee 05/30/2012  . Metabolic syndrome 86/57/8469  . DVT, HX OF 02/10/2010  . Vitamin D deficiency 08/25/2007  . Disorder of bone and cartilage 04/08/2007  . Hypothyroidism 11/18/2006  . ANEMIA-NOS 11/18/2006  . Essential hypertension 11/18/2006  . GERD 11/18/2006    NAUMANN-HOUEGNIFIO,Anye Brose PTA 11/01/2014, 11:02 AM  [facility] [address] [place], [place], [address] Phone: [phone]   Fax:  [phone]

## 2014-11-03 ENCOUNTER — Ambulatory Visit: Payer: Medicare Other | Admitting: Physical Therapy

## 2014-11-03 DIAGNOSIS — M25661 Stiffness of right knee, not elsewhere classified: Secondary | ICD-10-CM | POA: Diagnosis not present

## 2014-11-03 DIAGNOSIS — R609 Edema, unspecified: Secondary | ICD-10-CM

## 2014-11-03 DIAGNOSIS — R262 Difficulty in walking, not elsewhere classified: Secondary | ICD-10-CM

## 2014-11-03 DIAGNOSIS — R531 Weakness: Secondary | ICD-10-CM

## 2014-11-03 NOTE — Therapy (Signed)
[facility] [address], [place], [place], [address] Phone: [phone]   Fax:  [phone]  Physical Therapy Treatment  Patient Details  Name: Kimberly [name] MRN: [id number] Date of Birth: December 19, 1950 Referring Provider:  Eulas Post, MD  Encounter Date: 11/03/2014      PT End of Session - 11/03/14 1126    Visit Number 10   Number of Visits 10   Date for PT Re-Evaluation 12/10/14   PT Start Time 1100   PT Stop Time 1203   PT Time Calculation (min) 63 min   Activity Tolerance Patient tolerated treatment well   Behavior During Therapy [facility] for tasks assessed/performed      Past Medical History  Diagnosis Date  . Anemia 2005    hb 11.5  . Hypertension   . Hypothyroidism     off meds  . DVT (deep venous thrombosis)     2003, coumadin x 6 mon  . GERD (gastroesophageal reflux disease) 2001    no problem now  . Arthritis   . Osteopenia   . Vegetarian diet     eats fish and eggs; no meat    Past Surgical History  Procedure Laterality Date  . Oophorectomy unilateral    . Arthroscopy right knee  03/16/03  . Total knee arthroplasty Right 09/14/2014    Procedure: RIGHT TOTAL KNEE ARTHROPLASTY;  Surgeon: Garald Balding, MD;  Location: [place];  Service: Orthopedics;  Laterality: Right;    There were no vitals filed for this visit.  Visit Diagnosis:  Stiffness of knee joint, right  Difficulty walking  Edema  Generalized weakness      Subjective Assessment - 11/03/14 1118    Subjective Pt reports standing and walking stamina improved and transfers sit to stand is getting easier   Patient is accompained by: Family member  husband   Limitations Standing;Walking   Currently in Pain? Yes   Pain Score 1    Pain Location Knee  lat step up's causes pain up to 3/10   Pain Orientation Right   Pain Descriptors / Indicators Dull   Pain Type Surgical pain   Pain Onset More than a month ago   Pain  Frequency Intermittent   Aggravating Factors  bending, increased activity level   Multiple Pain Sites No            [facility] PT Assessment - 11/03/14 0001    Assessment   Medical Diagnosis s/p Rt TKR   Observation/Other Assessments   Focus on Therapeutic Outcomes (FOTO)  48% limitation status is CK  evaluation 61%   ROM / Strength   AROM / PROM / Strength --   AROM   Right Knee Extension -4   Right Knee Flexion 105   Strength   Right Knee Flexion 4/5   Right Knee Extension 4-/5                     [facility] Adult PT Treatment/Exercise - 11/03/14 0001    Knee/Hip Exercises: Aerobic   Stationary Bike 11 min L1 full revolutions, small hip hike.   Tread Mill 1.82mh, with focus on step length and pace   Knee/Hip Exercises: Machines for Strengthening   Cybex Leg Press St 4  75# 3x10 B LE, 30# 3x10 Rt LE   Knee/Hip Exercises: Standing   Lateral Step Up Right;2 sets;10 reps;Hand Hold: 1   Forward Step Up Right;2 sets;10 reps;Hand Hold: 1;Step Height: 4"  Rebounder Wt shifting 1 min 3 way each   Vasopneumatic   Number Minutes Vasopneumatic  15 minutes   Vasopnuematic Location  Knee   Vasopneumatic Pressure Medium   Vasopneumatic Temperature  3 flakes  place towel in sleeve                  PT Short Term Goals - 11/01/14 1042    PT SHORT TERM GOAL #1   Title I with initial HEP   Time 4   Period Weeks   Status Achieved   PT SHORT TERM GOAL #2   Title improved Rt knee ROM -5 to 110  AROM in degrees: Rt knee Flexion 100,  ext  -3 both taken in supine    Time 4   Period Weeks   Status On-going   PT SHORT TERM GOAL #3   Title able to amb without AD safely   Time 4   Period Weeks   Status Achieved           PT Long Term Goals - Nov 15, 2014 1315    PT LONG TERM GOAL #1   Title I with advanced HEP   Time 8   Period Weeks   Status On-going   PT LONG TERM GOAL #2   Title improved Rt knee ROM -2 to 118 degrees  As of 15-Nov-2022: Rt knee flexion 105,  extension -5   Time 8   Period Weeks   Status Partially Met   PT LONG TERM GOAL #3   Title able to walk for 30 minutes without increased Rt knee pain   Time 8   Period Weeks   Status Achieved   PT LONG TERM GOAL #4   Title able to climb stairs with a reciprocal gait   Time 8   Period Weeks   Status On-going   PT LONG TERM GOAL #5   Title demo 4+/5 BLE strength to improve function   Time 8   Period Weeks   Status On-going   PT LONG TERM GOAL #6   Title improve FOTO to 46% limited  As of November 15, 2022 scored 48% limitations   Time 8   Period Weeks   Status Partially Met               Plan - Nov 15, 2014 1127    Clinical Impression Statement Pt presents with less waddling gait. Pt is improving and progressing toward all LTG's   Pt will benefit from skilled therapeutic intervention in order to improve on the following deficits Abnormal gait;Decreased range of motion;Difficulty walking;Decreased activity tolerance;Pain;Decreased mobility;Decreased strength;Increased edema   Rehab Potential Excellent   PT Frequency 3x / week   PT Duration 8 weeks   PT Treatment/Interventions ADLs/Self Care Home Management;Cryotherapy;Electrical Stimulation;Ultrasound;Moist Heat;Stair training;Gait training;Therapeutic exercise;Balance training;Neuromuscular re-education;Patient/family education;Manual techniques;Scar mobilization;Passive range of motion;Taping;Vasopneumatic Device   PT Next Visit Plan Continue with strength, endurance, MT to release tightness on incision side   PT Home Exercise Plan knee theraband exercises   Consulted and Agree with Plan of Care Patient          G-Codes - 11-15-2014 1432    Functional Assessment Tool Used FOTO score is 48% limitation  goal is 46% limitation   Functional Limitation Mobility: Walking and moving around   Mobility: Walking and Moving Around Current Status (D1497) At least 40 percent but less than 60 percent impaired, limited or restricted   Mobility:  Walking and Moving Around Goal Status (W2637) At least 40 percent but  less than 60 percent impaired, limited or restricted      Problem List Patient Active Problem List   Diagnosis Date Noted  . Acute blood loss anemia 09/20/2014  . Primary osteoarthritis of right knee 09/14/2014  . Primary osteoarthritis of knee 09/14/2014  . Vitamin B 12 deficiency 07/29/2014  . Obesity (BMI 30-39.9) 12/08/2013  . Osteoarthritis, knee 05/30/2012  . Metabolic syndrome 03/15/1116  . DVT, HX OF 02/10/2010  . Vitamin D deficiency 08/25/2007  . Disorder of bone and cartilage 04/08/2007  . Hypothyroidism 11/18/2006  . ANEMIA-NOS 11/18/2006  . Essential hypertension 11/18/2006  . GERD 11/18/2006   Earlie Counts, PT 11/03/2014 2:42 PM   GRAY,CHERYL PTA 11/03/2014, 2:42 PM  [facility] [address] [place], [place], [address] Phone: [phone]   Fax:  [phone]   Physical Therapy Progress Note  Dates of Reporting Period: 10/12/2014 to 12/10/2014 Objective Reports of Subjective Statement:  Patient has increased stamina and standing endurance. Patients pain level is 1/10 and intermittent.  Patient is concerned about her waddling gait which is due right knee and hip weakness. Patient can stand 15 min and walk 30 minutes.   Objective Measurements: Right knee AROM: extension -4 degrees and flexion is 105 degrees. Right knee strength for flexion is 4/5 and extension is 4-/5.  Goal Update: Patient has met STG # 1 and 3. Met LTG # 3.  Patient has not met other goals due to decreased strength and ROM. Patient is unable to go up and down stairs with an alternate gait.  Plan: Right knee pain intermittently is 1/10.  Reason Skilled Services are Required:  Patient would benefit from physical therapy to improve her right knee ROM with manual skills and strengthen right knee so she is able to perform her daily tasks, walk, go up and down stairs  without difficulty.  Earlie Counts, PT 11/03/2014 2:42 PM

## 2014-11-05 ENCOUNTER — Ambulatory Visit: Payer: Medicare Other | Admitting: Physical Therapy

## 2014-11-05 ENCOUNTER — Encounter: Payer: Self-pay | Admitting: Physical Therapy

## 2014-11-05 DIAGNOSIS — R262 Difficulty in walking, not elsewhere classified: Secondary | ICD-10-CM | POA: Diagnosis not present

## 2014-11-05 DIAGNOSIS — R531 Weakness: Secondary | ICD-10-CM

## 2014-11-05 DIAGNOSIS — M25661 Stiffness of right knee, not elsewhere classified: Secondary | ICD-10-CM

## 2014-11-05 DIAGNOSIS — R609 Edema, unspecified: Secondary | ICD-10-CM | POA: Diagnosis not present

## 2014-11-05 NOTE — Therapy (Signed)
[name] [address] [place], [place], [address] Phone: [phone]   Fax:  [phone]  Physical Therapy Treatment  Patient Details  Name: Kimberly [name] MRN: [id number] Date of Birth: March 07, 1951 Referring Provider:  Eulas Post, MD  Encounter Date: 11/05/2014      PT End of Session - 11/05/14 1034    Activity Tolerance Patient tolerated treatment well   Behavior During Therapy [facility] for tasks assessed/performed      Past Medical History  Diagnosis Date  . Anemia 2005    hb 11.5  . Hypertension   . Hypothyroidism     off meds  . DVT (deep venous thrombosis)     2003, coumadin x 6 mon  . GERD (gastroesophageal reflux disease) 2001    no problem now  . Arthritis   . Osteopenia   . Vegetarian diet     eats fish and eggs; no meat    Past Surgical History  Procedure Laterality Date  . Oophorectomy unilateral    . Arthroscopy right knee  03/16/03  . Total knee arthroplasty Right 09/14/2014    Procedure: RIGHT TOTAL KNEE ARTHROPLASTY;  Surgeon: Garald Balding, MD;  Location: [place];  Service: Orthopedics;  Laterality: Right;    There were no vitals filed for this visit.  Visit Diagnosis:  Stiffness of knee joint, right  Difficulty walking  Edema  Generalized weakness      Subjective Assessment - 11/05/14 1014    Subjective Pt reports feeling better with Rt knee, but negotiating needs to step to gait pattern   Patient is accompained by: Family member   Limitations Standing;Walking   Currently in Pain? Yes   Pain Score 1    Pain Orientation Right   Pain Descriptors / Indicators Dull   Pain Type Surgical pain   Multiple Pain Sites No                         [facility] Adult PT Treatment/Exercise - 11/05/14 0001    Knee/Hip Exercises: Aerobic   Stationary Bike 11 min L1 full revolutions, small hip hike.   Tread Mill 1.17mh initially than incr to 1.8102m pt with improved step  length and equal weightbearing x 62m49m  Knee/Hip Exercises: Machines for Strengthening   Cybex Leg Press St 4  75# 3x10 B LE, 30# 3x10 Rt LE   Knee/Hip Exercises: Standing   Lateral Step Up Right;2 sets;10 reps;Hand Hold: 1   Forward Step Up Right;2 sets;10 reps;Hand Hold: 1;Step Height: 4"   Step Down Left;2 sets  for strength Rt LE   Other Standing Knee Exercises Bosu step up forward & lat 2 x10 with UE support   Vasopneumatic   Number Minutes Vasopneumatic  15 minutes   Vasopnuematic Location  Knee   Vasopneumatic Pressure Medium   Vasopneumatic Temperature  3 flakes  place towel in sleeve   Manual Therapy   Manual Therapy Soft tissue mobilization;Other (comment)  to Rt knee, incision side and distal quadriceps, hamstrings                  PT Short Term Goals - 11/01/14 1042    PT SHORT TERM GOAL #1   Title I with initial HEP   Time 4   Period Weeks   Status Achieved   PT SHORT TERM GOAL #2   Title improved Rt knee ROM -5 to 110  AROM in degrees: Rt knee  Flexion 100,  ext  -3 both taken in supine    Time 4   Period Weeks   Status On-going   PT SHORT TERM GOAL #3   Title able to amb without AD safely   Time 4   Period Weeks   Status Achieved           PT Long Term Goals - 11/03/14 1315    PT LONG TERM GOAL #1   Title I with advanced HEP   Time 8   Period Weeks   Status On-going   PT LONG TERM GOAL #2   Title improved Rt knee ROM -2 to 118 degrees  As of 6-22: Rt knee flexion 105, extension -5   Time 8   Period Weeks   Status Partially Met   PT LONG TERM GOAL #3   Title able to walk for 30 minutes without increased Rt knee pain   Time 8   Period Weeks   Status Achieved   PT LONG TERM GOAL #4   Title able to climb stairs with a reciprocal gait   Time 8   Period Weeks   Status On-going   PT LONG TERM GOAL #5   Title demo 4+/5 BLE strength to improve function   Time 8   Period Weeks   Status On-going   PT LONG TERM GOAL #6   Title  improve FOTO to 46% limited  As of 6/22 scored 48% limitations   Time 8   Period Weeks   Status Partially Met               Plan - 11/05/14 1035    Clinical Impression Statement Pt continues to improve with her gait pattern on level surface. Pt unable to negotiate stairs step-over step, due to functional wekaness and decreased ROM and control Rt knee.    Pt will benefit from skilled therapeutic intervention in order to improve on the following deficits Abnormal gait;Decreased range of motion;Difficulty walking;Decreased activity tolerance;Pain;Decreased mobility;Decreased strength;Increased edema   Rehab Potential Excellent   PT Frequency 3x / week   PT Next Visit Plan Continue with Treadmill to improve gait pattern, and practice strengthening on stairs /eccentric control   Consulted and Agree with Plan of Care Patient        Problem List Patient Active Problem List   Diagnosis Date Noted  . Acute blood loss anemia 09/20/2014  . Primary osteoarthritis of right knee 09/14/2014  . Primary osteoarthritis of knee 09/14/2014  . Vitamin B 12 deficiency 07/29/2014  . Obesity (BMI 30-39.9) 12/08/2013  . Osteoarthritis, knee 05/30/2012  . Metabolic syndrome 31/43/8887  . DVT, HX OF 02/10/2010  . Vitamin D deficiency 08/25/2007  . Disorder of bone and cartilage 04/08/2007  . Hypothyroidism 11/18/2006  . ANEMIA-NOS 11/18/2006  . Essential hypertension 11/18/2006  . GERD 11/18/2006    NAUMANN-HOUEGNIFIO,Magnus Crescenzo PTA 11/05/2014, 11:04 AM  [facility] [address] [place], [place], [address] Phone: [phone]   Fax:  [phone]

## 2014-11-08 ENCOUNTER — Encounter: Payer: Self-pay | Admitting: Physical Therapy

## 2014-11-08 ENCOUNTER — Ambulatory Visit: Payer: Medicare Other | Admitting: Physical Therapy

## 2014-11-08 DIAGNOSIS — R262 Difficulty in walking, not elsewhere classified: Secondary | ICD-10-CM

## 2014-11-08 DIAGNOSIS — R609 Edema, unspecified: Secondary | ICD-10-CM

## 2014-11-08 DIAGNOSIS — R531 Weakness: Secondary | ICD-10-CM

## 2014-11-08 DIAGNOSIS — M25661 Stiffness of right knee, not elsewhere classified: Secondary | ICD-10-CM

## 2014-11-08 NOTE — Therapy (Signed)
[facility] [address] [place], [place], [address] Phone: [phone]   Fax:  [phone]  Physical Therapy Treatment  Patient Details  Name: Kimberly [name] MRN: [id number] Date of Birth: [birth year] Referring Provider:  Eulas Post, MD  Encounter Date: 11/08/2014      PT End of Session - 11/08/14 1006    Visit Number 12   Number of Visits 20   Date for PT Re-Evaluation 12/10/14   PT Start Time 0930   PT Stop Time 1025   PT Time Calculation (min) 55 min   Activity Tolerance Patient tolerated treatment well   Behavior During Therapy [facility] for tasks assessed/performed      Past Medical History  Diagnosis Date  . Anemia 2005    hb 11.5  . Hypertension   . Hypothyroidism     off meds  . DVT (deep venous thrombosis)     2003, coumadin x 6 mon  . GERD (gastroesophageal reflux disease) 2001    no problem now  . Arthritis   . Osteopenia   . Vegetarian diet     eats fish and eggs; no meat    Past Surgical History  Procedure Laterality Date  . Oophorectomy unilateral    . Arthroscopy right knee  03/16/03  . Total knee arthroplasty Right 09/14/2014    Procedure: RIGHT TOTAL KNEE ARTHROPLASTY;  Surgeon: Garald Balding, MD;  Location: [place];  Service: Orthopedics;  Laterality: Right;    There were no vitals filed for this visit.  Visit Diagnosis:  Stiffness of knee joint, right  Generalized weakness  Edema  Difficulty walking      Subjective Assessment - 11/08/14 0936    Subjective Fell asleep on love seat, woke up with pain in right knee. Was initially scared to bend it, but know it feels better.    Currently in Pain? Yes   Pain Score 2    Pain Location Knee  The LT knee is hurting today, not right.    Pain Orientation Left   Pain Descriptors / Indicators Sore   Aggravating Factors  Bending, not bending    Pain Relieving Factors rest, meds   Multiple Pain Sites No                          [facility] Adult PT Treatment/Exercise - 11/08/14 0001    Knee/Hip Exercises: Aerobic   Stationary Bike L2 x 10 min   Knee/Hip Exercises: Machines for Strengthening   Cybex Leg Press St 4 Bil 75# 10x 80# 2x10, RTLE 35#    Knee/Hip Exercises: Standing   Step Down Right;2 sets;10 reps;Hand Hold: 2;Step Height: 6"   Rebounder High knee marching x 1 min   Knee/Hip Exercises: Seated   Long Arc Quad Strengthening;Right;3 sets;10 reps;Weights  VC to DF ankle and feel knee stretch   Long Arc Quad Weight 2 lbs.   Vasopneumatic   Number Minutes Vasopneumatic  15 minutes   Vasopnuematic Location  Knee   Vasopneumatic Pressure Medium   Vasopneumatic Temperature  3 flakes  place towel in sleeve   Manual Therapy   Manual Therapy Soft tissue mobilization;Other (comment)  to Rt knee, incision side and distal quadriceps, hamstrings                  PT Short Term Goals - 11/08/14 0948    PT SHORT TERM GOAL #1   Title I with initial HEP  Time 4   Period Weeks   Status Achieved   PT SHORT TERM GOAL #2   Title improved Rt knee ROM -5 to 110   Time 4   Period Weeks   Status On-going  Will measure end of week.    PT SHORT TERM GOAL #3   Title able to amb without AD safely   Time 4   Period Weeks   Status Achieved           PT Long Term Goals - 11/08/14 0950    PT LONG TERM GOAL #3   Title able to walk for 30 minutes without increased Rt knee pain   Time 8   Period Weeks   Status Achieved               Plan - 11/08/14 1007    Clinical Impression Statement Pt had one episode of increased pain after falling asleep on a small couch. She was worried so admittedly did not bend knee and protected it for around 12 hrs. Today RT knee is fine.    Pt will benefit from skilled therapeutic intervention in order to improve on the following deficits Abnormal gait;Decreased range of motion;Difficulty walking;Decreased activity  tolerance;Pain;Decreased mobility;Decreased strength;Increased edema   Rehab Potential Excellent   PT Frequency 3x / week   PT Duration 8 weeks   PT Treatment/Interventions ADLs/Self Care Home Management;Cryotherapy;Electrical Stimulation;Ultrasound;Moist Heat;Stair training;Gait training;Therapeutic exercise;Balance training;Neuromuscular re-education;Patient/family education;Manual techniques;Scar mobilization;Passive range of motion;Taping;Vasopneumatic Device   PT Next Visit Plan Quad strength   Consulted and Agree with Plan of Care Patient        Problem List Patient Active Problem List   Diagnosis Date Noted  . Acute blood loss anemia 09/20/2014  . Primary osteoarthritis of right knee 09/14/2014  . Primary osteoarthritis of knee 09/14/2014  . Vitamin B 12 deficiency 07/29/2014  . Obesity (BMI 30-39.9) 12/08/2013  . Osteoarthritis, knee 05/30/2012  . Metabolic syndrome 22/06/5425  . DVT, HX OF 02/10/2010  . Vitamin D deficiency 08/25/2007  . Disorder of bone and cartilage 04/08/2007  . Hypothyroidism 11/18/2006  . ANEMIA-NOS 11/18/2006  . Essential hypertension 11/18/2006  . GERD 11/18/2006    [name], PTA 11/08/2014, 10:10 AM  [facility] [address] [place], [place], [address] Phone: [phone]   Fax:  [phone]     Pt has MD appt this Friday.

## 2014-11-10 ENCOUNTER — Ambulatory Visit: Payer: Medicare Other | Admitting: Physical Therapy

## 2014-11-10 ENCOUNTER — Encounter: Payer: Self-pay | Admitting: Physical Therapy

## 2014-11-10 DIAGNOSIS — R609 Edema, unspecified: Secondary | ICD-10-CM

## 2014-11-10 DIAGNOSIS — R262 Difficulty in walking, not elsewhere classified: Secondary | ICD-10-CM | POA: Diagnosis not present

## 2014-11-10 DIAGNOSIS — R531 Weakness: Secondary | ICD-10-CM | POA: Diagnosis not present

## 2014-11-10 DIAGNOSIS — M25661 Stiffness of right knee, not elsewhere classified: Secondary | ICD-10-CM | POA: Diagnosis not present

## 2014-11-10 NOTE — Therapy (Signed)
[facility] [address], [place], [place], [address] Phone: [phone]   Fax:  [phone]  Physical Therapy Treatment  Patient Details  Name: Kimberly [name] MRN: [id number] Date of Birth: 1950-06-25 Referring Provider:  Eulas Post, MD  Encounter Date: 11/10/2014      PT End of Session - 11/10/14 1004    Visit Number 13   Number of Visits 20   Date for PT Re-Evaluation 12/10/14   PT Start Time 0922   PT Stop Time 1020   PT Time Calculation (min) 58 min   Activity Tolerance Patient tolerated treatment well   Behavior During Therapy [facility] for tasks assessed/performed      Past Medical History  Diagnosis Date  . Anemia 2005    hb 11.5  . Hypertension   . Hypothyroidism     off meds  . DVT (deep venous thrombosis)     2003, coumadin x 6 mon  . GERD (gastroesophageal reflux disease) 2001    no problem now  . Arthritis   . Osteopenia   . Vegetarian diet     eats fish and eggs; no meat    Past Surgical History  Procedure Laterality Date  . Oophorectomy unilateral    . Arthroscopy right knee  03/16/03  . Total knee arthroplasty Right 09/14/2014    Procedure: RIGHT TOTAL KNEE ARTHROPLASTY;  Surgeon: Garald Balding, MD;  Location: [place];  Service: Orthopedics;  Laterality: Right;    There were no vitals filed for this visit.  Visit Diagnosis:  Generalized weakness  Stiffness of knee joint, right  Edema  Difficulty walking      Subjective Assessment - 11/10/14 0928    Subjective Feels better today. Sometimes feels her Lt knee is more painful.  Riding bike at home 35 min.    Currently in Pain? Yes   Pain Score 1    Pain Location Knee   Pain Orientation Right   Pain Descriptors / Indicators Dull   Multiple Pain Sites No            [facility] PT Assessment - 11/10/14 0001    AROM   Right Knee Extension 4   Right Knee Flexion 105                     [facility] Adult PT  Treatment/Exercise - 11/10/14 0001    Knee/Hip Exercises: Stretches   Active Hamstring Stretch Right;3 reps;20 seconds   Knee/Hip Exercises: Aerobic   Stationary Bike L2 x 10 min   Elliptical L1 x 3 min   Knee/Hip Exercises: Machines for Strengthening   Cybex Leg Press St 4 80# 30x, RTLE 35#    Knee/Hip Exercises: Standing   Forward Step Up Right;20 reps;Hand Hold: 0;Step Height: 6"   Rebounder High knee marching x 2 min   Knee/Hip Exercises: Seated   Long Arc Quad Strengthening;Right;3 sets;10 reps   Long Arc Quad Weight 3 lbs.   Vasopneumatic   Number Minutes Vasopneumatic  15 minutes   Vasopnuematic Location  Knee   Vasopneumatic Pressure Medium   Vasopneumatic Temperature  3 flakes  place towel in sleeve                  PT Short Term Goals - 11/08/14 0948    PT SHORT TERM GOAL #1   Title I with initial HEP   Time 4   Period Weeks   Status Achieved   PT SHORT  TERM GOAL #2   Title improved Rt knee ROM -5 to 110   Time 4   Period Weeks   Status On-going  Will measure end of week.    PT SHORT TERM GOAL #3   Title able to amb without AD safely   Time 4   Period Weeks   Status Achieved           PT Long Term Goals - 11/10/14 1006    PT LONG TERM GOAL #1   Title I with advanced HEP   Period Weeks   Status On-going   PT LONG TERM GOAL #2   Title improved Rt knee ROM -2 to 118 degrees   Time 8   Period Weeks   Status --  4-105   PT LONG TERM GOAL #3   Title able to walk for 30 minutes without increased Rt knee pain   Time 8   Period Weeks   Status Achieved               Plan - 11/10/14 1004    Clinical Impression Statement Pain almost nonexistent, greater complaints in LT knee at this time. Swelling coming down, ROM steady: see measurements.         Problem List Patient Active Problem List   Diagnosis Date Noted  . Acute blood loss anemia 09/20/2014  . Primary osteoarthritis of right knee 09/14/2014  . Primary osteoarthritis of  knee 09/14/2014  . Vitamin B 12 deficiency 07/29/2014  . Obesity (BMI 30-39.9) 12/08/2013  . Osteoarthritis, knee 05/30/2012  . Metabolic syndrome 86/57/8469  . DVT, HX OF 02/10/2010  . Vitamin D deficiency 08/25/2007  . Disorder of bone and cartilage 04/08/2007  . Hypothyroidism 11/18/2006  . ANEMIA-NOS 11/18/2006  . Essential hypertension 11/18/2006  . GERD 11/18/2006    Mariaclara Spear, PTA 11/10/2014, 10:07 AM   [facility] [address], [place], [place], [address] Phone: [phone]   Fax:  [phone]

## 2014-11-12 ENCOUNTER — Encounter: Payer: Self-pay | Admitting: Physical Therapy

## 2014-11-12 ENCOUNTER — Ambulatory Visit: Payer: Medicare Other | Attending: Orthopaedic Surgery | Admitting: Physical Therapy

## 2014-11-12 DIAGNOSIS — M25661 Stiffness of right knee, not elsewhere classified: Secondary | ICD-10-CM | POA: Diagnosis not present

## 2014-11-12 DIAGNOSIS — R262 Difficulty in walking, not elsewhere classified: Secondary | ICD-10-CM | POA: Diagnosis not present

## 2014-11-12 DIAGNOSIS — R531 Weakness: Secondary | ICD-10-CM | POA: Insufficient documentation

## 2014-11-12 DIAGNOSIS — R609 Edema, unspecified: Secondary | ICD-10-CM | POA: Diagnosis not present

## 2014-11-12 NOTE — Therapy (Signed)
[facility] [address] [place], [place], [address] Phone: [phone]   Fax:  [phone]  Physical Therapy Treatment  Patient Details  Name: Kimberly [name] MRN: [id number] Date of Birth: [birth year] Referring Provider:  Eulas Post, MD  Encounter Date: 11/12/2014      PT End of Session - 11/12/14 1123    Visit Number 14   Number of Visits 20   Date for PT Re-Evaluation 12/10/14   PT Start Time 1053   PT Stop Time 1145   PT Time Calculation (min) 52 min   Activity Tolerance Patient tolerated treatment well   Behavior During Therapy [facility] for tasks assessed/performed      Past Medical History  Diagnosis Date  . Anemia 2005    hb 11.5  . Hypertension   . Hypothyroidism     off meds  . DVT (deep venous thrombosis)     2003, coumadin x 6 mon  . GERD (gastroesophageal reflux disease) 2001    no problem now  . Arthritis   . Osteopenia   . Vegetarian diet     eats fish and eggs; no meat    Past Surgical History  Procedure Laterality Date  . Oophorectomy unilateral    . Arthroscopy right knee  03/16/03  . Total knee arthroplasty Right 09/14/2014    Procedure: RIGHT TOTAL KNEE ARTHROPLASTY;  Surgeon: Garald Balding, MD;  Location: [place];  Service: Orthopedics;  Laterality: Right;    There were no vitals filed for this visit.  Visit Diagnosis:  Generalized weakness  Stiffness of knee joint, right  Edema  Difficulty walking      Subjective Assessment - 11/12/14 1107    Subjective No complains of pain sometimes stiffness after prolonged positions.    Patient is accompained by: --  husband   Limitations Standing;Walking   Currently in Pain? No/denies   Multiple Pain Sites No                         [facility] Adult PT Treatment/Exercise - 11/12/14 0001    Knee/Hip Exercises: Stretches   Active Hamstring Stretch --   Knee/Hip Exercises: Aerobic   Stationary Bike L2 x 13 min   Elliptical L1 x 4 min   Knee/Hip Exercises: Machines for Strengthening   Cybex Leg Press St 4 85# B LE 30x, Rt LE 40# 3 x 10   Knee/Hip Exercises: Standing   Lateral Step Up Right;2 sets;10 reps;Hand Hold: 1   Forward Step Up Right;20 reps;Hand Hold: 0;Step Height: 6"  fingertip support   Step Down Right;2 sets;10 reps;Step Height: 6";Hand Hold: 2   Rebounder weightshift 3 directions x 1 min each   Vasopneumatic   Number Minutes Vasopneumatic  15 minutes   Vasopnuematic Location  Knee   Vasopneumatic Pressure Medium   Vasopneumatic Temperature  3 flakes  place towel in sleeve   Manual Therapy   Manual Therapy Soft tissue mobilization;Other (comment)                  PT Short Term Goals - 11/08/14 0948    PT SHORT TERM GOAL #1   Title I with initial HEP   Time 4   Period Weeks   Status Achieved   PT SHORT TERM GOAL #2   Title improved Rt knee ROM -5 to 110   Time 4   Period Weeks   Status On-going  Will measure end of  week.    PT SHORT TERM GOAL #3   Title able to amb without AD safely   Time 4   Period Weeks   Status Achieved           PT Long Term Goals - 11/10/14 1006    PT LONG TERM GOAL #1   Title I with advanced HEP   Period Weeks   Status On-going   PT LONG TERM GOAL #2   Title improved Rt knee ROM -2 to 118 degrees   Time 8   Period Weeks   Status --  4-105   PT LONG TERM GOAL #3   Title able to walk for 30 minutes without increased Rt knee pain   Time 8   Period Weeks   Status Achieved               Plan - 11/12/14 1124    Clinical Impression Statement Pt saw MD yesterday he is pleased with outcome so far. Pt is improving in all areas, but still presents with waddeling giat.   Pt will benefit from skilled therapeutic intervention in order to improve on the following deficits Abnormal gait;Decreased range of motion;Difficulty walking;Decreased activity tolerance;Pain;Decreased mobility;Decreased strength;Increased edema   Rehab  Potential Excellent   PT Frequency 3x / week   PT Duration 8 weeks   PT Treatment/Interventions ADLs/Self Care Home Management;Cryotherapy;Electrical Stimulation;Ultrasound;Moist Heat;Stair training;Gait training;Therapeutic exercise;Balance training;Neuromuscular re-education;Patient/family education;Manual techniques;Scar mobilization;Passive range of motion;Taping;Vasopneumatic Device   PT Next Visit Plan Continue with quadriceps strength, endurance, Manual therapy PRN, Vaso        Problem List Patient Active Problem List   Diagnosis Date Noted  . Acute blood loss anemia 09/20/2014  . Primary osteoarthritis of right knee 09/14/2014  . Primary osteoarthritis of knee 09/14/2014  . Vitamin B 12 deficiency 07/29/2014  . Obesity (BMI 30-39.9) 12/08/2013  . Osteoarthritis, knee 05/30/2012  . Metabolic syndrome 24/82/5003  . DVT, HX OF 02/10/2010  . Vitamin D deficiency 08/25/2007  . Disorder of bone and cartilage 04/08/2007  . Hypothyroidism 11/18/2006  . ANEMIA-NOS 11/18/2006  . Essential hypertension 11/18/2006  . GERD 11/18/2006    NAUMANN-HOUEGNIFIO,Bunnie Lederman PTA 11/12/2014, 11:48 AM  [facility] [address] [place], [place], [address] Phone: [phone]   Fax:  [phone]

## 2014-11-16 ENCOUNTER — Ambulatory Visit: Payer: Medicare Other | Admitting: Physical Therapy

## 2014-11-16 ENCOUNTER — Encounter: Payer: Self-pay | Admitting: Physical Therapy

## 2014-11-16 DIAGNOSIS — M25661 Stiffness of right knee, not elsewhere classified: Secondary | ICD-10-CM | POA: Diagnosis not present

## 2014-11-16 DIAGNOSIS — R531 Weakness: Secondary | ICD-10-CM

## 2014-11-16 DIAGNOSIS — R609 Edema, unspecified: Secondary | ICD-10-CM

## 2014-11-16 DIAGNOSIS — R262 Difficulty in walking, not elsewhere classified: Secondary | ICD-10-CM | POA: Diagnosis not present

## 2014-11-16 NOTE — Therapy (Signed)
[facility] [address], [place], [place], [address] Phone: [phone]   Fax:  [phone]  Physical Therapy Treatment  Patient Details  Name: Atheena Spano MRN: [id number] Date of Birth: March 14, 1951 Referring Provider:  Eulas Post, MD  Encounter Date: 11/16/2014      PT End of Session - 11/16/14 0951    Visit Number 15   Number of Visits 20   Date for PT Re-Evaluation 12/10/14   PT Start Time 0915   PT Stop Time 1020   PT Time Calculation (min) 65 min   Activity Tolerance Patient tolerated treatment well   Behavior During Therapy [facility] for tasks assessed/performed      Past Medical History  Diagnosis Date  . Anemia 2005    hb 11.5  . Hypertension   . Hypothyroidism     off meds  . DVT (deep venous thrombosis)     2003, coumadin x 6 mon  . GERD (gastroesophageal reflux disease) 2001    no problem now  . Arthritis   . Osteopenia   . Vegetarian diet     eats fish and eggs; no meat    Past Surgical History  Procedure Laterality Date  . Oophorectomy unilateral    . Arthroscopy right knee  03/16/03  . Total knee arthroplasty Right 09/14/2014    Procedure: RIGHT TOTAL KNEE ARTHROPLASTY;  Surgeon: Garald Balding, MD;  Location: [place];  Service: Orthopedics;  Laterality: Right;    There were no vitals filed for this visit.  Visit Diagnosis:  Generalized weakness  Stiffness of knee joint, right  Edema  Difficulty walking      Subjective Assessment - 11/16/14 0937    Subjective Only discomfort in the morning rated as 1/10, described as tingling, needles   Currently in Pain? No/denies  only discomfort in the morning   Multiple Pain Sites No                         [facility] Adult PT Treatment/Exercise - 11/16/14 0001    Knee/Hip Exercises: Aerobic   Stationary Bike L2 x 23 min, pt arrived early   Elliptical L1 x 4 min   Knee/Hip Exercises: Machines for Strengthening   Cybex  Leg Press St 4 85# B LE 30x, Rt LE 40# 3 x 10  icrease weight to 90# for B LE   Knee/Hip Exercises: Standing   Lateral Step Up Right;2 sets;10 reps;Hand Hold: 1  sometimes needs 2 Hand hold assist   Forward Step Up Right;20 reps;Hand Hold: 0;Step Height: 6"  fingertip support   Step Down Right;2 sets;10 reps;Step Height: 6";Hand Hold: 2   Rebounder High Knee marching   Vasopneumatic   Number Minutes Vasopneumatic  15 minutes   Vasopnuematic Location  Knee   Vasopneumatic Pressure Medium   Vasopneumatic Temperature  3 flakes  place towel into sleeve   Manual Therapy   Manual Therapy Soft tissue mobilization;Other (comment)  to med knee area to release adhaesions                  PT Short Term Goals - 11/16/14 0923    PT SHORT TERM GOAL #1   Title I with initial HEP   Time 4   Period Weeks   Status Achieved   PT SHORT TERM GOAL #2   Title improved Rt knee ROM -5 to 110  As of 11/16/14  rt knee flex 105  Time 4   Period Weeks   Status On-going   PT SHORT TERM GOAL #3   Title able to amb without AD safely   Time 4   Period Weeks   Status Achieved           PT Long Term Goals - 11/16/14 7893    PT LONG TERM GOAL #1   Title I with advanced HEP   Time 8   Period Weeks   Status On-going   PT LONG TERM GOAL #2   Title improved Rt knee ROM -2 to 118 degrees  As of 11/16/14 Rt knee flexion 105degrees   Time 8   Period Weeks   Status On-going   PT LONG TERM GOAL #3   Title able to walk for 30 minutes without increased Rt knee pain   Time 8   Period Weeks   Status Achieved   PT LONG TERM GOAL #4   Title able to climb stairs with a reciprocal gait   Time 8   Period Weeks   Status On-going   PT LONG TERM GOAL #5   Title demo 4+/5 BLE strength to improve function  As of 11/16/14 Rt knee flex / ext 4/5   Time 8   Period Weeks   Status Partially Met   PT LONG TERM GOAL #6   Title improve FOTO to 46% limited   Time 8   Period Weeks   Status Partially Met                Plan - 11/16/14 0952    Clinical Impression Statement Pt shows weakness with strength exercises at stairs, still need UE support with side stepping up and tapping down. Pt at times with waddeling giat. However, continues to show improvement with strength and  endurance   Rehab Potential Excellent   PT Frequency 3x / week   PT Duration 8 weeks   PT Treatment/Interventions ADLs/Self Care Home Management;Cryotherapy;Electrical Stimulation;Ultrasound;Moist Heat;Stair training;Gait training;Therapeutic exercise;Balance training;Neuromuscular re-education;Patient/family education;Manual techniques;Scar mobilization;Passive range of motion;Taping;Vasopneumatic Device   PT Next Visit Plan Continue with quadriceps strength, endurance, Manual therapy PRN, Vaso   Consulted and Agree with Plan of Care Patient        Problem List Patient Active Problem List   Diagnosis Date Noted  . Acute blood loss anemia 09/20/2014  . Primary osteoarthritis of right knee 09/14/2014  . Primary osteoarthritis of knee 09/14/2014  . Vitamin B 12 deficiency 07/29/2014  . Obesity (BMI 30-39.9) 12/08/2013  . Osteoarthritis, knee 05/30/2012  . Metabolic syndrome 81/05/7508  . DVT, HX OF 02/10/2010  . Vitamin D deficiency 08/25/2007  . Disorder of bone and cartilage 04/08/2007  . Hypothyroidism 11/18/2006  . ANEMIA-NOS 11/18/2006  . Essential hypertension 11/18/2006  . GERD 11/18/2006    NAUMANN-HOUEGNIFIO,Eulalio Reamy PTA 11/16/2014, 10:16 AM  [facility] [address], [place], [place], [address] Phone: [phone]   Fax:  [phone]

## 2014-11-17 ENCOUNTER — Ambulatory Visit: Payer: Medicare Other

## 2014-11-17 DIAGNOSIS — M25661 Stiffness of right knee, not elsewhere classified: Secondary | ICD-10-CM | POA: Diagnosis not present

## 2014-11-17 DIAGNOSIS — R531 Weakness: Secondary | ICD-10-CM

## 2014-11-17 DIAGNOSIS — R262 Difficulty in walking, not elsewhere classified: Secondary | ICD-10-CM | POA: Diagnosis not present

## 2014-11-17 DIAGNOSIS — R609 Edema, unspecified: Secondary | ICD-10-CM

## 2014-11-17 NOTE — Therapy (Signed)
[facility] [address] [place], [place], [address] Phone: [phone]   Fax:  [phone]  Physical Therapy Treatment  Patient Details  Name: Kimberly [name] MRN: [id number] Date of Birth: Feb 13, 1951 Referring Provider:  Eulas Post, MD  Encounter Date: 11/17/2014      PT End of Session - 11/17/14 1007    Visit Number 16   Number of Visits 20   Date for PT Re-Evaluation 12/10/14   PT Start Time 0917   PT Stop Time 1025   PT Time Calculation (min) 68 min   Activity Tolerance Patient tolerated treatment well   Behavior During Therapy [facility] for tasks assessed/performed      Past Medical History  Diagnosis Date  . Anemia 2005    hb 11.5  . Hypertension   . Hypothyroidism     off meds  . DVT (deep venous thrombosis)     2003, coumadin x 6 mon  . GERD (gastroesophageal reflux disease) 2001    no problem now  . Arthritis   . Osteopenia   . Vegetarian diet     eats fish and eggs; no meat    Past Surgical History  Procedure Laterality Date  . Oophorectomy unilateral    . Arthroscopy right knee  03/16/03  . Total knee arthroplasty Right 09/14/2014    Procedure: RIGHT TOTAL KNEE ARTHROPLASTY;  Surgeon: Garald Balding, MD;  Location: [place];  Service: Orthopedics;  Laterality: Right;    There were no vitals filed for this visit.  Visit Diagnosis:  Generalized weakness  Stiffness of knee joint, right  Edema  Difficulty walking      Subjective Assessment - 11/17/14 0935    Subjective No pain today.  Some pain yesterday.   Currently in Pain? No/denies                         [facility] Adult PT Treatment/Exercise - 11/17/14 0001    Knee/Hip Exercises: Aerobic   Stationary Bike L2 x 15 minutes  pt arrived early   Elliptical L1 x 5 min   Knee/Hip Exercises: Machines for Strengthening   Cybex Leg Press St 4 85# B LE 30x, Rt LE 40# 3 x 10  icrease weight to 90# for B LE   Knee/Hip  Exercises: Standing   Lateral Step Up Right;2 sets;10 reps;Hand Hold: 1  sometimes needs 2 Hand hold assist   Forward Step Up Right;20 reps;Hand Hold: 0;Step Height: 6"  fingertip support   Step Down Right;2 sets;10 reps;Step Height: 6";Hand Hold: 2   Walking with Sports Cord 20# 3 ways, 15 # with Lt sidestepping x 10 each   Vasopneumatic   Number Minutes Vasopneumatic  15 minutes   Vasopnuematic Location  Knee   Vasopneumatic Pressure Medium   Vasopneumatic Temperature  3 flakes  place towel into sleeve                  PT Short Term Goals - 11/16/14 3976    PT SHORT TERM GOAL #1   Title I with initial HEP   Time 4   Period Weeks   Status Achieved   PT SHORT TERM GOAL #2   Title improved Rt knee ROM -5 to 110  As of 11/16/14  rt knee flex 105    Time 4   Period Weeks   Status On-going   PT SHORT TERM GOAL #3   Title able to amb  without AD safely   Time 4   Period Weeks   Status Achieved           PT Long Term Goals - 11/16/14 0959    PT LONG TERM GOAL #1   Title I with advanced HEP   Time 8   Period Weeks   Status On-going   PT LONG TERM GOAL #2   Title improved Rt knee ROM -2 to 118 degrees  As of 11/16/14 Rt knee flexion 105degrees   Time 8   Period Weeks   Status On-going   PT LONG TERM GOAL #3   Title able to walk for 30 minutes without increased Rt knee pain   Time 8   Period Weeks   Status Achieved   PT LONG TERM GOAL #4   Title able to climb stairs with a reciprocal gait   Time 8   Period Weeks   Status On-going   PT LONG TERM GOAL #5   Title demo 4+/5 BLE strength to improve function  As of 11/16/14 Rt knee flex / ext 4/5   Time 8   Period Weeks   Status Partially Met   PT LONG TERM GOAL #6   Title improve FOTO to 46% limited   Time 8   Period Weeks   Status Partially Met               Plan - 11/17/14 1000    Clinical Impression Statement Pt continues to require UE support with step-up/down activity.  Rt knee instabilty  with eccentric control against weight with resisted walking.  Pt limited to 5 minutes on the elliptical due to endurance deficits but improved time today.  Pt will benefit from skilled PT to imrpove strength, gait, and endurance.     Pt will benefit from skilled therapeutic intervention in order to improve on the following deficits Abnormal gait;Decreased range of motion;Difficulty walking;Decreased activity tolerance;Pain;Decreased mobility;Decreased strength;Increased edema   Rehab Potential Excellent   PT Frequency 3x / week   PT Duration 8 weeks   PT Treatment/Interventions ADLs/Self Care Home Management;Cryotherapy;Electrical Stimulation;Ultrasound;Moist Heat;Stair training;Gait training;Therapeutic exercise;Balance training;Neuromuscular re-education;Patient/family education;Manual techniques;Scar mobilization;Passive range of motion;Taping;Vasopneumatic Device   PT Next Visit Plan strength, endurance, proprioception, edema management as needed.     Consulted and Agree with Plan of Care Patient        Problem List Patient Active Problem List   Diagnosis Date Noted  . Acute blood loss anemia 09/20/2014  . Primary osteoarthritis of right knee 09/14/2014  . Primary osteoarthritis of knee 09/14/2014  . Vitamin B 12 deficiency 07/29/2014  . Obesity (BMI 30-39.9) 12/08/2013  . Osteoarthritis, knee 05/30/2012  . Metabolic syndrome 57/84/6962  . DVT, HX OF 02/10/2010  . Vitamin D deficiency 08/25/2007  . Disorder of bone and cartilage 04/08/2007  . Hypothyroidism 11/18/2006  . ANEMIA-NOS 11/18/2006  . Essential hypertension 11/18/2006  . GERD 11/18/2006    Vail Basista , PT  11/17/2014, 10:19 AM  [facility] [address] [place], [place], [address] Phone: [phone]   Fax:  [phone]

## 2014-11-18 ENCOUNTER — Ambulatory Visit: Payer: Medicare Other

## 2014-11-18 DIAGNOSIS — M25661 Stiffness of right knee, not elsewhere classified: Secondary | ICD-10-CM | POA: Diagnosis not present

## 2014-11-18 DIAGNOSIS — R609 Edema, unspecified: Secondary | ICD-10-CM

## 2014-11-18 DIAGNOSIS — R531 Weakness: Secondary | ICD-10-CM | POA: Diagnosis not present

## 2014-11-18 DIAGNOSIS — R262 Difficulty in walking, not elsewhere classified: Secondary | ICD-10-CM | POA: Diagnosis not present

## 2014-11-18 NOTE — Therapy (Signed)
[facility] [address] [place], [place], [address] Phone: [phone]   Fax:  [phone]  Physical Therapy Treatment  Patient Details  Name: Kimberly [name] MRN: [id number] Date of Birth: 07-Feb-1951 Referring Provider:  Eulas Post, MD  Encounter Date: 11/18/2014      PT End of Session - 11/18/14 1012    Visit Number 17   Number of Visits 20  Medicare   Date for PT Re-Evaluation 12/10/14   PT Start Time 0921   PT Stop Time 1023   PT Time Calculation (min) 62 min   Activity Tolerance Patient tolerated treatment well   Behavior During Therapy [facility] for tasks assessed/performed      Past Medical History  Diagnosis Date  . Anemia 2005    hb 11.5  . Hypertension   . Hypothyroidism     off meds  . DVT (deep venous thrombosis)     2003, coumadin x 6 mon  . GERD (gastroesophageal reflux disease) 2001    no problem now  . Arthritis   . Osteopenia   . Vegetarian diet     eats fish and eggs; no meat    Past Surgical History  Procedure Laterality Date  . Oophorectomy unilateral    . Arthroscopy right knee  03/16/03  . Total knee arthroplasty Right 09/14/2014    Procedure: RIGHT TOTAL KNEE ARTHROPLASTY;  Surgeon: Garald Balding, MD;  Location: [place];  Service: Orthopedics;  Laterality: Right;    There were no vitals filed for this visit.  Visit Diagnosis:  Generalized weakness  Stiffness of knee joint, right  Edema      Subjective Assessment - 11/18/14 0930    Subjective No pain.  Went down steps in her garage today with step-over-step gait.     Currently in Pain? No/denies  Knee feels stiff, no pain                         [facility] Adult PT Treatment/Exercise - 11/18/14 0001    Knee/Hip Exercises: Aerobic   Stationary Bike L2 x 15 minutes  pt arrived early   Elliptical L1 x 5 min   Knee/Hip Exercises: Machines for Strengthening   Cybex Leg Press St 4 85# B LE 30x, Rt LE 40#  3 x 10   Knee/Hip Exercises: Standing   Lateral Step Up Right;2 sets;10 reps;Hand Hold: 1  sometimes needs 2 Hand hold assist   Forward Step Up Right;20 reps;Hand Hold: 0;Step Height: 8"  fingertip support   Step Down Right;2 sets;10 reps;Step Height: 6";Hand Hold: 2   Walking with Sports Cord 20# 3 ways, 15 # with Lt sidestepping x 10 each   Vasopneumatic   Number Minutes Vasopneumatic  15 minutes   Vasopnuematic Location  Knee   Vasopneumatic Pressure Medium   Vasopneumatic Temperature  3 flakes                  PT Short Term Goals - 11/16/14 6389    PT SHORT TERM GOAL #1   Title I with initial HEP   Time 4   Period Weeks   Status Achieved   PT SHORT TERM GOAL #2   Title improved Rt knee ROM -5 to 110  As of 11/16/14  rt knee flex 105    Time 4   Period Weeks   Status On-going   PT SHORT TERM GOAL #3   Title able to amb  without AD safely   Time 4   Period Weeks   Status Achieved           PT Long Term Goals - 11/16/14 0959    PT LONG TERM GOAL #1   Title I with advanced HEP   Time 8   Period Weeks   Status On-going   PT LONG TERM GOAL #2   Title improved Rt knee ROM -2 to 118 degrees  As of 11/16/14 Rt knee flexion 105degrees   Time 8   Period Weeks   Status On-going   PT LONG TERM GOAL #3   Title able to walk for 30 minutes without increased Rt knee pain   Time 8   Period Weeks   Status Achieved   PT LONG TERM GOAL #4   Title able to climb stairs with a reciprocal gait   Time 8   Period Weeks   Status On-going   PT LONG TERM GOAL #5   Title demo 4+/5 BLE strength to improve function  As of 11/16/14 Rt knee flex / ext 4/5   Time 8   Period Weeks   Status Partially Met   PT LONG TERM GOAL #6   Title improve FOTO to 46% limited   Time 8   Period Weeks   Status Partially Met               Plan - 11/18/14 0944    Clinical Impression Statement Pt tolerated increased height step with forward step-ups today with 1 UE support.  Pt with  continued Rt knee weakness and instability with high level strength activities.  Pt will benefit from PT for Rt knee strength, ROM and endurance progression.     Pt will benefit from skilled therapeutic intervention in order to improve on the following deficits Abnormal gait;Decreased range of motion;Difficulty walking;Decreased activity tolerance;Pain;Decreased mobility;Decreased strength;Increased edema   Rehab Potential Excellent   PT Frequency 3x / week   PT Duration 8 weeks   PT Treatment/Interventions ADLs/Self Care Home Management;Cryotherapy;Electrical Stimulation;Ultrasound;Moist Heat;Stair training;Gait training;Therapeutic exercise;Balance training;Neuromuscular re-education;Patient/family education;Manual techniques;Scar mobilization;Passive range of motion;Taping;Vasopneumatic Device   PT Next Visit Plan strength, endurance, proprioception, edema management as needed.     Consulted and Agree with Plan of Care Patient        Problem List Patient Active Problem List   Diagnosis Date Noted  . Acute blood loss anemia 09/20/2014  . Primary osteoarthritis of right knee 09/14/2014  . Primary osteoarthritis of knee 09/14/2014  . Vitamin B 12 deficiency 07/29/2014  . Obesity (BMI 30-39.9) 12/08/2013  . Osteoarthritis, knee 05/30/2012  . Metabolic syndrome 61/60/7371  . DVT, HX OF 02/10/2010  . Vitamin D deficiency 08/25/2007  . Disorder of bone and cartilage 04/08/2007  . Hypothyroidism 11/18/2006  . ANEMIA-NOS 11/18/2006  . Essential hypertension 11/18/2006  . GERD 11/18/2006    Alisha Burgo, PT 11/18/2014, 10:15 AM  [facility] [address], [place], [place], [address] Phone: [phone]   Fax:  [phone]

## 2014-11-22 ENCOUNTER — Ambulatory Visit: Payer: Medicare Other

## 2014-11-22 DIAGNOSIS — R531 Weakness: Secondary | ICD-10-CM | POA: Diagnosis not present

## 2014-11-22 DIAGNOSIS — R262 Difficulty in walking, not elsewhere classified: Secondary | ICD-10-CM | POA: Diagnosis not present

## 2014-11-22 DIAGNOSIS — R609 Edema, unspecified: Secondary | ICD-10-CM

## 2014-11-22 DIAGNOSIS — M25661 Stiffness of right knee, not elsewhere classified: Secondary | ICD-10-CM

## 2014-11-22 NOTE — Therapy (Signed)
[facility] [address], [place], [place], [address] Phone: [phone]   Fax:  [phone]  Physical Therapy Treatment  Patient Details  Name: Kimberly [name] MRN: [id number] Date of Birth: May 06, 1951 Referring Provider:  Garald Balding, MD  Encounter Date: 11/22/2014      PT End of Session - 11/22/14 1004    Visit Number 18   Number of Visits 20  Medicare   Date for PT Re-Evaluation 12/10/14   PT Start Time 0920   PT Stop Time 1024   PT Time Calculation (min) 64 min   Activity Tolerance Patient tolerated treatment well   Behavior During Therapy [facility] for tasks assessed/performed      Past Medical History  Diagnosis Date  . Anemia 2005    hb 11.5  . Hypertension   . Hypothyroidism     off meds  . DVT (deep venous thrombosis)     2003, coumadin x 6 mon  . GERD (gastroesophageal reflux disease) 2001    no problem now  . Arthritis   . Osteopenia   . Vegetarian diet     eats fish and eggs; no meat    Past Surgical History  Procedure Laterality Date  . Oophorectomy unilateral    . Arthroscopy right knee  03/16/03  . Total knee arthroplasty Right 09/14/2014    Procedure: RIGHT TOTAL KNEE ARTHROPLASTY;  Surgeon: Garald Balding, MD;  Location: [place];  Service: Orthopedics;  Laterality: Right;    There were no vitals filed for this visit.  Visit Diagnosis:  Generalized weakness  Stiffness of knee joint, right  Edema  Difficulty walking      Subjective Assessment - 11/22/14 0931    Subjective No pain today.  Doing well.     Currently in Pain? No/denies                         [facility] Adult PT Treatment/Exercise - 11/22/14 0001    Knee/Hip Exercises: Aerobic   Stationary Bike L2 x 15 minutes  pt arrived early   Knee/Hip Exercises: Machines for Strengthening   Cybex Leg Press St 4 85# B LE 30x, Rt LE 40# 3 x 10   Knee/Hip Exercises: Standing   Lateral Step Up Right;2 sets;10  reps;Hand Hold: 1  sometimes needs 2 Hand hold assist   Forward Step Up Right;20 reps;Hand Hold: 0;Step Height: 8"  fingertip support   Step Down Right;2 sets;10 reps;Step Height: 6";Hand Hold: 2   Walking with Sports Cord 25# 3 ways, 20# Rt sidestepping, 15 # with Lt sidestepping x 10 each  Pt tolerated increased weight well with forward/reverse   Vasopneumatic   Number Minutes Vasopneumatic  15 minutes   Vasopnuematic Location  Knee   Vasopneumatic Pressure Medium   Vasopneumatic Temperature  3 flakes                  PT Short Term Goals - 11/16/14 3329    PT SHORT TERM GOAL #1   Title I with initial HEP   Time 4   Period Weeks   Status Achieved   PT SHORT TERM GOAL #2   Title improved Rt knee ROM -5 to 110  As of 11/16/14  rt knee flex 105    Time 4   Period Weeks   Status On-going   PT SHORT TERM GOAL #3   Title able to amb without AD safely   Time  4   Period Weeks   Status Achieved           PT Long Term Goals - 11/22/14 0936    PT LONG TERM GOAL #1   Title I with advanced HEP   Time 8   Period Weeks   Status On-going  independent in current HEP   PT LONG TERM GOAL #3   Title able to walk for 30 minutes without increased Rt knee pain   Time 8   Period Weeks   Status Achieved  25-30 reported today   PT LONG TERM GOAL #4   Title able to climb stairs with a reciprocal gait   Time 8   Period Weeks   Status On-going  ascends with step-over-step and descends with step-to               Plan - 11/22/14 0950    Clinical Impression Statement Pt tolerated increased weight with resisted walking today.  Pt with gait abnormality on level surface due to Lt knee pain vs Rt knee pain.  Pt with improved stability of the Rt knee yet still demostrates mild instability and reduced endurance for standing and walking.  Pt will benefit from PT for Rt knee strength, ROM and endurance progression.     Pt will benefit from skilled therapeutic intervention in  order to improve on the following deficits Abnormal gait;Decreased range of motion;Difficulty walking;Decreased activity tolerance;Pain;Decreased mobility;Decreased strength;Increased edema   Rehab Potential Excellent   PT Frequency 3x / week   PT Duration 8 weeks   PT Treatment/Interventions ADLs/Self Care Home Management;Cryotherapy;Electrical Stimulation;Ultrasound;Moist Heat;Stair training;Gait training;Therapeutic exercise;Balance training;Neuromuscular re-education;Patient/family education;Manual techniques;Scar mobilization;Passive range of motion;Taping;Vasopneumatic Device   PT Next Visit Plan strength, endurance, proprioception, edema management as needed.  FOTO and G-codes on Friday-assess LTG after FOTO score   Consulted and Agree with Plan of Care Patient        Problem List Patient Active Problem List   Diagnosis Date Noted  . Acute blood loss anemia 09/20/2014  . Primary osteoarthritis of right knee 09/14/2014  . Primary osteoarthritis of knee 09/14/2014  . Vitamin B 12 deficiency 07/29/2014  . Obesity (BMI 30-39.9) 12/08/2013  . Osteoarthritis, knee 05/30/2012  . Metabolic syndrome 37/90/2409  . DVT, HX OF 02/10/2010  . Vitamin D deficiency 08/25/2007  . Disorder of bone and cartilage 04/08/2007  . Hypothyroidism 11/18/2006  . ANEMIA-NOS 11/18/2006  . Essential hypertension 11/18/2006  . GERD 11/18/2006    TAKACS,KELLY, PT 11/22/2014, 10:09 AM  [facility] [address] [place], [place], [address] Phone: [phone]   Fax:  [phone]

## 2014-11-24 ENCOUNTER — Ambulatory Visit: Payer: Medicare Other

## 2014-11-24 DIAGNOSIS — R262 Difficulty in walking, not elsewhere classified: Secondary | ICD-10-CM

## 2014-11-24 DIAGNOSIS — M25661 Stiffness of right knee, not elsewhere classified: Secondary | ICD-10-CM | POA: Diagnosis not present

## 2014-11-24 DIAGNOSIS — R609 Edema, unspecified: Secondary | ICD-10-CM

## 2014-11-24 DIAGNOSIS — R531 Weakness: Secondary | ICD-10-CM

## 2014-11-24 NOTE — Therapy (Signed)
[facility] [address], [place], [place], [address] Phone: [phone]   Fax:  [phone]  Physical Therapy Treatment  Patient Details  Name: Kimberly [name] MRN: [id number] Date of Birth: [birth year] Referring Provider:  Garald Balding, MD  Encounter Date: 11/24/2014      PT End of Session - 11/24/14 0932    Visit Number 19   Number of Visits 20   Date for PT Re-Evaluation 12/10/14   PT Start Time 0920   PT Stop Time 1031   PT Time Calculation (min) 71 min   Activity Tolerance Patient tolerated treatment well   Behavior During Therapy [facility] for tasks assessed/performed      Past Medical History  Diagnosis Date  . Anemia 2005    hb 11.5  . Hypertension   . Hypothyroidism     off meds  . DVT (deep venous thrombosis)     2003, coumadin x 6 mon  . GERD (gastroesophageal reflux disease) 2001    no problem now  . Arthritis   . Osteopenia   . Vegetarian diet     eats fish and eggs; no meat    Past Surgical History  Procedure Laterality Date  . Oophorectomy unilateral    . Arthroscopy right knee  03/16/03  . Total knee arthroplasty Right 09/14/2014    Procedure: RIGHT TOTAL KNEE ARTHROPLASTY;  Surgeon: Garald Balding, MD;  Location: [place];  Service: Orthopedics;  Laterality: Right;    There were no vitals filed for this visit.  Visit Diagnosis:  Generalized weakness  Stiffness of knee joint, right  Edema  Difficulty walking      Subjective Assessment - 11/24/14 0926    Subjective Pt reports that she is doing good today. No pain.    How long can you stand comfortably? Feels stiffness after 20 minutes of standing    Patient Stated Goals walk 2-3 miles, due staitionary bike, return to [place]   Currently in Pain? No/denies            [facility] PT Assessment - 11/24/14 0001    Observation/Other Assessments   Focus on Therapeutic Outcomes (FOTO)  47% limitation                     [facility] Adult PT Treatment/Exercise - 11/24/14 0001    Knee/Hip Exercises: Aerobic   Stationary Bike L2 x 15 minutes  pt arrived early   Elliptical L1 x 6 min   Knee/Hip Exercises: Machines for Strengthening   Cybex Leg Press St 4 90# B LE 30x, Rt LE 40# 3 x 10   Knee/Hip Exercises: Standing   Lateral Step Up Right;2 sets;10 reps;Hand Hold: 1   Forward Step Up Right;20 reps;Hand Hold: 0;Step Height: 8"  Able to with no UE support    Step Down Right;2 sets;10 reps;Step Height: 6";Hand Hold: 2  Requires 2 hand hold    SLS 30 secx3 on rebounder    Walking with Sports Cord 30# backward; #25 forward; 20# sidestep with Lt.; 15# sidestep with Rt.    Vasopneumatic   Number Minutes Vasopneumatic  15 minutes   Vasopnuematic Location  Knee   Vasopneumatic Pressure Medium   Vasopneumatic Temperature  3 flakes                 PT Education - 11/24/14 1032    Education provided Yes   Education Details SLS    Person(s) Educated Patient   Methods  Explanation;Handout   Comprehension Verbalized understanding          PT Short Term Goals - 11/16/14 3500    PT SHORT TERM GOAL #1   Title I with initial HEP   Time 4   Period Weeks   Status Achieved   PT SHORT TERM GOAL #2   Title improved Rt knee ROM -5 to 110  As of 11/16/14  rt knee flex 105    Time 4   Period Weeks   Status On-going   PT SHORT TERM GOAL #3   Title able to amb without AD safely   Time 4   Period Weeks   Status Achieved           PT Long Term Goals - 11/24/14 0934    PT LONG TERM GOAL #1   Title I with advanced HEP   Time 8   Period Weeks   Status On-going   PT LONG TERM GOAL #4   Title able to climb stairs with a reciprocal gait  Has difficulty with going downstairs with a reciporcal gait pattern; able to perform but knee feels stiff    Time 8   Status On-going               Plan - 11/24/14 1013    Clinical Impression Statement Tolerated progression in exercise/strength endurance.  Required min cueing for minimal UE for support when performing exercises. Continues to be able to perform daily functional activities but has difficulty with eccentric control with step down pattern. Will benefit from skilled PT for continued eccentric/LE strengthening, ROM and endurance progression.    Pt will benefit from skilled therapeutic intervention in order to improve on the following deficits Abnormal gait;Decreased range of motion;Difficulty walking;Decreased activity tolerance;Pain;Decreased mobility;Decreased strength;Increased edema   Rehab Potential Excellent   PT Frequency 3x / week   PT Duration 8 weeks   PT Treatment/Interventions ADLs/Self Care Home Management;Cryotherapy;Electrical Stimulation;Ultrasound;Moist Heat;Stair training;Gait training;Therapeutic exercise;Balance training;Neuromuscular re-education;Patient/family education;Manual techniques;Scar mobilization;Passive range of motion;Taping;Vasopneumatic Device   PT Next Visit Plan G-codes, Continue with SLS balancing, edema managmenet, review theraband exercises    Consulted and Agree with Plan of Care Patient        Problem List Patient Active Problem List   Diagnosis Date Noted  . Acute blood loss anemia 09/20/2014  . Primary osteoarthritis of right knee 09/14/2014  . Primary osteoarthritis of knee 09/14/2014  . Vitamin B 12 deficiency 07/29/2014  . Obesity (BMI 30-39.9) 12/08/2013  . Osteoarthritis, knee 05/30/2012  . Metabolic syndrome 93/81/8299  . DVT, HX OF 02/10/2010  . Vitamin D deficiency 08/25/2007  . Disorder of bone and cartilage 04/08/2007  . Hypothyroidism 11/18/2006  . ANEMIA-NOS 11/18/2006  . Essential hypertension 11/18/2006  . GERD 11/18/2006   Reginal Lutes, SPT 11/24/2014 10:59 AM   During this treatment session, the therapist was present, participating in, and directing the treatment. TAKACS,KELLY 11/24/2014, 10:59 AM  [facility] [address], [place], [place], [address] Phone: [phone]   Fax:  [phone]

## 2014-11-24 NOTE — Patient Instructions (Signed)
Single Leg - Eyes Open   Holding support, lift right leg while maintaining balance over other leg. Progress to removing hands from support surface for longer periods of time. Hold__30__ seconds. Repeat __2__ times per session. Do __2x__ sessions per day.  Copyright  VHI. All rights reserved.

## 2014-11-26 ENCOUNTER — Ambulatory Visit: Payer: Medicare Other | Admitting: Physical Therapy

## 2014-11-26 ENCOUNTER — Encounter: Payer: Self-pay | Admitting: Physical Therapy

## 2014-11-26 DIAGNOSIS — M25661 Stiffness of right knee, not elsewhere classified: Secondary | ICD-10-CM

## 2014-11-26 DIAGNOSIS — R262 Difficulty in walking, not elsewhere classified: Secondary | ICD-10-CM

## 2014-11-26 DIAGNOSIS — R609 Edema, unspecified: Secondary | ICD-10-CM

## 2014-11-26 DIAGNOSIS — R531 Weakness: Secondary | ICD-10-CM | POA: Diagnosis not present

## 2014-11-26 NOTE — Therapy (Signed)
[facility] [address] [place], [place], [address] Phone: [phone]   Fax:  [phone]  Patient Details  Name: Kimberly [name] MRN: [id number] Date of Birth: May 15, 1950 Referring Provider:  Eulas Post, MD  Encounter Date: 11/26/2014 Physical Therapy Progress Note  Dates of Reporting Period: 11/03/2014 to 11/26/2014  Objective Reports of Subjective Statement: Patient is feeling better.  Patient is having difficulty with stairs by going step to step pattern.  Patient continues to have difficulty with daily activities and walking.   Objective Measurements: Right knee AROM is as follows: flexion 105 and extension -2.  Right knee strength is as follows: flexion 4+/5 and extension 4/5.   Goal Update: STG # 2 not met due to her right knee flexion being 105 degree. LTG # 1 not met due to learning new exercies, #2 due to ROM deficits, LTG#4 due to going up and down stairs with a step to step pattern, and LTG# 6 due to FOTO score being 47% instead of 46% limitation.   Plan: continue physical therapy to further strengthen her right knee and increase ROM while monitoring for technique and pain.   Reason Skilled Services are Required: to monitor patient with movement, performing exercises correctly and monitor for pain.       Jerline Linzy,PT 11/26/2014, 12:03 PM  [facility] [address] [place], [place], [address] Phone: [phone]   Fax:  [phone]

## 2014-11-26 NOTE — Therapy (Signed)
[facility] [address], [place], [place], [address] Phone: [phone]   Fax:  [phone]  Physical Therapy Treatment  Patient Details  Name: Kimberly [name] MRN: [id number] Date of Birth: 16-Apr-1951 Referring Provider:  Eulas Post, MD  Encounter Date: 11/26/2014      PT End of Session - 11/26/14 0948    Visit Number 20   Number of Visits 20   Date for PT Re-Evaluation 12/10/14   PT Start Time 0914   PT Stop Time 1020   PT Time Calculation (min) 66 min   Activity Tolerance Patient tolerated treatment well   Behavior During Therapy [facility] for tasks assessed/performed      Past Medical History  Diagnosis Date  . Anemia 2005    hb 11.5  . Hypertension   . Hypothyroidism     off meds  . DVT (deep venous thrombosis)     2003, coumadin x 6 mon  . GERD (gastroesophageal reflux disease) 2001    no problem now  . Arthritis   . Osteopenia   . Vegetarian diet     eats fish and eggs; no meat    Past Surgical History  Procedure Laterality Date  . Oophorectomy unilateral    . Arthroscopy right knee  03/16/03  . Total knee arthroplasty Right 09/14/2014    Procedure: RIGHT TOTAL KNEE ARTHROPLASTY;  Surgeon: Garald Balding, MD;  Location: [place];  Service: Orthopedics;  Laterality: Right;    There were no vitals filed for this visit.  Visit Diagnosis:  Generalized weakness  Stiffness of knee joint, right  Edema  Difficulty walking      Subjective Assessment - 11/26/14 0934    Subjective No complain of pain in Rt knee   Currently in Pain? No/denies            [facility] PT Assessment - 11/26/14 0001    Observation/Other Assessments   Focus on Therapeutic Outcomes (FOTO)  47% limitation  evaluation 61%   AROM   Right Knee Extension -2   Right Knee Flexion 105   Strength   Right Knee Flexion 4+/5   Right Knee Extension 4/5                     [facility] Adult PT Treatment/Exercise -  11/26/14 0001    Knee/Hip Exercises: Aerobic   Stationary Bike L2 x 20 minutes   Elliptical L1 x 6 min   Knee/Hip Exercises: Machines for Strengthening   Cybex Leg Press St 4 90# B LE 30x, Rt LE 40# 3 x 10   Knee/Hip Exercises: Standing   Lateral Step Up Right;2 sets;10 reps;Hand Hold: 1   Forward Step Up Right;20 reps;Hand Hold: 0;Step Height: 8"   Step Down Right;2 sets;10 reps;Step Height: 6";Hand Hold: 2   SLS 30 sec x3 at sink   Walking with Sports Cord 30# backward; #25 forward; 20# sidestep with Lt.; 15# sidestep with Rt.    Vasopneumatic   Number Minutes Vasopneumatic  15 minutes   Vasopnuematic Location  Knee  Rt   Vasopneumatic Pressure Medium   Vasopneumatic Temperature  3 flakes                   PT Short Term Goals - 11/26/14 1019    PT SHORT TERM GOAL #1   Title I with initial HEP   Time 4   Period Weeks   Status Achieved   PT SHORT TERM  GOAL #2   Title improved Rt knee ROM -5 to 110  flexion 105, extension -2 as of 2014/12/05   Time 4   Period Weeks   Status Partially Met   PT SHORT TERM GOAL #3   Title able to amb without AD safely   Time 4   Period Weeks   Status Achieved           PT Long Term Goals - 12-05-2014 1020    PT LONG TERM GOAL #1   Title I with advanced HEP   Time 8   Period Weeks   Status On-going   PT LONG TERM GOAL #2   Title improved Rt knee ROM -2 to 118 degrees   Time 8   Period Weeks   Status On-going   PT LONG TERM GOAL #3   Title able to walk for 30 minutes without increased Rt knee pain   Time 8   Period Weeks   Status Achieved   PT LONG TERM GOAL #4   Title able to climb stairs with a reciprocal gait   Time 8   Period Weeks   Status On-going   PT LONG TERM GOAL #5   Title demo 4+/5 BLE strength to improve function   Time 8   Period Weeks   Status Partially Met   PT LONG TERM GOAL #6   Title improve FOTO to 46% limited  as of 11-25-14 47% limitations   Time 8   Period Weeks   Status Partially Met                Plan - 2014-12-05 0954    Clinical Impression Statement Pt continues to progress with strength and endurance. Pt continues to have difficulties with eccentric control with step down pattern. Pt continues to benefit fro skilled PT to continue with eccentric LE strength, ROM endurance.   Pt will benefit from skilled therapeutic intervention in order to improve on the following deficits Abnormal gait;Decreased range of motion;Difficulty walking;Decreased activity tolerance;Pain;Decreased mobility;Decreased strength;Increased edema   Rehab Potential Excellent   PT Frequency 2x / week   PT Treatment/Interventions ADLs/Self Care Home Management;Cryotherapy;Electrical Stimulation;Ultrasound;Moist Heat;Stair training;Gait training;Therapeutic exercise;Balance training;Neuromuscular re-education;Patient/family education;Manual techniques;Scar mobilization;Passive range of motion;Taping;Vasopneumatic Device   PT Next Visit Plan Continue with advanced balance as SLS, eccentric strength, edema managment   PT Home Exercise Plan current HEP   Consulted and Agree with Plan of Care Patient          G-Codes - 12/05/14 1148    Functional Assessment Tool Used FOTO score is 47% limitation  goal is 46% limitation   Functional Limitation Mobility: Walking and moving around   Mobility: Walking and Moving Around Current Status [phone]) At least 40 percent but less than 60 percent impaired, limited or restricted   Mobility: Walking and Moving Around Goal Status [phone]) At least 40 percent but less than 60 percent impaired, limited or restricted      Problem List Patient Active Problem List   Diagnosis Date Noted  . Acute blood loss anemia 09/20/2014  . Primary osteoarthritis of right knee 09/14/2014  . Primary osteoarthritis of knee 09/14/2014  . Vitamin B 12 deficiency 07/29/2014  . Obesity (BMI 30-39.9) 12/08/2013  . Osteoarthritis, knee 05/30/2012  . Metabolic syndrome 60/73/7106  .  DVT, HX OF 02/10/2010  . Vitamin D deficiency 08/25/2007  . Disorder of bone and cartilage 04/08/2007  . Hypothyroidism 11/18/2006  . ANEMIA-NOS 11/18/2006  . Essential hypertension 11/18/2006  .  GERD 11/18/2006   Earlie Counts, PT 11/26/2014 11:49 AM   Rivka Barbara, PTA 11/26/2014 11:49 AM  [facility] [address] [place], [place], [address] Phone: [phone]   Fax:  [phone]

## 2014-11-29 ENCOUNTER — Encounter: Payer: Self-pay | Admitting: Physical Therapy

## 2014-11-29 ENCOUNTER — Ambulatory Visit: Payer: Medicare Other | Admitting: Physical Therapy

## 2014-11-29 DIAGNOSIS — R531 Weakness: Secondary | ICD-10-CM

## 2014-11-29 DIAGNOSIS — R609 Edema, unspecified: Secondary | ICD-10-CM | POA: Diagnosis not present

## 2014-11-29 DIAGNOSIS — M25661 Stiffness of right knee, not elsewhere classified: Secondary | ICD-10-CM | POA: Diagnosis not present

## 2014-11-29 DIAGNOSIS — R262 Difficulty in walking, not elsewhere classified: Secondary | ICD-10-CM | POA: Diagnosis not present

## 2014-11-29 NOTE — Therapy (Signed)
[facility] [address] [place], [place], [address] Phone: [phone]   Fax:  [phone]  Physical Therapy Treatment  Patient Details  Name: Kimberly [name] MRN: [id number] Date of Birth: Mar 18, 1951 Referring Provider:  Eulas Post, MD  Encounter Date: 11/29/2014      PT End of Session - 11/29/14 0950    Visit Number 21   Number of Visits 30   Date for PT Re-Evaluation 12/10/14   PT Start Time 0918   PT Stop Time 1025   PT Time Calculation (min) 67 min   Activity Tolerance Patient tolerated treatment well   Behavior During Therapy [facility] for tasks assessed/performed      Past Medical History  Diagnosis Date  . Anemia 2005    hb 11.5  . Hypertension   . Hypothyroidism     off meds  . DVT (deep venous thrombosis)     2003, coumadin x 6 mon  . GERD (gastroesophageal reflux disease) 2001    no problem now  . Arthritis   . Osteopenia   . Vegetarian diet     eats fish and eggs; no meat    Past Surgical History  Procedure Laterality Date  . Oophorectomy unilateral    . Arthroscopy right knee  03/16/03  . Total knee arthroplasty Right 09/14/2014    Procedure: RIGHT TOTAL KNEE ARTHROPLASTY;  Surgeon: Garald Balding, MD;  Location: [place];  Service: Orthopedics;  Laterality: Right;    There were no vitals filed for this visit.  Visit Diagnosis:  Generalized weakness  Stiffness of knee joint, right  Edema  Difficulty walking      Subjective Assessment - 11/29/14 0928    Subjective No complain of pain in Rt knee, but Lt knee is hurting   Limitations Standing;Walking   Currently in Pain? No/denies                         [facility] Adult PT Treatment/Exercise - 11/29/14 0001    Knee/Hip Exercises: Aerobic   Stationary Bike L2 x 15 minutes   Elliptical L1 x 6 min   Knee/Hip Exercises: Machines for Strengthening   Cybex Leg Press St 4 90# B LE 30x, Rt LE 45# 1 x 10, 40 2 x10    Knee/Hip Exercises: Standing   Lateral Step Up Right;2 sets;10 reps;Step Height: 8";Hand Hold: 1   Forward Step Up Right;20 reps;Step Height: 8";Limitations;Hand Hold: 1   Step Down Right;2 sets;10 reps;Step Height: 6";Hand Hold: 2   SLS 30 sec x3 at sink   Walking with Sports Cord #25 forward; 20# sidestep  Lt. & Rt x 10 each direction    Modalities   Modalities --  to Rt knee and incision side   Vasopneumatic   Number Minutes Vasopneumatic  15 minutes   Vasopnuematic Location  Knee   Vasopneumatic Pressure Medium   Vasopneumatic Temperature  3 flakes    Manual Therapy   Manual Therapy Soft tissue mobilization                  PT Short Term Goals - 11/29/14 0935    PT SHORT TERM GOAL #1   Title I with initial HEP   Time 4   Period Weeks   Status Achieved   PT SHORT TERM GOAL #2   Title improved Rt knee ROM -5 to 110   Time 4   Period Weeks   Status Partially  Met   PT SHORT TERM GOAL #3   Title able to amb without AD safely   Time 4   Period Weeks   Status Achieved           PT Long Term Goals - 11/29/14 0936    PT LONG TERM GOAL #1   Title I with advanced HEP   Time 9   Period Weeks   Status On-going   PT LONG TERM GOAL #2   Title improved Rt knee ROM -2 to 118 degrees   Time 8   Period Weeks   Status On-going   PT LONG TERM GOAL #3   Title able to walk for 30 minutes without increased Rt knee pain   Time 8   Period Weeks   Status Achieved   PT LONG TERM GOAL #4   Title able to climb stairs with a reciprocal gait   Time 8   Period Weeks   Status On-going   PT LONG TERM GOAL #5   Title demo 4+/5 BLE strength to improve function   Time 8   Period Weeks   Status Partially Met   PT LONG TERM GOAL #6   Title improve FOTO to 46% limited   Time 8   Period Weeks   Status Partially Met               Plan - 11/29/14 0951    Clinical Impression Statement Pt continues to progress with strength and endurance. She presents with waddeling  gait at times due to Lt knee pain, pt palns to have TKA in spring of 2017. Pt continues to have difficulties with eccentric control with step down pattern.. She will continue to benfit from PT to address those issues.    Pt will benefit from skilled therapeutic intervention in order to improve on the following deficits Abnormal gait;Decreased range of motion;Difficulty walking;Decreased activity tolerance;Pain;Decreased mobility;Decreased strength;Increased edema   Rehab Potential Excellent   PT Frequency 2x / week   PT Duration 8 weeks   PT Treatment/Interventions ADLs/Self Care Home Management;Cryotherapy;Electrical Stimulation;Ultrasound;Moist Heat;Stair training;Gait training;Therapeutic exercise;Balance training;Neuromuscular re-education;Patient/family education;Manual techniques;Scar mobilization;Passive range of motion;Taping;Vasopneumatic Device   PT Next Visit Plan Continue with advanced balance as SLS, eccentric strength, edema managment   PT Home Exercise Plan current HEP   Consulted and Agree with Plan of Care Patient        Problem List Patient Active Problem List   Diagnosis Date Noted  . Acute blood loss anemia 09/20/2014  . Primary osteoarthritis of right knee 09/14/2014  . Primary osteoarthritis of knee 09/14/2014  . Vitamin B 12 deficiency 07/29/2014  . Obesity (BMI 30-39.9) 12/08/2013  . Osteoarthritis, knee 05/30/2012  . Metabolic syndrome 18/86/7737  . DVT, HX OF 02/10/2010  . Vitamin D deficiency 08/25/2007  . Disorder of bone and cartilage 04/08/2007  . Hypothyroidism 11/18/2006  . ANEMIA-NOS 11/18/2006  . Essential hypertension 11/18/2006  . GERD 11/18/2006    NAUMANN-HOUEGNIFIO,Josehua Hammar PTA 11/29/2014, 10:17 AM  [facility] [address], [place], [place], [address] Phone: [phone]   Fax:  [phone]

## 2014-12-01 ENCOUNTER — Ambulatory Visit: Payer: Medicare Other | Admitting: Physical Therapy

## 2014-12-01 ENCOUNTER — Encounter: Payer: Self-pay | Admitting: Physical Therapy

## 2014-12-01 ENCOUNTER — Ambulatory Visit (INDEPENDENT_AMBULATORY_CARE_PROVIDER_SITE_OTHER): Payer: Medicare Other | Admitting: Family Medicine

## 2014-12-01 ENCOUNTER — Encounter: Payer: Self-pay | Admitting: Family Medicine

## 2014-12-01 VITALS — BP 126/80 | HR 83 | Temp 98.1°F | Wt 164.0 lb

## 2014-12-01 DIAGNOSIS — R262 Difficulty in walking, not elsewhere classified: Secondary | ICD-10-CM

## 2014-12-01 DIAGNOSIS — E559 Vitamin D deficiency, unspecified: Secondary | ICD-10-CM | POA: Diagnosis not present

## 2014-12-01 DIAGNOSIS — R531 Weakness: Secondary | ICD-10-CM | POA: Diagnosis not present

## 2014-12-01 DIAGNOSIS — R5383 Other fatigue: Secondary | ICD-10-CM | POA: Diagnosis not present

## 2014-12-01 DIAGNOSIS — R609 Edema, unspecified: Secondary | ICD-10-CM

## 2014-12-01 DIAGNOSIS — D62 Acute posthemorrhagic anemia: Secondary | ICD-10-CM | POA: Diagnosis not present

## 2014-12-01 DIAGNOSIS — M25661 Stiffness of right knee, not elsewhere classified: Secondary | ICD-10-CM

## 2014-12-01 LAB — CBC WITH DIFFERENTIAL/PLATELET
Basophils Absolute: 0 10*3/uL (ref 0.0–0.1)
Basophils Relative: 0.4 % (ref 0.0–3.0)
EOS ABS: 0.1 10*3/uL (ref 0.0–0.7)
Eosinophils Relative: 1.5 % (ref 0.0–5.0)
HEMATOCRIT: 36.5 % (ref 36.0–46.0)
HEMOGLOBIN: 12.3 g/dL (ref 12.0–15.0)
LYMPHS ABS: 2.1 10*3/uL (ref 0.7–4.0)
LYMPHS PCT: 32.5 % (ref 12.0–46.0)
MCHC: 33.8 g/dL (ref 30.0–36.0)
MCV: 87.2 fl (ref 78.0–100.0)
MONO ABS: 0.4 10*3/uL (ref 0.1–1.0)
MONOS PCT: 6.8 % (ref 3.0–12.0)
NEUTROS PCT: 58.8 % (ref 43.0–77.0)
Neutro Abs: 3.7 10*3/uL (ref 1.4–7.7)
PLATELETS: 265 10*3/uL (ref 150.0–400.0)
RBC: 4.18 Mil/uL (ref 3.87–5.11)
RDW: 14 % (ref 11.5–15.5)
WBC: 6.3 10*3/uL (ref 4.0–10.5)

## 2014-12-01 LAB — VITAMIN D 25 HYDROXY (VIT D DEFICIENCY, FRACTURES): VITD: 36.31 ng/mL (ref 30.00–100.00)

## 2014-12-01 NOTE — Therapy (Signed)
[facility] [address] [place], [place], [address] Phone: [phone]   Fax:  [phone]  Physical Therapy Treatment  Patient Details  Name: Kimberly [name] MRN: [id number] Date of Birth: 02/14/1951 Referring Provider:  Garald Balding, MD  Encounter Date: 12/01/2014      PT End of Session - 12/01/14 1006    Visit Number 22   Number of Visits 30   Date for PT Re-Evaluation 12/10/14   PT Start Time 0920   PT Stop Time 1030   PT Time Calculation (min) 70 min   Activity Tolerance Patient tolerated treatment well   Behavior During Therapy [facility] for tasks assessed/performed      Past Medical History  Diagnosis Date  . Anemia 2005    hb 11.5  . Hypertension   . Hypothyroidism     off meds  . DVT (deep venous thrombosis)     2003, coumadin x 6 mon  . GERD (gastroesophageal reflux disease) 2001    no problem now  . Arthritis   . Osteopenia   . Vegetarian diet     eats fish and eggs; no meat    Past Surgical History  Procedure Laterality Date  . Oophorectomy unilateral    . Arthroscopy right knee  03/16/03  . Total knee arthroplasty Right 09/14/2014    Procedure: RIGHT TOTAL KNEE ARTHROPLASTY;  Surgeon: Garald Balding, MD;  Location: [place];  Service: Orthopedics;  Laterality: Right;    There were no vitals filed for this visit.  Visit Diagnosis:  Generalized weakness  Stiffness of knee joint, right  Edema  Difficulty walking      Subjective Assessment - 12/01/14 0955    Subjective No complains of pain in Rt knee, Lt knee feels good today   Currently in Pain? No/denies                         [facility] Adult PT Treatment/Exercise - 12/01/14 0001    Knee/Hip Exercises: Aerobic   Stationary Bike L2 x 18 minutes   Elliptical L1 x 6 min   Knee/Hip Exercises: Machines for Strengthening   Cybex Leg Press St 4 95# B LE 30x, Rt LE 45# 1 x 10, 40# 3 x10   Knee/Hip Exercises: Standing    Lateral Step Up Right;2 sets;10 reps;Step Height: 6";Hand Hold: 1  perform on 8" step next visit   Forward Step Up Right;20 reps;Limitations;Step Height: 6";Hand Hold: 0  perform on 8"step next visit   Step Down Right;2 sets;10 reps;Step Height: 6";Hand Hold: 1   SLS 30 sec x3 at sink   Walking with Sports Cord #25 forward/reverse, backward/reverse, sidestep  Lt. & Rt x 10 each direction   pt tolerated incr in weight with sidestepping well   Vasopneumatic   Number Minutes Vasopneumatic  15 minutes   Vasopnuematic Location  Knee   Vasopneumatic Pressure Medium   Vasopneumatic Temperature  3 flakes                   PT Short Term Goals - 11/29/14 0935    PT SHORT TERM GOAL #1   Title I with initial HEP   Time 4   Period Weeks   Status Achieved   PT SHORT TERM GOAL #2   Title improved Rt knee ROM -5 to 110   Time 4   Period Weeks   Status Partially Met   PT SHORT TERM GOAL #  3   Title able to amb without AD safely   Time 4   Period Weeks   Status Achieved           PT Long Term Goals - 11/29/14 0936    PT LONG TERM GOAL #1   Title I with advanced HEP   Time 9   Period Weeks   Status On-going   PT LONG TERM GOAL #2   Title improved Rt knee ROM -2 to 118 degrees   Time 8   Period Weeks   Status On-going   PT LONG TERM GOAL #3   Title able to walk for 30 minutes without increased Rt knee pain   Time 8   Period Weeks   Status Achieved   PT LONG TERM GOAL #4   Title able to climb stairs with a reciprocal gait   Time 8   Period Weeks   Status On-going   PT LONG TERM GOAL #5   Title demo 4+/5 BLE strength to improve function   Time 8   Period Weeks   Status Partially Met   PT LONG TERM GOAL #6   Title improve FOTO to 46% limited   Time 8   Period Weeks   Status Partially Met               Plan - 12/01/14 1006    Clinical Impression Statement Pt continues to progress with strength and endurance as seen in incr weight tolerance with leg  press. Pt still with waddeling gait at times due to Lt knee pain, pt plans to have TKA in spring of 2017. Pt with difficulties with ecentric control with step down pattern.    Pt will benefit from skilled therapeutic intervention in order to improve on the following deficits Abnormal gait;Decreased range of motion;Difficulty walking;Decreased activity tolerance;Pain;Decreased mobility;Decreased strength;Increased edema   Rehab Potential Excellent   PT Frequency 2x / week   PT Duration 8 weeks   PT Treatment/Interventions ADLs/Self Care Home Management;Cryotherapy;Electrical Stimulation;Ultrasound;Moist Heat;Stair training;Gait training;Therapeutic exercise;Balance training;Neuromuscular re-education;Patient/family education;Manual techniques;Scar mobilization;Passive range of motion;Taping;Vasopneumatic Device   PT Next Visit Plan Continue with balance activities, eccentric strengthening, edema managment   Consulted and Agree with Plan of Care Patient        Problem List Patient Active Problem List   Diagnosis Date Noted  . Acute blood loss anemia 09/20/2014  . Primary osteoarthritis of right knee 09/14/2014  . Primary osteoarthritis of knee 09/14/2014  . Vitamin B 12 deficiency 07/29/2014  . Obesity (BMI 30-39.9) 12/08/2013  . Osteoarthritis, knee 05/30/2012  . Metabolic syndrome 31/54/0086  . DVT, HX OF 02/10/2010  . Vitamin D deficiency 08/25/2007  . Disorder of bone and cartilage 04/08/2007  . Hypothyroidism 11/18/2006  . ANEMIA-NOS 11/18/2006  . Essential hypertension 11/18/2006  . GERD 11/18/2006    NAUMANN-HOUEGNIFIO,Zade Falkner PTA 12/01/2014, 10:13 AM  [facility] [address] [place], [place], [address] Phone: [phone]   Fax:  [phone]

## 2014-12-01 NOTE — Progress Notes (Signed)
Pre visit review using our clinic review tool, if applicable. No additional management support is needed unless otherwise documented below in the visit note. 

## 2014-12-01 NOTE — Progress Notes (Signed)
   Subjective:    Patient ID: Kimberly [name], female    DOB: 15-Jun-1950, 64 y.o.   MRN: [id number]  HPI Patient seen with nonspecific fatigue. Onset about 2 months ago after having right total knee replacement. She thinks this may be partly related to postoperative anemia. Hemoglobin on discharge 9.4. She denies any dizziness or lightheadedness. She is generally sleeping well. She's had good recovery from her surgery. Good appetite. No fevers or chills. History of vitamin D deficiency. She takes over-the-counter vitamin D. Recent thyroid function and B12 levels normal. No chest pains or dyspnea.  Past Medical History  Diagnosis Date  . Anemia 2005    hb 11.5  . Hypertension   . Hypothyroidism     off meds  . DVT (deep venous thrombosis)     2003, coumadin x 6 mon  . GERD (gastroesophageal reflux disease) 2001    no problem now  . Arthritis   . Osteopenia   . Vegetarian diet     eats fish and eggs; no meat   Past Surgical History  Procedure Laterality Date  . Oophorectomy unilateral    . Arthroscopy right knee  03/16/03  . Total knee arthroplasty Right 09/14/2014    Procedure: RIGHT TOTAL KNEE ARTHROPLASTY;  Surgeon: Garald Balding, MD;  Location: [place];  Service: Orthopedics;  Laterality: Right;    reports that she has never smoked. She has never used smokeless tobacco. She reports that she does not drink alcohol or use illicit drugs. family history includes Alcohol abuse in her other; Diabetes in her mother and other; Heart failure in her father. Allergies  Allergen Reactions  . Enoxaparin Sodium     REACTION: itching,swelling      Review of Systems  Constitutional: Positive for fatigue. Negative for fever and appetite change.  Respiratory: Negative for cough and shortness of breath.   Cardiovascular: Negative for chest pain, palpitations and leg swelling.  Gastrointestinal: Negative for abdominal pain.  Genitourinary: Negative for dysuria.  Neurological: Negative for  dizziness and weakness.       Objective:   Physical Exam  Constitutional: She appears well-developed and well-nourished. No distress.  HENT:  Mouth/Throat: Oropharynx is clear and moist.  Neck: Neck supple. No thyromegaly present.  Cardiovascular: Normal rate and regular rhythm.   Pulmonary/Chest: Effort normal and breath sounds normal. No respiratory distress. She has no wheezes. She has no rales.  Musculoskeletal: She exhibits no edema.          Assessment & Plan:  Fatigue. Recent postoperative anemia. Recheck CBC. Will check 25-hydroxy vitamin D level. Recent TSH and B12 normal. Consider iron supplement if hemoglobin still low

## 2014-12-03 ENCOUNTER — Ambulatory Visit: Payer: Medicare Other | Admitting: Physical Therapy

## 2014-12-03 ENCOUNTER — Encounter: Payer: Self-pay | Admitting: Physical Therapy

## 2014-12-03 DIAGNOSIS — M25661 Stiffness of right knee, not elsewhere classified: Secondary | ICD-10-CM | POA: Diagnosis not present

## 2014-12-03 DIAGNOSIS — R531 Weakness: Secondary | ICD-10-CM | POA: Diagnosis not present

## 2014-12-03 DIAGNOSIS — R609 Edema, unspecified: Secondary | ICD-10-CM

## 2014-12-03 DIAGNOSIS — R262 Difficulty in walking, not elsewhere classified: Secondary | ICD-10-CM | POA: Diagnosis not present

## 2014-12-03 NOTE — Therapy (Signed)
[facility] [address] [place], [place], [address] Phone: [phone]   Fax:  [phone]  Physical Therapy Treatment  Patient Details  Name: Kimberly [name] MRN: [id number] Date of Birth: 1950-09-25 Referring Provider:  Garald Balding, MD  Encounter Date: 12/03/2014      PT End of Session - 12/03/14 1017    Visit Number 23   Number of Visits 30   Date for PT Re-Evaluation 12/10/14   PT Start Time 0920   PT Stop Time 1031   PT Time Calculation (min) 71 min   Activity Tolerance Patient tolerated treatment well   Behavior During Therapy [facility] for tasks assessed/performed      Past Medical History  Diagnosis Date  . Anemia 2005    hb 11.5  . Hypertension   . Hypothyroidism     off meds  . DVT (deep venous thrombosis)     2003, coumadin x 6 mon  . GERD (gastroesophageal reflux disease) 2001    no problem now  . Arthritis   . Osteopenia   . Vegetarian diet     eats fish and eggs; no meat    Past Surgical History  Procedure Laterality Date  . Oophorectomy unilateral    . Arthroscopy right knee  03/16/03  . Total knee arthroplasty Right 09/14/2014    Procedure: RIGHT TOTAL KNEE ARTHROPLASTY;  Surgeon: Garald Balding, MD;  Location: [place];  Service: Orthopedics;  Laterality: Right;    There were no vitals filed for this visit.  Visit Diagnosis:  Generalized weakness  Stiffness of knee joint, right  Edema      Subjective Assessment - 12/03/14 1012    Subjective No complains of pain, walking is getting better   Limitations Standing;Walking   Currently in Pain? No/denies                         [facility] Adult PT Treatment/Exercise - 12/03/14 0001    Knee/Hip Exercises: Aerobic   Stationary Bike L4 x 89mnutes   Elliptical L1 x 864m   Knee/Hip Exercises: Machines for Strengthening   Cybex Leg Press St 4 95# B LE 30x, Rt LE 45# 3 x 10,   Knee/Hip Exercises: Standing   Lateral  Step Up Right;2 sets;10 reps;Step Height: 6";Hand Hold: 1   Forward Step Up Right;20 reps;Limitations;Step Height: 6";Hand Hold: 0   Step Down Right;2 sets;10 reps;Step Height: 6";Hand Hold: 1   Knee/Hip Exercises: Seated   Other Seated Knee/Hip Exercises Knee machine 10# x 10 B LE into ext/ descend with Rt LE  for eccentric control   Vasopneumatic   Number Minutes Vasopneumatic  15 minutes   Vasopnuematic Location  Knee   Vasopneumatic Pressure Medium   Vasopneumatic Temperature  3 flakes    Manual Therapy   Manual Therapy Soft tissue mobilization  along incision, quadriceps and med hamstrings                  PT Short Term Goals - 11/29/14 0935    PT SHORT TERM GOAL #1   Title I with initial HEP   Time 4   Period Weeks   Status Achieved   PT SHORT TERM GOAL #2   Title improved Rt knee ROM -5 to 110   Time 4   Period Weeks   Status Partially Met   PT SHORT TERM GOAL #3   Title able to amb without AD safely  Time 4   Period Weeks   Status Achieved           PT Long Term Goals - 11/29/14 0936    PT LONG TERM GOAL #1   Title I with advanced HEP   Time 9   Period Weeks   Status On-going   PT LONG TERM GOAL #2   Title improved Rt knee ROM -2 to 118 degrees   Time 8   Period Weeks   Status On-going   PT LONG TERM GOAL #3   Title able to walk for 30 minutes without increased Rt knee pain   Time 8   Period Weeks   Status Achieved   PT LONG TERM GOAL #4   Title able to climb stairs with a reciprocal gait   Time 8   Period Weeks   Status On-going   PT LONG TERM GOAL #5   Title demo 4+/5 BLE strength to improve function   Time 8   Period Weeks   Status Partially Met   PT LONG TERM GOAL #6   Title improve FOTO to 46% limited   Time 8   Period Weeks   Status Partially Met               Plan - 12/03/14 1017    Clinical Impression Statement Pt continues to improve with strength and endurance asseen in tolerance with activities in PT clinic     Pt will benefit from skilled therapeutic intervention in order to improve on the following deficits Abnormal gait;Decreased range of motion;Difficulty walking;Decreased activity tolerance;Pain;Decreased mobility;Decreased strength;Increased edema   Rehab Potential Excellent   PT Frequency 2x / week   PT Duration 8 weeks   PT Treatment/Interventions ADLs/Self Care Home Management;Cryotherapy;Electrical Stimulation;Ultrasound;Moist Heat;Stair training;Gait training;Therapeutic exercise;Balance training;Neuromuscular re-education;Patient/family education;Manual techniques;Scar mobilization;Passive range of motion;Taping;Vasopneumatic Device   PT Next Visit Plan Continue with balance activities, eccentric strengthening, edema managment   Consulted and Agree with Plan of Care Patient        Problem List Patient Active Problem List   Diagnosis Date Noted  . Acute blood loss anemia 09/20/2014  . Primary osteoarthritis of right knee 09/14/2014  . Primary osteoarthritis of knee 09/14/2014  . Vitamin B 12 deficiency 07/29/2014  . Obesity (BMI 30-39.9) 12/08/2013  . Osteoarthritis, knee 05/30/2012  . Metabolic syndrome 91/50/5697  . DVT, HX OF 02/10/2010  . Vitamin D deficiency 08/25/2007  . Disorder of bone and cartilage 04/08/2007  . Hypothyroidism 11/18/2006  . ANEMIA-NOS 11/18/2006  . Essential hypertension 11/18/2006  . GERD 11/18/2006    NAUMANN-HOUEGNIFIO,Lynden Flemmer PTA 12/03/2014, 10:40 AM  [facility] [address], [place], [place], [address] Phone: [phone]   Fax:  [phone]

## 2014-12-06 ENCOUNTER — Ambulatory Visit: Payer: Medicare Other | Admitting: Physical Therapy

## 2014-12-06 ENCOUNTER — Encounter: Payer: Self-pay | Admitting: Physical Therapy

## 2014-12-06 DIAGNOSIS — R609 Edema, unspecified: Secondary | ICD-10-CM | POA: Diagnosis not present

## 2014-12-06 DIAGNOSIS — M25661 Stiffness of right knee, not elsewhere classified: Secondary | ICD-10-CM

## 2014-12-06 DIAGNOSIS — R531 Weakness: Secondary | ICD-10-CM | POA: Diagnosis not present

## 2014-12-06 DIAGNOSIS — R262 Difficulty in walking, not elsewhere classified: Secondary | ICD-10-CM

## 2014-12-06 NOTE — Therapy (Signed)
[facility] [address] [place], [place], [address] Phone: [phone]   Fax:  [phone]  Physical Therapy Treatment  Patient Details  Name: Kimberly [name] MRN: [id number] Date of Birth: 1951-01-24 Referring Provider:  Garald Balding, MD  Encounter Date: 12/06/2014      PT End of Session - 12/06/14 0937    Visit Number 24   Number of Visits 30   Date for PT Re-Evaluation 12/10/14   PT Start Time 0922   PT Stop Time 1032   PT Time Calculation (min) 70 min   Activity Tolerance Patient tolerated treatment well   Behavior During Therapy [facility] for tasks assessed/performed      Past Medical History  Diagnosis Date  . Anemia 2005    hb 11.5  . Hypertension   . Hypothyroidism     off meds  . DVT (deep venous thrombosis)     2003, coumadin x 6 mon  . GERD (gastroesophageal reflux disease) 2001    no problem now  . Arthritis   . Osteopenia   . Vegetarian diet     eats fish and eggs; no meat    Past Surgical History  Procedure Laterality Date  . Oophorectomy unilateral    . Arthroscopy right knee  03/16/03  . Total knee arthroplasty Right 09/14/2014    Procedure: RIGHT TOTAL KNEE ARTHROPLASTY;  Surgeon: Garald Balding, MD;  Location: [place];  Service: Orthopedics;  Laterality: Right;    There were no vitals filed for this visit.  Visit Diagnosis:  Generalized weakness  Stiffness of knee joint, right  Edema  Difficulty walking      Subjective Assessment - 12/06/14 0929    Subjective No complains of pain, knee is doing good, little pain in left   Limitations Standing;Walking   Currently in Pain? No/denies   Pain Score 1   Pain Location Knee   Pain Orientation Left   Pain Descriptors / Indicators Aching   Pain Type Chronic pain   Pain Onset More than a month ago   Pain Frequency Intermittent                         [facility] Adult PT Treatment/Exercise - 12/06/14 0001    Knee/Hip Exercises: Aerobic   Stationary Bike L4 x66mnutes   Elliptical --   Knee/Hip Exercises: Machines for Strengthening   Cybex Leg Press St 4 95# B LE 30x, Rt LE 45# 3 x 10,   Knee/Hip Exercises: Standing   Lateral Step Up Right;2 sets;10 reps;Hand Hold: 1;Step Height: 8"   Forward Step Up Right;20 reps;Limitations;Hand Hold: 0;Step Height: 8"   Step Down Right;2 sets;10 reps;Step Height: 6";Hand Hold: 1   Walking with Sports Cord #25 forward/reverse, backward/reverse, sidestep  Lt. & Rt x 10 each direction    Knee/Hip Exercises: Seated   Other Seated Knee/Hip Exercises Knee machine 10# 2x 10 B LE into ext/ descend with Rt LE  for eccentric control   Vasopneumatic   Number Minutes Vasopneumatic  15 minutes   Vasopnuematic Location  Knee   Vasopneumatic Pressure Medium   Vasopneumatic Temperature  3 flakes    Manual Therapy   Manual Therapy Soft tissue mobilization  along incision side, quadriceps and hamstrings                   PT Short Term Goals - 11/29/14 0935    PT SHORT TERM GOAL #1  Title I with initial HEP   Time 4   Period Weeks   Status Achieved   PT SHORT TERM GOAL #2   Title improved Rt knee ROM -5 to 110   Time 4   Period Weeks   Status Partially Met   PT SHORT TERM GOAL #3   Title able to amb without AD safely   Time 4   Period Weeks   Status Achieved           PT Long Term Goals - 11/29/14 0936    PT LONG TERM GOAL #1   Title I with advanced HEP   Time 9   Period Weeks   Status On-going   PT LONG TERM GOAL #2   Title improved Rt knee ROM -2 to 118 degrees   Time 8   Period Weeks   Status On-going   PT LONG TERM GOAL #3   Title able to walk for 30 minutes without increased Rt knee pain   Time 8   Period Weeks   Status Achieved   PT LONG TERM GOAL #4   Title able to climb stairs with a reciprocal gait   Time 8   Period Weeks   Status On-going   PT LONG TERM GOAL #5   Title demo 4+/5 BLE strength to improve function    Time 8   Period Weeks   Status Partially Met   PT LONG TERM GOAL #6   Title improve FOTO to 46% limited   Time 8   Period Weeks   Status Partially Met               Plan - 12/06/14 0938    Clinical Impression Statement Pt continues to improve with strength and endurance as observed in tolerance with activities in PT clinic   Pt will benefit from skilled therapeutic intervention in order to improve on the following deficits Abnormal gait;Decreased range of motion;Difficulty walking;Decreased activity tolerance;Pain;Decreased mobility;Decreased strength;Increased edema   PT Frequency 2x / week   PT Duration 8 weeks   PT Treatment/Interventions ADLs/Self Care Home Management;Cryotherapy;Electrical Stimulation;Ultrasound;Moist Heat;Stair training;Gait training;Therapeutic exercise;Balance training;Neuromuscular re-education;Patient/family education;Manual techniques;Scar mobilization;Passive range of motion;Taping;Vasopneumatic Device   PT Next Visit Plan FOTO and D/C next visit    PT Home Exercise Plan current HEP   Consulted and Agree with Plan of Care Patient        Problem List Patient Active Problem List   Diagnosis Date Noted  . Acute blood loss anemia 09/20/2014  . Primary osteoarthritis of right knee 09/14/2014  . Primary osteoarthritis of knee 09/14/2014  . Vitamin B 12 deficiency 07/29/2014  . Obesity (BMI 30-39.9) 12/08/2013  . Osteoarthritis, knee 05/30/2012  . Metabolic syndrome 04/08/2012  . DVT, HX OF 02/10/2010  . Vitamin D deficiency 08/25/2007  . Disorder of bone and cartilage 04/08/2007  . Hypothyroidism 11/18/2006  . ANEMIA-NOS 11/18/2006  . Essential hypertension 11/18/2006  . GERD 11/18/2006    NAUMANN-HOUEGNIFIO,ELKE PTA 12/06/2014, 10:16 AM  [facility] [address], [address] [place], [place], [address] Phone: [phone]   Fax:  [phone]      

## 2014-12-08 ENCOUNTER — Ambulatory Visit: Payer: Medicare Other

## 2014-12-08 DIAGNOSIS — R531 Weakness: Secondary | ICD-10-CM

## 2014-12-08 DIAGNOSIS — M25661 Stiffness of right knee, not elsewhere classified: Secondary | ICD-10-CM

## 2014-12-08 DIAGNOSIS — R609 Edema, unspecified: Secondary | ICD-10-CM | POA: Diagnosis not present

## 2014-12-08 DIAGNOSIS — R262 Difficulty in walking, not elsewhere classified: Secondary | ICD-10-CM

## 2014-12-08 NOTE — Patient Instructions (Signed)
Chair Push-Up   Do not hold at arm rests. Slowly lower down, hold for 3 seconds and then return to sitting position. Repeat __10__ times. Do 3 sets.   http://gt2.exer.us/466   Copyright  VHI. All rights reserved.

## 2014-12-08 NOTE — Therapy (Signed)
[facility] [address] [place], [place], [address] Phone: [phone]   Fax:  [phone]  Physical Therapy Treatment  Patient Details  Name: Kimberly [name] MRN: [id number] Date of Birth: 10-14-1950 Referring Provider:  Garald Balding, MD  Encounter Date: 12/08/2014      PT End of Session - 12/08/14 0953    Visit Number 25   Number of Visits 30   Date for PT Re-Evaluation 12/10/14   PT Start Time 0920   PT Stop Time 1015   PT Time Calculation (min) 55 min   Activity Tolerance Patient tolerated treatment well   Behavior During Therapy [facility] for tasks assessed/performed      Past Medical History  Diagnosis Date  . Anemia 2005    hb 11.5  . Hypertension   . Hypothyroidism     off meds  . DVT (deep venous thrombosis)     2003, coumadin x 6 mon  . GERD (gastroesophageal reflux disease) 2001    no problem now  . Arthritis   . Osteopenia   . Vegetarian diet     eats fish and eggs; no meat    Past Surgical History  Procedure Laterality Date  . Oophorectomy unilateral    . Arthroscopy right knee  03/16/03  . Total knee arthroplasty Right 09/14/2014    Procedure: RIGHT TOTAL KNEE ARTHROPLASTY;  Surgeon: Garald Balding, MD;  Location: [place];  Service: Orthopedics;  Laterality: Right;    There were no vitals filed for this visit.  Visit Diagnosis:  Generalized weakness  Stiffness of knee joint, right  Difficulty walking      Subjective Assessment - 12/08/14 0928    Subjective Going good; has been busy with grandchildren and daughter in town    How long can you walk comfortably? Feels like she could do more than 30 minutes    Patient Stated Goals walk 2-3 miles, due staitionary bike, return to [place]   Currently in Pain? No/denies                         [facility] Adult PT Treatment/Exercise - 12/08/14 0001    Knee/Hip Exercises: Aerobic   Stationary Bike L4 x52minutes   Elliptical  L1 x 46min   Knee/Hip Exercises: Machines for Strengthening   Cybex Leg Press St 4 95# B LE 30x, Rt LE 50# 3 x 10,   Knee/Hip Exercises: Standing   Heel Raises --   Lateral Step Up Right;2 sets;Hand Hold: 1;Step Height: 8";20 reps   Forward Step Up Right;20 reps;Hand Hold: 0;Step Height: 8";10 reps   Step Down Right;2 sets;Step Height: 6";Hand Hold: 1;20 reps   Functional Squat --   SLS 30 sec x3 on rebounder   One finger support    Walking with Sports Cord #25 reverse, backward/reverse, sidestep  Lt. & Rt x 10 each direction   30# backward    Other Standing Knee Exercises sit to stand; eccentric hold with lowering for 3 seconds; 3x10    Knee/Hip Exercises: Supine   Other Supine Knee/Hip Exercises --                PT Education - 12/08/14 1004    Education provided Yes   Education Details Chair sit down and hold    Person(s) Educated Patient   Methods Explanation;Handout   Comprehension Verbalized understanding          PT Short Term Goals -  12/08/14 0933    PT SHORT TERM GOAL #2   Title improved Rt knee ROM -5 to 110   Time 4   Period Weeks           PT Long Term Goals - 12/08/14 5284    PT LONG TERM GOAL #4   Title able to climb stairs with a reciprocal gait  Able to ascend stiars with a rail, but continues to have difficulty with descending stairs    Time 8   Period Weeks   Status On-going               Plan - 12/08/14 0953    Clinical Impression Statement Continues to advance with LE exercises by increasing weight for LE strengthening and reducing required UE support. Continues to have difficulty with eccentric control of quads for descending stairs. Will benefit from skilled  PT for continued  review of LE strengthening exercises and review of final HEP    Pt will benefit from skilled therapeutic intervention in order to improve on the following deficits Abnormal gait;Decreased range of motion;Difficulty walking;Decreased activity  tolerance;Pain;Decreased mobility;Decreased strength;Increased edema   Rehab Potential Excellent   PT Frequency 2x / week   PT Duration 8 weeks   PT Treatment/Interventions ADLs/Self Care Home Management;Cryotherapy;Electrical Stimulation;Ultrasound;Moist Heat;Stair training;Gait training;Therapeutic exercise;Balance training;Neuromuscular re-education;Patient/family education;Manual techniques;Scar mobilization;Passive range of motion;Taping;Vasopneumatic Device   PT Next Visit Plan D/C next session, FOTO and review HEP, review goals    Consulted and Agree with Plan of Care Patient        Problem List Patient Active Problem List   Diagnosis Date Noted  . Acute blood loss anemia 09/20/2014  . Primary osteoarthritis of right knee 09/14/2014  . Primary osteoarthritis of knee 09/14/2014  . Vitamin B 12 deficiency 07/29/2014  . Obesity (BMI 30-39.9) 12/08/2013  . Osteoarthritis, knee 05/30/2012  . Metabolic syndrome 13/24/4010  . DVT, HX OF 02/10/2010  . Vitamin D deficiency 08/25/2007  . Disorder of bone and cartilage 04/08/2007  . Hypothyroidism 11/18/2006  . ANEMIA-NOS 11/18/2006  . Essential hypertension 11/18/2006  . GERD 11/18/2006   Reginal Lutes, SPT 12/08/2014 10:56 AM   During this treatment session, the therapist was present, participating in, and directing the treatment. TAKACS,KELLY 12/08/2014, 10:56 AM  [facility] [address], [place], [place], [address] Phone: [phone]   Fax:  [phone]

## 2014-12-10 ENCOUNTER — Encounter: Payer: Self-pay | Admitting: Physical Therapy

## 2014-12-10 ENCOUNTER — Ambulatory Visit: Payer: Medicare Other | Admitting: Physical Therapy

## 2014-12-10 DIAGNOSIS — R262 Difficulty in walking, not elsewhere classified: Secondary | ICD-10-CM

## 2014-12-10 DIAGNOSIS — M25661 Stiffness of right knee, not elsewhere classified: Secondary | ICD-10-CM

## 2014-12-10 DIAGNOSIS — R531 Weakness: Secondary | ICD-10-CM | POA: Diagnosis not present

## 2014-12-10 DIAGNOSIS — R609 Edema, unspecified: Secondary | ICD-10-CM | POA: Diagnosis not present

## 2014-12-10 NOTE — Therapy (Signed)
[facility] [address] [place], [place], [address] Phone: [phone]   Fax:  [phone]  Physical Therapy Treatment  Patient Details  Name: Kimberly [name] MRN: [id number] Date of Birth: 10-05-1950 Referring Provider:  Garald Balding, MD  Encounter Date: 12/10/2014      PT End of Session - 12/10/14 0948    Visit Number 26   Number of Visits 30   Date for PT Re-Evaluation 12/10/14   PT Start Time 0925   PT Stop Time 1015   PT Time Calculation (min) 50 min   Activity Tolerance Patient tolerated treatment well   Behavior During Therapy [facility] for tasks assessed/performed      Past Medical History  Diagnosis Date  . Anemia 2005    hb 11.5  . Hypertension   . Hypothyroidism     off meds  . DVT (deep venous thrombosis)     2003, coumadin x 6 mon  . GERD (gastroesophageal reflux disease) 2001    no problem now  . Arthritis   . Osteopenia   . Vegetarian diet     eats fish and eggs; no meat    Past Surgical History  Procedure Laterality Date  . Oophorectomy unilateral    . Arthroscopy right knee  03/16/03  . Total knee arthroplasty Right 09/14/2014    Procedure: RIGHT TOTAL KNEE ARTHROPLASTY;  Surgeon: Garald Balding, MD;  Location: [place];  Service: Orthopedics;  Laterality: Right;    There were no vitals filed for this visit.  Visit Diagnosis:  Generalized weakness  Stiffness of knee joint, right  Difficulty walking      Subjective Assessment - 12/10/14 0943    Subjective doing good, continues to be busy with grandchildren and daughter in town, pt feels ready for D/C. Pt is planning to have left knee TKA in March 2017   Limitations Standing;Walking   Currently in Pain? No/denies            [facility] PT Assessment - 12/10/14 0001    Observation/Other Assessments   Focus on Therapeutic Outcomes (FOTO)  36% limitations  evaluation 61%   AROM   Right Knee Extension -2   Right Knee Flexion  108   Strength   Right Knee Flexion 4+/5   Right Knee Extension 4+/5                     [facility] Adult PT Treatment/Exercise - 12/10/14 0001    Knee/Hip Exercises: Aerobic   Stationary Bike L4 x89mnutes   Elliptical L5 Resistance 5 x 839m   Knee/Hip Exercises: Machines for Strengthening   Cybex Leg Press St 4 95# B LE 30x, Rt LE 50# 3 x 10,   Knee/Hip Exercises: Standing   Lateral Step Up Right;2 sets;Hand Hold: 1;Step Height: 8";20 reps   Forward Step Up Right;20 reps;Hand Hold: 0;Step Height: 8";10 reps   Step Down Right;2 sets;Step Height: 6";Hand Hold: 1;20 reps   SLS 30 sec x3 on rebounder    Walking with Sports Cord #25 reverse, backward/reverse, sidestep  Lt. & Rt x 10 each direction    Manual Therapy   Manual Therapy Soft tissue mobilization  along incision side, hamstrings and gastrocnemius                  PT Short Term Goals - 12/08/14 0933    PT SHORT TERM GOAL #2   Title improved Rt knee ROM -5 to 110  Time 4   Period Weeks           PT Long Term Goals - 12/10/14 0955    PT LONG TERM GOAL #1   Title I with advanced HEP   Time 8   Period Weeks   Status Achieved   PT LONG TERM GOAL #2   Title improved Rt knee ROM -2 to 118 degrees  Lt knee ROM -2 & 108 degrees of flexion   Time 8   Period Weeks   Status Partially Met   PT LONG TERM GOAL #3   Title able to walk for 30 minutes without increased Rt knee pain   Time 8   Period Weeks   Status Achieved   PT LONG TERM GOAL #4   Title able to climb stairs with a reciprocal gait   Time 8   Period Weeks   Status Achieved   PT LONG TERM GOAL #5   Title demo 4+/5 BLE strength to improve function   Time 8   Period Weeks   Status Achieved   PT LONG TERM GOAL #6   Title improve FOTO to 46% limited  As of 12/10/2014 36% limitations D/C status CJ   Time 8   Period Weeks   Status Achieved               Plan - 12/10/14 0953    Clinical Impression Statement Pt with improved  strength, endurance no complain of pain, no limitations with functional ADL's. pt is D/C    Pt will benefit from skilled therapeutic intervention in order to improve on the following deficits Abnormal gait;Decreased range of motion;Difficulty walking;Decreased activity tolerance;Pain;Decreased mobility;Decreased strength;Increased edema   Rehab Potential Excellent   PT Frequency 2x / week   PT Duration 8 weeks   PT Treatment/Interventions ADLs/Self Care Home Management;Cryotherapy;Electrical Stimulation;Ultrasound;Moist Heat;Stair training;Gait training;Therapeutic exercise;Balance training;Neuromuscular re-education;Patient/family education;Manual techniques;Scar mobilization;Passive range of motion;Taping;Vasopneumatic Device   PT Next Visit Plan D/C   Consulted and Agree with Plan of Care Patient        Problem List Patient Active Problem List   Diagnosis Date Noted  . Acute blood loss anemia 09/20/2014  . Primary osteoarthritis of right knee 09/14/2014  . Primary osteoarthritis of knee 09/14/2014  . Vitamin B 12 deficiency 07/29/2014  . Obesity (BMI 30-39.9) 12/08/2013  . Osteoarthritis, knee 05/30/2012  . Metabolic syndrome 02/40/9735  . DVT, HX OF 02/10/2010  . Vitamin D deficiency 08/25/2007  . Disorder of bone and cartilage 04/08/2007  . Hypothyroidism 11/18/2006  . ANEMIA-NOS 11/18/2006  . Essential hypertension 11/18/2006  . GERD 11/18/2006     Rivka Barbara, PTA 12/10/2014 10:24 AM   [facility] [address], [place], [place], [address] Phone: [phone]   Fax:  [phone]     PHYSICAL THERAPY DISCHARGE SUMMARY  Visits from Start of Care: 26 Current functional level related to goals / functional outcomes: See above. Patient has not met LTG# 2 due to ROM is -2 to 108 degrees instead of -2 to 118 degrees.    Remaining deficits: Flexion is 108 degrees.   Education /  Equipment: HEP Plan: Patient agrees to discharge.  Patient goals were met. Patient is being discharged due to meeting the stated rehab goals. Thank you for the referral. Earlie Counts, PT 12/10/2014 11:47 AM   ?????

## 2015-01-24 DIAGNOSIS — M25551 Pain in right hip: Secondary | ICD-10-CM | POA: Diagnosis not present

## 2015-01-24 DIAGNOSIS — M170 Bilateral primary osteoarthritis of knee: Secondary | ICD-10-CM | POA: Diagnosis not present

## 2015-01-24 DIAGNOSIS — M545 Low back pain: Secondary | ICD-10-CM | POA: Diagnosis not present

## 2015-01-26 ENCOUNTER — Encounter: Payer: Self-pay | Admitting: Physical Therapy

## 2015-01-26 ENCOUNTER — Ambulatory Visit: Payer: Medicare Other | Attending: Orthopaedic Surgery | Admitting: Physical Therapy

## 2015-01-26 DIAGNOSIS — M6281 Muscle weakness (generalized): Secondary | ICD-10-CM | POA: Insufficient documentation

## 2015-01-26 DIAGNOSIS — R609 Edema, unspecified: Secondary | ICD-10-CM | POA: Insufficient documentation

## 2015-01-26 DIAGNOSIS — M79605 Pain in left leg: Secondary | ICD-10-CM

## 2015-01-26 DIAGNOSIS — R262 Difficulty in walking, not elsewhere classified: Secondary | ICD-10-CM | POA: Insufficient documentation

## 2015-01-26 DIAGNOSIS — R29898 Other symptoms and signs involving the musculoskeletal system: Secondary | ICD-10-CM

## 2015-01-26 DIAGNOSIS — R531 Weakness: Secondary | ICD-10-CM | POA: Diagnosis not present

## 2015-01-26 DIAGNOSIS — M545 Low back pain, unspecified: Secondary | ICD-10-CM

## 2015-01-26 DIAGNOSIS — M25661 Stiffness of right knee, not elsewhere classified: Secondary | ICD-10-CM | POA: Insufficient documentation

## 2015-01-26 NOTE — Therapy (Signed)
[facility] [address], [place], [place], [address] Phone: [phone]   Fax:  [phone]  Physical Therapy Evaluation  Patient Details  Name: Kimberly [name] MRN: [id number] Date of Birth: [birth year] Referring Provider:  Garald Balding, MD  Encounter Date: 01/26/2015      PT End of Session - 01/26/15 1011    Visit Number 1   Date for PT Re-Evaluation 03/23/15   PT Start Time 0930   PT Stop Time 1030   PT Time Calculation (min) 60 min   Activity Tolerance Patient tolerated treatment well   Behavior During Therapy [facility] for tasks assessed/performed      Past Medical History  Diagnosis Date  . Anemia 2005    hb 11.5  . Hypertension   . Hypothyroidism     off meds  . DVT (deep venous thrombosis)     2003, coumadin x 6 mon  . GERD (gastroesophageal reflux disease) 2001    no problem now  . Arthritis   . Osteopenia   . Vegetarian diet     eats fish and eggs; no meat    Past Surgical History  Procedure Laterality Date  . Oophorectomy unilateral    . Arthroscopy right knee  03/16/03  . Total knee arthroplasty Right 09/14/2014    Procedure: RIGHT TOTAL KNEE ARTHROPLASTY;  Surgeon: Garald Balding, MD;  Location: [place];  Service: Orthopedics;  Laterality: Right;    There were no vitals filed for this visit.  Visit Diagnosis:  Lumbar pain with radiation down left leg - Plan: PT plan of care cert/re-cert  Weakness of back - Plan: PT plan of care cert/re-cert      Subjective Assessment - 01/26/15 0936    Subjective Patient reports in August she went to see her son in [place], [place] and noticed back pain that went into her left hip with sudden onset. Pain would come and go.    How long can you sit comfortably? 30 min   How long can you stand comfortably? constant pain   How long can you walk comfortably? slight pain   Patient Stated Goals reduce pain with sitting and return to gym   Currently in Pain?  Yes   Pain Score 5    Pain Location Back   Pain Orientation Left   Pain Descriptors / Indicators Aching   Pain Type Acute pain   Pain Radiating Towards radiates into back of left thigh   Pain Onset More than a month ago   Pain Frequency Intermittent   Aggravating Factors  sitting   Pain Relieving Factors standing and walking; sitting on right hip   Multiple Pain Sites No            [facility] PT Assessment - 01/26/15 0001    Assessment   Medical Diagnosis Low back pain, osteoarthritis of lumbar sacral spine   Onset Date/Surgical Date 12/13/14   Prior Therapy None   Precautions   Precautions None   Balance Screen   Has the patient fallen in the past 6 months No   Has the patient had a decrease in activity level because of a fear of falling?  No   Is the patient reluctant to leave their home because of a fear of falling?  No   Prior Function   Level of [place]   Cognition   Overall Cognitive Status Within Functional Limits for tasks assessed   Observation/Other Assessments   Focus on Therapeutic  Outcomes (FOTO)  43% limitation  goal is 30% limitation   Posture/Postural Control   Posture/Postural Control Postural limitations   Postural Limitations Rounded Shoulders;Forward head;Decreased lumbar lordosis;Weight shift left   AROM   AROM Assessment Site Lumbar   Lumbar Flexion decreased by 50% deviating to the right   Lumbar Extension decreased by 25% with pain on left   Lumbar - Right Side Bend decreased by 50%   Lumbar - Left Side Bend full with pain   Lumbar - Right Rotation decreased by 25%   Lumbar - Left Rotation decreaesd by 25%   PROM   Left Hip External Rotation  40   Strength   Left Hip Extension 4/5   Left Hip ABduction 3+/5   Palpation   Spinal mobility L5 rotated left; Decreased mobility at L1-L5   SI assessment  pelvis in correct alignment, palpable tenderness located in left psoas,    Palpation comment tenderness located in left psoas, left  sacral sulcus, left gluteus medius, around left greater trochanter                   [facility] Adult PT Treatment/Exercise - 01/26/15 0001    Modalities   Modalities Electrical Stimulation;Moist Heat   Moist Heat Therapy   Number Minutes Moist Heat 20 Minutes   Moist Heat Location Lumbar Spine  supine   Electrical Stimulation   Electrical Stimulation Location lumbar supine   Electrical Stimulation Action IFC   Electrical Stimulation Parameters to tolerance 20 min   Electrical Stimulation Goals Pain                PT Education - 01/26/15 1010    Education provided Yes   Education Details piriformis stretch, SKC, trunk rotation   Person(s) Educated Patient   Methods Explanation;Demonstration;Verbal cues;Handout   Comprehension Returned demonstration;Verbalized understanding          PT Short Term Goals - 01/26/15 1008    PT SHORT TERM GOAL #1   Title pain with sitting decreased >/= 25%   Time 4   Period Weeks   Status New   PT SHORT TERM GOAL #2   Title pain with standing for 30 minutes decreased >/= 25%   Time 4   Period Weeks   Status New   PT SHORT TERM GOAL #3   Title pain with walking decreased >/= 25%   Time 4   Period Weeks   Status New           PT Long Term Goals - 01/26/15 1005    PT LONG TERM GOAL #1   Title Independent with HEP   Time 8   Period Weeks   Status New   PT LONG TERM GOAL #2   Title decreased pain with sitting >/= 75% for 1 hour   Time 8   Period Weeks   Status New   PT LONG TERM GOAL #3   Title stand to cook for 20 min with pain decreased >/= 75%   Time 8   Period Weeks   Status New   PT LONG TERM GOAL #4   Title walking for 30 min with pain decreased >/= 75%   Time 8   Period Weeks   Status New   PT LONG TERM GOAL #5   Title understand correct body mechanincs with daily tasks to decrease strain on back   Time 8   Period Weeks   Status New   PT LONG TERM GOAL #6  Title return to exercising at her gym  due to increased lumbar mobility   Time 8   Period [place] - 02/24/2015 1012    Clinical Impression Statement Patient is a 64 year old female with diagnosis of low back pain, osteoarthritis in lumbar sacral area.  FOTO score is 43%. Patient reports intermittent pain at level 5/10 and worse with sitting.  Lumbar ROM deficits: flexion decreased by 50% going to the right, extesnion decreased by 25% with pain in left, right sidebending decreased by 50%, left sidebending decreased by 25%, and bilateral rotation decreased by 25%. left hip external rotation PROM is 40 degrees. Left extension 4/5 and abduction 3+/5. Plapble tenderness located in left gluteus medias, left piriformis, around left greater trochanter. Patient would benefit from physical therapy to reduce pain and increase mobility.    Pt will benefit from skilled therapeutic intervention in order to improve on the following deficits Decreased range of motion;Increased fascial restricitons;Decreased endurance;Increased muscle spasms;Decreased activity tolerance;Pain;Decreased mobility;Decreased strength;Impaired flexibility   Rehab Potential Excellent   Clinical Impairments Affecting Rehab Potential None   PT Frequency 2x / week   PT Duration 8 weeks   PT Treatment/Interventions ADLs/Self Care Home Management;Cryotherapy;Electrical Stimulation;Iontophoresis 4mg /ml Dexamethasone;Moist Heat;Therapeutic exercise;Therapeutic activities;Ultrasound;Neuromuscular re-education;Patient/family education;Manual techniques;Passive range of motion   PT Next Visit Plan soft tissue work, joint mobilization to left hip and spine, flexibility exercises, body mechanincs   PT Home Exercise Plan fleibility exercises   Recommended Other Services None   Consulted and Agree with Plan of Care Patient          G-Codes - 2015-02-24 1018    Functional Assessment Tool Used FOTO score is 43% limitation   Functional Limitation Other  PT primary   Mobility: Walking and Moving Around Current Status (N0272) At least 40 percent but less than 60 percent impaired, limited or restricted   Mobility: Walking and Moving Around Goal Status [phone]) At least 20 percent but less than 40 percent impaired, limited or restricted       Problem List Patient Active Problem List   Diagnosis Date Noted  . Acute blood loss anemia 09/20/2014  . Primary osteoarthritis of right knee 09/14/2014  . Primary osteoarthritis of knee 09/14/2014  . Vitamin B 12 deficiency 07/29/2014  . Obesity (BMI 30-39.9) 12/08/2013  . Osteoarthritis, knee 05/30/2012  . Metabolic syndrome 40/34/7425  . DVT, HX OF 02/10/2010  . Vitamin D deficiency 08/25/2007  . Disorder of bone and cartilage 04/08/2007  . Hypothyroidism 11/18/2006  . ANEMIA-NOS 11/18/2006  . Essential hypertension 11/18/2006  . GERD 11/18/2006    Breccan Galant,PT Feb 24, 2015, 10:21 AM  [facility] [address] [place], [place], [address] Phone: [phone]   Fax:  [phone]

## 2015-01-26 NOTE — Patient Instructions (Signed)
Knee-to-Chest Stretch: Unilateral   With hand behind right knee, pull knee in to chest until a comfortable stretch is felt in lower back and buttocks. Keep back relaxed. Hold __15__ seconds. Repeat _2___ times per set. Do __1__ sets per session. Do _2___ sessions per day.  http://orth.exer.us/126   Copyright  VHI. All rights reserved.  Lower Trunk Rotation Stretch   Keeping back flat and feet together, rotate knees to left side. Hold __30__ seconds. Repeat __2__ times per set. Do __1__ sets per session. Do _2___ sessions per day.  http://orth.exer.us/122   Copyright  VHI. All rights reserved.  Piriformis Stretch, Sitting   Sit, one ankle on opposite knee, same-side hand on crossed knee. Push down on knee, keeping spine straight. Lean torso forward, with flat back, until tension is felt in hamstrings and gluteals of crossed-leg side. Hold _30__ seconds.  Repeat __2_ times per session. Do _2__ sessions per day.  Copyright  VHI. All rights reserved.  [place] [address], [place], [place] [address] Phone # [phone] Fax [phone]

## 2015-01-28 ENCOUNTER — Other Ambulatory Visit: Payer: Self-pay | Admitting: Family Medicine

## 2015-01-28 DIAGNOSIS — Z1231 Encounter for screening mammogram for malignant neoplasm of breast: Secondary | ICD-10-CM

## 2015-01-31 ENCOUNTER — Ambulatory Visit: Payer: Medicare Other | Admitting: Physical Therapy

## 2015-01-31 ENCOUNTER — Encounter: Payer: Self-pay | Admitting: Physical Therapy

## 2015-01-31 DIAGNOSIS — M545 Low back pain, unspecified: Secondary | ICD-10-CM

## 2015-01-31 DIAGNOSIS — R609 Edema, unspecified: Secondary | ICD-10-CM | POA: Diagnosis not present

## 2015-01-31 DIAGNOSIS — R531 Weakness: Secondary | ICD-10-CM | POA: Diagnosis not present

## 2015-01-31 DIAGNOSIS — M79605 Pain in left leg: Secondary | ICD-10-CM

## 2015-01-31 DIAGNOSIS — M25661 Stiffness of right knee, not elsewhere classified: Secondary | ICD-10-CM | POA: Diagnosis not present

## 2015-01-31 DIAGNOSIS — R262 Difficulty in walking, not elsewhere classified: Secondary | ICD-10-CM

## 2015-01-31 DIAGNOSIS — R29898 Other symptoms and signs involving the musculoskeletal system: Secondary | ICD-10-CM

## 2015-01-31 DIAGNOSIS — M6281 Muscle weakness (generalized): Secondary | ICD-10-CM | POA: Diagnosis not present

## 2015-01-31 NOTE — Therapy (Signed)
[facility] [address] [place], [place], [address] Phone: [phone]   Fax:  [phone]  Physical Therapy Treatment  Patient Details  Name: Kimberly [name] MRN: [id number] Date of Birth: [birth year] Referring Provider:  Garald Balding, MD  Encounter Date: 01/31/2015      PT End of Session - 01/31/15 1035    Visit Number 2   Number of Visits 10   Date for PT Re-Evaluation 03/23/15   PT Start Time 5573   PT Stop Time 1116   PT Time Calculation (min) 61 min   Activity Tolerance Patient tolerated treatment well   Behavior During Therapy [facility] for tasks assessed/performed      Past Medical History  Diagnosis Date  . Anemia 2005    hb 11.5  . Hypertension   . Hypothyroidism     off meds  . DVT (deep venous thrombosis)     2003, coumadin x 6 mon  . GERD (gastroesophageal reflux disease) 2001    no problem now  . Arthritis   . Osteopenia   . Vegetarian diet     eats fish and eggs; no meat    Past Surgical History  Procedure Laterality Date  . Oophorectomy unilateral    . Arthroscopy right knee  03/16/03  . Total knee arthroplasty Right 09/14/2014    Procedure: RIGHT TOTAL KNEE ARTHROPLASTY;  Surgeon: Garald Balding, MD;  Location: [place];  Service: Orthopedics;  Laterality: Right;    There were no vitals filed for this visit.  Visit Diagnosis:  Lumbar pain with radiation down left leg  Weakness of back  Generalized weakness  Stiffness of knee joint, right  Difficulty walking  Edema      Subjective Assessment - 01/31/15 1024    Subjective Pain is in lleft ow back rated as 4/10. Pt traveled in August to see her son in [place], [place], while reaching for the baby to the left she hurt her back. Pain comes and goes.   Currently in Pain? Yes   Pain Score 4    Pain Location Back   Pain Orientation Left   Pain Descriptors / Indicators Aching   Pain Onset More than a month ago   Multiple Pain  Sites No                         [facility] Adult PT Treatment/Exercise - 01/31/15 0001    Bed Mobility   Bed Mobility --  practiced proper bodymechanics and getting in and out of bed   Posture/Postural Control   Posture/Postural Control Postural limitations   Postural Limitations Rounded Shoulders;Forward head;Decreased lumbar lordosis;Weight shift left   Exercises   Exercises Lumbar   Lumbar Exercises: Stretches   Active Hamstring Stretch 2 reps;30 seconds  using strap each leg   Single Knee to Chest Stretch 3 reps;30 seconds  each leg   Double Knee to Chest Stretch 2 reps;30 seconds  each leg   Lower Trunk Rotation 2 reps;30 seconds  each side   Piriformis Stretch 2 reps;30 seconds  each leg using strap   Lumbar Exercises: Aerobic   UBE (Upper Arm Bike) L1 x 10min, (3/3) sitting in chair unsupported    Modalities   Modalities Electrical Stimulation;Moist Heat   Moist Heat Therapy   Number Minutes Moist Heat 20 Minutes   Moist Heat Location Lumbar Spine  supine   Electrical Stimulation   Electrical Stimulation Location lumbar supine  Electrical Stimulation Action IFC   Electrical Stimulation Parameters to tolerance   Electrical Stimulation Goals Pain                PT Education - 01/31/15 1035    Education provided Yes   Education Details lifting bodymechanics and transfers in and out of bed   Person(s) Educated Patient   Methods Explanation;Demonstration;Verbal cues;Handout   Comprehension Returned demonstration;Verbalized understanding          PT Short Term Goals - 01/31/15 1042    PT SHORT TERM GOAL #1   Title pain with sitting decreased >/= 25%   Time 4   Period Weeks   Status Achieved   PT SHORT TERM GOAL #2   Title pain with standing for 30 minutes decreased >/= 25%   Time 4   Period Weeks   Status On-going   PT SHORT TERM GOAL #3   Title pain with walking decreased >/= 25%   Time 4   Period Weeks   Status On-going            PT Long Term Goals - 01/31/15 1044    PT LONG TERM GOAL #1   Title Independent with HEP   Time 8   Period Weeks   Status On-going   PT LONG TERM GOAL #2   Title decreased pain with sitting >/= 75% for 1 hour   Time 8   Period Weeks   Status On-going   PT LONG TERM GOAL #3   Title stand to cook for 20 min with pain decreased >/= 75%   Time 8   Period Weeks   Status On-going   PT LONG TERM GOAL #4   Title walking for 30 min with pain decreased >/= 75%   Time 8   Period Weeks   Status On-going   PT LONG TERM GOAL #5   Title understand correct body mechanincs with daily tasks to decrease strain on back   Time 8   Period Weeks   Status On-going               Plan - 01/31/15 1036    Clinical Impression Statement Patient is a 64 year old female with diognosis of low back pain, osteoarthritis in lumbar sacral area. Pain increases with sitting and is rated as 4-5/10. Lumbar ROM; flexion decreased by 50%, left  & right sidebending 25% with pain in left low back area. Palpaple tenderness in left gluteal area. pt will benefit from skille PT.     Pt will benefit from skilled therapeutic intervention in order to improve on the following deficits Decreased range of motion;Increased fascial restricitons;Decreased endurance;Increased muscle spasms;Decreased activity tolerance;Pain;Decreased mobility;Decreased strength;Impaired flexibility   Rehab Potential Excellent   PT Frequency 2x / week   PT Duration 8 weeks   PT Treatment/Interventions ADLs/Self Care Home Management;Cryotherapy;Electrical Stimulation;Iontophoresis 4mg /ml Dexamethasone;Moist Heat;Therapeutic exercise;Therapeutic activities;Ultrasound;Neuromuscular re-education;Patient/family education;Manual techniques;Passive range of motion   PT Next Visit Plan soft tissue work, joint mobilization to left hip and spine, flexibility exercises, body mechanincs   PT Home Exercise Plan bodymechanics   Consulted and Agree  with Plan of Care Patient        Problem List Patient Active Problem List   Diagnosis Date Noted  . Acute blood loss anemia 09/20/2014  . Primary osteoarthritis of right knee 09/14/2014  . Primary osteoarthritis of knee 09/14/2014  . Vitamin B 12 deficiency 07/29/2014  . Obesity (BMI 30-39.9) 12/08/2013  . Osteoarthritis, knee 05/30/2012  .  Metabolic syndrome 81/15/7262  . DVT, HX OF 02/10/2010  . Vitamin D deficiency 08/25/2007  . Disorder of bone and cartilage 04/08/2007  . Hypothyroidism 11/18/2006  . ANEMIA-NOS 11/18/2006  . Essential hypertension 11/18/2006  . GERD 11/18/2006    NAUMANN-HOUEGNIFIO,Benisha Hadaway PTA 01/31/2015, 11:17 AM  [facility] [address], [place], [place], [address] Phone: [phone]   Fax:  [phone]

## 2015-01-31 NOTE — Patient Instructions (Addendum)
   Lifting Principles  .Maintain proper posture and head alignment. .Slide object as close as possible before lifting. .Move obstacles out of the way. .Test before lifting; ask for help if too heavy. .Tighten stomach muscles without holding breath. .Use smooth movements; do not jerk. .Use legs to do the work, and pivot with feet. .Distribute the work load symmetrically and close to the center of trunk. .Push instead of pull whenever possible.   Squat down and hold basket close to stand. Use leg muscles to do the work.    Avoid twisting or bending back. Pivot around using foot movements, and bend at knees if needed when reaching for articles.        Getting Into / Out of Bed   Lower self to lie down on one side by raising legs and lowering head at the same time. Use arms to assist moving without twisting. Bend both knees to roll onto back if desired. To sit up, start from lying on side, and use same move-ments in reverse. Keep trunk aligned with legs.    Shift weight from front foot to back foot as item is lifted off shelf.    When leaning forward to pick object up from floor, extend one leg out behind. Keep back straight. Hold onto a sturdy support with other hand.      Sit upright, head facing forward. Try using a roll to support lower back. Keep shoulders relaxed, and avoid rounded back. Keep hips level with knees. Avoid crossing legs for long periods.     

## 2015-02-02 ENCOUNTER — Ambulatory Visit (HOSPITAL_COMMUNITY)
Admission: RE | Admit: 2015-02-02 | Discharge: 2015-02-02 | Disposition: A | Discharge status: Home / Self Care | Payer: Medicare Other | Source: Ambulatory Visit | Attending: Family Medicine | Admitting: Family Medicine

## 2015-02-02 ENCOUNTER — Other Ambulatory Visit: Payer: Self-pay | Admitting: Family Medicine

## 2015-02-02 DIAGNOSIS — Z1231 Encounter for screening mammogram for malignant neoplasm of breast: Secondary | ICD-10-CM | POA: Insufficient documentation

## 2015-02-03 ENCOUNTER — Ambulatory Visit: Payer: Medicare Other | Admitting: Physical Therapy

## 2015-02-03 ENCOUNTER — Encounter: Payer: Self-pay | Admitting: Physical Therapy

## 2015-02-03 DIAGNOSIS — M545 Low back pain, unspecified: Secondary | ICD-10-CM

## 2015-02-03 DIAGNOSIS — R531 Weakness: Secondary | ICD-10-CM

## 2015-02-03 DIAGNOSIS — M79605 Pain in left leg: Secondary | ICD-10-CM

## 2015-02-03 DIAGNOSIS — M6281 Muscle weakness (generalized): Secondary | ICD-10-CM | POA: Diagnosis not present

## 2015-02-03 DIAGNOSIS — R262 Difficulty in walking, not elsewhere classified: Secondary | ICD-10-CM | POA: Diagnosis not present

## 2015-02-03 DIAGNOSIS — R609 Edema, unspecified: Secondary | ICD-10-CM | POA: Diagnosis not present

## 2015-02-03 DIAGNOSIS — M25661 Stiffness of right knee, not elsewhere classified: Secondary | ICD-10-CM | POA: Diagnosis not present

## 2015-02-03 DIAGNOSIS — R29898 Other symptoms and signs involving the musculoskeletal system: Secondary | ICD-10-CM

## 2015-02-03 NOTE — Patient Instructions (Signed)
EXTENSION: Standing - Resistance Band (Active)   Stand, both feet flat. Against yellow resistance band, draw right leg behind body as far as possible. Complete ___ sets of ___ repetitions. Perform ___ sessions per day.  http://gtsc.exer.us/83   Copyright  VHI. All rights reserved.  EXTENSION: Standing - Resistance Band (Active)   Stand, both feet flat. Against yellow resistance band, draw right leg behind body as far as possible. Complete ___ sets of ___ repetitions. Perform ___ sessions per day.  http://gtsc.exer.us/83   Copyright  VHI. All rights reserved.  FLEXION: Standing - Stable: Resistance Band (Active)   Against yellow resistance band, bend right knee, bringing heel toward buttocks. Complete ___ sets of ___ repetitions. Perform ___ sessions per day.  Copyright  VHI. All rights reserved.

## 2015-02-03 NOTE — Therapy (Signed)
[facility] [address], [place], [place], [address] Phone: [phone]   Fax:  [phone]  Physical Therapy Treatment  Patient Details  Name: Kimberly [name] MRN: [id number] Date of Birth: 1950-07-12 Referring Provider:  Garald Balding, MD  Encounter Date: 02/03/2015      PT End of Session - 02/03/15 1054    Visit Number 3   Number of Visits 10   Date for PT Re-Evaluation 03/23/15   PT Start Time 1007   PT Stop Time 1105   PT Time Calculation (min) 50 min   Activity Tolerance Patient tolerated treatment well   Behavior During Therapy [facility] for tasks assessed/performed      Past Medical History  Diagnosis Date  . Anemia 2005    hb 11.5  . Hypertension   . Hypothyroidism     off meds  . DVT (deep venous thrombosis)     2003, coumadin x 6 mon  . GERD (gastroesophageal reflux disease) 2001    no problem now  . Arthritis   . Osteopenia   . Vegetarian diet     eats fish and eggs; no meat    Past Surgical History  Procedure Laterality Date  . Oophorectomy unilateral    . Arthroscopy right knee  03/16/03  . Total knee arthroplasty Right 09/14/2014    Procedure: RIGHT TOTAL KNEE ARTHROPLASTY;  Surgeon: Garald Balding, MD;  Location: [place];  Service: Orthopedics;  Laterality: Right;    There were no vitals filed for this visit.  Visit Diagnosis:  Lumbar pain with radiation down left leg  Weakness of back  Generalized weakness      Subjective Assessment - 02/03/15 1019    Subjective Pain still present in Left low back/hip.   Pain Score 4    Pain Location Back   Pain Orientation Left   Pain Descriptors / Indicators Aching   Pain Type Acute pain   Pain Onset More than a month ago   Pain Frequency Intermittent                         [facility] Adult PT Treatment/Exercise - 02/03/15 0001    Lumbar Exercises: Stretches   Active Hamstring Stretch 1 rep;60 seconds   Single Knee to  Chest Stretch 3 reps;30 seconds   Double Knee to Chest Stretch 2 reps;30 seconds   Lower Trunk Rotation 2 reps;30 seconds   Piriformis Stretch 2 reps;30 seconds   Lumbar Exercises: Aerobic   Stationary Bike nu step 6 minutes level 3   Lumbar Exercises: Standing   Other Standing Lumbar Exercises hip abd, HS curl, hip ext with red theraband 2 x 10   Moist Heat Therapy   Number Minutes Moist Heat 15 Minutes   Moist Heat Location Lumbar Spine;Hip   Electrical Stimulation   Electrical Stimulation Location Lt hip   Electrical Stimulation Action IFC   Electrical Stimulation Parameters to tolerance   Electrical Stimulation Goals Pain   Manual Therapy   Manual Therapy Soft tissue mobilization   Manual therapy comments Lt hip, ITB, piriformis                PT Education - 02/03/15 1054    Education provided Yes   Education Details HEP theraband exercises   Person(s) Educated Patient   Methods Explanation;Demonstration;Handout   Comprehension Returned demonstration;Verbalized understanding          PT Short Term Goals - 02/03/15  [place] #1   Title pain with sitting decreased >/= 25%   Status Achieved   PT SHORT TERM GOAL #2   Title pain with standing for 30 minutes decreased >/= 25%   Status On-going   PT SHORT TERM GOAL #3   Title pain with walking decreased >/= 25%   Status On-going           PT Long Term Goals - 02/03/15 1056    PT LONG TERM GOAL #1   Title Independent with HEP   Status On-going   PT LONG TERM GOAL #2   Title decreased pain with sitting >/= 75% for 1 hour   Status On-going   PT LONG TERM GOAL #3   Title stand to cook for 20 min with pain decreased >/= 75%   Status On-going   PT LONG TERM GOAL #4   Title walking for 30 min with pain decreased >/= 75%   Status On-going   PT LONG TERM GOAL #5   Title understand correct body mechanincs with daily tasks to decrease strain on back   Status On-going   PT LONG TERM GOAL #6    Title return to exercising at her gym due to increased lumbar mobility   Status On-going               Plan - 02/03/15 1055    Clinical Impression Statement Pt continues with pain in Lt hip and hamstring, pt believes this may be coming from Lt knee which will be replaced in March.  Pt with Lt hip weakness, tightness in Lt hamstring   Pt will benefit from skilled therapeutic intervention in order to improve on the following deficits Decreased range of motion;Increased fascial restricitons;Decreased endurance;Increased muscle spasms;Decreased activity tolerance;Pain;Decreased mobility;Decreased strength;Impaired flexibility   Rehab Potential Excellent   PT Frequency 2x / week   PT Duration 8 weeks   PT Treatment/Interventions ADLs/Self Care Home Management;Cryotherapy;Electrical Stimulation;Iontophoresis 4mg /ml Dexamethasone;Moist Heat;Therapeutic exercise;Therapeutic activities;Ultrasound;Neuromuscular re-education;Patient/family education;Manual techniques;Passive range of motion   PT Next Visit Plan continue hip strengthening and flexibility        Problem List Patient Active Problem List   Diagnosis Date Noted  . Acute blood loss anemia 09/20/2014  . Primary osteoarthritis of right knee 09/14/2014  . Primary osteoarthritis of knee 09/14/2014  . Vitamin B 12 deficiency 07/29/2014  . Obesity (BMI 30-39.9) 12/08/2013  . Osteoarthritis, knee 05/30/2012  . Metabolic syndrome 63/05/6008  . DVT, HX OF 02/10/2010  . Vitamin D deficiency 08/25/2007  . Disorder of bone and cartilage 04/08/2007  . Hypothyroidism 11/18/2006  . ANEMIA-NOS 11/18/2006  . Essential hypertension 11/18/2006  . GERD 11/18/2006    Isabelle Course, PT, DPT  02/03/2015, 10:58 AM  [facility] [address], [place], [place], [address] Phone: [phone]   Fax:  [phone]

## 2015-02-08 ENCOUNTER — Encounter: Payer: Self-pay | Admitting: Physical Therapy

## 2015-02-08 ENCOUNTER — Ambulatory Visit: Payer: Medicare Other | Admitting: Physical Therapy

## 2015-02-08 DIAGNOSIS — R29898 Other symptoms and signs involving the musculoskeletal system: Secondary | ICD-10-CM

## 2015-02-08 DIAGNOSIS — M25661 Stiffness of right knee, not elsewhere classified: Secondary | ICD-10-CM

## 2015-02-08 DIAGNOSIS — R531 Weakness: Secondary | ICD-10-CM | POA: Diagnosis not present

## 2015-02-08 DIAGNOSIS — M545 Low back pain, unspecified: Secondary | ICD-10-CM

## 2015-02-08 DIAGNOSIS — R609 Edema, unspecified: Secondary | ICD-10-CM

## 2015-02-08 DIAGNOSIS — R262 Difficulty in walking, not elsewhere classified: Secondary | ICD-10-CM | POA: Diagnosis not present

## 2015-02-08 DIAGNOSIS — M6281 Muscle weakness (generalized): Secondary | ICD-10-CM | POA: Diagnosis not present

## 2015-02-08 DIAGNOSIS — M79605 Pain in left leg: Secondary | ICD-10-CM

## 2015-02-08 NOTE — Therapy (Signed)
[facility] [address] [place], [place], [address] Phone: [phone]   Fax:  [phone]  Physical Therapy Treatment  Patient Details  Name: Kimberly [name] MRN: [id number] Date of Birth: 1950-07-09 Referring Provider:  Garald Balding, MD  Encounter Date: 02/08/2015      PT End of Session - 02/08/15 1117    Visit Number 4   Number of Visits 10   Date for PT Re-Evaluation 03/23/15   PT Start Time 1050   PT Stop Time 1152   PT Time Calculation (min) 62 min   Activity Tolerance Patient tolerated treatment well   Behavior During Therapy [facility] for tasks assessed/performed      Past Medical History  Diagnosis Date  . Anemia 2005    hb 11.5  . Hypertension   . Hypothyroidism     off meds  . DVT (deep venous thrombosis)     2003, coumadin x 6 mon  . GERD (gastroesophageal reflux disease) 2001    no problem now  . Arthritis   . Osteopenia   . Vegetarian diet     eats fish and eggs; no meat    Past Surgical History  Procedure Laterality Date  . Oophorectomy unilateral    . Arthroscopy right knee  03/16/03  . Total knee arthroplasty Right 09/14/2014    Procedure: RIGHT TOTAL KNEE ARTHROPLASTY;  Surgeon: Garald Balding, MD;  Location: [place];  Service: Orthopedics;  Laterality: Right;    There were no vitals filed for this visit.  Visit Diagnosis:  Lumbar pain with radiation down left leg  Weakness of back  Generalized weakness  Stiffness of knee joint, right  Difficulty walking  Edema      Subjective Assessment - 02/08/15 1108    Subjective Pain in left gluteal area - radiating pain down the right leg into calf   Currently in Pain? Yes   Pain Score 3   sometimes no pain   Pain Location Back   Pain Orientation Left   Pain Descriptors / Indicators Aching   Pain Type Acute pain   Pain Radiating Towards radiates into left thight and sometimes into calf   Pain Onset More than a month ago    Pain Frequency Intermittent   Multiple Pain Sites No                         [facility] Adult PT Treatment/Exercise - 02/08/15 0001    Exercises   Exercises Lumbar   Lumbar Exercises: Stretches   Active Hamstring Stretch 3 reps;20 seconds  in sitting each leg   Single Knee to Chest Stretch 3 reps;30 seconds   Piriformis Stretch 2 reps;30 seconds   Lumbar Exercises: Aerobic   UBE (Upper Arm Bike) L1 x 16min, (5/5) sitting in chair unsupported    Lumbar Exercises: Supine   Other Supine Lumbar Exercises Neural stretch stretch  x 3 with x10 each DF   Modalities   Modalities Electrical Stimulation;Moist Heat   Moist Heat Therapy   Number Minutes Moist Heat 15 Minutes   Moist Heat Location Lumbar Spine;Hip   Electrical Stimulation   Electrical Stimulation Location Lt hip   Electrical Stimulation Action IFC   Electrical Stimulation Parameters to pt. tolerance   Electrical Stimulation Goals Pain   Manual Therapy   Manual Therapy Soft tissue mobilization   Manual therapy comments Lt hip, posterior thight, PROM to incr neural stretch left LE  PT Short Term Goals - 02/08/15 1140    PT SHORT TERM GOAL #1   Title pain with sitting decreased >/= 25%   Time 4   Period Weeks   Status Achieved   PT SHORT TERM GOAL #2   Title pain with standing for 30 minutes decreased >/= 25%   Time 4   Period Weeks   Status On-going   PT SHORT TERM GOAL #3   Title pain with walking decreased >/= 25%   Time 4   Period Weeks   Status On-going           PT Long Term Goals - 02/08/15 1141    PT LONG TERM GOAL #1   Title Independent with HEP   Time 8   Period Weeks   Status On-going   PT LONG TERM GOAL #2   Title decreased pain with sitting >/= 75% for 1 hour   Time 8   Period Weeks   Status On-going   PT LONG TERM GOAL #3   Title stand to cook for 20 min with pain decreased >/= 75%   Time 8   Period Weeks   Status On-going   PT LONG TERM GOAL #4    Title walking for 30 min with pain decreased >/= 75%   Time 8   Period Weeks   Status On-going   PT LONG TERM GOAL #5   Title understand correct body mechanincs with daily tasks to decrease strain on back   Time 8   Period Weeks   Status On-going   PT LONG TERM GOAL #6   Title return to exercising at her gym due to increased lumbar mobility   Time 8   Period Weeks   Status On-going               Plan - 02/08/15 1138    Clinical Impression Statement Pt continues to complain of pain in Lt hip radiating down into Rt posterior thight. Pt with plapaple tightness along left sacral border, gluteus area and hamstrings. Pt will continue to benefit from skilled PT   Pt will benefit from skilled therapeutic intervention in order to improve on the following deficits Decreased range of motion;Increased fascial restricitons;Decreased endurance;Increased muscle spasms;Decreased activity tolerance;Pain;Decreased mobility;Decreased strength;Impaired flexibility   Rehab Potential Excellent   PT Frequency 2x / week   PT Duration 8 weeks   PT Treatment/Interventions ADLs/Self Care Home Management;Cryotherapy;Electrical Stimulation;Iontophoresis 4mg /ml Dexamethasone;Moist Heat;Therapeutic exercise;Therapeutic activities;Ultrasound;Neuromuscular re-education;Patient/family education;Manual techniques;Passive range of motion   PT Next Visit Plan continue hip strengthening and flexibility, neural stretch left LE, modalities   Consulted and Agree with Plan of Care Patient        Problem List Patient Active Problem List   Diagnosis Date Noted  . Acute blood loss anemia 09/20/2014  . Primary osteoarthritis of right knee 09/14/2014  . Primary osteoarthritis of knee 09/14/2014  . Vitamin B 12 deficiency 07/29/2014  . Obesity (BMI 30-39.9) 12/08/2013  . Osteoarthritis, knee 05/30/2012  . Metabolic syndrome 55/97/4163  . DVT, HX OF 02/10/2010  . Vitamin D deficiency 08/25/2007  . Disorder of  bone and cartilage 04/08/2007  . Hypothyroidism 11/18/2006  . ANEMIA-NOS 11/18/2006  . Essential hypertension 11/18/2006  . GERD 11/18/2006    NAUMANN-HOUEGNIFIO,Evangela Heffler PTA 02/08/2015, 11:47 AM  [facility] [address], [place], [place], [address] Phone: [phone]   Fax:  [phone]

## 2015-02-10 ENCOUNTER — Telehealth: Payer: Self-pay | Admitting: Family Medicine

## 2015-02-10 ENCOUNTER — Ambulatory Visit: Payer: Medicare Other

## 2015-02-10 DIAGNOSIS — M545 Low back pain, unspecified: Secondary | ICD-10-CM

## 2015-02-10 DIAGNOSIS — R29898 Other symptoms and signs involving the musculoskeletal system: Secondary | ICD-10-CM

## 2015-02-10 DIAGNOSIS — Z780 Asymptomatic menopausal state: Secondary | ICD-10-CM

## 2015-02-10 DIAGNOSIS — R609 Edema, unspecified: Secondary | ICD-10-CM | POA: Diagnosis not present

## 2015-02-10 DIAGNOSIS — M25661 Stiffness of right knee, not elsewhere classified: Secondary | ICD-10-CM | POA: Diagnosis not present

## 2015-02-10 DIAGNOSIS — M6281 Muscle weakness (generalized): Secondary | ICD-10-CM | POA: Diagnosis not present

## 2015-02-10 DIAGNOSIS — R262 Difficulty in walking, not elsewhere classified: Secondary | ICD-10-CM | POA: Diagnosis not present

## 2015-02-10 DIAGNOSIS — R531 Weakness: Secondary | ICD-10-CM | POA: Diagnosis not present

## 2015-02-10 DIAGNOSIS — M79605 Pain in left leg: Secondary | ICD-10-CM

## 2015-02-10 NOTE — Therapy (Signed)
[facility] [address] [place], [place], [address] Phone: [phone]   Fax:  [phone]  Physical Therapy Treatment  Patient Details  Name: Kimberly [name] MRN: [id number] Date of Birth: 01/30/1951 Referring Provider:  Garald Balding, MD  Encounter Date: 02/10/2015      PT End of Session - 02/10/15 1141    Visit Number 5   Number of Visits 10   Date for PT Re-Evaluation 03/23/15   PT Start Time 1103   PT Stop Time 1155   PT Time Calculation (min) 52 min   Activity Tolerance Patient tolerated treatment well   Behavior During Therapy [facility] for tasks assessed/performed      Past Medical History  Diagnosis Date  . Anemia 2005    hb 11.5  . Hypertension   . Hypothyroidism     off meds  . DVT (deep venous thrombosis)     2003, coumadin x 6 mon  . GERD (gastroesophageal reflux disease) 2001    no problem now  . Arthritis   . Osteopenia   . Vegetarian diet     eats fish and eggs; no meat    Past Surgical History  Procedure Laterality Date  . Oophorectomy unilateral    . Arthroscopy right knee  03/16/03  . Total knee arthroplasty Right 09/14/2014    Procedure: RIGHT TOTAL KNEE ARTHROPLASTY;  Surgeon: Garald Balding, MD;  Location: [place];  Service: Orthopedics;  Laterality: Right;    There were no vitals filed for this visit.  Visit Diagnosis:  Lumbar pain with radiation down left leg  Weakness of back      Subjective Assessment - 02/10/15 1109    Subjective Pt reports that she doesn't feel like her back is feeling better.     Currently in Pain? Yes   Pain Score 3    Pain Location Back   Pain Orientation Left   Pain Descriptors / Indicators Aching   Pain Type Acute pain   Pain Onset More than a month ago                         [facility] Adult PT Treatment/Exercise - 02/10/15 0001    Lumbar Exercises: Stretches   Active Hamstring Stretch 3 reps;20 seconds  strap in supine   Single Knee to Chest Stretch 3 reps;30 seconds   Double Knee to Chest Stretch 2 reps;30 seconds   Lower Trunk Rotation 2 reps;30 seconds   Lumbar Exercises: Aerobic   UBE (Upper Arm Bike) L1 x 8 minutes (4/4)   Modalities   Modalities Electrical Stimulation;Ultrasound   Moist Heat Therapy   Number Minutes Moist Heat 15 Minutes   Moist Heat Location Lumbar Spine   Electrical Stimulation   Electrical Stimulation Location Lt hip   Electrical Stimulation Action IFC   Electrical Stimulation Parameters 15 minutes   Electrical Stimulation Goals Pain   Ultrasound   Ultrasound Location Lt SI joint/lumbar paraspinals   Ultrasound Parameters 1.3 w/cm2 x 8 minutes   Ultrasound Goals Pain                  PT Short Term Goals - 02/08/15 1140    PT SHORT TERM GOAL #1   Title pain with sitting decreased >/= 25%   Time 4   Period Weeks   Status Achieved   PT SHORT TERM GOAL #2   Title pain with standing for 30 minutes decreased >/=  25%   Time 4   Period Weeks   Status On-going   PT SHORT TERM GOAL #3   Title pain with walking decreased >/= 25%   Time 4   Period Weeks   Status On-going           PT Long Term Goals - 02/08/15 1141    PT LONG TERM GOAL #1   Title Independent with HEP   Time 8   Period Weeks   Status On-going   PT LONG TERM GOAL #2   Title decreased pain with sitting >/= 75% for 1 hour   Time 8   Period Weeks   Status On-going   PT LONG TERM GOAL #3   Title stand to cook for 20 min with pain decreased >/= 75%   Time 8   Period Weeks   Status On-going   PT LONG TERM GOAL #4   Title walking for 30 min with pain decreased >/= 75%   Time 8   Period Weeks   Status On-going   PT LONG TERM GOAL #5   Title understand correct body mechanincs with daily tasks to decrease strain on back   Time 8   Period Weeks   Status On-going   PT LONG TERM GOAL #6   Title return to exercising at her gym due to increased lumbar mobility   Time 8   Period Weeks    Status On-going               Plan - 02/10/15 1120    Clinical Impression Statement Pt reports minimal change in pain/symptoms since the start of care.  Pt with lumbar stiffness and painful mobility.  Pt with emphasis on flexibility and pain management.  Pt will benefit from continued from skilled PT for core strength, flexibilty and modalities PRN   Pt will benefit from skilled therapeutic intervention in order to improve on the following deficits Decreased range of motion;Increased fascial restricitons;Decreased endurance;Increased muscle spasms;Decreased activity tolerance;Pain;Decreased mobility;Decreased strength;Impaired flexibility   Rehab Potential Excellent   PT Frequency 2x / week   PT Duration 8 weeks   PT Treatment/Interventions ADLs/Self Care Home Management;Cryotherapy;Electrical Stimulation;Iontophoresis 4mg /ml Dexamethasone;Moist Heat;Therapeutic exercise;Therapeutic activities;Ultrasound;Neuromuscular re-education;Patient/family education;Manual techniques;Passive range of motion   PT Next Visit Plan continue hip strengthening and flexibility, neural stretch left LE, modalities   Consulted and Agree with Plan of Care Patient        Problem List Patient Active Problem List   Diagnosis Date Noted  . Acute blood loss anemia 09/20/2014  . Primary osteoarthritis of right knee 09/14/2014  . Primary osteoarthritis of knee 09/14/2014  . Vitamin B 12 deficiency 07/29/2014  . Obesity (BMI 30-39.9) 12/08/2013  . Osteoarthritis, knee 05/30/2012  . Metabolic syndrome 11/04/7626  . DVT, HX OF 02/10/2010  . Vitamin D deficiency 08/25/2007  . Disorder of bone and cartilage 04/08/2007  . Hypothyroidism 11/18/2006  . ANEMIA-NOS 11/18/2006  . Essential hypertension 11/18/2006  . GERD 11/18/2006    TAKACS,KELLY, PT 02/10/2015, 11:42 AM  [facility] [address], [place], [place], [address] Phone: [phone]    Fax:  [phone]

## 2015-02-10 NOTE — Telephone Encounter (Signed)
She needs a bone density scheduled for her.

## 2015-02-11 NOTE — Telephone Encounter (Signed)
Called and spoke with pt and pt states she had bone density many many years ago.  Pt states she would like to go to [facility] to have her bone density done.  Bone density ordered.

## 2015-02-11 NOTE — Telephone Encounter (Signed)
Pt has been scheduled.  °

## 2015-02-11 NOTE — Telephone Encounter (Signed)
Please schedule pt

## 2015-02-14 ENCOUNTER — Ambulatory Visit: Payer: Medicare Other | Attending: Orthopaedic Surgery | Admitting: Physical Therapy

## 2015-02-14 ENCOUNTER — Encounter: Payer: Self-pay | Admitting: Physical Therapy

## 2015-02-14 DIAGNOSIS — M25661 Stiffness of right knee, not elsewhere classified: Secondary | ICD-10-CM | POA: Diagnosis not present

## 2015-02-14 DIAGNOSIS — R29898 Other symptoms and signs involving the musculoskeletal system: Secondary | ICD-10-CM

## 2015-02-14 DIAGNOSIS — M545 Low back pain, unspecified: Secondary | ICD-10-CM

## 2015-02-14 DIAGNOSIS — R262 Difficulty in walking, not elsewhere classified: Secondary | ICD-10-CM

## 2015-02-14 DIAGNOSIS — M79605 Pain in left leg: Secondary | ICD-10-CM

## 2015-02-14 DIAGNOSIS — R531 Weakness: Secondary | ICD-10-CM | POA: Insufficient documentation

## 2015-02-14 DIAGNOSIS — M6281 Muscle weakness (generalized): Secondary | ICD-10-CM | POA: Insufficient documentation

## 2015-02-14 NOTE — Therapy (Signed)
[facility] [address], [place], [place], [address] Phone: [phone]   Fax:  [phone]  Physical Therapy Treatment  Patient Details  Name: Kimberly [name] MRN: [id number] Date of Birth: Aug 13, 1950 Referring Provider:  Garald Balding, MD  Encounter Date: 02/14/2015      PT End of Session - 02/14/15 1141    Visit Number 6   Number of Visits 10   Date for PT Re-Evaluation 03/23/15   PT Start Time 1106   PT Stop Time 1201   PT Time Calculation (min) 55 min   Activity Tolerance Patient tolerated treatment well   Behavior During Therapy [name] for tasks assessed/performed      Past Medical History  Diagnosis Date  . Anemia 2005    hb 11.5  . Hypertension   . Hypothyroidism     off meds  . DVT (deep venous thrombosis) ([place])     2003, coumadin x 6 mon  . GERD (gastroesophageal reflux disease) 2001    no problem now  . Arthritis   . Osteopenia   . Vegetarian diet     eats fish and eggs; no meat    Past Surgical History  Procedure Laterality Date  . Oophorectomy unilateral    . Arthroscopy right knee  03/16/03  . Total knee arthroplasty Right 09/14/2014    Procedure: RIGHT TOTAL KNEE ARTHROPLASTY;  Surgeon: Garald Balding, MD;  Location: [place];  Service: Orthopedics;  Laterality: Right;    There were no vitals filed for this visit.  Visit Diagnosis:  Lumbar pain with radiation down left leg  Weakness of back  Generalized weakness  Stiffness of knee joint, right  Difficulty walking      Subjective Assessment - 02/14/15 1109    Subjective Pt reports her back feels much better, since last PT session with use of Ultrasound   Currently in Pain? Yes   Pain Score 1    Pain Location Back   Pain Descriptors / Indicators Aching   Pain Type Acute pain   Pain Onset More than a month ago   Pain Frequency Intermittent   Multiple Pain Sites No                         [facility] Adult PT  Treatment/Exercise - 02/14/15 0001    Exercises   Exercises Lumbar   Lumbar Exercises: Stretches   Active Hamstring Stretch 3 reps;20 seconds  using green strap   Single Knee to Chest Stretch 3 reps;30 seconds   Lower Trunk Rotation 2 reps;30 seconds   Lumbar Exercises: Aerobic   UBE (Upper Arm Bike) L1 x 50minutes (3/3)  sitting on green physioball   Electrical Stimulation   Electrical Stimulation Location Lt hip   Electrical Stimulation Action IFC   Electrical Stimulation Parameters 45min   Electrical Stimulation Goals Pain   Ultrasound   Ultrasound Location Lt SI joint   Ultrasound Parameters 100%, 1 Mhz, 1 W/cm2 x 62min   Ultrasound Goals Pain                  PT Short Term Goals - 02/14/15 1145    PT SHORT TERM GOAL #1   Title pain with sitting decreased >/= 25%   Time 4   Period Weeks   Status Achieved   PT SHORT TERM GOAL #2   Title pain with standing for 30 minutes decreased >/= 25%   Time 4  Period Weeks   Status On-going   PT SHORT TERM GOAL #3   Title pain with walking decreased >/= 25%   Time 4   Period Weeks   Status On-going           PT Long Term Goals - 02/14/15 1145    PT LONG TERM GOAL #1   Title Independent with HEP   Time 8   Period Weeks   Status On-going   PT LONG TERM GOAL #2   Title decreased pain with sitting >/= 75% for 1 hour   Time 8   Period Weeks   Status On-going   PT LONG TERM GOAL #3   Title stand to cook for 20 min with pain decreased >/= 75%   Time 8   Period Weeks   Status On-going   PT LONG TERM GOAL #4   Title walking for 30 min with pain decreased >/= 75%   Time 8   Period Weeks   Status On-going   PT LONG TERM GOAL #5   Title understand correct body mechanincs with daily tasks to decrease strain on back   Time 8   Period Weeks   Status On-going   PT LONG TERM GOAL #6   Title return to exercising at her gym due to increased lumbar mobility   Time 8   Period Weeks   Status On-going                Plan - 02/14/15 1143    Clinical Impression Statement Pt reports great improvement since last session, ultrasound seems to help a lot. Pt reports discomfort today as only 1/10. Pt will conti iue to benefit from skilled PT for core strength, flexibility and modalities /ultrasound   Pt will benefit from skilled therapeutic intervention in order to improve on the following deficits Decreased range of motion;Increased fascial restricitons;Decreased endurance;Increased muscle spasms;Decreased activity tolerance;Pain;Decreased mobility;Decreased strength;Impaired flexibility   Rehab Potential Excellent   PT Frequency 2x / week   PT Duration 8 weeks   PT Next Visit Plan continue lumbar felxibility and strength, ultrasound   Consulted and Agree with Plan of Care Patient        Problem List Patient Active Problem List   Diagnosis Date Noted  . Acute blood loss anemia 09/20/2014  . Primary osteoarthritis of right knee 09/14/2014  . Primary osteoarthritis of knee 09/14/2014  . Vitamin B 12 deficiency 07/29/2014  . Obesity (BMI 30-39.9) 12/08/2013  . Osteoarthritis, knee 05/30/2012  . Metabolic syndrome 45/40/9811  . DVT, HX OF 02/10/2010  . Vitamin D deficiency 08/25/2007  . Disorder of bone and cartilage 04/08/2007  . Hypothyroidism 11/18/2006  . ANEMIA-NOS 11/18/2006  . Essential hypertension 11/18/2006  . GERD 11/18/2006    NAUMANN-HOUEGNIFIO,Avari Nevares PTA 02/14/2015, 11:49 AM   [facility] [address], [place], [place], [address] Phone: [phone]   Fax:  [phone]

## 2015-02-16 ENCOUNTER — Ambulatory Visit: Payer: Medicare Other | Admitting: Obstetrics and Gynecology

## 2015-02-16 ENCOUNTER — Ambulatory Visit (INDEPENDENT_AMBULATORY_CARE_PROVIDER_SITE_OTHER)
Admission: RE | Admit: 2015-02-16 | Discharge: 2015-02-16 | Disposition: A | Discharge status: Home / Self Care | Payer: Medicare Other | Source: Ambulatory Visit | Attending: Family Medicine | Admitting: Family Medicine

## 2015-02-16 DIAGNOSIS — Z780 Asymptomatic menopausal state: Secondary | ICD-10-CM

## 2015-02-17 ENCOUNTER — Ambulatory Visit: Payer: Medicare Other | Admitting: Physical Therapy

## 2015-02-17 ENCOUNTER — Encounter: Payer: Self-pay | Admitting: Physical Therapy

## 2015-02-17 DIAGNOSIS — M545 Low back pain, unspecified: Secondary | ICD-10-CM

## 2015-02-17 DIAGNOSIS — M25661 Stiffness of right knee, not elsewhere classified: Secondary | ICD-10-CM | POA: Diagnosis not present

## 2015-02-17 DIAGNOSIS — R531 Weakness: Secondary | ICD-10-CM

## 2015-02-17 DIAGNOSIS — R262 Difficulty in walking, not elsewhere classified: Secondary | ICD-10-CM | POA: Diagnosis not present

## 2015-02-17 DIAGNOSIS — R29898 Other symptoms and signs involving the musculoskeletal system: Secondary | ICD-10-CM

## 2015-02-17 DIAGNOSIS — M79605 Pain in left leg: Secondary | ICD-10-CM

## 2015-02-17 DIAGNOSIS — M6281 Muscle weakness (generalized): Secondary | ICD-10-CM | POA: Diagnosis not present

## 2015-02-17 NOTE — Therapy (Signed)
[facility] [address], [place], [place], [address] Phone: [phone]   Fax:  [phone]  Physical Therapy Treatment  Patient Details  Name: Kimberly [name] MRN: [id number] Date of Birth: 06/10/1950 Referring Provider:  Garald Balding, MD  Encounter Date: 02/17/2015      PT End of Session - 02/17/15 1115    Visit Number 7  KX needed   Number of Visits 10   Date for PT Re-Evaluation 03/23/15   PT Start Time 1100   PT Stop Time 1200   PT Time Calculation (min) 60 min   Activity Tolerance Patient tolerated treatment well   Behavior During Therapy [facility] for tasks assessed/performed      Past Medical History  Diagnosis Date  . Anemia 2005    hb 11.5  . Hypertension   . Hypothyroidism     off meds  . DVT (deep venous thrombosis) ([place])     2003, coumadin x 6 mon  . GERD (gastroesophageal reflux disease) 2001    no problem now  . Arthritis   . Osteopenia   . Vegetarian diet     eats fish and eggs; no meat    Past Surgical History  Procedure Laterality Date  . Oophorectomy unilateral    . Arthroscopy right knee  03/16/03  . Total knee arthroplasty Right 09/14/2014    Procedure: RIGHT TOTAL KNEE ARTHROPLASTY;  Surgeon: Garald Balding, MD;  Location: [place];  Service: Orthopedics;  Laterality: Right;    There were no vitals filed for this visit.  Visit Diagnosis:  Lumbar pain with radiation down left leg  Weakness of back  Generalized weakness      Subjective Assessment - 02/17/15 1105    Subjective Pt reports back continues to improve, the ultrasound seems to help a lot.    Currently in Pain? Yes   Pain Score 1    Pain Location Back   Pain Orientation Left   Pain Descriptors / Indicators Aching   Pain Type Acute pain   Pain Onset More than a month ago   Pain Frequency Intermittent                         [facility] Adult PT Treatment/Exercise - 02/17/15 0001    Exercises    Exercises Lumbar   Lumbar Exercises: Stretches   Active Hamstring Stretch 3 reps;20 seconds  in sitting   Single Knee to Chest Stretch 3 reps;30 seconds   Lower Trunk Rotation 2 reps;30 seconds   Lumbar Exercises: Aerobic   UBE (Upper Arm Bike) L1 x 7minutes (3/3)  sitting on green physioball   Modalities   Modalities Electrical Stimulation;Ultrasound   Moist Heat Therapy   Number Minutes Moist Heat 15 Minutes   Moist Heat Location Lumbar Spine   Electrical Stimulation   Electrical Stimulation Location Lt hip   Electrical Stimulation Action IFC   Electrical Stimulation Parameters 15   Electrical Stimulation Goals Pain   Ultrasound   Ultrasound Location Lt SIJ   Ultrasound Parameters 100%, !MHz, 1 W/cm   Ultrasound Goals Pain                  PT Short Term Goals - 02/14/15 1145    PT SHORT TERM GOAL #1   Title pain with sitting decreased >/= 25%   Time 4   Period Weeks   Status Achieved   PT SHORT TERM GOAL #2  Title pain with standing for 30 minutes decreased >/= 25%   Time 4   Period Weeks   Status On-going   PT SHORT TERM GOAL #3   Title pain with walking decreased >/= 25%   Time 4   Period Weeks   Status On-going           PT Long Term Goals - 02/14/15 1145    PT LONG TERM GOAL #1   Title Independent with HEP   Time 8   Period Weeks   Status On-going   PT LONG TERM GOAL #2   Title decreased pain with sitting >/= 75% for 1 hour   Time 8   Period Weeks   Status On-going   PT LONG TERM GOAL #3   Title stand to cook for 20 min with pain decreased >/= 75%   Time 8   Period Weeks   Status On-going   PT LONG TERM GOAL #4   Title walking for 30 min with pain decreased >/= 75%   Time 8   Period Weeks   Status On-going   PT LONG TERM GOAL #5   Title understand correct body mechanincs with daily tasks to decrease strain on back   Time 8   Period Weeks   Status On-going   PT LONG TERM GOAL #6   Title return to exercising at her gym due to  increased lumbar mobility   Time 8   Period Weeks   Status On-going               Plan - 02/17/15 1148    Clinical Impression Statement Pt reports continues to feel improvement with low back, ultrasound helps. Discomfort rated as 1/10. Pt will continue to benefit from skilled PT to improve strength, flexibility and modalities/ultrasound   Pt will benefit from skilled therapeutic intervention in order to improve on the following deficits Decreased range of motion;Increased fascial restricitons;Decreased endurance;Increased muscle spasms;Decreased activity tolerance;Pain;Decreased mobility;Decreased strength;Impaired flexibility   Rehab Potential Excellent   PT Frequency 2x / week   PT Duration 8 weeks   PT Treatment/Interventions ADLs/Self Care Home Management;Cryotherapy;Electrical Stimulation;Iontophoresis 4mg /ml Dexamethasone;Moist Heat;Therapeutic exercise;Therapeutic activities;Ultrasound;Neuromuscular re-education;Patient/family education;Manual techniques;Passive range of motion   PT Next Visit Plan continue lumbar felxibility and strength, ultrasound   Consulted and Agree with Plan of Care Patient        Problem List Patient Active Problem List   Diagnosis Date Noted  . Acute blood loss anemia 09/20/2014  . Primary osteoarthritis of right knee 09/14/2014  . Primary osteoarthritis of knee 09/14/2014  . Vitamin B 12 deficiency 07/29/2014  . Obesity (BMI 30-39.9) 12/08/2013  . Osteoarthritis, knee 05/30/2012  . Metabolic syndrome 16/02/9603  . DVT, HX OF 02/10/2010  . Vitamin D deficiency 08/25/2007  . Disorder of bone and cartilage 04/08/2007  . Hypothyroidism 11/18/2006  . ANEMIA-NOS 11/18/2006  . Essential hypertension 11/18/2006  . GERD 11/18/2006    NAUMANN-HOUEGNIFIO,Jakye Mullens PTA 02/17/2015, 1:53 PM  [facility] [address], [place], [place], [address] Phone: [phone]   Fax:   [phone]

## 2015-02-21 ENCOUNTER — Ambulatory Visit: Payer: Medicare Other

## 2015-02-21 ENCOUNTER — Encounter: Payer: Self-pay | Admitting: Family Medicine

## 2015-02-21 DIAGNOSIS — M545 Low back pain, unspecified: Secondary | ICD-10-CM

## 2015-02-21 DIAGNOSIS — R531 Weakness: Secondary | ICD-10-CM | POA: Diagnosis not present

## 2015-02-21 DIAGNOSIS — R262 Difficulty in walking, not elsewhere classified: Secondary | ICD-10-CM | POA: Diagnosis not present

## 2015-02-21 DIAGNOSIS — M79605 Pain in left leg: Secondary | ICD-10-CM

## 2015-02-21 DIAGNOSIS — M6281 Muscle weakness (generalized): Secondary | ICD-10-CM | POA: Diagnosis not present

## 2015-02-21 DIAGNOSIS — R29898 Other symptoms and signs involving the musculoskeletal system: Secondary | ICD-10-CM

## 2015-02-21 DIAGNOSIS — M25661 Stiffness of right knee, not elsewhere classified: Secondary | ICD-10-CM | POA: Diagnosis not present

## 2015-02-21 NOTE — Therapy (Signed)
[facility] [address], [place], [place], [address] Phone: [phone]   Fax:  [phone]  Physical Therapy Treatment  Patient Details  Name: Kimberly [name] MRN: [id number] Date of Birth: 1950/08/14 Referring Provider:  Eulas Post, MD  Encounter Date: 02/21/2015      PT End of Session - 02/21/15 1137    Visit Number 8  KX needed   Number of Visits 10   Date for PT Re-Evaluation 03/23/15   PT Start Time 1100   PT Stop Time 1146   PT Time Calculation (min) 46 min   Activity Tolerance Patient tolerated treatment well   Behavior During Therapy [facility] for tasks assessed/performed      Past Medical History  Diagnosis Date  . Anemia 2005    hb 11.5  . Hypertension   . Hypothyroidism     off meds  . DVT (deep venous thrombosis) ([place])     2003, coumadin x 6 mon  . GERD (gastroesophageal reflux disease) 2001    no problem now  . Arthritis   . Osteopenia   . Vegetarian diet     eats fish and eggs; no meat    Past Surgical History  Procedure Laterality Date  . Oophorectomy unilateral    . Arthroscopy right knee  03/16/03  . Total knee arthroplasty Right 09/14/2014    Procedure: RIGHT TOTAL KNEE ARTHROPLASTY;  Surgeon: Garald Balding, MD;  Location: [place];  Service: Orthopedics;  Laterality: Right;    There were no vitals filed for this visit.  Visit Diagnosis:  Lumbar pain with radiation down left leg  Weakness of back  Generalized weakness      Subjective Assessment - 02/21/15 1059    Subjective Pt reports that back is feeling good, now just has nerve pain in Lt low back.     Currently in Pain? Yes   Pain Score 3   0/10 back pain   Pain Location Leg   Pain Orientation Left   Pain Descriptors / Indicators Aching   Pain Type Acute pain   Pain Onset More than a month ago   Pain Frequency Intermittent   Aggravating Factors  laying down   Pain Relieving Factors standing and walking                          [facility] Adult PT Treatment/Exercise - 02/21/15 0001    Lumbar Exercises: Stretches   Active Hamstring Stretch 3 reps;20 seconds  in sitting   Single Knee to Chest Stretch 3 reps;30 seconds   Lower Trunk Rotation 2 reps;30 seconds   Lumbar Exercises: Aerobic   Stationary Bike bike: level 3x6 minutes   UBE (Upper Arm Bike) --   Lumbar Exercises: Standing   Other Standing Lumbar Exercises standing extension x 10   Lumbar Exercises: Prone   Straight Leg Raise 20 reps   Other Prone Lumbar Exercises prone on elbows x 3 minutes   Modalities   Modalities Electrical Stimulation   Moist Heat Therapy   Number Minutes Moist Heat 15 Minutes   Moist Heat Location Lumbar Spine   Electrical Stimulation   Electrical Stimulation Location Lt hip   Electrical Stimulation Action IFC   Electrical Stimulation Parameters 15   Electrical Stimulation Goals Pain   Ultrasound   Ultrasound Location Lt SI joint   Ultrasound Parameters 100% 1.3 w/cm2 x 8 minutes   Ultrasound Goals Pain  PT Education - 02/21/15 1117    Education provided Yes   Education Details HEP: [place]enzie extension   Person(s) Educated Patient   Methods Explanation;Demonstration   Comprehension Verbalized understanding;Returned demonstration          PT Short Term Goals - 02/21/15 1111    PT SHORT TERM GOAL #2   Title pain with standing for 30 minutes decreased >/= 25%   Status Achieved   PT SHORT TERM GOAL #3   Title pain with walking decreased >/= 25%   Status Achieved           PT Long Term Goals - 02/14/15 1145    PT LONG TERM GOAL #1   Title Independent with HEP   Time 8   Period Weeks   Status On-going   PT LONG TERM GOAL #2   Title decreased pain with sitting >/= 75% for 1 hour   Time 8   Period Weeks   Status On-going   PT LONG TERM GOAL #3   Title stand to cook for 20 min with pain decreased >/= 75%   Time 8   Period Weeks   Status On-going    PT LONG TERM GOAL #4   Title walking for 30 min with pain decreased >/= 75%   Time 8   Period Weeks   Status On-going   PT LONG TERM GOAL #5   Title understand correct body mechanincs with daily tasks to decrease strain on back   Time 8   Period Weeks   Status On-going   PT LONG TERM GOAL #6   Title return to exercising at her gym due to increased lumbar mobility   Time 8   Period Weeks   Status On-going               Plan - 02/21/15 1112    Clinical Impression Statement Pt reports that LBP is almost gone and now she has Lt LE pain resulting.  Pain is only in Lt LE now.  Pt tolertated prone exercise today.  Pt with pain only with sitting or laying down.  Pain resolves with standing and walking.  Pt will contact MD to discuss continued Lt LE radiculopathy.  Pt will benefti from skilled PT for core strength, extension based exercise as helpful and pain management.   Pt will benefit from skilled therapeutic intervention in order to improve on the following deficits Decreased range of motion;Increased fascial restricitons;Decreased endurance;Increased muscle spasms;Decreased activity tolerance;Pain;Decreased mobility;Decreased strength;Impaired flexibility   Rehab Potential Excellent   PT Frequency 2x / week   PT Duration 8 weeks   PT Treatment/Interventions ADLs/Self Care Home Management;Cryotherapy;Electrical Stimulation;Iontophoresis 4mg /ml Dexamethasone;Moist Heat;Therapeutic exercise;Therapeutic activities;Ultrasound;Neuromuscular re-education;Patient/family education;Manual techniques;Passive range of motion   PT Next Visit Plan continue lumbar felxibility and strength, ultrasound. continue prone/extension based exercise if helpful.     Consulted and Agree with Plan of Care Patient        Problem List Patient Active Problem List   Diagnosis Date Noted  . Acute blood loss anemia 09/20/2014  . Primary osteoarthritis of right knee 09/14/2014  . Primary osteoarthritis of  knee 09/14/2014  . Vitamin B 12 deficiency 07/29/2014  . Obesity (BMI 30-39.9) 12/08/2013  . Osteoarthritis, knee 05/30/2012  . Metabolic syndrome 37/62/8315  . DVT, HX OF 02/10/2010  . Vitamin D deficiency 08/25/2007  . Disorder of bone and cartilage 04/08/2007  . Hypothyroidism 11/18/2006  . ANEMIA-NOS 11/18/2006  . Essential hypertension 11/18/2006  . GERD 11/18/2006  TAKACS,KELLY, PT 02/21/2015, 11:41 AM  [facility] [address], [place], [place], [address] Phone: [phone]   Fax:  [phone]

## 2015-02-21 NOTE — Patient Instructions (Signed)
On Elbows (Prone)  Rise up on elbows as high as possible, keeping hips on floor. Hold 1-5 minutes  Do _3-5___ sessions per day.  Press-Up  Press upper body upward, keeping hips in contact with floor. Keep lower back and buttocks relaxed. Hold _3-5___ seconds. Repeat ____ times per set. Do ____ sessions per day.                     Backward Bend (Standing)   Arch backward to make hollow of back deeper. Hold _5___ seconds. Repeat __10__ times per set. Do ____ sessions per day. Copyright  VHI. All rights reserved.    If you have back pain and the pain travels to your hips and/or legs, please STOP the exercises  If you already have leg pain and the leg pain gets worse, STOP and let your therapist know at your next visit  Occasionally, when you abolish leg pain, you may have a temporary increase in low back pain.  This can be a normal reaction and the back pain should eventually get better.  However, if the pain is too significant to exercise, please STOP the exercises and let your therapist know at your next visit.   

## 2015-02-23 ENCOUNTER — Ambulatory Visit: Payer: Medicare Other | Admitting: Physical Therapy

## 2015-02-23 ENCOUNTER — Encounter: Payer: Self-pay | Admitting: Physical Therapy

## 2015-02-23 DIAGNOSIS — M545 Low back pain, unspecified: Secondary | ICD-10-CM

## 2015-02-23 DIAGNOSIS — M25661 Stiffness of right knee, not elsewhere classified: Secondary | ICD-10-CM | POA: Diagnosis not present

## 2015-02-23 DIAGNOSIS — M6281 Muscle weakness (generalized): Secondary | ICD-10-CM | POA: Diagnosis not present

## 2015-02-23 DIAGNOSIS — R29898 Other symptoms and signs involving the musculoskeletal system: Secondary | ICD-10-CM

## 2015-02-23 DIAGNOSIS — M79605 Pain in left leg: Secondary | ICD-10-CM

## 2015-02-23 DIAGNOSIS — R262 Difficulty in walking, not elsewhere classified: Secondary | ICD-10-CM

## 2015-02-23 DIAGNOSIS — R531 Weakness: Secondary | ICD-10-CM

## 2015-02-23 NOTE — Therapy (Addendum)
[facility] [address], [place], [place], [address] Phone: [phone]   Fax:  [phone]  Physical Therapy Treatment  Patient Details  Name: Kimberly [name] MRN: [id number] Date of Birth: 1951-03-20 Referring Provider:  Garald Balding, MD  Encounter Date: 02/23/2015      PT End of Session - 02/23/15 1105    Visit Number 9   Number of Visits 10   Date for PT Re-Evaluation 03/23/15   PT Start Time 1059   PT Stop Time 1158   PT Time Calculation (min) 59 min   Activity Tolerance Patient tolerated treatment well   Behavior During Therapy [facility] for tasks assessed/performed      Past Medical History  Diagnosis Date  . Anemia 2005    hb 11.5  . Hypertension   . Hypothyroidism     off meds  . DVT (deep venous thrombosis) ([place])     2003, coumadin x 6 mon  . GERD (gastroesophageal reflux disease) 2001    no problem now  . Arthritis   . Osteopenia   . Vegetarian diet     eats fish and eggs; no meat    Past Surgical History  Procedure Laterality Date  . Oophorectomy unilateral    . Arthroscopy right knee  03/16/03  . Total knee arthroplasty Right 09/14/2014    Procedure: RIGHT TOTAL KNEE ARTHROPLASTY;  Surgeon: Garald Balding, MD;  Location: [place];  Service: Orthopedics;  Laterality: Right;    There were no vitals filed for this visit.  Visit Diagnosis:  Lumbar pain with radiation down left leg  Weakness of back  Generalized weakness  Stiffness of knee joint, right  Difficulty walking      Subjective Assessment - 02/23/15 1059    Subjective Pt reports had no complain of pain yesterday, today feeling some pain in left calf area and distal hamstrings   Currently in Pain? Yes   Pain Score 2    Pain Location Leg   Pain Orientation Left   Pain Descriptors / Indicators Aching   Pain Type Acute pain   Pain Onset More than a month ago   Pain Frequency Intermittent                          [facility] Adult PT Treatment/Exercise - 02/23/15 0001    Exercises   Exercises Lumbar   Lumbar Exercises: Stretches   Active Hamstring Stretch 3 reps;20 seconds  in sitting and supine using strap   Single Knee to Chest Stretch 3 reps;20 seconds   Lower Trunk Rotation 3 reps;20 seconds   Lumbar Exercises: Standing   Other Standing Lumbar Exercises standing extension x 10   Lumbar Exercises: Supine   Other Supine Lumbar Exercises bil gluteus release on foam roll    Lumbar Exercises: Prone   Other Prone Lumbar Exercises prone on elbows x 2 minutes   Modalities   Modalities Electrical Stimulation   Moist Heat Therapy   Number Minutes Moist Heat 15 Minutes   Moist Heat Location Lumbar Spine   Electrical Stimulation   Electrical Stimulation Location Lt hip   Electrical Stimulation Action IFC   Electrical Stimulation Parameters 15   Electrical Stimulation Goals Pain   Ultrasound   Ultrasound Location Lt SI joint    Ultrasound Parameters 100%, 1Mhz, 1W/cm2 x 6 min in prone position   Ultrasound Goals Pain  PT Short Term Goals - 02/21/15 1111    PT SHORT TERM GOAL #2   Title pain with standing for 30 minutes decreased >/= 25%   Status Achieved   PT SHORT TERM GOAL #3   Title pain with walking decreased >/= 25%   Status Achieved           PT Long Term Goals - 02/14/15 1145    PT LONG TERM GOAL #1   Title Independent with HEP   Time 8   Period Weeks   Status On-going   PT LONG TERM GOAL #2   Title decreased pain with sitting >/= 75% for 1 hour   Time 8   Period Weeks   Status On-going   PT LONG TERM GOAL #3   Title stand to cook for 20 min with pain decreased >/= 75%   Time 8   Period Weeks   Status On-going   PT LONG TERM GOAL #4   Title walking for 30 min with pain decreased >/= 75%   Time 8   Period Weeks   Status On-going   PT LONG TERM GOAL #5   Title understand correct body mechanincs with  daily tasks to decrease strain on back   Time 8   Period Weeks   Status On-going   PT LONG TERM GOAL #6   Title return to exercising at her gym due to increased lumbar mobility   Time 8   Period Weeks   Status On-going               Plan - 02/23/15 1107    Clinical Impression Statement Pt reports yesterday she had no pain, but today pain is back inposterior thight and gastrocnemius rated as 2/10. Pt with good performance of exercises.   Pt will benefit from skilled therapeutic intervention in order to improve on the following deficits Decreased range of motion;Increased fascial restricitons;Decreased endurance;Increased muscle spasms;Decreased activity tolerance;Pain;Decreased mobility;Decreased strength;Impaired flexibility   Rehab Potential Excellent   PT Frequency 2x / week   PT Duration 8 weeks   PT Next Visit Plan Complete FOTO and G-Code. Continue lumbar flexibility and strength, ultrasound. continue prone/extension based exercise if helpful.     Consulted and Agree with Plan of Care Patient        Problem List Patient Active Problem List   Diagnosis Date Noted  . Acute blood loss anemia 09/20/2014  . Primary osteoarthritis of right knee 09/14/2014  . Primary osteoarthritis of knee 09/14/2014  . Vitamin B 12 deficiency 07/29/2014  . Obesity (BMI 30-39.9) 12/08/2013  . Osteoarthritis, knee 05/30/2012  . Metabolic syndrome 70/05/7492  . DVT, HX OF 02/10/2010  . Vitamin D deficiency 08/25/2007  . Disorder of bone and cartilage 04/08/2007  . Hypothyroidism 11/18/2006  . ANEMIA-NOS 11/18/2006  . Essential hypertension 11/18/2006  . GERD 11/18/2006    NAUMANN-HOUEGNIFIO,Ayah Cozzolino PTA 02/23/2015, 11:45 AM  [facility] [address] [place], [place], [address] Phone: [phone]   Fax:  [phone]

## 2015-02-28 ENCOUNTER — Encounter: Payer: Self-pay | Admitting: Physical Therapy

## 2015-02-28 ENCOUNTER — Encounter: Payer: Self-pay | Admitting: Family Medicine

## 2015-02-28 ENCOUNTER — Ambulatory Visit: Payer: Medicare Other | Admitting: Physical Therapy

## 2015-02-28 ENCOUNTER — Ambulatory Visit (INDEPENDENT_AMBULATORY_CARE_PROVIDER_SITE_OTHER): Payer: Medicare Other | Admitting: Family Medicine

## 2015-02-28 VITALS — BP 150/80 | HR 77 | Temp 97.9°F | Wt 168.3 lb

## 2015-02-28 DIAGNOSIS — M545 Low back pain, unspecified: Secondary | ICD-10-CM

## 2015-02-28 DIAGNOSIS — R262 Difficulty in walking, not elsewhere classified: Secondary | ICD-10-CM

## 2015-02-28 DIAGNOSIS — M8580 Other specified disorders of bone density and structure, unspecified site: Secondary | ICD-10-CM

## 2015-02-28 DIAGNOSIS — Z23 Encounter for immunization: Secondary | ICD-10-CM

## 2015-02-28 DIAGNOSIS — R531 Weakness: Secondary | ICD-10-CM | POA: Diagnosis not present

## 2015-02-28 DIAGNOSIS — I10 Essential (primary) hypertension: Secondary | ICD-10-CM | POA: Diagnosis not present

## 2015-02-28 DIAGNOSIS — M79605 Pain in left leg: Secondary | ICD-10-CM

## 2015-02-28 DIAGNOSIS — R29898 Other symptoms and signs involving the musculoskeletal system: Secondary | ICD-10-CM

## 2015-02-28 DIAGNOSIS — M5416 Radiculopathy, lumbar region: Secondary | ICD-10-CM

## 2015-02-28 DIAGNOSIS — M6281 Muscle weakness (generalized): Secondary | ICD-10-CM | POA: Diagnosis not present

## 2015-02-28 DIAGNOSIS — M25661 Stiffness of right knee, not elsewhere classified: Secondary | ICD-10-CM | POA: Diagnosis not present

## 2015-02-28 NOTE — Progress Notes (Signed)
   Subjective:    Patient ID: Kimberly [name], female    DOB: 1951/04/30, 64 y.o.   MRN: [id number]  HPI Patient seen for several issues  3-4 month history of left lumbar back pain and intermittent radiation of deep achy pain down left lower extremity below the knee. She has pain both with sitting and sometimes with ambulation. Denies any numbness or weakness. Advil helps slightly. No urine or stool incontinence. She's been followed by orthopedist and they have considered MRI scan. She is finishing up six-week course of physical therapy with little to no improvement. No prior history of back surgery.  Severity has been moderate at times. This is limiting her walking.  Hypertension.  Treated with Avapro. Compliant with therapy. Not monitoring blood pressure. No headaches or dizziness. No recent chest pains.  Recent bone density scan. Osteopenia. Minimal change from previous study. She remains on calcium and vitamin D. Walking is been limited recently as above. She is below threshold for treatment for medication based on FRAX score  Past Medical History  Diagnosis Date  . Anemia 2005    hb 11.5  . Hypertension   . Hypothyroidism     off meds  . DVT (deep venous thrombosis) ([place])     2003, coumadin x 6 mon  . GERD (gastroesophageal reflux disease) 2001    no problem now  . Arthritis   . Osteopenia   . Vegetarian diet     eats fish and eggs; no meat   Past Surgical History  Procedure Laterality Date  . Oophorectomy unilateral    . Arthroscopy right knee  03/16/03  . Total knee arthroplasty Right 09/14/2014    Procedure: RIGHT TOTAL KNEE ARTHROPLASTY;  Surgeon: Garald Balding, MD;  Location: [place];  Service: Orthopedics;  Laterality: Right;    reports that she has never smoked. She has never used smokeless tobacco. She reports that she does not drink alcohol or use illicit drugs. family history includes Alcohol abuse in her other; Diabetes in her mother and other; Heart failure in her  father. Allergies  Allergen Reactions  . Enoxaparin Sodium     REACTION: itching,swelling        Review of Systems  Constitutional: Negative for fatigue.  Eyes: Negative for visual disturbance.  Respiratory: Negative for cough, chest tightness, shortness of breath and wheezing.   Cardiovascular: Negative for chest pain, palpitations and leg swelling.  Neurological: Negative for dizziness, seizures, syncope, weakness, light-headedness, numbness and headaches.       Objective:   Physical Exam  Constitutional: She appears well-developed and well-nourished. No distress.  Neck: Neck supple. No thyromegaly present.  Cardiovascular: Normal rate and regular rhythm.   Pulmonary/Chest: Effort normal and breath sounds normal. No respiratory distress. She has no wheezes. She has no rales.  Musculoskeletal: She exhibits no edema.  Straight leg raises are negative  Neurological:  Symmetric reflexes ankle bilaterally. She has full strength throughout left lower extremity          Assessment & Plan:  #1 left lumbar radiculitis. Not improved with physical therapy. Given duration of symptoms we concur with recommendation for MRI. She will check with her orthopedist regarding this #2 hypertension. Slightly elevated reading today here. Confirm repeat at rest 152/80. She will monitor closely over the next couple of weeks. Be in touch if consistently greater than 150/90 #3 osteopenia. Continue regular calcium and vitamin D. We reviewed results of her recent DEXA scan.

## 2015-02-28 NOTE — Therapy (Signed)
[facility] [address], [place], [place], [address] Phone: [phone]   Fax:  [phone]  Physical Therapy Treatment  Patient Details  Name: Kimberly [name] MRN: [id number] Date of Birth: 1951-03-18 Referring Provider: [name]  Encounter Date: 02/28/2015      Kimberly [name] End of Session - 02/28/15 1110    Visit Number [place]   Number of Visits 20   Date for Kimberly [name] Re-Evaluation 03/23/15   Kimberly [name] Start Time 1100   Kimberly [name] Stop Time 1201   Kimberly [name] Time Calculation (min) 61 min   Activity Tolerance Patient tolerated treatment well   Behavior During Therapy [facility] for tasks assessed/performed      Past Medical History  Diagnosis Date  . Anemia 2005    hb 11.5  . Hypertension   . Hypothyroidism     off meds  . DVT (deep venous thrombosis) ([place])     2003, coumadin x 6 mon  . GERD (gastroesophageal reflux disease) 2001    no problem now  . Arthritis   . Osteopenia   . Vegetarian diet     eats fish and eggs; no meat    Past Surgical History  Procedure Laterality Date  . Oophorectomy unilateral    . Arthroscopy right knee  03/16/03  . Total knee arthroplasty Right 09/14/2014    Procedure: RIGHT TOTAL KNEE ARTHROPLASTY;  Surgeon: Garald Balding, MD;  Location: [place];  Service: Orthopedics;  Laterality: Right;    There were no vitals filed for this visit.  Visit Diagnosis:  Lumbar pain with radiation down left leg  Weakness of back  Generalized weakness  Stiffness of knee joint, right  Difficulty walking          [facility] Kimberly [name] Assessment - 02/28/15 0001    Assessment   Medical Diagnosis Low back pain, osteoarthritis of lumbar sacral spine   Referring Provider [name]   Prior Function   Level of Independence Independent   Observation/Other Assessments   Focus on Therapeutic Outcomes (FOTO)  37% limitation                     [facility] Adult Kimberly [name] Treatment/Exercise - 02/28/15 0001    Lumbar  Exercises: Stretches   Active Hamstring Stretch 3 reps;20 seconds  in sitting and supine using towel   Lower Trunk Rotation 3 reps;20 seconds   Piriformis Stretch 2 reps;30 seconds  using towel   Lumbar Exercises: Standing   Other Standing Lumbar Exercises standing extension x 10   Lumbar Exercises: Supine   Other Supine Lumbar Exercises bil gluteus release on foam roll    Lumbar Exercises: Prone   Other Prone Lumbar Exercises prone on elbows x 2 minutes   Modalities   Modalities Electrical Stimulation   Moist Heat Therapy   Number Minutes Moist Heat 15 Minutes   Moist Heat Location Lumbar Spine   Electrical Stimulation   Electrical Stimulation Location Lt hip   Electrical Stimulation Action IFC   Electrical Stimulation Parameters 15   Electrical Stimulation Goals Pain   Ultrasound   Ultrasound Location Lt SI joint   Ultrasound Parameters 100%, 1Mhz, 1Wcm   Ultrasound Goals Pain  in Rt sidelying                  Kimberly [name] Short Term Goals - 02/21/15 1111    Kimberly [name] SHORT TERM GOAL #2   Title pain with standing for 30 minutes decreased >/= 25%  Status Achieved   Kimberly [name] SHORT TERM GOAL #3   Title pain with walking decreased >/= 25%   Status Achieved           Kimberly [name] Long Term Goals - 03/08/15 1116    Kimberly [name] LONG TERM GOAL #1   Title Independent with HEP   Time 8   Period Weeks   Status On-going   Kimberly [name] LONG TERM GOAL #2   Title decreased pain with sitting >/= 75% for 1 hour   Time 8   Period Weeks   Status On-going   Kimberly [name] LONG TERM GOAL #3   Title stand to cook for 20 min with pain decreased >/= 75%   Time 8   Period Weeks   Status Achieved   Kimberly [name] LONG TERM GOAL #4   Title walking for 30 min with pain decreased >/= 75%   Time 8   Period Weeks   Status On-going   Kimberly [name] LONG TERM GOAL #5   Title understand correct body mechanincs with daily tasks to decrease strain on back   Time 8   Period Weeks   Status On-going   Kimberly [name] LONG TERM GOAL #6   Title return to exercising at her  gym due to increased lumbar mobility   Time 8   Period Weeks   Status On-going               Plan - 03/08/15 1111    Clinical Impression Statement Kimberly [name] able to perform exercises well in Kimberly [name], but pain in left LE down to knee sometimes to ankle rated as 2/10. Kimberly [name] wit good demo of exerciises.    Kimberly [name] will benefit from skilled therapeutic intervention in order to improve on the following deficits Decreased range of motion;Increased fascial restricitons;Decreased endurance;Increased muscle spasms;Decreased activity tolerance;Pain;Decreased mobility;Decreased strength;Impaired flexibility   Rehab Potential Excellent   Kimberly [name] Frequency 2x / week   Kimberly [name] Duration 8 weeks   Kimberly [name] Treatment/Interventions ADLs/Self Care Home Management;Cryotherapy;Electrical Stimulation;Iontophoresis 4mg /ml Dexamethasone;Moist Heat;Therapeutic exercise;Therapeutic activities;Ultrasound;Neuromuscular re-education;Patient/family education;Manual techniques;Passive range of motion   Kimberly [name] Next Visit Plan d/C to HEP next visit   Kimberly [name] Home Exercise Plan bodymechanics   Consulted and Agree with Plan of Care Patient          G-Codes - 03/08/2015 1142    Functional Assessment Tool Used FOTO: 34% limitation   Functional Limitation Other Kimberly [name] primary   Mobility: Walking and Moving Around Current Status ([id number]) At least 20 percent but less than 40 percent impaired, limited or restricted   Mobility: Walking and Moving Around Goal Status [phone]) At least 20 percent but less than 40 percent impaired, limited or restricted      Problem List Patient Active Problem List   Diagnosis Date Noted  . Osteopenia 03/08/15  . Left lumbar radiculitis March 08, 2015  . Acute blood loss anemia 09/20/2014  . Primary osteoarthritis of right knee 09/14/2014  . Primary osteoarthritis of knee 09/14/2014  . Vitamin B 12 deficiency 07/29/2014  . Obesity (BMI 30-39.9) 12/08/2013  . Osteoarthritis, knee 05/30/2012  . Metabolic syndrome 59/16/3846  . DVT, HX  OF 02/10/2010  . Vitamin D deficiency 08/25/2007  . Disorder of bone and cartilage 04/08/2007  . Hypothyroidism 11/18/2006  . ANEMIA-NOS 11/18/2006  . Essential hypertension 11/18/2006  . GERD 11/18/2006    Kimberly [name], Kimberly [name] 03/08/2015 11:50 AM  Physical Therapy Progress Note  Dates of Reporting Period: 01/26/15 to 2015/03/08  Objective Reports of Subjective Statement: LBP is 95% improved, Rt LE is 40% better.  Objective Measurements: See above for FOTO.  Goal Update: See above for goal status.    Plan: Continue core strength, extension based exercise and modalities PRN  Reason Skilled Services are Required: Kimberly [name] with continued pain that limits her functional mobility and tolerance.  Kimberly [name] is making progress and will have MRI due to continued pain.  Kimberly [name] will benefit from Kimberly [name] for strength, endurance and pain management.  Kimberly [name], Kimberly [name] 02/28/2015 11:57 AM  [facility] [address] [place], [place], [address] Phone: [phone]   Fax:  [phone]  Name: Kimberly [name] MRN: [id number] Date of Birth: Jan 07, 1951

## 2015-02-28 NOTE — Progress Notes (Signed)
Pre visit review using our clinic review tool, if applicable. No additional management support is needed unless otherwise documented below in the visit note. 

## 2015-02-28 NOTE — Patient Instructions (Signed)
Monitor blood pressure and be in touch if consistently > 150/90 

## 2015-03-02 ENCOUNTER — Ambulatory Visit: Payer: Medicare Other

## 2015-03-02 DIAGNOSIS — R531 Weakness: Secondary | ICD-10-CM | POA: Diagnosis not present

## 2015-03-02 DIAGNOSIS — M25661 Stiffness of right knee, not elsewhere classified: Secondary | ICD-10-CM | POA: Diagnosis not present

## 2015-03-02 DIAGNOSIS — M545 Low back pain, unspecified: Secondary | ICD-10-CM

## 2015-03-02 DIAGNOSIS — M6281 Muscle weakness (generalized): Secondary | ICD-10-CM | POA: Diagnosis not present

## 2015-03-02 DIAGNOSIS — R262 Difficulty in walking, not elsewhere classified: Secondary | ICD-10-CM | POA: Diagnosis not present

## 2015-03-02 DIAGNOSIS — R29898 Other symptoms and signs involving the musculoskeletal system: Secondary | ICD-10-CM

## 2015-03-02 DIAGNOSIS — M79605 Pain in left leg: Secondary | ICD-10-CM

## 2015-03-02 NOTE — Therapy (Signed)
[facility] [address] [place], [place], [address] Phone: [phone]   Fax:  [phone]  Physical Therapy Treatment  Patient Details  Name: Kimberly [name] MRN: [id number] Date of Birth: [birth year] Referring Provider: [name]  Encounter Date: 03/02/2015      PT End of Session - 03/02/15 1135    Visit Number 11   PT Start Time 1100   PT Stop Time 1150   PT Time Calculation (min) 50 min   Activity Tolerance Patient tolerated treatment well   Behavior During Therapy [facility] for tasks assessed/performed      Past Medical History  Diagnosis Date  . Anemia 2005    hb 11.5  . Hypertension   . Hypothyroidism     off meds  . DVT (deep venous thrombosis) ([place])     2003, coumadin x 6 mon  . GERD (gastroesophageal reflux disease) 2001    no problem now  . Arthritis   . Osteopenia   . Vegetarian diet     eats fish and eggs; no meat    Past Surgical History  Procedure Laterality Date  . Oophorectomy unilateral    . Arthroscopy right knee  03/16/03  . Total knee arthroplasty Right 09/14/2014    Procedure: RIGHT TOTAL KNEE ARTHROPLASTY;  Surgeon: Garald Balding, MD;  Location: [place];  Service: Orthopedics;  Laterality: Right;    There were no vitals filed for this visit.  Visit Diagnosis:  Lumbar pain with radiation down left leg  Weakness of back  Generalized weakness      Subjective Assessment - 03/02/15 1102    Subjective Pt is ready for D/C.  Will have MRI and see Dr Durward Fortes for her back.     Currently in Pain? Yes   Pain Score 2    Pain Location Leg   Pain Orientation Left   Pain Descriptors / Indicators Aching   Pain Type Acute pain   Pain Frequency Intermittent            [facility] PT Assessment - 03/02/15 0001    Assessment   Medical Diagnosis Low back pain, osteoarthritis of lumbar sacral spine   Prior Function   Level of Independence Independent   Observation/Other Assessments    Focus on Therapeutic Outcomes (FOTO)  37% limitation                     [facility] Adult PT Treatment/Exercise - 03/02/15 0001    Lumbar Exercises: Stretches   Active Hamstring Stretch 3 reps;20 seconds  in sitting and supine using towel   Single Knee to Chest Stretch 3 reps;20 seconds   Lower Trunk Rotation 3 reps;20 seconds   Piriformis Stretch 3 reps;20 seconds  using towel   Lumbar Exercises: Aerobic   Stationary Bike Nu Step: Level 4x 8 minutes   Electrical Stimulation   Electrical Stimulation Location Lt hip   Electrical Stimulation Action IFC   Electrical Stimulation Parameters 15   Electrical Stimulation Goals Pain   Ultrasound   Ultrasound Location Lt SI joint   Ultrasound Parameters 100% 1.1w/cm2 x 8 minutes   Ultrasound Goals Pain  in Rt sidelying                  PT Short Term Goals - 02/21/15 1111    PT SHORT TERM GOAL #2   Title pain with standing for 30 minutes decreased >/= 25%   Status Achieved   PT SHORT TERM  GOAL #3   Title pain with walking decreased >/= 25%   Status Achieved           PT Long Term Goals - 03/30/2015 1104    PT LONG TERM GOAL #1   Title Independent with HEP   Status Achieved   PT LONG TERM GOAL #2   Title decreased pain with sitting >/= 75% for 1 hour   Status Partially Met  not able to sit due to pain for > 30 minutes   PT LONG TERM GOAL #3   Title stand to cook for 20 min with pain decreased >/= 75%   Status Achieved   PT LONG TERM GOAL #4   Title walking for 30 min with pain decreased >/= 75%   Status Achieved   PT LONG TERM GOAL #5   Title understand correct body mechanincs with daily tasks to decrease strain on back   Status Achieved   PT LONG TERM GOAL #6   Title return to exercising at her gym due to increased lumbar mobility   Status Not Met  not met due to fear of pain               Plan - 30-Mar-2015 1116    Clinical Impression Statement Pt reports 95% overall improvement in lumbar  symptoms since the start of care.  Pt now with Lt gluteal/LE pain that limits sitting.  Pt has HEP in place for low back and will have MRI soon to address continued leg pain.  Pt will be discharged to HEP   PT Next Visit Plan d/C to HEP    Consulted and Agree with Plan of Care Patient          G-Codes - 30-Mar-2015 1105    Functional Assessment Tool Used FOTO: 37% limitation   Functional Limitation Other PT primary   Other PT Primary Goal Status (J5701) At least 20 percent but less than 40 percent impaired, limited or restricted   Other PT Primary Discharge Status (X7939) At least 20 percent but less than 40 percent impaired, limited or restricted      Problem List Patient Active Problem List   Diagnosis Date Noted  . Osteopenia 02/28/2015  . Left lumbar radiculitis 02/28/2015  . Acute blood loss anemia 09/20/2014  . Primary osteoarthritis of right knee 09/14/2014  . Primary osteoarthritis of knee 09/14/2014  . Vitamin B 12 deficiency 07/29/2014  . Obesity (BMI 30-39.9) 12/08/2013  . Osteoarthritis, knee 05/30/2012  . Metabolic syndrome 03/00/9233  . DVT, HX OF 02/10/2010  . Vitamin D deficiency 08/25/2007  . Disorder of bone and cartilage 04/08/2007  . Hypothyroidism 11/18/2006  . ANEMIA-NOS 11/18/2006  . Essential hypertension 11/18/2006  . GERD 11/18/2006  PHYSICAL THERAPY DISCHARGE SUMMARY  Visits from Start of Care: 11  Current functional level related to goals / functional outcomes: See above for current status.     Remaining deficits: Pt with Lt LE pain that limits sitting.  LBP is 95% resolved.  Pt will have MRI soon due to leg pain.     Education / Equipment: HEP, posture/body mechanics Plan: Patient agrees to discharge.  Patient goals were partially met. Patient is being discharged due to being pleased with the current functional level.  ?????      Mccabe Gloria, PT 03-30-2015, 11:37 AM  [facility] [address] [place], [place], [address] Phone: [phone]   Fax:  [phone]  Name: Kimberly [name] MRN: [id number]  Date of Birth: 16-Mar-1951

## 2015-03-09 DIAGNOSIS — M1712 Unilateral primary osteoarthritis, left knee: Secondary | ICD-10-CM | POA: Diagnosis not present

## 2015-03-09 DIAGNOSIS — M545 Low back pain: Secondary | ICD-10-CM | POA: Diagnosis not present

## 2015-03-11 ENCOUNTER — Telehealth: Payer: Self-pay | Admitting: Family Medicine

## 2015-03-11 NOTE — Telephone Encounter (Signed)
Patient Name: Kimberly [name] DOB: [birth year] Initial Comment Caller states bp 160/80 Nurse Assessment Nurse: Ronnald Ramp, RN, Miranda Date/Time (Eastern Time): 03/11/2015 10:55:24 AM Confirm and document reason for call. If symptomatic, describe symptoms. ---Caller states her BP is 160/80, She has been noticing higher BP the last month. Has the patient traveled out of the country within the last 30 days? ---Not Applicable Does the patient have any new or worsening symptoms? ---Yes Will a triage be completed? ---Yes Related visit to physician within the last 2 weeks? ---No Does the PT have any chronic conditions? (i.e. diabetes, asthma, etc.) ---Yes List chronic conditions. ---HTN, Rehab for back pain Guidelines Guideline Title Affirmed Question Affirmed Notes High Blood Pressure [1] BP # 140/90 AND [2] taking BP medications Final Disposition User See PCP within 2 Ronny Flurry, RN, Miranda Comments No appt with Dr. Elease Hashimoto for > 1 week and pt will be out of town. She would like to see someone sooner. Appt scheduled with Dr. Regis Bill on Tuesday Nov 1st and 1130 am. Disagree/Comply: Leta Baptist

## 2015-03-11 NOTE — Telephone Encounter (Signed)
Patient speaks of intermittent mild dizziness but otherwise asymtomatic. Patient sounds anxious on phone. Educated patient to decrease stress, decrease salt intake, increase fluids, and to take it easy this weekend until appointment Tuesday. Patient verbalized understanding and agrees she feels fine to wait until Tuesday, just inquiring why BP is staying around 150-160 systaltic. Patient is aware to call office if symptoms were to arise.

## 2015-03-11 NOTE — Telephone Encounter (Signed)
Noted  

## 2015-03-15 ENCOUNTER — Encounter: Payer: Self-pay | Admitting: Internal Medicine

## 2015-03-15 ENCOUNTER — Ambulatory Visit (INDEPENDENT_AMBULATORY_CARE_PROVIDER_SITE_OTHER): Payer: Medicare Other | Admitting: Internal Medicine

## 2015-03-15 VITALS — BP 128/80 | Temp 98.3°F | Ht 61.0 in | Wt 168.8 lb

## 2015-03-15 DIAGNOSIS — I10 Essential (primary) hypertension: Secondary | ICD-10-CM

## 2015-03-15 DIAGNOSIS — R030 Elevated blood-pressure reading, without diagnosis of hypertension: Secondary | ICD-10-CM

## 2015-03-15 DIAGNOSIS — IMO0001 Reserved for inherently not codable concepts without codable children: Secondary | ICD-10-CM

## 2015-03-15 NOTE — Patient Instructions (Addendum)
Your blood pressure was good in office today .  Take blood pressure readings twice a day for 10 - 14 days.   Bring in monitor to next visit with   Your PCP or other  Provider to ensure accuracy with office machine .    Make sure you   have relaxed for 3-5 minutes and are comfortable .   Take  Second  Reading  For  Measurements.     Headaches  could be from change in caffeine  Levels .      DASH Eating Plan DASH stands for "Dietary Approaches to Stop Hypertension." The DASH eating plan is a healthy eating plan that has been shown to reduce high blood pressure (hypertension). Additional health benefits may include reducing the risk of type 2 diabetes mellitus, heart disease, and stroke. The DASH eating plan may also help with weight loss. WHAT DO I NEED TO KNOW ABOUT THE DASH EATING PLAN? For the DASH eating plan, you will follow these general guidelines:  Choose foods with a percent daily value for sodium of less than 5% (as listed on the food label).  Use salt-free seasonings or herbs instead of table salt or sea salt.  Check with your health care provider or pharmacist before using salt substitutes.  Eat lower-sodium products, often labeled as "lower sodium" or "no salt added."  Eat fresh foods.  Eat more vegetables, fruits, and low-fat dairy products.  Choose whole grains. Look for the word "whole" as the first word in the ingredient list.  Choose fish and skinless chicken or [place] more often than red meat. Limit fish, poultry, and meat to 6 oz (170 g) each day.  Limit sweets, desserts, sugars, and sugary drinks.  Choose heart-healthy fats.  Limit cheese to 1 oz (28 g) per day.  Eat more home-cooked food and less restaurant, buffet, and fast food.  Limit fried foods.  Cook foods using methods other than frying.  Limit canned vegetables. If you do use them, rinse them well to decrease the sodium.  When eating at a restaurant, ask that your food be prepared with less  salt, or no salt if possible. WHAT FOODS CAN I EAT? Seek help from a dietitian for individual calorie needs. Grains Whole grain or whole wheat bread. Brown rice. Whole grain or whole wheat pasta. Quinoa, bulgur, and whole grain cereals. Low-sodium cereals. Corn or whole wheat flour tortillas. Whole grain cornbread. Whole grain crackers. Low-sodium crackers. Vegetables Fresh or frozen vegetables (raw, steamed, roasted, or grilled). Low-sodium or reduced-sodium tomato and vegetable juices. Low-sodium or reduced-sodium tomato sauce and paste. Low-sodium or reduced-sodium canned vegetables.  Fruits All fresh, canned (in natural juice), or frozen fruits. Meat and Other Protein Products Ground beef (85% or leaner), grass-fed beef, or beef trimmed of fat. Skinless chicken or [place]. Ground chicken or [place]. Pork trimmed of fat. All fish and seafood. Eggs. Dried beans, peas, or lentils. Unsalted nuts and seeds. Unsalted canned beans. Dairy Low-fat dairy products, such as skim or 1% milk, 2% or reduced-fat cheeses, low-fat ricotta or cottage cheese, or plain low-fat yogurt. Low-sodium or reduced-sodium cheeses. Fats and Oils Tub margarines without trans fats. Light or reduced-fat mayonnaise and salad dressings (reduced sodium). Avocado. Safflower, olive, or canola oils. Natural peanut or almond butter. Other Unsalted popcorn and pretzels. The items listed above may not be a complete list of recommended foods or beverages. Contact your dietitian for more options. WHAT FOODS ARE NOT RECOMMENDED? Grains White bread. White pasta. White  rice. Refined cornbread. Bagels and croissants. Crackers that contain trans fat. Vegetables Creamed or fried vegetables. Vegetables in a cheese sauce. Regular canned vegetables. Regular canned tomato sauce and paste. Regular tomato and vegetable juices. Fruits Dried fruits. Canned fruit in light or heavy syrup. Fruit juice. Meat and Other Protein Products Fatty cuts of  meat. Ribs, chicken wings, bacon, sausage, bologna, salami, chitterlings, fatback, hot dogs, bratwurst, and packaged luncheon meats. Salted nuts and seeds. Canned beans with salt. Dairy Whole or 2% milk, cream, half-and-half, and cream cheese. Whole-fat or sweetened yogurt. Full-fat cheeses or blue cheese. Nondairy creamers and whipped toppings. Processed cheese, cheese spreads, or cheese curds. Condiments Onion and garlic salt, seasoned salt, table salt, and sea salt. Canned and packaged gravies. Worcestershire sauce. Tartar sauce. Barbecue sauce. Teriyaki sauce. Soy sauce, including reduced sodium. Steak sauce. Fish sauce. Oyster sauce. Cocktail sauce. Horseradish. Ketchup and mustard. Meat flavorings and tenderizers. Bouillon cubes. Hot sauce. Tabasco sauce. Marinades. Taco seasonings. Relishes. Fats and Oils Butter, stick margarine, lard, shortening, ghee, and bacon fat. Coconut, palm kernel, or palm oils. Regular salad dressings. Other Pickles and olives. Salted popcorn and pretzels. The items listed above may not be a complete list of foods and beverages to avoid. Contact your dietitian for more information. WHERE CAN I FIND MORE INFORMATION? National Heart, Lung, and Blood Institute: travelstabloid.com   This information is not intended to replace advice given to you by your health care provider. Make sure you discuss any questions you have with your health care provider.   Document Released: 04/19/2011 Document Revised: 05/21/2014 Document Reviewed: 03/04/2013 Elsevier Interactive Patient Education 2016 [employer].   How to Take Your Blood Pressure HOW DO I GET A BLOOD PRESSURE MACHINE?  You can buy an electronic home blood pressure machine at your local pharmacy. Insurance will sometimes cover the cost if you have a prescription.  Ask your doctor what type of machine is best for you. There are different machines for your arm and your wrist.  If  you decide to buy a machine to check your blood pressure on your arm, first check the size of your arm so you can buy the right size cuff. To check the size of your arm:   Use a measuring tape that shows both inches and centimeters.   Wrap the measuring tape around the upper-middle part of your arm. You may need someone to help you measure.   Write down your arm measurement in both inches and centimeters.   To measure your blood pressure correctly, it is important to have the right size cuff.   If your arm is up to 13 inches (up to 34 centimeters), get an adult cuff size.  If your arm is 13 to 17 inches (35 to 44 centimeters), get a large adult cuff size.    If your arm is 17 to 20 inches (45 to 52 centimeters), get an adult thigh cuff.  WHAT DO THE NUMBERS MEAN?   There are two numbers that make up your blood pressure. For example: 120/80.  The first number (120 in our example) is called the "systolic pressure." It is a measure of the pressure in your blood vessels when your heart is pumping blood.  The second number (80 in our example) is called the "diastolic pressure." It is a measure of the pressure in your blood vessels when your heart is resting between beats.  Your doctor will tell you what your blood pressure should be. WHAT SHOULD I DO  BEFORE I CHECK MY BLOOD PRESSURE?   Try to rest or relax for at least 30 minutes before you check your blood pressure.  Do not smoke.  Do not have any drinks with caffeine, such as:  Soda.  Coffee.  Tea.  Check your blood pressure in a quiet room.  Sit down and stretch out your arm on a table. Keep your arm at about the level of your heart. Let your arm relax.  Make sure that your legs are not crossed. HOW DO I CHECK MY BLOOD PRESSURE?  Follow the directions that came with your machine.  Make sure you remove any tight-fitting clothing from your arm or wrist. Wrap the cuff around your upper arm or wrist. You should be able  to fit a finger between the cuff and your arm. If you cannot fit a finger between the cuff and your arm, it is too tight and should be removed and rewrapped.  Some units require you to manually pump up the arm cuff.  Automatic units inflate the cuff when you press a button.  Cuff deflation is automatic in both models.  After the cuff is inflated, the unit measures your blood pressure and pulse. The readings are shown on a monitor. Hold still and breathe normally while the cuff is inflated.  Getting a reading takes less than a minute.  Some models store readings in a memory. Some provide a printout of readings. If your machine does not store your readings, keep a written record.  Take readings with you to your next visit with your doctor.   This information is not intended to replace advice given to you by your health care provider. Make sure you discuss any questions you have with your health care provider.   Document Released: 04/12/2008 Document Revised: 05/21/2014 Document Reviewed: 06/25/2013 Elsevier Interactive Patient Education [employer].

## 2015-03-15 NOTE — Progress Notes (Signed)
Pre visit review using our clinic review tool, if applicable. No additional management support is needed unless otherwise documented below in the visit note.  Chief Complaint  Patient presents with  . Elevated BP Readings    150s/headache this morning    HPI: Patient Kimberly [name]  comes in today for SDA for  new problem evaluation. PCP NA until next week and patient felt  Concern about BP readings to come in urgently . As she is going out of town to [place]  For family.  Her bp had been good in past on meds   Told to get machine and got one  Machine   Digital on arm .  Getting  150 s . Until this am   This am  And had bad headache   And was ok.  130/70  Tea and one tylenol.  Does yoga  In am   . Checked some readings in between many  Activities   And once was 165   On BP  meds  For about 10 + years.   At knee replacement  And bp was good . At that time    Has tooth pain  And headache  alos could be a problem but no fever ...  Check left ear feels clogged a bit  No pain    nsaid  In past  400 ibuprofen once or 2 per day  For   Back pain .    Weeks ago     Lots of tea  ...   Changed from black to green today  ROS: See pertinent positives and negatives per HPI.no neuro sx    stong family hx of ht  Past Medical History  Diagnosis Date  . Anemia 2005    hb 11.5  . Hypertension   . Hypothyroidism     off meds  . DVT (deep venous thrombosis) ([place])     2003, coumadin x 6 mon  . GERD (gastroesophageal reflux disease) 2001    no problem now  . Arthritis   . Osteopenia   . Vegetarian diet     eats fish and eggs; no meat    Family History  Problem Relation Age of Onset  . Diabetes Other   . Alcohol abuse Other   . Diabetes Mother   . Heart failure Father     Social History   Social History  . Marital Status: Married    Spouse Name: N/A  . Number of Children: N/A  . Years of Education: N/A   Social History Main Topics  . Smoking status: Never Smoker   . Smokeless  tobacco: Never Used  . Alcohol Use: No  . Drug Use: No  . Sexual Activity: Not Asked   Other Topics Concern  . None   Social History Narrative   Married   Never smoker   Alcohol use-no   Regular exercise- yes. Walks regularly   Brother-alcohol   Illicit drug- no   Daily Caffeine use- 2 cups of tea daily    Outpatient Prescriptions Prior to Visit  Medication Sig Dispense Refill  . calcium carbonate (OS-CAL) 600 MG TABS Take 600 mg by mouth 2 (two) times daily with a meal.    . cholecalciferol (VITAMIN D) 1000 UNITS tablet Take 1,000 Units by mouth daily.      . irbesartan (AVAPRO) 150 MG tablet TAKE 1 TABLET EVERY DAY 90 tablet 3  . levothyroxine (SYNTHROID, LEVOTHROID) 50 MCG tablet TAKE ONE TABLET BY MOUTH  ONCE DAILY 90 tablet 3   No facility-administered medications prior to visit.     EXAM:  BP 128/80 mmHg  Temp(Src) 98.3 F (36.8 C) (Oral)  Ht 5\' 1"  (1.549 m)  Wt 168 lb 12.8 oz (76.567 kg)  BMI 31.91 kg/m2  LMP 05/14/1998  Body mass index is 31.91 kg/(m^2).  GENERAL: vitals reviewed and listed above, alert, oriented, appears well hydrated and in no acute distress HEENT: atraumatic, conjunctiva  clear, no obvious abnormalities on inspection of external nose and ears   Left tm retracted nl acute findings  Right nnad  NECK: no obvious masses on inspection palpation  No bruit CV: HRRR, no clubbing cyanosis or  peripheral edema nl cap refill    Re check bp readings  MS: moves all extremities without noticeable focal  abnormality PSYCH: pleasant and cooperative, no obvious depression  mildly anxious disc bp  Neuro grossly non focal  BP Readings from Last 3 Encounters:  03/15/15 128/80  02/28/15 150/80  12/01/14 126/80    BP Readings from Last 3 Encounters:  03/15/15 128/80  02/28/15 150/80  12/01/14 126/80    ASSESSMENT AND PLAN:  Discussed the following assessment and plan:  Essential hypertension - seems controlled   Elevated BP - disc readings and  how to take  after resting . no change med today rov dr B with machine and readings unless 160 and over at homeavoid nsaid  To go to [place] dec 16  Fu visit before this  HO on dash eating  And bp goal and control  -Patient advised to return or notify health care team  if symptoms worsen ,persist or new concerns arise.   Patient Instructions   Your blood pressure was good in office today .  Take blood pressure readings twice a day for 10 - 14 days.   Bring in monitor to next visit with   Your PCP or other  Provider to ensure accuracy with office machine .    Make sure you   have relaxed for 3-5 minutes and are comfortable .   Take  Second  Reading  For  Measurements.     Headaches  could be from change in caffeine  Levels .      DASH Eating Plan DASH stands for "Dietary Approaches to Stop Hypertension." The DASH eating plan is a healthy eating plan that has been shown to reduce high blood pressure (hypertension). Additional health benefits may include reducing the risk of type 2 diabetes mellitus, heart disease, and stroke. The DASH eating plan may also help with weight loss. WHAT DO I NEED TO KNOW ABOUT THE DASH EATING PLAN? For the DASH eating plan, you will follow these general guidelines:  Choose foods with a percent daily value for sodium of less than 5% (as listed on the food label).  Use salt-free seasonings or herbs instead of table salt or sea salt.  Check with your health care provider or pharmacist before using salt substitutes.  Eat lower-sodium products, often labeled as "lower sodium" or "no salt added."  Eat fresh foods.  Eat more vegetables, fruits, and low-fat dairy products.  Choose whole grains. Look for the word "whole" as the first word in the ingredient list.  Choose fish and skinless chicken or [place] more often than red meat. Limit fish, poultry, and meat to 6 oz (170 g) each day.  Limit sweets, desserts, sugars, and sugary drinks.  Choose heart-healthy  fats.  Limit cheese to 1 oz (28 g)  per day.  Eat more home-cooked food and less restaurant, buffet, and fast food.  Limit fried foods.  Cook foods using methods other than frying.  Limit canned vegetables. If you do use them, rinse them well to decrease the sodium.  When eating at a restaurant, ask that your food be prepared with less salt, or no salt if possible. WHAT FOODS CAN I EAT? Seek help from a dietitian for individual calorie needs. Grains Whole grain or whole wheat bread. Brown rice. Whole grain or whole wheat pasta. Quinoa, bulgur, and whole grain cereals. Low-sodium cereals. Corn or whole wheat flour tortillas. Whole grain cornbread. Whole grain crackers. Low-sodium crackers. Vegetables Fresh or frozen vegetables (raw, steamed, roasted, or grilled). Low-sodium or reduced-sodium tomato and vegetable juices. Low-sodium or reduced-sodium tomato sauce and paste. Low-sodium or reduced-sodium canned vegetables.  Fruits All fresh, canned (in natural juice), or frozen fruits. Meat and Other Protein Products Ground beef (85% or leaner), grass-fed beef, or beef trimmed of fat. Skinless chicken or [place]. Ground chicken or [place]. Pork trimmed of fat. All fish and seafood. Eggs. Dried beans, peas, or lentils. Unsalted nuts and seeds. Unsalted canned beans. Dairy Low-fat dairy products, such as skim or 1% milk, 2% or reduced-fat cheeses, low-fat ricotta or cottage cheese, or plain low-fat yogurt. Low-sodium or reduced-sodium cheeses. Fats and Oils Tub margarines without trans fats. Light or reduced-fat mayonnaise and salad dressings (reduced sodium). Avocado. Safflower, olive, or canola oils. Natural peanut or almond butter. Other Unsalted popcorn and pretzels. The items listed above may not be a complete list of recommended foods or beverages. Contact your dietitian for more options. WHAT FOODS ARE NOT RECOMMENDED? Grains White bread. White pasta. White rice. Refined cornbread.  Bagels and croissants. Crackers that contain trans fat. Vegetables Creamed or fried vegetables. Vegetables in a cheese sauce. Regular canned vegetables. Regular canned tomato sauce and paste. Regular tomato and vegetable juices. Fruits Dried fruits. Canned fruit in light or heavy syrup. Fruit juice. Meat and Other Protein Products Fatty cuts of meat. Ribs, chicken wings, bacon, sausage, bologna, salami, chitterlings, fatback, hot dogs, bratwurst, and packaged luncheon meats. Salted nuts and seeds. Canned beans with salt. Dairy Whole or 2% milk, cream, half-and-half, and cream cheese. Whole-fat or sweetened yogurt. Full-fat cheeses or blue cheese. Nondairy creamers and whipped toppings. Processed cheese, cheese spreads, or cheese curds. Condiments Onion and garlic salt, seasoned salt, table salt, and sea salt. Canned and packaged gravies. Worcestershire sauce. Tartar sauce. Barbecue sauce. Teriyaki sauce. Soy sauce, including reduced sodium. Steak sauce. Fish sauce. Oyster sauce. Cocktail sauce. Horseradish. Ketchup and mustard. Meat flavorings and tenderizers. Bouillon cubes. Hot sauce. Tabasco sauce. Marinades. Taco seasonings. Relishes. Fats and Oils Butter, stick margarine, lard, shortening, ghee, and bacon fat. Coconut, palm kernel, or palm oils. Regular salad dressings. Other Pickles and olives. Salted popcorn and pretzels. The items listed above may not be a complete list of foods and beverages to avoid. Contact your dietitian for more information. WHERE CAN I FIND MORE INFORMATION? National Heart, Lung, and Blood Institute: travelstabloid.com   This information is not intended to replace advice given to you by your health care provider. Make sure you discuss any questions you have with your health care provider.   Document Released: 04/19/2011 Document Revised: 05/21/2014 Document Reviewed: 03/04/2013 Elsevier Interactive Patient Education 2016 [employer].   How to Take Your Blood Pressure HOW DO I GET A BLOOD PRESSURE MACHINE?  You can buy an electronic home blood pressure machine at your  local pharmacy. Insurance will sometimes cover the cost if you have a prescription.  Ask your doctor what type of machine is best for you. There are different machines for your arm and your wrist.  If you decide to buy a machine to check your blood pressure on your arm, first check the size of your arm so you can buy the right size cuff. To check the size of your arm:   Use a measuring tape that shows both inches and centimeters.   Wrap the measuring tape around the upper-middle part of your arm. You may need someone to help you measure.   Write down your arm measurement in both inches and centimeters.   To measure your blood pressure correctly, it is important to have the right size cuff.   If your arm is up to 13 inches (up to 34 centimeters), get an adult cuff size.  If your arm is 13 to 17 inches (35 to 44 centimeters), get a large adult cuff size.    If your arm is 17 to 20 inches (45 to 52 centimeters), get an adult thigh cuff.  WHAT DO THE NUMBERS MEAN?   There are two numbers that make up your blood pressure. For example: 120/80.  The first number (120 in our example) is called the "systolic pressure." It is a measure of the pressure in your blood vessels when your heart is pumping blood.  The second number (80 in our example) is called the "diastolic pressure." It is a measure of the pressure in your blood vessels when your heart is resting between beats.  Your doctor will tell you what your blood pressure should be. WHAT SHOULD I DO BEFORE I CHECK MY BLOOD PRESSURE?   Try to rest or relax for at least 30 minutes before you check your blood pressure.  Do not smoke.  Do not have any drinks with caffeine, such as:  Soda.  Coffee.  Tea.  Check your blood pressure in a quiet room.  Sit down and stretch out your arm  on a table. Keep your arm at about the level of your heart. Let your arm relax.  Make sure that your legs are not crossed. HOW DO I CHECK MY BLOOD PRESSURE?  Follow the directions that came with your machine.  Make sure you remove any tight-fitting clothing from your arm or wrist. Wrap the cuff around your upper arm or wrist. You should be able to fit a finger between the cuff and your arm. If you cannot fit a finger between the cuff and your arm, it is too tight and should be removed and rewrapped.  Some units require you to manually pump up the arm cuff.  Automatic units inflate the cuff when you press a button.  Cuff deflation is automatic in both models.  After the cuff is inflated, the unit measures your blood pressure and pulse. The readings are shown on a monitor. Hold still and breathe normally while the cuff is inflated.  Getting a reading takes less than a minute.  Some models store readings in a memory. Some provide a printout of readings. If your machine does not store your readings, keep a written record.  Take readings with you to your next visit with your doctor.   This information is not intended to replace advice given to you by your health care provider. Make sure you discuss any questions you have with your health care provider.   Document Released: 04/12/2008 Document Revised:  05/21/2014 Document Reviewed: 06/25/2013 Elsevier Interactive Patient Education 2016 Mojave Panosh M.D.

## 2015-04-04 DIAGNOSIS — J310 Chronic rhinitis: Secondary | ICD-10-CM | POA: Diagnosis not present

## 2015-04-20 DIAGNOSIS — E049 Nontoxic goiter, unspecified: Secondary | ICD-10-CM | POA: Diagnosis not present

## 2015-04-20 DIAGNOSIS — R7301 Impaired fasting glucose: Secondary | ICD-10-CM | POA: Diagnosis not present

## 2015-04-20 DIAGNOSIS — E039 Hypothyroidism, unspecified: Secondary | ICD-10-CM | POA: Diagnosis not present

## 2015-04-21 ENCOUNTER — Encounter: Payer: Self-pay | Admitting: Family Medicine

## 2015-04-21 ENCOUNTER — Ambulatory Visit (INDEPENDENT_AMBULATORY_CARE_PROVIDER_SITE_OTHER): Payer: Medicare Other | Admitting: Family Medicine

## 2015-04-21 VITALS — BP 124/78 | HR 75 | Temp 98.1°F | Resp 16 | Ht 61.0 in | Wt 169.9 lb

## 2015-04-21 DIAGNOSIS — M545 Low back pain, unspecified: Secondary | ICD-10-CM

## 2015-04-21 DIAGNOSIS — I10 Essential (primary) hypertension: Secondary | ICD-10-CM

## 2015-04-21 MED ORDER — METHOCARBAMOL 500 MG PO TABS: 500.0000 mg | ORAL_TABLET | Freq: Three times a day (TID) | ORAL | Status: DC | PRN

## 2015-04-21 NOTE — Progress Notes (Signed)
   Subjective:    Patient ID: Kimberly [name], female    DOB: 02/24/1951, 64 y.o.   MRN: [id number]  HPI  Follow-up hypertension. Patient's had several home readings 150s recently. She was here early November and at that time blood pressure stable. She takes low-dose Avapro 150 mg once daily. No headaches. No dizziness. Blood pressures mostly [id number] systolic over the past few weeks. No regular alcohol use. Other medical problem is occluded hypothyroidism and type 2 diabetes followed by endocrinology  Recent right low lumbar back pain over the past few days. No radiculopathy symptoms. No specific injury. No radiation. Dull ache which is worse with movement. She had some leftover Robaxin which seems to have helped. Also heat helps. No lower extremity weakness or numbness. No urine or stool incontinence.  Past Medical History  Diagnosis Date  . Anemia 2005    hb 11.5  . Hypertension   . Hypothyroidism     off meds  . DVT (deep venous thrombosis) ([place])     2003, coumadin x 6 mon  . GERD (gastroesophageal reflux disease) 2001    no problem now  . Arthritis   . Osteopenia   . Vegetarian diet     eats fish and eggs; no meat   Past Surgical History  Procedure Laterality Date  . Oophorectomy unilateral    . Arthroscopy right knee  03/16/03  . Total knee arthroplasty Right 09/14/2014    Procedure: RIGHT TOTAL KNEE ARTHROPLASTY;  Surgeon: Garald Balding, MD;  Location: [facility];  Service: Orthopedics;  Laterality: Right;    reports that she has never smoked. She has never used smokeless tobacco. She reports that she does not drink alcohol or use illicit drugs. family history includes Alcohol abuse in her other; Diabetes in her mother and other; Heart failure in her father. Allergies  Allergen Reactions  . Enoxaparin Sodium     REACTION: itching,swelling      Review of Systems  Constitutional: Negative for fever, chills, appetite change, fatigue and unexpected weight change.  Eyes: Negative  for visual disturbance.  Respiratory: Negative for cough, chest tightness, shortness of breath and wheezing.   Cardiovascular: Negative for chest pain, palpitations and leg swelling.  Genitourinary: Negative for dysuria.  Musculoskeletal: Positive for back pain.  Neurological: Negative for dizziness, seizures, syncope, weakness, light-headedness and headaches.       Objective:   Physical Exam  Constitutional: She appears well-developed and well-nourished.  Neck: Neck supple. No thyromegaly present.  Cardiovascular: Normal rate and regular rhythm.   Pulmonary/Chest: Breath sounds normal. No respiratory distress. She has no wheezes. She has no rales.  Musculoskeletal: She exhibits no edema.  Mild tenderness right lower lumbar region. No spinal tenderness. Straight leg raises are negative.  Neurological:  Full-strength lower extremities.          Assessment & Plan:  #1 hypertension. Stable and at goal. Repeat blood pressure by me left arm seated 124/78.  Continue Avapro 150 mg once daily. She is encouraged to lose some weight. Watch sodium intake. Recent information on DASH diet given #2 low back pain. Suspect muscular. Nonfocal exam. Refill Robaxin for as needed use. Try heat or ice for symptom relief. Consider physical therapy if not improving over the next couple weeks

## 2015-04-21 NOTE — Progress Notes (Signed)
Pre visit review using our clinic review tool, if applicable. No additional management support is needed unless otherwise documented below in the visit note. 

## 2015-04-25 ENCOUNTER — Encounter: Payer: Self-pay | Admitting: Family Medicine

## 2015-04-25 ENCOUNTER — Ambulatory Visit (INDEPENDENT_AMBULATORY_CARE_PROVIDER_SITE_OTHER): Payer: Medicare Other | Admitting: Family Medicine

## 2015-04-25 ENCOUNTER — Ambulatory Visit: Payer: Medicare Other | Admitting: Family Medicine

## 2015-04-25 VITALS — BP 120/80 | HR 83 | Temp 98.2°F | Resp 14 | Ht 61.0 in | Wt 169.7 lb

## 2015-04-25 DIAGNOSIS — M545 Low back pain, unspecified: Secondary | ICD-10-CM

## 2015-04-25 MED ORDER — PREDNISONE 10 MG PO TABS: ORAL_TABLET | ORAL | Status: DC

## 2015-04-25 NOTE — Progress Notes (Signed)
Pre visit review using our clinic review tool, if applicable. No additional management support is needed unless otherwise documented below in the visit note. 

## 2015-04-25 NOTE — Progress Notes (Signed)
   Subjective:    Patient ID: Ratzy Kingsmore, female    DOB: 1950/05/16, 64 y.o.   MRN: [id number]  HPI Patient seen with persistent low back pain. Refer to prior note Previously right sided now mid lumbar. No radiculopathy symptoms. Denies any numbness or weakness. Pain is worse with prolonged sitting or change of position has tried tramadol and Tylenol without relief. Has previously taken meloxicam without relief.  No associated urinary symptoms. No fever or chills. No appetite or weight changes. Upcoming travel to [place] leaving tomorrow Has taken prednisone with improvement in the past. Was just started on metformin but recent A1c 5.7%  Past Medical History  Diagnosis Date  . Anemia 2005    hb 11.5  . Hypertension   . Hypothyroidism     off meds  . DVT (deep venous thrombosis) ([place])     2003, coumadin x 6 mon  . GERD (gastroesophageal reflux disease) 2001    no problem now  . Arthritis   . Osteopenia   . Vegetarian diet     eats fish and eggs; no meat   Past Surgical History  Procedure Laterality Date  . Oophorectomy unilateral    . Arthroscopy right knee  03/16/03  . Total knee arthroplasty Right 09/14/2014    Procedure: RIGHT TOTAL KNEE ARTHROPLASTY;  Surgeon: Garald Balding, MD;  Location: [facility];  Service: Orthopedics;  Laterality: Right;    reports that she has never smoked. She has never used smokeless tobacco. She reports that she does not drink alcohol or use illicit drugs. family history includes Alcohol abuse in her other; Diabetes in her mother and other; Heart failure in her father. Allergies  Allergen Reactions  . Enoxaparin Sodium     REACTION: itching,swelling       Review of Systems  Constitutional: Negative for fever, chills, appetite change and unexpected weight change.  Respiratory: Negative for shortness of breath.   Cardiovascular: Negative for chest pain.  Gastrointestinal: Negative for abdominal pain.  Genitourinary: Negative for dysuria.    Musculoskeletal: Positive for back pain.  Neurological: Negative for weakness and numbness.       Objective:   Physical Exam  Constitutional: She appears well-developed and well-nourished.  Cardiovascular: Normal rate and regular rhythm.   Pulmonary/Chest: Effort normal and breath sounds normal. No respiratory distress. She has no wheezes. She has no rales.  Musculoskeletal:  Straight leg raise is negative bilaterally. No lower extremity edema. Pain with back flexion to around 90 and back extension  Neurological:  No focal weakness lower extremities          Assessment & Plan:   Midline low back pain. Nonfocal exam neurologically. Suspect muscular. Cautious trial of prednisone and monitor blood sugars closely. Low glycemic diet. Continue Robaxin at night as needed. Consider physical therapy if still has back pain when she returns

## 2015-06-28 ENCOUNTER — Telehealth: Payer: Self-pay

## 2015-06-28 NOTE — Telephone Encounter (Signed)
Pt states she is going out of town this week and not sure when they will return. Surgery is scheduled for May 2.  Pt states she will call back to make appointment . I will keep this note open and follow up if I do not hear from pt.

## 2015-06-28 NOTE — Telephone Encounter (Signed)
Please schedule pt at 30 minute visit for pre op clearance for LT total knee replacement. Please sent back to me once finished. Thanks.

## 2015-08-05 ENCOUNTER — Ambulatory Visit (INDEPENDENT_AMBULATORY_CARE_PROVIDER_SITE_OTHER): Payer: Medicare Other | Admitting: Family Medicine

## 2015-08-05 VITALS — BP 130/88 | HR 78 | Temp 97.9°F | Ht 61.0 in | Wt 166.3 lb

## 2015-08-05 DIAGNOSIS — Z01818 Encounter for other preprocedural examination: Secondary | ICD-10-CM | POA: Diagnosis not present

## 2015-08-05 DIAGNOSIS — R197 Diarrhea, unspecified: Secondary | ICD-10-CM | POA: Diagnosis not present

## 2015-08-05 MED ORDER — AZITHROMYCIN 1 G PO PACK: 1.0000 g | PACK | Freq: Once | ORAL | Status: DC

## 2015-08-05 NOTE — Progress Notes (Signed)
Pre visit review using our clinic review tool, if applicable. No additional management support is needed unless otherwise documented below in the visit note. 

## 2015-08-05 NOTE — Patient Instructions (Signed)

## 2015-08-05 NOTE — Progress Notes (Signed)
   Subjective:    Patient ID: Kimberly [name], female    DOB: May 22, 1950, 65 y.o.   MRN: [id number]  HPI Here for the following issues  Surgical clearance for left total knee replacement. She had right total knee replacement a year ago. She has no cardiac history. No recent chest pains. Her medical problems include history of vitamin D deficiency, B12 deficiency, osteoarthritis, metabolic syndrome, hypothyroidism. She has no cardiopulmonary problems. Nonsmoker. No recent dyspnea  Diarrhea. Patient just returned from [place]. She did get some dental work there and states she had couple of doses of antibiotic pre-procedure.   She's had some diffuse abdominal cramps and is having about 2-3 loose stools per day. No bloody stools. No fever. Slightly diminished appetite.  Past Medical History  Diagnosis Date  . Anemia 2005    hb 11.5  . Hypertension   . Hypothyroidism     off meds  . DVT (deep venous thrombosis) ([place])     2003, coumadin x 6 mon  . GERD (gastroesophageal reflux disease) 2001    no problem now  . Arthritis   . Osteopenia   . Vegetarian diet     eats fish and eggs; no meat   Past Surgical History  Procedure Laterality Date  . Oophorectomy unilateral    . Arthroscopy right knee  03/16/03  . Total knee arthroplasty Right 09/14/2014    Procedure: RIGHT TOTAL KNEE ARTHROPLASTY;  Surgeon: Garald Balding, MD;  Location: [facility];  Service: Orthopedics;  Laterality: Right;    reports that she has never smoked. She has never used smokeless tobacco. She reports that she does not drink alcohol or use illicit drugs. family history includes Alcohol abuse in her other; Diabetes in her mother and other; Heart failure in her father. Allergies  Allergen Reactions  . Enoxaparin Sodium     REACTION: itching,swelling      Review of Systems  Constitutional: Negative for fever, chills and fatigue.  Eyes: Negative for visual disturbance.  Respiratory: Negative for cough, chest tightness,  shortness of breath and wheezing.   Cardiovascular: Negative for chest pain, palpitations and leg swelling.  Gastrointestinal: Positive for abdominal pain and diarrhea. Negative for nausea, vomiting, constipation, blood in stool and rectal pain.  Neurological: Negative for dizziness, seizures, syncope, weakness, light-headedness and headaches.       Objective:   Physical Exam  Constitutional: She appears well-developed and well-nourished.  HENT:  Mouth/Throat: Oropharynx is clear and moist.  Eyes: Pupils are equal, round, and reactive to light.  Neck: Neck supple. No JVD present. No thyromegaly present.  Cardiovascular: Normal rate and regular rhythm.  Exam reveals no gallop.   Pulmonary/Chest: Effort normal and breath sounds normal. No respiratory distress. She has no wheezes. She has no rales.  Abdominal: Soft. There is no tenderness.  Musculoskeletal: She exhibits no edema.  Neurological: She is alert.          Assessment & Plan:  #1 diarrhea. Patient does not appear toxic or dehydrated. Doubt C. difficile. Question traveler's diarrhea. We'll treat with 1 g dose of azithromycin (recent travel as above to [place]). If diarrhea not resolving by next week consider C. difficile PCR  #2 presurgical clearance. EKG shows sinus rhythm with no acute ST- T changes. No medical contraindications for surgery.

## 2015-08-09 ENCOUNTER — Telehealth: Payer: Self-pay | Admitting: Family Medicine

## 2015-08-09 DIAGNOSIS — R197 Diarrhea, unspecified: Secondary | ICD-10-CM

## 2015-08-09 NOTE — Telephone Encounter (Signed)
Pt call to say she is still having diarrhea and does not feel like eating anything. Pt said she was told to call back if it continued and some type of test would be ordered.

## 2015-08-10 MED ORDER — METRONIDAZOLE 500 MG PO TABS: 500.0000 mg | ORAL_TABLET | Freq: Three times a day (TID) | ORAL | Status: DC

## 2015-08-10 NOTE — Telephone Encounter (Signed)
Patient needs to get stool test for C. Difficile-order already placed.  Would also go ahead and start empirically metronidazole 500 mg 3 times a day for 10 days

## 2015-08-10 NOTE — Telephone Encounter (Signed)
Pt is aware that order has been placed and medication has been sent in.

## 2015-08-12 ENCOUNTER — Other Ambulatory Visit: Payer: Medicare Other

## 2015-08-12 DIAGNOSIS — R195 Other fecal abnormalities: Secondary | ICD-10-CM

## 2015-08-13 LAB — CLOSTRIDIUM DIFFICILE BY PCR: Toxigenic C. Difficile by PCR: NOT DETECTED

## 2015-08-14 ENCOUNTER — Encounter: Payer: Self-pay | Admitting: Family Medicine

## 2015-08-25 ENCOUNTER — Other Ambulatory Visit (HOSPITAL_COMMUNITY): Payer: Self-pay | Admitting: Orthopaedic Surgery

## 2015-08-25 ENCOUNTER — Ambulatory Visit (HOSPITAL_COMMUNITY)
Admission: RE | Admit: 2015-08-25 | Discharge: 2015-08-25 | Disposition: A | Discharge status: Home / Self Care | Payer: Medicare Other | Source: Ambulatory Visit | Attending: Orthopaedic Surgery | Admitting: Orthopaedic Surgery

## 2015-08-25 DIAGNOSIS — M7989 Other specified soft tissue disorders: Secondary | ICD-10-CM | POA: Insufficient documentation

## 2015-08-25 DIAGNOSIS — M79605 Pain in left leg: Secondary | ICD-10-CM | POA: Diagnosis not present

## 2015-08-25 DIAGNOSIS — M1712 Unilateral primary osteoarthritis, left knee: Secondary | ICD-10-CM | POA: Diagnosis not present

## 2015-08-25 DIAGNOSIS — M79662 Pain in left lower leg: Secondary | ICD-10-CM

## 2015-08-25 DIAGNOSIS — M25551 Pain in right hip: Secondary | ICD-10-CM | POA: Diagnosis not present

## 2015-08-25 NOTE — Progress Notes (Signed)
*  PRELIMINARY RESULTS* Vascular Ultrasound Left lower extremity venous duplex has been completed.  Preliminary findings: No evidence of DVT or baker's cyst.   Called results to Dr. Durward Fortes.  Landry Mellow, RDMS, RVT  08/25/2015, 4:28 PM

## 2015-09-07 DIAGNOSIS — M1712 Unilateral primary osteoarthritis, left knee: Secondary | ICD-10-CM | POA: Diagnosis not present

## 2015-09-07 DIAGNOSIS — M545 Low back pain: Secondary | ICD-10-CM | POA: Diagnosis not present

## 2015-09-20 ENCOUNTER — Inpatient Hospital Stay: Admission: RE | Admit: 2015-09-20 | Payer: Medicare Other | Source: Ambulatory Visit | Admitting: Orthopaedic Surgery

## 2015-09-20 ENCOUNTER — Encounter: Admission: RE | Payer: Self-pay | Source: Ambulatory Visit

## 2015-09-20 SURGERY — ARTHROPLASTY, KNEE, TOTAL
Anesthesia: Choice | Laterality: Left

## 2015-12-21 DIAGNOSIS — M85462 Solitary bone cyst, left tibia and fibula: Secondary | ICD-10-CM | POA: Diagnosis not present

## 2015-12-21 DIAGNOSIS — M25572 Pain in left ankle and joints of left foot: Secondary | ICD-10-CM | POA: Diagnosis not present

## 2015-12-22 ENCOUNTER — Other Ambulatory Visit: Payer: Self-pay | Admitting: Orthopaedic Surgery

## 2015-12-22 DIAGNOSIS — M25572 Pain in left ankle and joints of left foot: Secondary | ICD-10-CM

## 2015-12-27 DIAGNOSIS — M25572 Pain in left ankle and joints of left foot: Secondary | ICD-10-CM | POA: Diagnosis not present

## 2015-12-27 DIAGNOSIS — M949 Disorder of cartilage, unspecified: Secondary | ICD-10-CM | POA: Diagnosis not present

## 2015-12-27 DIAGNOSIS — M899 Disorder of bone, unspecified: Secondary | ICD-10-CM | POA: Diagnosis not present

## 2016-01-02 ENCOUNTER — Other Ambulatory Visit: Payer: Medicare Other

## 2016-01-04 ENCOUNTER — Ambulatory Visit
Admission: RE | Admit: 2016-01-04 | Discharge: 2016-01-04 | Disposition: A | Discharge status: Home / Self Care | Payer: Medicare Other | Source: Ambulatory Visit | Attending: Orthopaedic Surgery | Admitting: Orthopaedic Surgery

## 2016-01-04 DIAGNOSIS — M25572 Pain in left ankle and joints of left foot: Secondary | ICD-10-CM

## 2016-01-09 DIAGNOSIS — M25572 Pain in left ankle and joints of left foot: Secondary | ICD-10-CM | POA: Diagnosis not present

## 2016-02-03 ENCOUNTER — Other Ambulatory Visit: Payer: Self-pay | Admitting: Family Medicine

## 2016-02-07 ENCOUNTER — Encounter (INDEPENDENT_AMBULATORY_CARE_PROVIDER_SITE_OTHER): Payer: Self-pay

## 2016-02-07 DIAGNOSIS — M19072 Primary osteoarthritis, left ankle and foot: Secondary | ICD-10-CM | POA: Diagnosis not present

## 2016-02-09 ENCOUNTER — Ambulatory Visit: Payer: Medicare Other | Attending: Orthopaedic Surgery | Admitting: Physical Therapy

## 2016-02-09 DIAGNOSIS — M25672 Stiffness of left ankle, not elsewhere classified: Secondary | ICD-10-CM | POA: Diagnosis not present

## 2016-02-09 DIAGNOSIS — M25662 Stiffness of left knee, not elsewhere classified: Secondary | ICD-10-CM

## 2016-02-09 DIAGNOSIS — M25562 Pain in left knee: Secondary | ICD-10-CM | POA: Diagnosis not present

## 2016-02-09 DIAGNOSIS — M25572 Pain in left ankle and joints of left foot: Secondary | ICD-10-CM | POA: Diagnosis not present

## 2016-02-09 DIAGNOSIS — M6281 Muscle weakness (generalized): Secondary | ICD-10-CM | POA: Insufficient documentation

## 2016-02-09 NOTE — Patient Instructions (Signed)
   Copyright  VHI. All rights reserved.  HIP: Flexion / KNEE: Extension, Straight Leg Raise   Raise leg, keeping knee straight. Perform slowly. _10__ reps per set, __2_ sets per day, _7__ days per week   Abduction: Clam (Eccentric) - Side-Lying   Lie on side with knees bent. Lift top knee, keeping feet together. Keep trunk steady. Slowly lower for 3-5 seconds. _10__ reps per set, __2_ sets per day, _7__ days per week.  Gastroc / Heel Cord Stretch - Seated With Towel   Sit on floor, towel around ball of foot. Gently pull foot in toward body, stretching heel cord and calf. Hold for 30___ seconds. Repeat on involved leg. Repeat __3_ times. Do _3__ times per day.  Copyright  VHI. All rights reserved.   Ruben Im PT [facility] [address], [place], [place] [address] Phone # [phone] Fax [phone]

## 2016-02-09 NOTE — Therapy (Signed)
[facility] [address], [place], [place], [address] Phone: [phone]   Fax:  [phone]  Physical Therapy Evaluation  Patient Details  Name: Kimberly [name] MRN: [id number] Date of Birth: Dec 20, 1950 Referring Provider: Dr. Durward Fortes  Encounter Date: 02/09/2016      PT End of Session - 02/09/16 2224    Visit Number 1   Number of Visits 10   Date for PT Re-Evaluation 04/05/16   Authorization Type Medicare G codes; KX at visit 15   PT Start Time 0845   PT Stop Time 0930   PT Time Calculation (min) 45 min   Activity Tolerance Patient tolerated treatment well      Past Medical History:  Diagnosis Date  . Anemia 2005   hb 11.5  . Arthritis   . DVT (deep venous thrombosis) ([place])    2003, coumadin x 6 mon  . GERD (gastroesophageal reflux disease) 2001   no problem now  . Hypertension   . Hypothyroidism    off meds  . Osteopenia   . Vegetarian diet    eats fish and eggs; no meat    Past Surgical History:  Procedure Laterality Date  . arthroscopy Right knee  03/16/03  . oophorectomy unilateral    . TOTAL KNEE ARTHROPLASTY Right 09/14/2014   Procedure: RIGHT TOTAL KNEE ARTHROPLASTY;  Surgeon: Garald Balding, MD;  Location: [place];  Service: Orthopedics;  Laterality: Right;    There were no vitals filed for this visit.       Subjective Assessment - 02/09/16 0852    Subjective Patient complains of left anterior knee pain, burning in toes, posterior ankle pain.  Had cortisone injection in ankle but "it didn't work."  The knee has hurt for years, ankle pain 3-4 months.  Wore ankle brace but not wearing today.  Wears sometimes for longer walking.  Ortho says left knee not bad enought for TKR yet.  Pain with initiating walking.  Pain with descending stairs, one leg at a time.     Pertinent History Right TKR 09/2014   Limitations Walking;Standing   How long can you walk comfortably? 15 min   Diagnostic tests MRI  ankle    Patient Stated Goals Want for walk 2-3 miles without pain;  less pain and limping with rising from chair    Currently in Pain? Yes   Pain Score 5    Pain Location Knee   Pain Orientation Left   Pain Type Chronic pain   Pain Onset More than a month ago   Pain Frequency Constant   Aggravating Factors  descending stairs, walking, rising from chair;  squatting (I don't sit on the floor)   Pain Relieving Factors elevate legs; ice; heat   Multiple Pain Sites Yes   Pain Score 7   Pain Location Ankle   Pain Orientation Left   Pain Type Chronic pain   Pain Onset More than a month ago   Pain Frequency Constant   Aggravating Factors  standing too long;     Pain Relieving Factors heat?, Ice?            [facility] PT Assessment - 02/09/16 0001      Assessment   Medical Diagnosis left ankle and left knee arthritis   Referring Provider Dr. Durward Fortes   Onset Date/Surgical Date --  > 6 months   Hand Dominance Right   Next MD Visit as need   Prior Therapy after TKR  Precautions   Precautions None   Precaution Comments osteopenia     Restrictions   Weight Bearing Restrictions No     Balance Screen   Has the patient fallen in the past 6 months No   Has the patient had a decrease in activity level because of a fear of falling?  No   Is the patient reluctant to leave their home because of a fear of falling?  No     Home Ecologist residence   Living Arrangements Spouse/significant other   Available Help at Discharge Family   Type of [place] to enter   Entrance Stairs-Number of Steps 2   [place] Two level   [place] - single point     Prior Function   Level of [place] Retired   Leisure travel with husband world travel; sewing     Observation/Other Assessments   Focus on Therapeutic Outcomes (FOTO)  55% limitation      Posture/Postural Control   Posture/Postural Control  Postural limitations   Posture Comments genu recurvatum;  pes planus bilaterally     ROM / Strength   AROM / PROM / Strength AROM;Strength     AROM   AROM Assessment Site Knee;Ankle   Right/Left Knee Right;Left   Right Knee Extension 0   Right Knee Flexion 112   Left Knee Extension 0   Left Knee Flexion 114   Right/Left Ankle Right;Left   Right Ankle Dorsiflexion 10   Right Ankle Plantar Flexion 50   Right Ankle Inversion 30   Right Ankle Eversion 15   Left Ankle Dorsiflexion 3   Left Ankle Plantar Flexion 50   Left Ankle Inversion 40   Left Ankle Eversion 5     Strength   Overall Strength Comments left foot instrinsics 4-/5;  left hip abduction 3+/5   Strength Assessment Site Knee;Ankle   Right/Left Knee Right;Left   Right Knee Flexion 4+/5   Right Knee Extension 4+/5   Left Knee Flexion 4-/5   Left Knee Extension 4-/5   Right/Left Ankle Right;Left   Right Ankle Dorsiflexion 4+/5   Right Ankle Plantar Flexion 4+/5   Right Ankle Inversion 4+/5   Right Ankle Eversion 4+/5   Left Ankle Dorsiflexion 4-/5   Left Ankle Plantar Flexion 4-/5   Left Ankle Inversion 4-/5   Left Ankle Eversion 4-/5     Flexibility   Soft Tissue Assessment /Muscle Length yes   Hamstrings mild left;  decreased gastroc length;  decreased left psoas     Palpation   Patella mobility decreased left   Palpation comment tender left achilles and lateral ankle                            PT Education - 02/09/16 2222    Education provided Yes   Education Details SLR, clams, calf stretch with towel;  towel scrunches   Person(s) Educated Patient   Methods Explanation;Demonstration;Handout   Comprehension Verbalized understanding;Returned demonstration          PT Short Term Goals - 02/09/16 2236      PT SHORT TERM GOAL #1   Title The patient will demonstrate compliance with initial HEP for ROM and initial strengthening of left knee and ankle  03/08/16   Time 4   Period  Weeks   Status New     PT SHORT  TERM GOAL #2   Title Left ankle DF improved to 5 degrees and left knee flexion improved to 118 degrees needed for greater ease with rising from a chair   Time 4   Period Weeks   Status New     PT SHORT TERM GOAL #3   Title pain with walking decreased >/= 25%   Time 4   Period Weeks   Status New           PT Long Term Goals - 02/09/16 2239      PT LONG TERM GOAL #1   Title Independent with HEP needed for further improvements in ROM and strength of left knee and ankle  04/05/16   Time 8   Period Weeks   Status New     PT LONG TERM GOAL #2   Title The patient will have improved left hip and knee strength to grossly 4/5 needed for descending steps with greater ease   Time 8   Period Weeks   Status New     PT LONG TERM GOAL #3   Title The patient will have improved left ankle DF to 7 degrees and eversion to 12 degrees needed for walking on uneven terrains with greater ease   Time 8   Period Weeks   Status New     PT LONG TERM GOAL #4   Title walking for 40 min or 2 miles with pain decreased >/= 60%   Time 8   Period Weeks   Status New     PT LONG TERM GOAL #5   Title FOTO functional outcome score improved from 55% limitation to 39% indicating improved function with less pain   Time 8   Period Weeks   Status New               Plan - 02/09/16 2225    Clinical Impression Statement The patient complains of a several year history of worsening left knee pain and the onset of left posterior and lateral ankle pain.  Pain is worsened with walking, rising from the chair and descending steps (one at a time).  Moderate left genu recurvatum; bilateral pes planus;  right TKR 0-112 degrees;  left knee 0-114 degrees;  Quad and HS strength 4-/5 Left and left hip abduction 3+/5.  Decreased left ankle dorsiflexion adn eversion.  Moderate swelling left ankle.  Decreased HS, psoas and gastroc muscle lengths.  The patient is hoping to avoid or delay  having left TKR.  She is of low evaluation complexity secondary to good home support and minimal co-morbidities.     Rehab Potential Good   Clinical Impairments Affecting Rehab Potential osteopenia   PT Frequency 2x / week   PT Duration 8 weeks   PT Treatment/Interventions ADLs/Self Care Home Management;Cryotherapy;Electrical Stimulation;Traction;Therapeutic exercise;Patient/family education;Manual techniques;Taping;Iontophoresis 4mg /ml Dexamethasone;Dry needling;Moist Heat;Vasopneumatic Device   PT Next Visit Plan start Nu-Step;  try open chain quad and HS strengthening;  start ankle band ex's   PT Home Exercise Plan SLR; clams; gastroc stretch with towel      Patient will benefit from skilled therapeutic intervention in order to improve the following deficits and impairments:  Pain, Decreased strength, Decreased range of motion, Difficulty walking, Impaired flexibility, Increased edema  Visit Diagnosis: Pain in left knee - Plan: PT plan of care cert/re-cert  Pain in left ankle and joints of left foot - Plan: PT plan of care cert/re-cert  Stiffness of left knee, not elsewhere classified - Plan: PT plan of  care cert/re-cert  Stiffness of left ankle, not elsewhere classified - Plan: PT plan of care cert/re-cert  Muscle weakness (generalized) - Plan: PT plan of care cert/re-cert      G-Codes - [id number] August 31, 2310    Functional Assessment Tool Used FOTO; clinical judgement    Functional Limitation Mobility: Walking and moving around   Mobility: Walking and Moving Around Current Status [phone]) At least 40 percent but less than 60 percent impaired, limited or restricted   Mobility: Walking and Moving Around Goal Status [phone]) At least 20 percent but less than 40 percent impaired, limited or restricted       Problem List Patient Active Problem List   Diagnosis Date Noted  . Osteopenia 02/28/2015  . Left lumbar radiculitis 02/28/2015  . Acute blood loss anemia 09/20/2014  . Primary  osteoarthritis of right knee 09/14/2014  . Primary osteoarthritis of knee 09/14/2014  . Vitamin B 12 deficiency 07/29/2014  . Obesity (BMI 30-39.9) 12/08/2013  . Osteoarthritis, knee 05/30/2012  . Metabolic syndrome 123XX123  . DVT, HX OF 02/10/2010  . Vitamin D deficiency 08/25/2007  . Disorder of bone and cartilage 04/08/2007  . Hypothyroidism 11/18/2006  . ANEMIA-NOS 11/18/2006  . Essential hypertension 11/18/2006  . GERD 11/18/2006   Ruben Im, PT 02/09/16 11:14 PM Phone: [phone] Fax: [phone]  Alvera Singh 02/09/2016, 11:14 PM  [facility] [address], [place], [place], [address] Phone: [phone]   Fax:  [phone]  Name: Finlee Winding MRN: [id number] Date of Birth: December 23, 1950

## 2016-02-09 NOTE — Therapy (Signed)
[facility] [address], [place], [place], [address] Phone: [phone]   Fax:  [phone]  Physical Therapy Evaluation  Patient Details  Name: Kimberly [name] MRN: [id number] Date of Birth: 07-27-1950 Referring Provider: Dr. Durward Fortes  Encounter Date: 02/09/2016      PT End of Session - 02/09/16 2224    Visit Number 1   Number of Visits 10   Date for PT Re-Evaluation 04/05/16   Authorization Type Medicare G codes; KX at visit 15   PT Start Time 0845   PT Stop Time 0930   PT Time Calculation (min) 45 min   Activity Tolerance Patient tolerated treatment well      Past Medical History:  Diagnosis Date  . Anemia 2005   hb 11.5  . Arthritis   . DVT (deep venous thrombosis) ([place])    2003, coumadin x 6 mon  . GERD (gastroesophageal reflux disease) 2001   no problem now  . Hypertension   . Hypothyroidism    off meds  . Osteopenia   . Vegetarian diet    eats fish and eggs; no meat    Past Surgical History:  Procedure Laterality Date  . arthroscopy Right knee  03/16/03  . oophorectomy unilateral    . TOTAL KNEE ARTHROPLASTY Right 09/14/2014   Procedure: RIGHT TOTAL KNEE ARTHROPLASTY;  Surgeon: Garald Balding, MD;  Location: [place];  Service: Orthopedics;  Laterality: Right;    There were no vitals filed for this visit.       Subjective Assessment - 02/09/16 0852    Subjective Patient complains of left anterior knee pain, burning in toes, posterior ankle pain.  Had cortisone injection in ankle but "it didn't work."  The knee has hurt for years, ankle pain 3-4 months.  Wore ankle brace but not wearing today.  Wears sometimes for longer walking.  Ortho says left knee not bad enought for TKR yet.  Pain with initiating walking.  Pain with descending stairs, one leg at a time.     Pertinent History Right TKR 09/2014   Limitations Walking;Standing   How long can you walk comfortably? 15 min   Diagnostic tests MRI  ankle    Patient Stated Goals Want for walk 2-3 miles without pain;  less pain and limping with rising from chair    Currently in Pain? Yes   Pain Score 5    Pain Location Knee   Pain Orientation Left   Pain Type Chronic pain   Pain Onset More than a month ago   Pain Frequency Constant   Aggravating Factors  descending stairs, walking, rising from chair;  squatting (I don't sit on the floor)   Pain Relieving Factors elevate legs; ice; heat   Multiple Pain Sites Yes   Pain Score 7   Pain Location Ankle   Pain Orientation Left   Pain Type Chronic pain   Pain Onset More than a month ago   Pain Frequency Constant   Aggravating Factors  standing too long;     Pain Relieving Factors heat?, Ice?            [facility] PT Assessment - 02/09/16 0001      Assessment   Medical Diagnosis left ankle and left knee arthritis   Referring Provider Dr. Durward Fortes   Onset Date/Surgical Date --  > 6 months   Hand Dominance Right   Next MD Visit as need   Prior Therapy after TKR  Precautions   Precautions None   Precaution Comments osteopenia     Restrictions   Weight Bearing Restrictions No     Balance Screen   Has the patient fallen in the past 6 months No   Has the patient had a decrease in activity level because of a fear of falling?  No   Is the patient reluctant to leave their home because of a fear of falling?  No     Home Ecologist residence   Living Arrangements Spouse/significant other   Available Help at Discharge Family   Type of [place] to enter   Entrance Stairs-Number of Steps 2   [place] Two level   [place] - single point     Prior Function   Level of [place] Retired   Leisure travel with husband world travel; sewing     Observation/Other Assessments   Focus on Therapeutic Outcomes (FOTO)  55% limitation      Posture/Postural Control   Posture/Postural Control  Postural limitations   Posture Comments genu recurvatum;  pes planus bilaterally     ROM / Strength   AROM / PROM / Strength AROM;Strength     AROM   AROM Assessment Site Knee;Ankle   Right/Left Knee Right;Left   Right Knee Extension 0   Right Knee Flexion 112   Left Knee Extension 0   Left Knee Flexion 114   Right/Left Ankle Right;Left   Right Ankle Dorsiflexion 10   Right Ankle Plantar Flexion 50   Right Ankle Inversion 30   Right Ankle Eversion 15   Left Ankle Dorsiflexion 3   Left Ankle Plantar Flexion 50   Left Ankle Inversion 40   Left Ankle Eversion 5     Strength   Overall Strength Comments left foot instrinsics 4-/5;  left hip abduction 3+/5   Strength Assessment Site Knee;Ankle   Right/Left Knee Right;Left   Right Knee Flexion 4+/5   Right Knee Extension 4+/5   Left Knee Flexion 4-/5   Left Knee Extension 4-/5   Right/Left Ankle Right;Left   Right Ankle Dorsiflexion 4+/5   Right Ankle Plantar Flexion 4+/5   Right Ankle Inversion 4+/5   Right Ankle Eversion 4+/5   Left Ankle Dorsiflexion 4-/5   Left Ankle Plantar Flexion 4-/5   Left Ankle Inversion 4-/5   Left Ankle Eversion 4-/5     Flexibility   Soft Tissue Assessment /Muscle Length yes   Hamstrings mild left;  decreased gastroc length;  decreased left psoas     Palpation   Patella mobility decreased left   Palpation comment tender left achilles and lateral ankle                            PT Education - 02/09/16 2222    Education provided Yes   Education Details SLR, clams, calf stretch with towel;  towel scrunches   Person(s) Educated Patient   Methods Explanation;Demonstration;Handout   Comprehension Verbalized understanding;Returned demonstration          PT Short Term Goals - 02/09/16 2236      PT SHORT TERM GOAL #1   Title The patient will demonstrate compliance with initial HEP for ROM and initial strengthening of left knee and ankle  03/08/16   Time 4   Period  Weeks   Status New     PT SHORT  TERM GOAL #2   Title Left ankle DF improved to 5 degrees and left knee flexion improved to 118 degrees needed for greater ease with rising from a chair   Time 4   Period Weeks   Status New     PT SHORT TERM GOAL #3   Title pain with walking decreased >/= 25%   Time 4   Period Weeks   Status New           PT Long Term Goals - 02/09/16 2239      PT LONG TERM GOAL #1   Title Independent with HEP needed for further improvements in ROM and strength of left knee and ankle  04/05/16   Time 8   Period Weeks   Status New     PT LONG TERM GOAL #2   Title The patient will have improved left hip and knee strength to grossly 4/5 needed for descending steps with greater ease   Time 8   Period Weeks   Status New     PT LONG TERM GOAL #3   Title The patient will have improved left ankle DF to 7 degrees and eversion to 12 degrees needed for walking on uneven terrains with greater ease   Time 8   Period Weeks   Status New     PT LONG TERM GOAL #4   Title walking for 40 min or 2 miles with pain decreased >/= 60%   Time 8   Period Weeks   Status New     PT LONG TERM GOAL #5   Title FOTO functional outcome score improved from 55% limitation to 39% indicating improved function with less pain   Time 8   Period Weeks   Status New               Plan - 02/09/16 2225    Clinical Impression Statement The patient complains of a several year history of worsening left knee pain and the onset of left posterior and lateral ankle pain.  Pain is worsened with walking, rising from the chair and descending steps (one at a time).  Moderate left genu recurvatum; bilateral pes planus;  right TKR 0-112 degrees;  left knee 0-114 degrees;  Quad and HS strength 4-/5 Left and left hip abduction 3+/5.  Decreased left ankle dorsiflexion adn eversion.  Moderate swelling left ankle.  Decreased HS, psoas and gastroc muscle lengths.  The patient is hoping to avoid or delay  having left TKR.  She is of low evaluation complexity secondary to good home support and minimal co-morbidities.     Rehab Potential Good   Clinical Impairments Affecting Rehab Potential osteopenia   PT Frequency 2x / week   PT Duration 8 weeks   PT Treatment/Interventions ADLs/Self Care Home Management;Cryotherapy;Electrical Stimulation;Traction;Therapeutic exercise;Patient/family education;Manual techniques;Taping;Iontophoresis 4mg /ml Dexamethasone;Dry needling;Moist Heat;Vasopneumatic Device   PT Next Visit Plan start Nu-Step;  try open chain quad and HS strengthening;  start ankle band ex's   PT Home Exercise Plan SLR; clams; gastroc stretch with towel      Patient will benefit from skilled therapeutic intervention in order to improve the following deficits and impairments:  Pain, Decreased strength, Decreased range of motion, Difficulty walking, Impaired flexibility, Increased edema  Visit Diagnosis: Pain in left knee - Plan: PT plan of care cert/re-cert  Pain in left ankle and joints of left foot - Plan: PT plan of care cert/re-cert  Stiffness of left knee, not elsewhere classified - Plan: PT plan of  care cert/re-cert  Stiffness of left ankle, not elsewhere classified - Plan: PT plan of care cert/re-cert  Muscle weakness (generalized) - Plan: PT plan of care cert/re-cert     Problem List Patient Active Problem List   Diagnosis Date Noted  . Osteopenia 02/28/2015  . Left lumbar radiculitis 02/28/2015  . Acute blood loss anemia 09/20/2014  . Primary osteoarthritis of right knee 09/14/2014  . Primary osteoarthritis of knee 09/14/2014  . Vitamin B 12 deficiency 07/29/2014  . Obesity (BMI 30-39.9) 12/08/2013  . Osteoarthritis, knee 05/30/2012  . Metabolic syndrome 123XX123  . DVT, HX OF 02/10/2010  . Vitamin D deficiency 08/25/2007  . Disorder of bone and cartilage 04/08/2007  . Hypothyroidism 11/18/2006  . ANEMIA-NOS 11/18/2006  . Essential hypertension 11/18/2006  .  GERD 11/18/2006  Ruben Im, PT 02/09/16 10:49 PM Phone: [phone] Fax: [phone]  Alvera Singh 02/09/2016, 10:49 PM  [facility] [address], [place], [place], [address] Phone: [phone]   Fax:  [phone]  Name: Kimberly [name] MRN: [id number] Date of Birth: 04-Mar-1951

## 2016-02-10 ENCOUNTER — Encounter: Payer: Self-pay | Admitting: Physical Therapy

## 2016-02-10 ENCOUNTER — Ambulatory Visit: Payer: Medicare Other | Admitting: Physical Therapy

## 2016-02-10 DIAGNOSIS — M25662 Stiffness of left knee, not elsewhere classified: Secondary | ICD-10-CM

## 2016-02-10 DIAGNOSIS — M6281 Muscle weakness (generalized): Secondary | ICD-10-CM | POA: Diagnosis not present

## 2016-02-10 DIAGNOSIS — M25672 Stiffness of left ankle, not elsewhere classified: Secondary | ICD-10-CM

## 2016-02-10 DIAGNOSIS — M25572 Pain in left ankle and joints of left foot: Secondary | ICD-10-CM | POA: Diagnosis not present

## 2016-02-10 DIAGNOSIS — M25562 Pain in left knee: Secondary | ICD-10-CM | POA: Diagnosis not present

## 2016-02-10 NOTE — Therapy (Signed)
[facility] [address], [place], [place], [address] Phone: [phone]   Fax:  [phone]  Physical Therapy Treatment  Patient Details  Name: Kimberly [name] MRN: [id number] Date of Birth: 1950-07-25 Referring Provider: Dr. Durward Fortes  Encounter Date: 02/10/2016      PT End of Session - 02/10/16 0934    Visit Number 2   Number of Visits 10   Date for PT Re-Evaluation 04/05/16   Authorization Type Medicare G codes; KX at visit 15   PT Start Time 0932   PT Stop Time 1027   PT Time Calculation (min) 55 min   Activity Tolerance Patient tolerated treatment well   Behavior During Therapy [facility] for tasks assessed/performed      Past Medical History:  Diagnosis Date  . Anemia 2005   hb 11.5  . Arthritis   . DVT (deep venous thrombosis) ([place])    2003, coumadin x 6 mon  . GERD (gastroesophageal reflux disease) 2001   no problem now  . Hypertension   . Hypothyroidism    off meds  . Osteopenia   . Vegetarian diet    eats fish and eggs; no meat    Past Surgical History:  Procedure Laterality Date  . arthroscopy Right knee  03/16/03  . oophorectomy unilateral    . TOTAL KNEE ARTHROPLASTY Right 09/14/2014   Procedure: RIGHT TOTAL KNEE ARTHROPLASTY;  Surgeon: Garald Balding, MD;  Location: [facility];  Service: Orthopedics;  Laterality: Right;    There were no vitals filed for this visit.      Subjective Assessment - 02/10/16 0940    Subjective Lt knee and ankle hurt this AM. Worse upon waking first thing in AM.   Currently in Pain? Yes   Pain Score 5    Pain Location Knee  Ankle 6/10   Pain Orientation Left   Pain Descriptors / Indicators Sore                         [facility] Adult PT Treatment/Exercise - 02/10/16 0001      Knee/Hip Exercises: Stretches   Active Hamstring Stretch Left;3 reps;20 seconds     Knee/Hip Exercises: Aerobic   Nustep L5 x 7 min     Knee/Hip Exercises: Standing   Rebounder weight shifting 3 ways 1 min each     Knee/Hip Exercises: Seated   Long Arc Quad --  Ball squeez to initiate 0# 2x 10   Ball Squeeze 15x     Knee/Hip Exercises: Supine   Straight Leg Raises AROM;Strengthening;Left;1 set;10 reps     Knee/Hip Exercises: Sidelying   Clams --  2 x10     Vasopneumatic   Number Minutes Vasopneumatic  15 minutes   Vasopnuematic Location  Ankle   Vasopneumatic Pressure Medium   Vasopneumatic Temperature  3 flakes  Swelling present medially     Ankle Exercises: Stretches   Other Stretch Towel stretch 3x 30 sec     Ankle Exercises: Supine   T-Band yellow 1x each  No pain                PT Education - 02/09/16 2222    Education provided Yes   Education Details SLR, clams, calf stretch with towel;  towel scrunches   Person(s) Educated Patient   Methods Explanation;Demonstration;Handout   Comprehension Verbalized understanding;Returned demonstration          PT Short Term Goals - 02/09/16 2236  PT SHORT TERM GOAL #1   Title The patient will demonstrate compliance with initial HEP for ROM and initial strengthening of left knee and ankle  03/08/16   Time 4   Period Weeks   Status New     PT SHORT TERM GOAL #2   Title Left ankle DF improved to 5 degrees and left knee flexion improved to 118 degrees needed for greater ease with rising from a chair   Time 4   Period Weeks   Status New     PT SHORT TERM GOAL #3   Title pain with walking decreased >/= 25%   Time 4   Period Weeks   Status New           PT Long Term Goals - 02-24-16 2237-08-27      PT LONG TERM GOAL #1   Title Independent with HEP needed for further improvements in ROM and strength of left knee and ankle  04/05/16   Time 8   Period Weeks   Status New     PT LONG TERM GOAL #2   Title The patient will have improved left hip and knee strength to grossly 4/5 needed for descending steps with greater ease   Time 8   Period Weeks   Status New     PT  LONG TERM GOAL #3   Title The patient will have improved left ankle DF to 7 degrees and eversion to 12 degrees needed for walking on uneven terrains with greater ease   Time 8   Period Weeks   Status New     PT LONG TERM GOAL #4   Title walking for 40 min or 2 miles with pain decreased >/= 60%   Time 8   Period Weeks   Status New     PT LONG TERM GOAL #5   Title FOTO functional outcome score improved from 55% limitation to 39% indicating improved function with less pain   Time 8   Period Weeks   Status New               Plan - 02/10/16 0935    Clinical Impression Statement Pt reports more heel pain at beginning of session, but switched half way through session to report she only felt hamstring tightness and not much pain.  Added LT hamstring stretching to HEP which she believes was given to her when she came for her back pain. Monitored pt for pain throughout.  Pt's medial ankle was moderately swollen.    Rehab Potential Good   Clinical Impairments Affecting Rehab Potential osteopenia   PT Frequency 2x / week   PT Duration 8 weeks   PT Treatment/Interventions ADLs/Self Care Home Management;Cryotherapy;Electrical Stimulation;Traction;Therapeutic exercise;Patient/family education;Manual techniques;Taping;Iontophoresis 4mg /ml Dexamethasone;Dry needling;Moist Heat;Vasopneumatic Device   PT Next Visit Plan Continue with quad and ankle strength, hamstring stretching, Vaso for ankle swelling.    Consulted and Agree with Plan of Care --      Patient will benefit from skilled therapeutic intervention in order to improve the following deficits and impairments:  Pain, Decreased strength, Decreased range of motion, Difficulty walking, Impaired flexibility, Increased edema  Visit Diagnosis: Pain in left knee  Pain in left ankle and joints of left foot  Stiffness of left knee, not elsewhere classified  Stiffness of left ankle, not elsewhere classified  Muscle weakness  (generalized)       G-Codes - 2016/02/24 08/28/2310    Functional Assessment Tool Used FOTO; clinical judgement  Functional Limitation Mobility: Walking and moving around   Mobility: Walking and Moving Around Current Status [phone]) At least 40 percent but less than 60 percent impaired, limited or restricted   Mobility: Walking and Moving Around Goal Status [phone]) At least 20 percent but less than 40 percent impaired, limited or restricted      Problem List Patient Active Problem List   Diagnosis Date Noted  . Osteopenia 02/28/2015  . Left lumbar radiculitis 02/28/2015  . Acute blood loss anemia 09/20/2014  . Primary osteoarthritis of right knee 09/14/2014  . Primary osteoarthritis of knee 09/14/2014  . Vitamin B 12 deficiency 07/29/2014  . Obesity (BMI 30-39.9) 12/08/2013  . Osteoarthritis, knee 05/30/2012  . Metabolic syndrome 123XX123  . DVT, HX OF 02/10/2010  . Vitamin D deficiency 08/25/2007  . Disorder of bone and cartilage 04/08/2007  . Hypothyroidism 11/18/2006  . ANEMIA-NOS 11/18/2006  . Essential hypertension 11/18/2006  . GERD 11/18/2006    Kimberly [name], PTA 02/10/2016, 10:17 AM  [facility] [address], [place], [place], [address] Phone: [phone]   Fax:  [phone]  Name: Kimberly [name] MRN: [id number] Date of Birth: Jul 27, 1950

## 2016-02-13 ENCOUNTER — Encounter: Payer: Self-pay | Admitting: Physical Therapy

## 2016-02-13 ENCOUNTER — Ambulatory Visit: Payer: Medicare Other | Attending: Orthopaedic Surgery | Admitting: Physical Therapy

## 2016-02-13 DIAGNOSIS — M25562 Pain in left knee: Secondary | ICD-10-CM | POA: Diagnosis not present

## 2016-02-13 DIAGNOSIS — M25572 Pain in left ankle and joints of left foot: Secondary | ICD-10-CM | POA: Insufficient documentation

## 2016-02-13 DIAGNOSIS — R600 Localized edema: Secondary | ICD-10-CM | POA: Diagnosis not present

## 2016-02-13 DIAGNOSIS — G8929 Other chronic pain: Secondary | ICD-10-CM

## 2016-02-13 DIAGNOSIS — M6281 Muscle weakness (generalized): Secondary | ICD-10-CM | POA: Diagnosis not present

## 2016-02-13 DIAGNOSIS — M25662 Stiffness of left knee, not elsewhere classified: Secondary | ICD-10-CM | POA: Diagnosis not present

## 2016-02-13 DIAGNOSIS — M25672 Stiffness of left ankle, not elsewhere classified: Secondary | ICD-10-CM | POA: Diagnosis not present

## 2016-02-13 NOTE — Therapy (Signed)
[facility] [address], [place], [place], [address] Phone: [phone]   Fax:  [phone]  Physical Therapy Treatment  Patient Details  Name: Kimberly [name] MRN: [id number] Date of Birth: [birth year] Referring Provider: Dr. Durward Fortes  Encounter Date: 02/13/2016      PT End of Session - 02/13/16 1136    Visit Number 3   Number of Visits 10   Date for PT Re-Evaluation 04/05/16   Authorization Type Medicare G codes; KX at visit 15   PT Start Time 1100   PT Stop Time 1150   PT Time Calculation (min) 50 min   Activity Tolerance Patient tolerated treatment well   Behavior During Therapy [facility] for tasks assessed/performed      Past Medical History:  Diagnosis Date  . Anemia 2005   hb 11.5  . Arthritis   . DVT (deep venous thrombosis) ([place])    2003, coumadin x 6 mon  . GERD (gastroesophageal reflux disease) 2001   no problem now  . Hypertension   . Hypothyroidism    off meds  . Osteopenia   . Vegetarian diet    eats fish and eggs; no meat    Past Surgical History:  Procedure Laterality Date  . arthroscopy Right knee  03/16/03  . oophorectomy unilateral    . TOTAL KNEE ARTHROPLASTY Right 09/14/2014   Procedure: RIGHT TOTAL KNEE ARTHROPLASTY;  Surgeon: Garald Balding, MD;  Location: [facility];  Service: Orthopedics;  Laterality: Right;    There were no vitals filed for this visit.      Subjective Assessment - 02/13/16 1101    Subjective "I'm feeling much better than before"   Pertinent History Right TKR 09/2014   Patient Stated Goals Want for walk 2-3 miles without pain;  less pain and limping with rising from chair    Pain Score 1    Pain Location Knee   Pain Orientation Left   Pain Descriptors / Indicators Sore   Pain Onset More than a month ago   Multiple Pain Sites No                         [facility] Adult PT Treatment/Exercise - 02/13/16 0001      Knee/Hip Exercises: Stretches   Active Hamstring Stretch 2 reps;30 seconds     Knee/Hip Exercises: Aerobic   Nustep L 5 x 7 minutes     Knee/Hip Exercises: Machines for Strengthening   Total Gym Leg Press 60# bilat LEs x 30, then plantar flex 30# x 20     Knee/Hip Exercises: Standing   Rebounder wt shifting 3 directions x 1 minute     Knee/Hip Exercises: Seated   Long Arc Quad Both;2 sets;5 reps  with adduction squeeze     Vasopneumatic   Number Minutes Vasopneumatic  15 minutes   Vasopnuematic Location  Ankle   Vasopneumatic Pressure Medium   Vasopneumatic Temperature  3 flakes     Ankle Exercises: Stretches   Other Stretch towel stretch 2 x 30 seconds     Ankle Exercises: Supine   T-Band yellow all directions x 20     Ankle Exercises: Standing   SLS attempt Lt LE, pt able to hold < 5 seconds, able to perform > 30seconds Rt LE                  PT Short Term Goals - 02/13/16 1137      PT  SHORT TERM GOAL #1   Title The patient will demonstrate compliance with initial HEP for ROM and initial strengthening of left knee and ankle  03/08/16   Status On-going     PT SHORT TERM GOAL #2   Title Left ankle DF improved to 5 degrees and left knee flexion improved to 118 degrees needed for greater ease with rising from a chair   Status On-going     PT SHORT TERM GOAL #3   Title pain with walking decreased >/= 25%   Status On-going           PT Long Term Goals - 02/13/16 1138      PT LONG TERM GOAL #1   Title Independent with HEP needed for further improvements in ROM and strength of left knee and ankle  04/05/16   Status On-going     PT LONG TERM GOAL #2   Title The patient will have improved left hip and knee strength to grossly 4/5 needed for descending steps with greater ease   Status On-going     PT LONG TERM GOAL #3   Title The patient will have improved left ankle DF to 7 degrees and eversion to 12 degrees needed for walking on uneven terrains with greater ease   Status On-going      PT LONG TERM GOAL #4   Title walking for 40 min or 2 miles with pain decreased >/= 60%   Status On-going     PT LONG TERM GOAL #5   Title FOTO functional outcome score improved from 55% limitation to 39% indicating improved function with less pain   Status On-going               Plan - 02/13/16 1136    Clinical Impression Statement Pt pleased with progress, reports she has been going walking and going to the gym, still noticable edema Lt ankle   Rehab Potential Good   Clinical Impairments Affecting Rehab Potential osteopenia   PT Frequency 2x / week   PT Duration 8 weeks   PT Treatment/Interventions ADLs/Self Care Home Management;Cryotherapy;Electrical Stimulation;Traction;Therapeutic exercise;Patient/family education;Manual techniques;Taping;Iontophoresis 4mg /ml Dexamethasone;Dry needling;Moist Heat;Vasopneumatic Device   PT Next Visit Plan progress quad and ankle strength and proprioception   Consulted and Agree with Plan of Care Patient      Patient will benefit from skilled therapeutic intervention in order to improve the following deficits and impairments:  Pain, Decreased strength, Decreased range of motion, Difficulty walking, Impaired flexibility, Increased edema  Visit Diagnosis: Chronic pain of left knee  Pain in left ankle and joints of left foot  Stiffness of left knee, not elsewhere classified  Stiffness of left ankle, not elsewhere classified  Muscle weakness (generalized)     Problem List Patient Active Problem List   Diagnosis Date Noted  . Osteopenia 02/28/2015  . Left lumbar radiculitis 02/28/2015  . Acute blood loss anemia 09/20/2014  . Primary osteoarthritis of right knee 09/14/2014  . Primary osteoarthritis of knee 09/14/2014  . Vitamin B 12 deficiency 07/29/2014  . Obesity (BMI 30-39.9) 12/08/2013  . Osteoarthritis, knee 05/30/2012  . Metabolic syndrome 123XX123  . DVT, HX OF 02/10/2010  . Vitamin D deficiency 08/25/2007  .  Disorder of bone and cartilage 04/08/2007  . Hypothyroidism 11/18/2006  . ANEMIA-NOS 11/18/2006  . Essential hypertension 11/18/2006  . GERD 11/18/2006    Isabelle Course, PT, DPT 02/13/2016, 11:39 AM  [facility] [address], [place], [place], [address]  Phone: [phone]   Fax:  [phone]  Name: Kimberly [name] MRN: [id number] Date of Birth: 1950/07/18

## 2016-02-15 ENCOUNTER — Ambulatory Visit (INDEPENDENT_AMBULATORY_CARE_PROVIDER_SITE_OTHER): Payer: Medicare Other | Admitting: Family Medicine

## 2016-02-15 VITALS — BP 140/80 | HR 77 | Temp 97.8°F | Ht 62.0 in | Wt 166.9 lb

## 2016-02-15 DIAGNOSIS — Z23 Encounter for immunization: Secondary | ICD-10-CM | POA: Diagnosis not present

## 2016-02-15 DIAGNOSIS — M5416 Radiculopathy, lumbar region: Secondary | ICD-10-CM | POA: Diagnosis not present

## 2016-02-15 NOTE — Patient Instructions (Signed)
Lumbosacral Radiculopathy °Lumbosacral radiculopathy is a condition that involves the spinal nerves and nerve roots in the low back and bottom of the spine. The condition develops when these nerves and nerve roots move out of place or become inflamed and cause symptoms. °CAUSES °This condition may be caused by: °· Pressure from a disk that bulges out of place (herniated disk). A disk is a plate of cartilage that separates bones in the spine. °· Disk degeneration. °· A narrowing of the bones of the lower back (spinal stenosis). °· A tumor. °· An infection. °· An injury that places sudden pressure on the disks that cushion the bones of your lower spine. °RISK FACTORS °This condition is more likely to develop in: °· Males aged 30-50 years. °· Females aged 50-60 years. °· People who lift improperly. °· People who are overweight or live a sedentary lifestyle. °· People who smoke. °· People who perform repetitive activities that strain the spine. °SYMPTOMS °Symptoms of this condition include: °· Pain that goes down from the back into the legs (sciatica). This is the most common symptom. The pain may be worse with sitting, coughing, or sneezing. °· Pain and numbness in the arms and legs. °· Muscle weakness. °· Tingling. °· Loss of bladder control or bowel control. °DIAGNOSIS °This condition is diagnosed with a physical exam and medical history. If the pain is lasting, you may have tests, such as: °· MRI scan. °· X-ray. °· CT scan. °· Myelogram. °· Nerve conduction study. °TREATMENT °This condition is often treated with: °· Hot packs and ice applied to affected areas. °· Stretches to improve flexibility. °· Exercises to strengthen back muscles. °· Physical therapy. °· Pain medicine. °· A steroid injection in the spine. °In some cases, no treatment is needed. If the condition is long-lasting (chronic), or if symptoms are severe, treatment may involve surgery or lifestyle changes, such as following a weight loss plan. °HOME  CARE INSTRUCTIONS °Medicines °· Take medicines only as directed by your health care provider. °· Do not drive or operate heavy machinery while taking pain medicine. °Injury Care °· Apply a heat pack to the injured area as directed by your health care provider. °· Apply ice to the affected area: °¨ Put ice in a plastic bag. °¨ Place a towel between your skin and the bag. °¨ Leave the ice on for 20-30 minutes, every 2 hours while you are awake or as needed. Or, leave the ice on for as long as directed by your health care provider. °Other Instructions °· If you were shown how to do any exercises or stretches, do them as directed by your health care provider. °· If your health care provider prescribed a diet or exercise program, follow it as directed. °· Keep all follow-up visits as directed by your health care provider. This is important. °SEEK MEDICAL CARE IF: °· Your pain does not improve over time even when taking pain medicines. °SEEK IMMEDIATE MEDICAL CARE IF: °· Your develop severe pain. °· Your pain suddenly gets worse. °· You develop increasing weakness in your legs. °· You lose the ability to control your bladder or bowel. °· You have difficulty walking or balancing. °· You have a fever. °  °This information is not intended to replace advice given to you by your health care provider. Make sure you discuss any questions you have with your health care provider. °  °Document Released: 04/30/2005 Document Revised: 09/14/2014 Document Reviewed: 04/26/2014 °Elsevier Interactive Patient Education ©2016 [employer] ° °

## 2016-02-15 NOTE — Progress Notes (Signed)
Subjective:     Patient ID: Kimberly [name], female   DOB: 12-Feb-1951, 65 y.o.   MRN: [id number]  HPI   Patient seen with left-sided lumbar back pain radiating down the left lower extremity with intermittent symptoms for the past year. She states that a little over a month ago she was in [place] visiting her daughter who is a physician and she was seen by orthopedist there for some left ankle pain. X-rays revealed osteoarthritis. She returned here and saw orthopedist and had hip x-rays which were reportedly normal and MRI of ankle showed a couple of benign-appearing bone cysts and confirmed osteoarthritis. She was given a brace and started on physical therapy but has not seen much improvement. She describes a different pain which is achy discomfort lower back radiating into the buttock and all the way down to the foot. She has frequent burning sensation of the foot and sometimes intermittent numbness involving the second through fifth toes. Sparing of the great toe. Pain is worse with sitting. 7/10 severity at worse. Symptoms for greater than a year. She's tried Tylenol and Advil with minimal improvement. Possible mild weakness with dorsiflexion  Patient denies any prior imaging of her back. No fever or chills. No appetite or weight changes. No urine or stool incontinence  Past Medical History:  Diagnosis Date  . Anemia 2005   hb 11.5  . Arthritis   . DVT (deep venous thrombosis) ([place])    2003, coumadin x 6 mon  . GERD (gastroesophageal reflux disease) 2001   no problem now  . Hypertension   . Hypothyroidism    off meds  . Osteopenia   . Vegetarian diet    eats fish and eggs; no meat   Past Surgical History:  Procedure Laterality Date  . arthroscopy Right knee  03/16/03  . oophorectomy unilateral    . TOTAL KNEE ARTHROPLASTY Right 09/14/2014   Procedure: RIGHT TOTAL KNEE ARTHROPLASTY;  Surgeon: Garald Balding, MD;  Location: [facility];  Service: Orthopedics;  Laterality: Right;    reports that she has never smoked. She has never used smokeless tobacco. She reports that she does not drink alcohol or use drugs. family history includes Alcohol abuse in her other; Diabetes in her mother and other; Heart failure in her father. Allergies  Allergen Reactions  . Enoxaparin Sodium     REACTION: itching,swelling     Review of Systems  Constitutional: Negative for appetite change, chills, fever and unexpected weight change.  Respiratory: Negative for shortness of breath.   Gastrointestinal: Negative for abdominal pain.  Genitourinary: Negative for dysuria.  Musculoskeletal: Positive for back pain.  Neurological: Positive for weakness and numbness.       Objective:   Physical Exam  Constitutional: She appears well-developed and well-nourished.  Cardiovascular: Normal rate and regular rhythm.   Pulmonary/Chest: Effort normal and breath sounds normal. No respiratory distress. She has no wheezes. She has no rales.  Musculoskeletal: She exhibits no edema.  Straight leg raises are negative bilaterally. She has no evidence for any leg edema  Neurological:  Patient has 2+ reflexes knee bilaterally and 1+ ankle bilaterally. She has slight weakness with dorsiflexion on left compared to the right otherwise symmetric strength lower extremities. Normal sensory function throughout       Assessment:     Patient seen with over one year history of somewhat progressive left lumbar radiculopathy symptoms. She has mild weakness with left dorsiflexion. Not improved with conservative measures.    Plan:     -  Set up MRI lumbar spine to further assess -Continue Tylenol in the meantime as needed for symptom control -Flu vaccine and Pneumovax given  Eulas Post MD [facility]

## 2016-02-16 NOTE — Progress Notes (Deleted)
65 y.o. [id number] Married [place] F here for annual exam.    Patient's last menstrual period was 05/14/1998.          Sexually active: {yes no:314532}  The current method of family planning is {contraception:315051}.    Exercising: {yes no:314532}  {types:19826} Smoker:  {YES V2345720  Health Maintenance: Pap:  08/24/13 negative   History of abnormal Pap:  {YES NO:22349} MMG:  02/02/15 BIRADS 1 negative  Colonoscopy:  02/10/10 BMD:   02/20/15 osteoporosis  TDaP:  02/12/07  Pneumonia vaccine(s):  02/15/16  Zostavax:   02/09/11  Hep C testing: *** Screening Labs: ***, Hb today: ***, Urine today: ***   reports that she has never smoked. She has never used smokeless tobacco. She reports that she does not drink alcohol or use drugs.  Past Medical History:  Diagnosis Date  . Anemia 2005   hb 11.5  . Arthritis   . DVT (deep venous thrombosis) ([place])    2003, coumadin x 6 mon  . GERD (gastroesophageal reflux disease) 2001   no problem now  . Hypertension   . Hypothyroidism    off meds  . Osteopenia   . Vegetarian diet    eats fish and eggs; no meat    Past Surgical History:  Procedure Laterality Date  . arthroscopy Right knee  03/16/03  . oophorectomy unilateral    . TOTAL KNEE ARTHROPLASTY Right 09/14/2014   Procedure: RIGHT TOTAL KNEE ARTHROPLASTY;  Surgeon: Garald Balding, MD;  Location: [facility];  Service: Orthopedics;  Laterality: Right;    Current Outpatient Prescriptions  Medication Sig Dispense Refill  . calcium carbonate (OS-CAL) 600 MG TABS Take 600 mg by mouth 2 (two) times daily with a meal.    . cholecalciferol (VITAMIN D) 1000 UNITS tablet Take 1,000 Units by mouth daily.      . irbesartan (AVAPRO) 150 MG tablet TAKE 1 TABLET EVERY DAY 90 tablet 3  . levothyroxine (SYNTHROID, LEVOTHROID) 50 MCG tablet TAKE 1 TABLET ONE TIME DAILY 90 tablet 3   No current facility-administered medications for this visit.     Family History  Problem Relation Age of Onset  . Diabetes  Mother   . Heart failure Father   . Diabetes Other   . Alcohol abuse Other     ROS:  Pertinent items are noted in HPI.  Otherwise, a comprehensive ROS was negative.  Exam:   LMP 05/14/1998   Weight change: @WEIGHTCHANGE @ Height:      Ht Readings from Last 3 Encounters:  02/15/16 5\' 2"  (1.575 m)  02/07/16 5\' 2"  (1.575 m)  08/05/15 5\' 1"  (1.549 m)    General appearance: alert, cooperative and appears stated age Head: Normocephalic, without obvious abnormality, atraumatic Neck: no adenopathy, supple, symmetrical, trachea midline and thyroid {EXAM; THYROID:18604} Lungs: clear to auscultation bilaterally Breasts: {Exam; breast:13139::"normal appearance, no masses or tenderness"} Heart: regular rate and rhythm Abdomen: soft, non-tender; bowel sounds normal; no masses,  no organomegaly Extremities: extremities normal, atraumatic, no cyanosis or edema Skin: Skin color, texture, turgor normal. No rashes or lesions Lymph nodes: Cervical, supraclavicular, and axillary nodes normal. No abnormal inguinal nodes palpated Neurologic: Grossly normal   Pelvic: External genitalia:  no lesions              Urethra:  normal appearing urethra with no masses, tenderness or lesions              Bartholins and Skenes: normal  Vagina: normal appearing vagina with normal color and discharge, no lesions              Cervix: {exam; cervix:14595}              Pap taken: {yes no:314532} Bimanual Exam:  Uterus:  {exam; uterus:12215}              Adnexa: {exam; adnexa:12223}               Rectovaginal: Confirms               Anus:  normal sphincter tone, no lesions  Chaperone was present for exam.  A:  Well Woman with normal exam  P:   {plan; gyn:5269::"mammogram","pap smear","return annually or prn"}

## 2016-02-17 ENCOUNTER — Encounter: Payer: Self-pay | Admitting: Physical Therapy

## 2016-02-17 ENCOUNTER — Ambulatory Visit: Payer: Medicare Other | Admitting: Physical Therapy

## 2016-02-17 ENCOUNTER — Telehealth: Payer: Self-pay | Admitting: Obstetrics & Gynecology

## 2016-02-17 ENCOUNTER — Ambulatory Visit: Payer: Medicare Other | Admitting: Obstetrics & Gynecology

## 2016-02-17 DIAGNOSIS — M25572 Pain in left ankle and joints of left foot: Secondary | ICD-10-CM

## 2016-02-17 DIAGNOSIS — R600 Localized edema: Secondary | ICD-10-CM

## 2016-02-17 DIAGNOSIS — G8929 Other chronic pain: Secondary | ICD-10-CM

## 2016-02-17 DIAGNOSIS — M25672 Stiffness of left ankle, not elsewhere classified: Secondary | ICD-10-CM

## 2016-02-17 DIAGNOSIS — M25562 Pain in left knee: Principal | ICD-10-CM

## 2016-02-17 DIAGNOSIS — M6281 Muscle weakness (generalized): Secondary | ICD-10-CM | POA: Diagnosis not present

## 2016-02-17 DIAGNOSIS — M25662 Stiffness of left knee, not elsewhere classified: Secondary | ICD-10-CM

## 2016-02-17 NOTE — Telephone Encounter (Signed)
Patient cancelled aex appointment. See staff message °

## 2016-02-17 NOTE — Therapy (Signed)
[facility] [address] [place], [place], [address] Phone: [phone]   Fax:  [phone]  Physical Therapy Treatment  Patient Details  Name: Kimberly [name] MRN: [id number] Date of Birth: May 06, 1951 Referring Provider: Dr. Durward Fortes  Encounter Date: 02/17/2016      PT End of Session - 02/17/16 0858    Visit Number 4   Number of Visits 10   Date for PT Re-Evaluation 04/05/16   Authorization Type Medicare G codes; KX at visit 15   PT Start Time 0853   PT Stop Time 0945   PT Time Calculation (min) 52 min   Activity Tolerance Patient tolerated treatment well   Behavior During Therapy [facility] for tasks assessed/performed      Past Medical History:  Diagnosis Date  . Anemia 2005   hb 11.5  . Arthritis   . DVT (deep venous thrombosis) ([facility])    2003, coumadin x 6 mon  . GERD (gastroesophageal reflux disease) 2001   no problem now  . Hypertension   . Hypothyroidism    off meds  . Osteopenia   . Vegetarian diet    eats fish and eggs; no meat    Past Surgical History:  Procedure Laterality Date  . arthroscopy Right knee  03/16/03  . oophorectomy unilateral    . TOTAL KNEE ARTHROPLASTY Right 09/14/2014   Procedure: RIGHT TOTAL KNEE ARTHROPLASTY;  Surgeon: Garald Balding, MD;  Location: [facility];  Service: Orthopedics;  Laterality: Right;    There were no vitals filed for this visit.      Subjective Assessment - 02/17/16 0856    Subjective Pt reports her leg feels 25% better,having MRI on her low back tomorrow.    Currently in Pain? Yes  Got flu shot in arm; arm is sore   Multiple Pain Sites No                         [facility] Adult PT Treatment/Exercise - 02/17/16 0001      Knee/Hip Exercises: Stretches   Active Hamstring Stretch Both;3 reps;30 seconds   Gastroc Stretch --  Rocker board 3 min focus on stretch     Knee/Hip Exercises: Aerobic   Nustep L5 x 8 min     Knee/Hip Exercises:  Standing   Rebounder Marching on mini tramp x 1 min  Then shift weight frwd/bwkwrd 1 min each way     Vasopneumatic   Number Minutes Vasopneumatic  15 minutes   Vasopnuematic Location  Ankle   Vasopneumatic Pressure Medium   Vasopneumatic Temperature  3 flakes     Manual Therapy   Manual Therapy --  LT gastroc soft tissue work/edema massage    Manual therapy comments Pt sidelying                  PT Short Term Goals - 02/13/16 1137      PT SHORT TERM GOAL #1   Title The patient will demonstrate compliance with initial HEP for ROM and initial strengthening of left knee and ankle  03/08/16   Status On-going     PT SHORT TERM GOAL #2   Title Left ankle DF improved to 5 degrees and left knee flexion improved to 118 degrees needed for greater ease with rising from a chair   Status On-going     PT SHORT TERM GOAL #3   Title pain with walking decreased >/= 25%   Status On-going  PT Long Term Goals - 02/13/16 1138      PT LONG TERM GOAL #1   Title Independent with HEP needed for further improvements in ROM and strength of left knee and ankle  04/05/16   Status On-going     PT LONG TERM GOAL #2   Title The patient will have improved left hip and knee strength to grossly 4/5 needed for descending steps with greater ease   Status On-going     PT LONG TERM GOAL #3   Title The patient will have improved left ankle DF to 7 degrees and eversion to 12 degrees needed for walking on uneven terrains with greater ease   Status On-going     PT LONG TERM GOAL #4   Title walking for 40 min or 2 miles with pain decreased >/= 60%   Status On-going     PT LONG TERM GOAL #5   Title FOTO functional outcome score improved from 55% limitation to 39% indicating improved function with less pain   Status On-going               Plan - 02/17/16 0858    Clinical Impression Statement Pt has recently seen Dr Elease Hashimoto who thinks the LT leg pain is potentially from her  back. She wil lbe having an MRI tomorrow. Overall she reports the exercises and stretches have reduced her pain by 25%. Edema in ankle has been present to many years.    Rehab Potential Good   Clinical Impairments Affecting Rehab Potential osteopenia   PT Frequency 2x / week   PT Duration 8 weeks   PT Treatment/Interventions ADLs/Self Care Home Management;Cryotherapy;Electrical Stimulation;Traction;Therapeutic exercise;Patient/family education;Manual techniques;Taping;Iontophoresis 4mg /ml Dexamethasone;Dry needling;Moist Heat;Vasopneumatic Device   PT Next Visit Plan progress quad and ankle strength and proprioception, look at need for hip stretching for HEP.    Consulted and Agree with Plan of Care --      Patient will benefit from skilled therapeutic intervention in order to improve the following deficits and impairments:  Pain, Decreased strength, Decreased range of motion, Difficulty walking, Impaired flexibility, Increased edema  Visit Diagnosis: Chronic pain of left knee  Pain in left ankle and joints of left foot  Stiffness of left knee, not elsewhere classified  Stiffness of left ankle, not elsewhere classified  Muscle weakness (generalized)  Localized edema     Problem List Patient Active Problem List   Diagnosis Date Noted  . Osteopenia 02/28/2015  . Left lumbar radiculitis 02/28/2015  . Acute blood loss anemia 09/20/2014  . Primary osteoarthritis of right knee 09/14/2014  . Primary osteoarthritis of knee 09/14/2014  . Vitamin B 12 deficiency 07/29/2014  . Obesity (BMI 30-39.9) 12/08/2013  . Osteoarthritis, knee 05/30/2012  . Metabolic syndrome 123XX123  . DVT, HX OF 02/10/2010  . Vitamin D deficiency 08/25/2007  . Disorder of bone and cartilage 04/08/2007  . Hypothyroidism 11/18/2006  . ANEMIA-NOS 11/18/2006  . Essential hypertension 11/18/2006  . GERD 11/18/2006    Thera Basden, PTA 02/17/2016, 9:36 AM   [facility] [address], [place], [place], [address] Phone: [phone]   Fax:  [phone]  Name: Kimberly [name] MRN: [id number] Date of Birth: 07-Oct-1950

## 2016-02-18 ENCOUNTER — Ambulatory Visit
Admission: RE | Admit: 2016-02-18 | Discharge: 2016-02-18 | Disposition: A | Discharge status: Home / Self Care | Payer: Medicare Other | Source: Ambulatory Visit | Attending: Family Medicine | Admitting: Family Medicine

## 2016-02-18 DIAGNOSIS — M5416 Radiculopathy, lumbar region: Secondary | ICD-10-CM

## 2016-02-18 DIAGNOSIS — M48061 Spinal stenosis, lumbar region without neurogenic claudication: Secondary | ICD-10-CM | POA: Diagnosis not present

## 2016-02-20 ENCOUNTER — Ambulatory Visit: Payer: Medicare Other

## 2016-02-20 ENCOUNTER — Telehealth: Payer: Self-pay | Admitting: Family Medicine

## 2016-02-20 DIAGNOSIS — M25562 Pain in left knee: Secondary | ICD-10-CM | POA: Diagnosis not present

## 2016-02-20 DIAGNOSIS — M25672 Stiffness of left ankle, not elsewhere classified: Secondary | ICD-10-CM

## 2016-02-20 DIAGNOSIS — M25662 Stiffness of left knee, not elsewhere classified: Secondary | ICD-10-CM | POA: Diagnosis not present

## 2016-02-20 DIAGNOSIS — M6281 Muscle weakness (generalized): Secondary | ICD-10-CM

## 2016-02-20 DIAGNOSIS — M25572 Pain in left ankle and joints of left foot: Secondary | ICD-10-CM

## 2016-02-20 DIAGNOSIS — G8929 Other chronic pain: Secondary | ICD-10-CM | POA: Diagnosis not present

## 2016-02-20 NOTE — Telephone Encounter (Signed)
Please see message and advise 

## 2016-02-20 NOTE — Telephone Encounter (Signed)
Pt has reviewed  her mri on mychart and would like to know what is the next step?

## 2016-02-20 NOTE — Patient Instructions (Addendum)
Piriformis Stretch, Sitting    Sit, one ankle on opposite knee, same-side hand on crossed knee. Push down on knee, keeping spine straight. Lean torso forward, with flat back, until tension is felt in hamstrings and gluteals of crossed-leg side. Hold _20__ seconds.  Repeat 3___ times per session. Do _3__ sessions per day.  Copyright  VHI. All rights reserved.  [facility] [address] [place], [place] [address] Phone # [phone] Fax [phone]

## 2016-02-20 NOTE — Therapy (Signed)
[facility] [address], [place], [place], [address] Phone: [phone]   Fax:  [phone]  Physical Therapy Treatment  Patient Details  Name: Kimberly [name] MRN: [id number] Date of Birth: 1950-08-18 Referring Provider: Dr. Durward Fortes  Encounter Date: 02/20/2016      PT End of Session - 02/20/16 1306    Visit Number 5   Number of Visits 10   Date for PT Re-Evaluation 04/05/16   Authorization Type Medicare G codes; KX at visit 15   PT Start Time 1226   PT Stop Time 1318   PT Time Calculation (min) 52 min   Activity Tolerance Patient tolerated treatment well   Behavior During Therapy [facility] for tasks assessed/performed      Past Medical History:  Diagnosis Date  . Anemia 2005   hb 11.5  . Arthritis   . DVT (deep venous thrombosis) ([place])    2003, coumadin x 6 mon  . GERD (gastroesophageal reflux disease) 2001   no problem now  . Hypertension   . Hypothyroidism    off meds  . Osteopenia   . Vegetarian diet    eats fish and eggs; no meat    Past Surgical History:  Procedure Laterality Date  . arthroscopy Right knee  03/16/03  . oophorectomy unilateral    . TOTAL KNEE ARTHROPLASTY Right 09/14/2014   Procedure: RIGHT TOTAL KNEE ARTHROPLASTY;  Surgeon: Garald Balding, MD;  Location: [facility];  Service: Orthopedics;  Laterality: Right;    There were no vitals filed for this visit.      Subjective Assessment - 02/20/16 1232    Subjective Had MRI on Saturday and it was negative in the lumbar spine.     Pertinent History Right TKR 09/2014   Patient Stated Goals Want for walk 2-3 miles without pain;  less pain and limping with rising from chair    Currently in Pain? Yes   Pain Score 2    Pain Location Knee   Pain Orientation Left   Pain Descriptors / Indicators Sore   Pain Type Chronic pain   Pain Onset More than a month ago   Pain Frequency Constant   Aggravating Factors  when getting up from the seated  position, descending stseps, walking, squatting   Pain Relieving Factors elevating legs, ice, heat                         [facility] Adult PT Treatment/Exercise - 02/20/16 0001      Knee/Hip Exercises: Stretches   Active Hamstring Stretch Both;3 reps;30 seconds   Piriformis Stretch 3 reps;20 seconds     Knee/Hip Exercises: Aerobic   Nustep L5 x 8 min     Knee/Hip Exercises: Machines for Strengthening   Total Gym Leg Press 80# bilat LEs x 30     Knee/Hip Exercises: Standing   Rocker Board 3 minutes   Rebounder Marching on mini tramp x 1 min  Then shift weight frwd/bwkwrd 1 min each way     Knee/Hip Exercises: Seated   Long Arc Quad Both;2 sets;5 reps  with adduction squeeze     Vasopneumatic   Number Minutes Vasopneumatic  10 minutes   Vasopnuematic Location  Ankle   Vasopneumatic Pressure Medium   Vasopneumatic Temperature  3 flakes                PT Education - 02/20/16 1247    Education provided Yes   Education  Details seated piriformis stretch   Person(s) Educated Patient   Methods Explanation;Demonstration;Handout   Comprehension Verbalized understanding;Returned demonstration          PT Short Term Goals - 02/20/16 1235      PT SHORT TERM GOAL #1   Title The patient will demonstrate compliance with initial HEP for ROM and initial strengthening of left knee and ankle  03/08/16   Status Achieved     PT SHORT TERM GOAL #3   Title pain with walking decreased >/= 25%   Status Achieved           PT Long Term Goals - 02/13/16 1138      PT LONG TERM GOAL #1   Title Independent with HEP needed for further improvements in ROM and strength of left knee and ankle  04/05/16   Status On-going     PT LONG TERM GOAL #2   Title The patient will have improved left hip and knee strength to grossly 4/5 needed for descending steps with greater ease   Status On-going     PT LONG TERM GOAL #3   Title The patient will have improved left ankle DF  to 7 degrees and eversion to 12 degrees needed for walking on uneven terrains with greater ease   Status On-going     PT LONG TERM GOAL #4   Title walking for 40 min or 2 miles with pain decreased >/= 60%   Status On-going     PT LONG TERM GOAL #5   Title FOTO functional outcome score improved from 55% limitation to 39% indicating improved function with less pain   Status On-going               Plan - 02/20/16 1239    Clinical Impression Statement Pt had MRI of low back and this was negative.  Pt reports that pain is reduced overall by ~25% since the start of care.  Pt is independent in HEP for flexibility.  Pt with conitnued Lt knee and ankle pain that limits her mobiltiy.  Pt will continue to benefit from skilled PT for flexiblity, strength and mobility.   Rehab Potential Good   PT Frequency 2x / week   PT Duration 8 weeks   PT Treatment/Interventions ADLs/Self Care Home Management;Cryotherapy;Electrical Stimulation;Traction;Therapeutic exercise;Patient/family education;Manual techniques;Taping;Iontophoresis 4mg /ml Dexamethasone;Dry needling;Moist Heat;Vasopneumatic Device   PT Next Visit Plan progress quad and ankle strength and proprioception   Consulted and Agree with Plan of Care Patient      Patient will benefit from skilled therapeutic intervention in order to improve the following deficits and impairments:  Pain, Decreased strength, Decreased range of motion, Difficulty walking, Impaired flexibility, Increased edema  Visit Diagnosis: Chronic pain of left knee  Pain in left ankle and joints of left foot  Stiffness of left knee, not elsewhere classified  Stiffness of left ankle, not elsewhere classified  Muscle weakness (generalized)     Problem List Patient Active Problem List   Diagnosis Date Noted  . Osteopenia 02/28/2015  . Left lumbar radiculitis 02/28/2015  . Acute blood loss anemia 09/20/2014  . Primary osteoarthritis of right knee 09/14/2014  .  Primary osteoarthritis of knee 09/14/2014  . Vitamin B 12 deficiency 07/29/2014  . Obesity (BMI 30-39.9) 12/08/2013  . Osteoarthritis, knee 05/30/2012  . Metabolic syndrome 123XX123  . DVT, HX OF 02/10/2010  . Vitamin D deficiency 08/25/2007  . Disorder of bone and cartilage 04/08/2007  . Hypothyroidism 11/18/2006  . ANEMIA-NOS 11/18/2006  .  Essential hypertension 11/18/2006  . GERD 11/18/2006    Sigurd Sos, PT 02/20/16 1:08 PM  [facility] [address], [place], [place], [address] Phone: [phone]   Fax:  [phone]  Name: Kimberly [name] MRN: [id number] Date of Birth: 1951/04/25

## 2016-02-20 NOTE — Telephone Encounter (Signed)
I reviewed MRI results with patient.  She has seen Dr Durward Fortes in past and plans to see him back again soon and will discuss results with him.

## 2016-02-22 ENCOUNTER — Encounter: Payer: Self-pay | Admitting: Physical Therapy

## 2016-02-22 ENCOUNTER — Ambulatory Visit: Payer: Medicare Other | Admitting: Physical Therapy

## 2016-02-22 DIAGNOSIS — M25662 Stiffness of left knee, not elsewhere classified: Secondary | ICD-10-CM

## 2016-02-22 DIAGNOSIS — M25672 Stiffness of left ankle, not elsewhere classified: Secondary | ICD-10-CM | POA: Diagnosis not present

## 2016-02-22 DIAGNOSIS — M25572 Pain in left ankle and joints of left foot: Secondary | ICD-10-CM

## 2016-02-22 DIAGNOSIS — M6281 Muscle weakness (generalized): Secondary | ICD-10-CM

## 2016-02-22 DIAGNOSIS — M25562 Pain in left knee: Secondary | ICD-10-CM

## 2016-02-22 DIAGNOSIS — R600 Localized edema: Secondary | ICD-10-CM

## 2016-02-22 DIAGNOSIS — G8929 Other chronic pain: Secondary | ICD-10-CM

## 2016-02-22 NOTE — Therapy (Signed)
[facility] [address], [place], [place], [address] Phone: [phone]   Fax:  [phone]  Physical Therapy Treatment  Patient Details  Name: Kimberly [name] MRN: [id number] Date of Birth: 05-04-1951 Referring Provider: Dr. Durward Fortes  Encounter Date: 02/22/2016      PT End of Session - 02/22/16 1103    Visit Number 6   Number of Visits 10   Date for PT Re-Evaluation 04/05/16   Authorization Type Medicare G codes; KX at visit 15   PT Start Time 1100   PT Stop Time 1152   PT Time Calculation (min) 52 min   Activity Tolerance Patient tolerated treatment well   Behavior During Therapy [facility] for tasks assessed/performed      Past Medical History:  Diagnosis Date  . Anemia 2005   hb 11.5  . Arthritis   . DVT (deep venous thrombosis) ([place])    2003, coumadin x 6 mon  . GERD (gastroesophageal reflux disease) 2001   no problem now  . Hypertension   . Hypothyroidism    off meds  . Osteopenia   . Vegetarian diet    eats fish and eggs; no meat    Past Surgical History:  Procedure Laterality Date  . arthroscopy Right knee  03/16/03  . oophorectomy unilateral    . TOTAL KNEE ARTHROPLASTY Right 09/14/2014   Procedure: RIGHT TOTAL KNEE ARTHROPLASTY;  Surgeon: Garald Balding, MD;  Location: [facility];  Service: Orthopedics;  Laterality: Right;    There were no vitals filed for this visit.      Subjective Assessment - 02/22/16 1101    Subjective Pt reports today is the first day she feels like shes getting better. Denies pain in ankle, stating the ankle just feels weak.    Pertinent History Right TKR 09/2014   Limitations Walking;Standing   How long can you walk comfortably? 15 min   Diagnostic tests MRI ankle    Patient Stated Goals Want for walk 2-3 miles without pain;  less pain and limping with rising from chair    Currently in Pain? Yes   Pain Score 3    Pain Location Knee   Pain Orientation Left   Pain  Descriptors / Indicators Sore   Pain Type Chronic pain   Pain Onset More than a month ago   Pain Frequency Constant   Multiple Pain Sites No                         [facility] Adult PT Treatment/Exercise - 02/22/16 0001      Knee/Hip Exercises: Stretches   Active Hamstring Stretch Both;3 reps;30 seconds   Piriformis Stretch 3 reps;20 seconds     Knee/Hip Exercises: Aerobic   Nustep L5 x 8 min     Knee/Hip Exercises: Machines for Strengthening   Total Gym Leg Press 80# bilat LEs x 30     Knee/Hip Exercises: Standing   Knee Flexion Strengthening;Both;2 sets;10 reps   Hip Abduction 2 sets;10 reps;Both   Hip Extension Stengthening;Both;2 sets;10 reps   Rebounder Marching on mini tramp x 1 min  Then shift weight frwd/bwkwrd 1 min each way     Knee/Hip Exercises: Seated   Long Arc Quad Both;2 sets;5 reps  with adduction squeeze #2      Knee/Hip Exercises: Supine   Terminal Knee Extension Strengthening;Left;10 reps;3 sets  #5   Straight Leg Raises Strengthening;10 reps;3 sets;Both     Vasopneumatic  Vasopnuematic Location  Ankle   Vasopneumatic Pressure Medium   Vasopneumatic Temperature  3 flakes                  PT Short Term Goals - 02/20/16 1235      PT SHORT TERM GOAL #1   Title The patient will demonstrate compliance with initial HEP for ROM and initial strengthening of left knee and ankle  03/08/16   Status Achieved     PT SHORT TERM GOAL #3   Title pain with walking decreased >/= 25%   Status Achieved           PT Long Term Goals - 02/13/16 1138      PT LONG TERM GOAL #1   Title Independent with HEP needed for further improvements in ROM and strength of left knee and ankle  04/05/16   Status On-going     PT LONG TERM GOAL #2   Title The patient will have improved left hip and knee strength to grossly 4/5 needed for descending steps with greater ease   Status On-going     PT LONG TERM GOAL #3   Title The patient will have  improved left ankle DF to 7 degrees and eversion to 12 degrees needed for walking on uneven terrains with greater ease   Status On-going     PT LONG TERM GOAL #4   Title walking for 40 min or 2 miles with pain decreased >/= 60%   Status On-going     PT LONG TERM GOAL #5   Title FOTO functional outcome score improved from 55% limitation to 39% indicating improved function with less pain   Status On-going               Plan - 02/22/16 1131    Clinical Impression Statement Pt reports knee beginging to feel better, believes ice has really helped. Pt is copmliant with home exercises. Able to tolerate all exercises well needing verbal cues to slow down and control the movement.  Pt will continue to benefit from skilled therapy for knee strength and stability.    Rehab Potential Good   Clinical Impairments Affecting Rehab Potential osteopenia   PT Frequency 2x / week   PT Duration 8 weeks   PT Treatment/Interventions ADLs/Self Care Home Management;Cryotherapy;Electrical Stimulation;Traction;Therapeutic exercise;Patient/family education;Manual techniques;Taping;Iontophoresis 4mg /ml Dexamethasone;Dry needling;Moist Heat;Vasopneumatic Device   PT Next Visit Plan progress quad and ankle strength and proprioception   PT Home Exercise Plan SLR; clams; gastroc stretch with towel   Consulted and Agree with Plan of Care Patient      Patient will benefit from skilled therapeutic intervention in order to improve the following deficits and impairments:  Pain, Decreased strength, Decreased range of motion, Difficulty walking, Impaired flexibility, Increased edema  Visit Diagnosis: Chronic pain of left knee  Pain in left ankle and joints of left foot  Stiffness of left knee, not elsewhere classified  Stiffness of left ankle, not elsewhere classified  Muscle weakness (generalized)  Localized edema  Acute pain of left knee     Problem List Patient Active Problem List   Diagnosis Date  Noted  . Osteopenia 02/28/2015  . Left lumbar radiculitis 02/28/2015  . Acute blood loss anemia 09/20/2014  . Primary osteoarthritis of right knee 09/14/2014  . Primary osteoarthritis of knee 09/14/2014  . Vitamin B 12 deficiency 07/29/2014  . Obesity (BMI 30-39.9) 12/08/2013  . Osteoarthritis, knee 05/30/2012  . Metabolic syndrome 123XX123  . DVT, HX OF 02/10/2010  .  Vitamin D deficiency 08/25/2007  . Disorder of bone and cartilage 04/08/2007  . Hypothyroidism 11/18/2006  . ANEMIA-NOS 11/18/2006  . Essential hypertension 11/18/2006  . GERD 11/18/2006    Mikle Bosworth PTA 02/22/2016, 4:57 PM  [facility] [address] [place], [place], [address] Phone: [phone]   Fax:  [phone]  Name: Kimberly [name] MRN: [id number] Date of Birth: 07/06/1950

## 2016-02-23 ENCOUNTER — Other Ambulatory Visit: Payer: Self-pay

## 2016-02-27 ENCOUNTER — Encounter: Payer: Self-pay | Admitting: Physical Therapy

## 2016-02-27 ENCOUNTER — Ambulatory Visit: Payer: Medicare Other | Admitting: Physical Therapy

## 2016-02-27 DIAGNOSIS — M6281 Muscle weakness (generalized): Secondary | ICD-10-CM

## 2016-02-27 DIAGNOSIS — M25572 Pain in left ankle and joints of left foot: Secondary | ICD-10-CM | POA: Diagnosis not present

## 2016-02-27 DIAGNOSIS — M25562 Pain in left knee: Secondary | ICD-10-CM | POA: Diagnosis not present

## 2016-02-27 DIAGNOSIS — M25662 Stiffness of left knee, not elsewhere classified: Secondary | ICD-10-CM | POA: Diagnosis not present

## 2016-02-27 DIAGNOSIS — G8929 Other chronic pain: Secondary | ICD-10-CM

## 2016-02-27 DIAGNOSIS — R600 Localized edema: Secondary | ICD-10-CM

## 2016-02-27 DIAGNOSIS — M25672 Stiffness of left ankle, not elsewhere classified: Secondary | ICD-10-CM | POA: Diagnosis not present

## 2016-02-27 NOTE — Therapy (Signed)
[facility] [address], [place], [place], [address] Phone: [phone]   Fax:  [phone]  Physical Therapy Treatment  Patient Details  Name: Kimberly [name] MRN: [id number] Date of Birth: Apr 20, 1951 Referring Provider: Dr. Durward Fortes  Encounter Date: 02/27/2016      PT End of Session - 02/27/16 1109    Visit Number 7   Number of Visits 10   Date for PT Re-Evaluation 04/05/16   Authorization Type Medicare G codes; KX at visit 15   PT Start Time 1100   PT Stop Time 1200   PT Time Calculation (min) 60 min   Activity Tolerance Patient tolerated treatment well   Behavior During Therapy [facility] for tasks assessed/performed      Past Medical History:  Diagnosis Date  . Anemia 2005   hb 11.5  . Arthritis   . DVT (deep venous thrombosis) ([place])    2003, coumadin x 6 mon  . GERD (gastroesophageal reflux disease) 2001   no problem now  . Hypertension   . Hypothyroidism    off meds  . Osteopenia   . Vegetarian diet    eats fish and eggs; no meat    Past Surgical History:  Procedure Laterality Date  . arthroscopy Right knee  03/16/03  . oophorectomy unilateral    . TOTAL KNEE ARTHROPLASTY Right 09/14/2014   Procedure: RIGHT TOTAL KNEE ARTHROPLASTY;  Surgeon: Garald Balding, MD;  Location: [place];  Service: Orthopedics;  Laterality: Right;    There were no vitals filed for this visit.      Subjective Assessment - 02/27/16 1107    Subjective The Game ready on my knee gives me the best results/pain relief.    Pain Score 3    Pain Location Knee   Pain Orientation Left;Posterior   Pain Descriptors / Indicators Dull;Sore   Aggravating Factors  Getting up   Pain Relieving Factors Game ready   Multiple Pain Sites No                         [facility] Adult PT Treatment/Exercise - 02/27/16 0001      Knee/Hip Exercises: Aerobic   Nustep L5 x 10 min     Knee/Hip Exercises: Machines for Strengthening    Total Gym Leg Press 80# Bil 30x, LTLE 30# 2x15  Seat #6     Knee/Hip Exercises: Standing   Rocker Board 3 minutes   Rebounder Marching on mini tramp x 1 min  Then shift weight frwd/bwkwrd 1 min each way     Knee/Hip Exercises: Seated   Long Arc Quad Both;2 sets;10 reps  with adduction squeeze #2    Long Arc Quad Weight 2 lbs.     Vasopneumatic   Number Minutes Vasopneumatic  15 minutes   Vasopnuematic Location  Knee   Vasopneumatic Pressure Medium   Vasopneumatic Temperature  3 flakes                  PT Short Term Goals - 02/20/16 1235      PT SHORT TERM GOAL #1   Title The patient will demonstrate compliance with initial HEP for ROM and initial strengthening of left knee and ankle  03/08/16   Status Achieved     PT SHORT TERM GOAL #3   Title pain with walking decreased >/= 25%   Status Achieved           PT Long Term Goals -  02/13/16 1138      PT LONG TERM GOAL #1   Title Independent with HEP needed for further improvements in ROM and strength of left knee and ankle  04/05/16   Status On-going     PT LONG TERM GOAL #2   Title The patient will have improved left hip and knee strength to grossly 4/5 needed for descending steps with greater ease   Status On-going     PT LONG TERM GOAL #3   Title The patient will have improved left ankle DF to 7 degrees and eversion to 12 degrees needed for walking on uneven terrains with greater ease   Status On-going     PT LONG TERM GOAL #4   Title walking for 40 min or 2 miles with pain decreased >/= 60%   Status On-going     PT LONG TERM GOAL #5   Title FOTO functional outcome score improved from 55% limitation to 39% indicating improved function with less pain   Status On-going               Plan - 02/27/16 1109    Clinical Impression Statement Pt is now considering that the knee replacement might be in her future, but for now she is getting some pain relief from PT: the exercises and Game ready  help th emost.    Rehab Potential Good   Clinical Impairments Affecting Rehab Potential osteopenia   PT Frequency 2x / week   PT Duration 8 weeks   PT Treatment/Interventions ADLs/Self Care Home Management;Cryotherapy;Electrical Stimulation;Traction;Therapeutic exercise;Patient/family education;Manual techniques;Taping;Iontophoresis 4mg /ml Dexamethasone;Dry needling;Moist Heat;Vasopneumatic Device   PT Next Visit Plan progress quad and ankle strength and proprioception      Patient will benefit from skilled therapeutic intervention in order to improve the following deficits and impairments:  Pain, Decreased strength, Decreased range of motion, Difficulty walking, Impaired flexibility, Increased edema  Visit Diagnosis: Chronic pain of left knee  Stiffness of left knee, not elsewhere classified  Muscle weakness (generalized)  Localized edema     Problem List Patient Active Problem List   Diagnosis Date Noted  . Osteopenia 02/28/2015  . Left lumbar radiculitis 02/28/2015  . Acute blood loss anemia 09/20/2014  . Primary osteoarthritis of right knee 09/14/2014  . Primary osteoarthritis of knee 09/14/2014  . Vitamin B 12 deficiency 07/29/2014  . Obesity (BMI 30-39.9) 12/08/2013  . Osteoarthritis, knee 05/30/2012  . Metabolic syndrome 123XX123  . DVT, HX OF 02/10/2010  . Vitamin D deficiency 08/25/2007  . Disorder of bone and cartilage 04/08/2007  . Hypothyroidism 11/18/2006  . ANEMIA-NOS 11/18/2006  . Essential hypertension 11/18/2006  . GERD 11/18/2006    Davanta Meuser, PTA 02/27/2016, 11:41 AM  [facility] [address] [place], [place], [address] Phone: [phone]   Fax:  [phone]  Name: Kimberly [name] MRN: [id number] Date of Birth: 01/18/1951

## 2016-02-29 ENCOUNTER — Encounter: Payer: Medicare Other | Admitting: Physical Therapy

## 2016-03-01 ENCOUNTER — Encounter: Payer: Self-pay | Admitting: Physical Therapy

## 2016-03-01 ENCOUNTER — Ambulatory Visit: Payer: Medicare Other | Admitting: Physical Therapy

## 2016-03-01 DIAGNOSIS — M25562 Pain in left knee: Secondary | ICD-10-CM

## 2016-03-01 DIAGNOSIS — M6281 Muscle weakness (generalized): Secondary | ICD-10-CM

## 2016-03-01 DIAGNOSIS — M25672 Stiffness of left ankle, not elsewhere classified: Secondary | ICD-10-CM

## 2016-03-01 DIAGNOSIS — M25572 Pain in left ankle and joints of left foot: Secondary | ICD-10-CM

## 2016-03-01 DIAGNOSIS — G8929 Other chronic pain: Secondary | ICD-10-CM | POA: Diagnosis not present

## 2016-03-01 DIAGNOSIS — M25662 Stiffness of left knee, not elsewhere classified: Secondary | ICD-10-CM

## 2016-03-01 DIAGNOSIS — R600 Localized edema: Secondary | ICD-10-CM

## 2016-03-01 NOTE — Therapy (Signed)
[facility] [address] [place], [place], [address] Phone: [phone]   Fax:  [phone]  Physical Therapy Treatment  Patient Details  Name: Kimberly [name] MRN: [id number] Date of Birth: 05-29-1950 Referring Provider: Dr. Durward Fortes  Encounter Date: 03/01/2016      PT End of Session - 03/01/16 1228    Visit Number 8   Number of Visits 10   Date for PT Re-Evaluation 04/05/16   Authorization Type Medicare G codes; KX at visit 15   PT Start Time 1226   PT Stop Time 1316   PT Time Calculation (min) 50 min   Activity Tolerance Patient tolerated treatment well   Behavior During Therapy [facility] for tasks assessed/performed      Past Medical History:  Diagnosis Date  . Anemia 2005   hb 11.5  . Arthritis   . DVT (deep venous thrombosis) ([place])    2003, coumadin x 6 mon  . GERD (gastroesophageal reflux disease) 2001   no problem now  . Hypertension   . Hypothyroidism    off meds  . Osteopenia   . Vegetarian diet    eats fish and eggs; no meat    Past Surgical History:  Procedure Laterality Date  . arthroscopy Right knee  03/16/03  . oophorectomy unilateral    . TOTAL KNEE ARTHROPLASTY Right 09/14/2014   Procedure: RIGHT TOTAL KNEE ARTHROPLASTY;  Surgeon: Garald Balding, MD;  Location: [place];  Service: Orthopedics;  Laterality: Right;    There were no vitals filed for this visit.      Subjective Assessment - 03/01/16 1227    Subjective Pt reports some pain in knee. Has had relief from exercises and vasocompression.    Pertinent History Right TKR 09/2014   Limitations Walking;Standing   How long can you walk comfortably? 15 min   Diagnostic tests MRI ankle    Patient Stated Goals Want for walk 2-3 miles without pain;  less pain and limping with rising from chair    Currently in Pain? Yes   Pain Score 3    Pain Location Knee   Pain Orientation Left   Pain Descriptors / Indicators Dull;Sore   Pain Type  Chronic pain   Pain Onset More than a month ago   Multiple Pain Sites No                         [facility] Adult PT Treatment/Exercise - 03/01/16 0001      Knee/Hip Exercises: Aerobic   Nustep L5 x 10 min  Therapist present to discuss treatment     Knee/Hip Exercises: Machines for Strengthening   Total Gym Leg Press 85# Bil 30x, LTLE 30# 2x15  Seat #6     Knee/Hip Exercises: Standing   Rocker Board 3 minutes   Rebounder Marching on mini tramp x 1 min  Then shift weight frwd/bwkwrd 1 min each way     Knee/Hip Exercises: Seated   Long Arc Quad Both;2 sets;10 reps  with adduction squeeze #2    Long Arc Quad Weight --  Red tband     Knee/Hip Exercises: Supine   Terminal Knee Extension Strengthening;Left;10 reps;3 sets  #5     Vasopneumatic   Number Minutes Vasopneumatic  15 minutes   Vasopnuematic Location  Knee   Vasopneumatic Pressure Medium   Vasopneumatic Temperature  3 flakes  PT Short Term Goals - 02/20/16 1235      PT SHORT TERM GOAL #1   Title The patient will demonstrate compliance with initial HEP for ROM and initial strengthening of left knee and ankle  03/08/16   Status Achieved     PT SHORT TERM GOAL #3   Title pain with walking decreased >/= 25%   Status Achieved           PT Long Term Goals - 02/13/16 1138      PT LONG TERM GOAL #1   Title Independent with HEP needed for further improvements in ROM and strength of left knee and ankle  04/05/16   Status On-going     PT LONG TERM GOAL #2   Title The patient will have improved left hip and knee strength to grossly 4/5 needed for descending steps with greater ease   Status On-going     PT LONG TERM GOAL #3   Title The patient will have improved left ankle DF to 7 degrees and eversion to 12 degrees needed for walking on uneven terrains with greater ease   Status On-going     PT LONG TERM GOAL #4   Title walking for 40 min or 2 miles with pain decreased  >/= 60%   Status On-going     PT LONG TERM GOAL #5   Title FOTO functional outcome score improved from 55% limitation to 39% indicating improved function with less pain   Status On-going               Plan - 03/01/16 1300    Clinical Impression Statement Pt continues to tolerate strengthening exercises well. Will continue to progress with standing balance and proprioception. Pt will continue to benefit from skilled therapy for LE strengthening and stability.    Rehab Potential Good   Clinical Impairments Affecting Rehab Potential osteopenia   PT Frequency 2x / week   PT Duration 8 weeks   PT Treatment/Interventions ADLs/Self Care Home Management;Cryotherapy;Electrical Stimulation;Traction;Therapeutic exercise;Patient/family education;Manual techniques;Taping;Iontophoresis 4mg /ml Dexamethasone;Dry needling;Moist Heat;Vasopneumatic Device   PT Next Visit Plan progress quad and ankle strength and proprioception   Consulted and Agree with Plan of Care Patient      Patient will benefit from skilled therapeutic intervention in order to improve the following deficits and impairments:  Pain, Decreased strength, Decreased range of motion, Difficulty walking, Impaired flexibility, Increased edema  Visit Diagnosis: Chronic pain of left knee  Stiffness of left knee, not elsewhere classified  Muscle weakness (generalized)  Localized edema  Pain in left ankle and joints of left foot  Stiffness of left ankle, not elsewhere classified  Acute pain of left knee     Problem List Patient Active Problem List   Diagnosis Date Noted  . Osteopenia 02/28/2015  . Left lumbar radiculitis 02/28/2015  . Acute blood loss anemia 09/20/2014  . Primary osteoarthritis of right knee 09/14/2014  . Primary osteoarthritis of knee 09/14/2014  . Vitamin B 12 deficiency 07/29/2014  . Obesity (BMI 30-39.9) 12/08/2013  . Osteoarthritis, knee 05/30/2012  . Metabolic syndrome 123XX123  . DVT, HX OF  02/10/2010  . Vitamin D deficiency 08/25/2007  . Disorder of bone and cartilage 04/08/2007  . Hypothyroidism 11/18/2006  . ANEMIA-NOS 11/18/2006  . Essential hypertension 11/18/2006  . GERD 11/18/2006    Mikle Bosworth PTA 03/01/2016, 1:08 PM  [facility] [address] [place], [place], [address] Phone: [phone]   Fax:  [phone]  Name:  Kimberly [name] MRN: [id number] Date of Birth: 07-19-1950

## 2016-03-05 ENCOUNTER — Encounter: Payer: Self-pay | Admitting: Physical Therapy

## 2016-03-05 ENCOUNTER — Ambulatory Visit: Payer: Medicare Other | Admitting: Physical Therapy

## 2016-03-05 DIAGNOSIS — M25562 Pain in left knee: Principal | ICD-10-CM

## 2016-03-05 DIAGNOSIS — M25662 Stiffness of left knee, not elsewhere classified: Secondary | ICD-10-CM | POA: Diagnosis not present

## 2016-03-05 DIAGNOSIS — M6281 Muscle weakness (generalized): Secondary | ICD-10-CM

## 2016-03-05 DIAGNOSIS — G8929 Other chronic pain: Secondary | ICD-10-CM | POA: Diagnosis not present

## 2016-03-05 DIAGNOSIS — R600 Localized edema: Secondary | ICD-10-CM

## 2016-03-05 DIAGNOSIS — M25672 Stiffness of left ankle, not elsewhere classified: Secondary | ICD-10-CM | POA: Diagnosis not present

## 2016-03-05 DIAGNOSIS — M25572 Pain in left ankle and joints of left foot: Secondary | ICD-10-CM | POA: Diagnosis not present

## 2016-03-05 NOTE — Therapy (Signed)
[facility] [address] [place], [place], [address] Phone: [phone]   Fax:  [phone]  Physical Therapy Treatment  Patient Details  Name: Kimberly [name] MRN: [id number] Date of Birth: November 10, 1950 Referring Provider: Dr. Durward Fortes  Encounter Date: 03/05/2016      PT End of Session - 03/05/16 1106    Visit Number 9   Number of Visits 10   Date for PT Re-Evaluation 04/05/16   Authorization Type Medicare G codes; KX at visit 15   PT Start Time 1101   PT Stop Time 1150   PT Time Calculation (min) 49 min   Activity Tolerance Patient tolerated treatment well   Behavior During Therapy [facility] for tasks assessed/performed      Past Medical History:  Diagnosis Date  . Anemia 2005   hb 11.5  . Arthritis   . DVT (deep venous thrombosis) ([place])    2003, coumadin x 6 mon  . GERD (gastroesophageal reflux disease) 2001   no problem now  . Hypertension   . Hypothyroidism    off meds  . Osteopenia   . Vegetarian diet    eats fish and eggs; no meat    Past Surgical History:  Procedure Laterality Date  . arthroscopy Right knee  03/16/03  . oophorectomy unilateral    . TOTAL KNEE ARTHROPLASTY Right 09/14/2014   Procedure: RIGHT TOTAL KNEE ARTHROPLASTY;  Surgeon: Garald Balding, MD;  Location: [place];  Service: Orthopedics;  Laterality: Right;    There were no vitals filed for this visit.      Subjective Assessment - 03/05/16 1104    Subjective Pr reports knee feeling ok today. Walked 1.5 miles yesterday and felt ok.    Pertinent History Right TKR 09/2014   Limitations Walking;Standing   Patient Stated Goals Want for walk 2-3 miles without pain;  less pain and limping with rising from chair    Currently in Pain? Yes   Pain Score 2    Pain Location Knee   Pain Orientation Left   Pain Descriptors / Indicators Dull;Sore   Pain Type Chronic pain                         [facility] Adult PT  Treatment/Exercise - 03/05/16 0001      Knee/Hip Exercises: Aerobic   Nustep L5 x 10 min  Therapist present to discuss treatment     Knee/Hip Exercises: Machines for Strengthening   Total Gym Leg Press 85# Bil 30x, LTLE 30# 2x15  Seat #6     Knee/Hip Exercises: Standing   Rocker Board 3 minutes   Rebounder Marching on mini tramp x 1 min  Then shift weight frwd/bwkwrd 1 min each way     Knee/Hip Exercises: Seated   Long Arc Quad Both;2 sets;10 reps  with adduction squeeze #2    Long Arc Quad Weight --  Red tband     Knee/Hip Exercises: Supine   Terminal Knee Extension --   Other Supine Knee/Hip Exercises 3 direction streight leg raises  flexion, abduction, adduction VC for technique     Vasopneumatic   Number Minutes Vasopneumatic  15 minutes   Vasopnuematic Location  Knee   Vasopneumatic Pressure Medium   Vasopneumatic Temperature  3 flakes                  PT Short Term Goals - 03/05/16 1107      PT SHORT TERM GOAL #  1   Title The patient will demonstrate compliance with initial HEP for ROM and initial strengthening of left knee and ankle  03/08/16   Time 4   Period Weeks   Status Achieved     PT SHORT TERM GOAL #2   Title Left ankle DF improved to 5 degrees and left knee flexion improved to 118 degrees needed for greater ease with rising from a chair   Time 4   Period Weeks   Status On-going     PT SHORT TERM GOAL #3   Title pain with walking decreased >/= 25%   Time 4   Period Weeks   Status Achieved           PT Long Term Goals - 03/05/16 1113      PT LONG TERM GOAL #1   Title Independent with HEP needed for further improvements in ROM and strength of left knee and ankle  04/05/16   Time 8   Period Weeks   Status On-going     PT LONG TERM GOAL #2   Title The patient will have improved left hip and knee strength to grossly 4/5 needed for descending steps with greater ease   Time 8   Period Weeks   Status On-going     PT LONG TERM  GOAL #3   Title The patient will have improved left ankle DF to 7 degrees and eversion to 12 degrees needed for walking on uneven terrains with greater ease   Time 8   Period Weeks   Status On-going     PT LONG TERM GOAL #4   Title walking for 40 min or 2 miles with pain decreased >/= 60%   Time 8   Period Weeks   Status On-going     PT LONG TERM GOAL #5   Title FOTO functional outcome score improved from 55% limitation to 39% indicating improved function with less pain   Time 8   Period Weeks   Status On-going               Plan - 03/05/16 1142    Clinical Impression Statement Pt is progressing well with Bil LE strength. Pt was able ot talk for 1.5 miles without increased pain. Pt will continue to benefit from skilled therapy for Bil LE strength to optimise performance of ADLs.    Rehab Potential Good   Clinical Impairments Affecting Rehab Potential osteopenia   PT Frequency 2x / week   PT Duration 8 weeks   PT Treatment/Interventions ADLs/Self Care Home Management;Cryotherapy;Electrical Stimulation;Traction;Therapeutic exercise;Patient/family education;Manual techniques;Taping;Iontophoresis 4mg /ml Dexamethasone;Dry needling;Moist Heat;Vasopneumatic Device   PT Next Visit Plan progress quad and ankle strength and proprioception   Consulted and Agree with Plan of Care Patient      Patient will benefit from skilled therapeutic intervention in order to improve the following deficits and impairments:  Pain, Decreased strength, Decreased range of motion, Difficulty walking, Impaired flexibility, Increased edema  Visit Diagnosis: Chronic pain of left knee  Stiffness of left knee, not elsewhere classified  Muscle weakness (generalized)  Localized edema     Problem List Patient Active Problem List   Diagnosis Date Noted  . Osteopenia 02/28/2015  . Left lumbar radiculitis 02/28/2015  . Acute blood loss anemia 09/20/2014  . Primary osteoarthritis of right knee  09/14/2014  . Primary osteoarthritis of knee 09/14/2014  . Vitamin B 12 deficiency 07/29/2014  . Obesity (BMI 30-39.9) 12/08/2013  . Osteoarthritis, knee 05/30/2012  . Metabolic syndrome 123XX123  .  DVT, HX OF 02/10/2010  . Vitamin D deficiency 08/25/2007  . Disorder of bone and cartilage 04/08/2007  . Hypothyroidism 11/18/2006  . ANEMIA-NOS 11/18/2006  . Essential hypertension 11/18/2006  . GERD 11/18/2006    Mikle Bosworth PTA 03/05/2016, 12:21 PM  [facility] [address] [place], [place], [address] Phone: [phone]   Fax:  [phone]  Name: Kimberly [name] MRN: [id number] Date of Birth: [birth year]

## 2016-03-07 ENCOUNTER — Encounter: Payer: Self-pay | Admitting: Physical Therapy

## 2016-03-07 ENCOUNTER — Ambulatory Visit: Payer: Medicare Other | Admitting: Physical Therapy

## 2016-03-07 DIAGNOSIS — M25662 Stiffness of left knee, not elsewhere classified: Secondary | ICD-10-CM | POA: Diagnosis not present

## 2016-03-07 DIAGNOSIS — M25572 Pain in left ankle and joints of left foot: Secondary | ICD-10-CM | POA: Diagnosis not present

## 2016-03-07 DIAGNOSIS — M25672 Stiffness of left ankle, not elsewhere classified: Secondary | ICD-10-CM

## 2016-03-07 DIAGNOSIS — M6281 Muscle weakness (generalized): Secondary | ICD-10-CM

## 2016-03-07 DIAGNOSIS — G8929 Other chronic pain: Secondary | ICD-10-CM | POA: Diagnosis not present

## 2016-03-07 DIAGNOSIS — R600 Localized edema: Secondary | ICD-10-CM

## 2016-03-07 DIAGNOSIS — M25562 Pain in left knee: Principal | ICD-10-CM

## 2016-03-07 NOTE — Therapy (Signed)
[facility] [address] [place], [place], [address] Phone: [phone]   Fax:  [phone]  Physical Therapy Treatment  Patient Details  Name: Kimberly [name] MRN: [id number] Date of Birth: Jul 16, 1950 Referring Provider: Dr. Durward Fortes  Encounter Date: 03/07/2016      PT End of Session - 03/07/16 1107    Visit Number 10   Number of Visits 10   Date for PT Re-Evaluation 04/05/16   Authorization Type Medicare G codes; KX at visit 15   PT Start Time 1101   PT Stop Time 1154   PT Time Calculation (min) 53 min   Activity Tolerance Patient tolerated treatment well   Behavior During Therapy [facility] for tasks assessed/performed      Past Medical History:  Diagnosis Date  . Anemia 2005   hb 11.5  . Arthritis   . DVT (deep venous thrombosis) ([place])    2003, coumadin x 6 mon  . GERD (gastroesophageal reflux disease) 2001   no problem now  . Hypertension   . Hypothyroidism    off meds  . Osteopenia   . Vegetarian diet    eats fish and eggs; no meat    Past Surgical History:  Procedure Laterality Date  . arthroscopy Right knee  03/16/03  . oophorectomy unilateral    . TOTAL KNEE ARTHROPLASTY Right 09/14/2014   Procedure: RIGHT TOTAL KNEE ARTHROPLASTY;  Surgeon: Garald Balding, MD;  Location: [place];  Service: Orthopedics;  Laterality: Right;    There were no vitals filed for this visit.      Subjective Assessment - 03/07/16 1105    Subjective Pt reports yesterday was first day without ankle pain. Knee feeling ok today and ankle doing good as well. Able to walk nicely this morning.    Pertinent History Right TKR 09/2014   Limitations Walking;Standing   Currently in Pain? Yes   Pain Score 1    Pain Location Knee   Pain Orientation Left   Pain Descriptors / Indicators Dull;Sore   Pain Type Chronic pain   Multiple Pain Sites No            [facility] PT Assessment - 03/07/16 0001      Observation/Other  Assessments   Focus on Therapeutic Outcomes (FOTO)  36% limited                      [facility] Adult PT Treatment/Exercise - 03/07/16 0001      Knee/Hip Exercises: Aerobic   Stationary Bike L3 x 10 minutes  Therapist present to discuss treatment     Knee/Hip Exercises: Machines for Strengthening   Total Gym Leg Press 85# Bil 30x, LTLE 30# 2x15  Seat #6     Knee/Hip Exercises: Standing   SLS Standing on blue foam   Rebounder Marching on mini tramp x 1 min  Then shift weight frwd/bwkwrd 1 min each way     Knee/Hip Exercises: Seated   Long Arc Quad Both;2 sets;10 reps  Red Tband     Knee/Hip Exercises: Supine   Terminal Knee Extension Strengthening;Left;10 reps;3 sets  #5   Other Supine Knee/Hip Exercises 3 direction streight leg raises     Vasopneumatic   Number Minutes Vasopneumatic  15 minutes   Vasopnuematic Location  Knee   Vasopneumatic Pressure Medium   Vasopneumatic Temperature  3 flakes                  PT Short Term  Goals - 03/05/16 1107      PT SHORT TERM GOAL #1   Title The patient will demonstrate compliance with initial HEP for ROM and initial strengthening of left knee and ankle  03/08/16   Time 4   Period Weeks   Status Achieved     PT SHORT TERM GOAL #2   Title Left ankle DF improved to 5 degrees and left knee flexion improved to 118 degrees needed for greater ease with rising from a chair   Time 4   Period Weeks   Status On-going     PT SHORT TERM GOAL #3   Title pain with walking decreased >/= 25%   Time 4   Period Weeks   Status Achieved           PT Long Term Goals - 03/05/16 1113      PT LONG TERM GOAL #1   Title Independent with HEP needed for further improvements in ROM and strength of left knee and ankle  04/05/16   Time 8   Period Weeks   Status On-going     PT LONG TERM GOAL #2   Title The patient will have improved left hip and knee strength to grossly 4/5 needed for descending steps with greater ease    Time 8   Period Weeks   Status On-going     PT LONG TERM GOAL #3   Title The patient will have improved left ankle DF to 7 degrees and eversion to 12 degrees needed for walking on uneven terrains with greater ease   Time 8   Period Weeks   Status On-going     PT LONG TERM GOAL #4   Title walking for 40 min or 2 miles with pain decreased >/= 60%   Time 8   Period Weeks   Status On-going     PT LONG TERM GOAL #5   Title FOTO functional outcome score improved from 55% limitation to 39% indicating improved function with less pain   Time 8   Period Weeks   Status On-going               Plan - Apr 01, 2016 1117    Clinical Impression Statement Pt strength is improving in Lt knee. Pt reports ankle feeling better from knee getting stonger. Pt FOTO score shows improvement in limitation. Pt will continue to benefit from skilled therapy for Bil LE strenght and stability.    Rehab Potential Good   Clinical Impairments Affecting Rehab Potential osteopenia   PT Frequency 2x / week   PT Duration 8 weeks   PT Treatment/Interventions ADLs/Self Care Home Management;Cryotherapy;Electrical Stimulation;Traction;Therapeutic exercise;Patient/family education;Manual techniques;Taping;Iontophoresis 4mg /ml Dexamethasone;Dry needling;Moist Heat;Vasopneumatic Device   PT Next Visit Plan progress quad and ankle strength and proprioception   Consulted and Agree with Plan of Care Patient      Patient will benefit from skilled therapeutic intervention in order to improve the following deficits and impairments:  Pain, Decreased strength, Decreased range of motion, Difficulty walking, Impaired flexibility, Increased edema  Visit Diagnosis: Chronic pain of left knee  Stiffness of left knee, not elsewhere classified  Muscle weakness (generalized)  Localized edema  Pain in left ankle and joints of left foot  Stiffness of left ankle, not elsewhere classified       G-Codes - 2016-04-01 1110     Functional Assessment Tool Used FOTO: 36% limitation   Functional Limitation Mobility: Walking and moving around   Mobility: Walking and Moving Around Current Status (  [id number]) At least 20 percent but less than 40 percent impaired, limited or restricted   Mobility: Walking and Moving Around Goal Status [phone]) At least 20 percent but less than 40 percent impaired, limited or restricted      Problem List Patient Active Problem List   Diagnosis Date Noted  . Osteopenia 02/28/2015  . Left lumbar radiculitis 02/28/2015  . Acute blood loss anemia 09/20/2014  . Primary osteoarthritis of right knee 09/14/2014  . Primary osteoarthritis of knee 09/14/2014  . Vitamin B 12 deficiency 07/29/2014  . Obesity (BMI 30-39.9) 12/08/2013  . Osteoarthritis, knee 05/30/2012  . Metabolic syndrome 123XX123  . DVT, HX OF 02/10/2010  . Vitamin D deficiency 08/25/2007  . Disorder of bone and cartilage 04/08/2007  . Hypothyroidism 11/18/2006  . ANEMIA-NOS 11/18/2006  . Essential hypertension 11/18/2006  . GERD 11/18/2006    Mikle Bosworth PTA 03/07/2016, 1:51 PM  [facility] [address], [place], [place], [address] Phone: [phone]   Fax:  [phone]  Name: Niya Arnoux MRN: [id number] Date of Birth: 07/02/1950

## 2016-03-12 ENCOUNTER — Ambulatory Visit: Payer: Medicare Other | Admitting: Physical Therapy

## 2016-03-12 ENCOUNTER — Encounter: Payer: Self-pay | Admitting: Physical Therapy

## 2016-03-12 DIAGNOSIS — M25662 Stiffness of left knee, not elsewhere classified: Secondary | ICD-10-CM

## 2016-03-12 DIAGNOSIS — G8929 Other chronic pain: Secondary | ICD-10-CM | POA: Diagnosis not present

## 2016-03-12 DIAGNOSIS — M6281 Muscle weakness (generalized): Secondary | ICD-10-CM

## 2016-03-12 DIAGNOSIS — M25672 Stiffness of left ankle, not elsewhere classified: Secondary | ICD-10-CM | POA: Diagnosis not present

## 2016-03-12 DIAGNOSIS — M25572 Pain in left ankle and joints of left foot: Secondary | ICD-10-CM | POA: Diagnosis not present

## 2016-03-12 DIAGNOSIS — M25562 Pain in left knee: Secondary | ICD-10-CM | POA: Diagnosis not present

## 2016-03-12 NOTE — Therapy (Signed)
[facility] [address] [place], [place], [address] Phone: [phone]   Fax:  [phone]  Physical Therapy Treatment  Patient Details  Name: Kimberly [name] MRN: [id number] Date of Birth: Oct 21, 1950 Referring Provider: Dr. Durward Fortes  Encounter Date: 03/12/2016      PT End of Session - 03/12/16 1103    Visit Number 11   Number of Visits 20   Date for PT Re-Evaluation 04/05/16   Authorization Type Medicare G codes; KX at visit 15   PT Start Time 1101   PT Stop Time 1158   PT Time Calculation (min) 57 min   Activity Tolerance Patient tolerated treatment well   Behavior During Therapy [facility] for tasks assessed/performed      Past Medical History:  Diagnosis Date  . Anemia 2005   hb 11.5  . Arthritis   . DVT (deep venous thrombosis) ([place])    2003, coumadin x 6 mon  . GERD (gastroesophageal reflux disease) 2001   no problem now  . Hypertension   . Hypothyroidism    off meds  . Osteopenia   . Vegetarian diet    eats fish and eggs; no meat    Past Surgical History:  Procedure Laterality Date  . arthroscopy Right knee  03/16/03  . oophorectomy unilateral    . TOTAL KNEE ARTHROPLASTY Right 09/14/2014   Procedure: RIGHT TOTAL KNEE ARTHROPLASTY;  Surgeon: Garald Balding, MD;  Location: [place];  Service: Orthopedics;  Laterality: Right;    There were no vitals filed for this visit.      Subjective Assessment - 03/12/16 1102    Subjective Pt reports feeling well today, has already been to the gym this morning.    Pertinent History Right TKR 09/2014   Limitations Walking;Standing   How long can you walk comfortably? 15 min   Diagnostic tests MRI ankle    Patient Stated Goals Want for walk 2-3 miles without pain;  less pain and limping with rising from chair    Currently in Pain? Yes   Pain Score 1    Pain Location Knee   Pain Orientation Left   Pain Descriptors / Indicators Dull;Sore   Pain Type Chronic  pain   Pain Onset More than a month ago   Pain Frequency Constant                         [facility] Adult PT Treatment/Exercise - 03/12/16 0001      Knee/Hip Exercises: Aerobic   Stationary Bike L3 x 10 minutes  Therapist present to discuss treatment     Knee/Hip Exercises: Machines for Strengthening   Cybex Knee Extension 3x10 #20   Cybex Knee Flexion 3x10 #25   Total Gym Leg Press 85# Bil 30x, LTLE 30# 2x15  Seat #6     Knee/Hip Exercises: Standing   SLS --   Rebounder Marching on mini tramp x 1 min  Then shift weight frwd/bwkwrd 1 min each way     Knee/Hip Exercises: Supine   Terminal Knee Extension Strengthening;Left;10 reps;3 sets  #5   Other Supine Knee/Hip Exercises 3 direction streight leg raises     Vasopneumatic   Number Minutes Vasopneumatic  15 minutes   Vasopnuematic Location  Knee   Vasopneumatic Pressure Medium   Vasopneumatic Temperature  3 flakes                PT Education - 03/12/16 1133  Education provided Yes   Education Details straight leg raises   Person(s) Educated Patient   Methods Explanation;Handout;Demonstration   Comprehension Verbalized understanding          PT Short Term Goals - 03/12/16 1104      PT SHORT TERM GOAL #1   Title The patient will demonstrate compliance with initial HEP for ROM and initial strengthening of left knee and ankle  03/08/16   Time 4   Period Weeks   Status Achieved     PT SHORT TERM GOAL #2   Title Left ankle DF improved to 5 degrees and left knee flexion improved to 118 degrees needed for greater ease with rising from a chair   Time 4   Period Weeks   Status On-going     PT SHORT TERM GOAL #3   Title pain with walking decreased >/= 25%   Time 4   Period Weeks   Status Achieved           PT Long Term Goals - 03/12/16 1104      PT LONG TERM GOAL #1   Title Independent with HEP needed for further improvements in ROM and strength of left knee and ankle  04/05/16    Time 8   Period Weeks   Status On-going     PT LONG TERM GOAL #2   Title The patient will have improved left hip and knee strength to grossly 4/5 needed for descending steps with greater ease   Time 8   Period Weeks   Status On-going     PT LONG TERM GOAL #3   Title The patient will have improved left ankle DF to 7 degrees and eversion to 12 degrees needed for walking on uneven terrains with greater ease   Time 8   Period Weeks   Status On-going     PT LONG TERM GOAL #4   Title walking for 40 min or 2 miles with pain decreased >/= 60%   Time 8   Period Weeks   Status On-going     PT LONG TERM GOAL #5   Title FOTO functional outcome score improved from 55% limitation to 39% indicating improved function with less pain   Time 8   Period Weeks   Status On-going               Plan - 03/12/16 1142    Clinical Impression Statement Pt continues to progress well with Lt knee strenghtening. Continue to have some pain in knee and ankle. Pt able to tolerate Knee extension and flexion machine well. Pt will continue to benefit from skilled therapy for Bil LE strenghtening.    Rehab Potential Good   Clinical Impairments Affecting Rehab Potential osteopenia   PT Frequency 2x / week   PT Duration 8 weeks   PT Treatment/Interventions ADLs/Self Care Home Management;Cryotherapy;Electrical Stimulation;Traction;Therapeutic exercise;Patient/family education;Manual techniques;Taping;Iontophoresis 4mg /ml Dexamethasone;Dry needling;Moist Heat;Vasopneumatic Device   PT Next Visit Plan progress quad and ankle strength and proprioception   Consulted and Agree with Plan of Care Patient      Patient will benefit from skilled therapeutic intervention in order to improve the following deficits and impairments:  Pain, Decreased strength, Decreased range of motion, Difficulty walking, Impaired flexibility, Increased edema  Visit Diagnosis: Chronic pain of left knee  Stiffness of left knee, not  elsewhere classified  Muscle weakness (generalized)     Problem List Patient Active Problem List   Diagnosis Date Noted  . Osteopenia 02/28/2015  .  Left lumbar radiculitis 02/28/2015  . Acute blood loss anemia 09/20/2014  . Primary osteoarthritis of right knee 09/14/2014  . Primary osteoarthritis of knee 09/14/2014  . Vitamin B 12 deficiency 07/29/2014  . Obesity (BMI 30-39.9) 12/08/2013  . Osteoarthritis, knee 05/30/2012  . Metabolic syndrome 123XX123  . DVT, HX OF 02/10/2010  . Vitamin D deficiency 08/25/2007  . Disorder of bone and cartilage 04/08/2007  . Hypothyroidism 11/18/2006  . ANEMIA-NOS 11/18/2006  . Essential hypertension 11/18/2006  . GERD 11/18/2006    Mikle Bosworth PTA 03/12/2016, 11:45 AM  [facility] [address] [place], [place], [address] Phone: [phone]   Fax:  [phone]  Name: Roshell Aceituno MRN: [id number] Date of Birth: 11-May-1951

## 2016-03-12 NOTE — Patient Instructions (Signed)
Hip Flexion / Knee Extension: Straight-Leg Raise (Eccentric)   Lie on back. Lift leg with knee straight. Slowly lower leg for 3-5 seconds. _10__ reps per set, _2__ sets per day, ___ days per week. Lower like elevator, stopping at each floor. Add ___ lbs when you achieve ___ repetitions. Rest on elbows. Rest on straight arms.  ABDUCTION: Side-Lying (Active)   Lie on left side, top leg straight. Raise top leg as far as possible. Use ___ lbs. Complete _2__ sets of _10__ repetitions.   http://gtsc.exer.us/94   (Home) Extension: Hip   With support under abdomen, tighten stomach. Lift right leg in line with body. Do not hyperextend. Alternate legs. Repeat __10__ times per set. Do __2__ sets per session.   ADDUCTION: Side-Lying (Active)   Lie on right side, with top leg bent and in front of other leg. Lift straight leg up as high as possible. Use ___ lbs. Complete _2__ sets of _10__ repetitions.   http://gtsc.exer.us/129   Copyright  VHI. All rights reserved.  Jeanie Sewer PTA [facility] [address] [place], [place] [address] Phone # [phone] Fax [phone]

## 2016-03-14 ENCOUNTER — Encounter: Payer: Self-pay | Admitting: Physical Therapy

## 2016-03-14 ENCOUNTER — Ambulatory Visit: Payer: Medicare Other | Attending: Orthopaedic Surgery | Admitting: Physical Therapy

## 2016-03-14 DIAGNOSIS — R600 Localized edema: Secondary | ICD-10-CM | POA: Insufficient documentation

## 2016-03-14 DIAGNOSIS — G8929 Other chronic pain: Secondary | ICD-10-CM | POA: Insufficient documentation

## 2016-03-14 DIAGNOSIS — M25662 Stiffness of left knee, not elsewhere classified: Secondary | ICD-10-CM | POA: Insufficient documentation

## 2016-03-14 DIAGNOSIS — M6281 Muscle weakness (generalized): Secondary | ICD-10-CM | POA: Diagnosis not present

## 2016-03-14 DIAGNOSIS — M25562 Pain in left knee: Secondary | ICD-10-CM | POA: Insufficient documentation

## 2016-03-14 NOTE — Therapy (Signed)
[facility] [address], [place], [place], [address] Phone: [phone]   Fax:  [phone]  Physical Therapy Treatment  Patient Details  Name: Kimberly [name] MRN: [id number] Date of Birth: 04/19/1951 Referring Provider: Dr. Durward Fortes  Encounter Date: 03/14/2016      PT End of Session - 03/14/16 1102    Visit Number 12   Number of Visits 20   Date for PT Re-Evaluation 04/05/16   Authorization Type Medicare G codes; KX at visit 15   PT Start Time 1059   PT Stop Time 1145   PT Time Calculation (min) 46 min   Activity Tolerance Patient tolerated treatment well   Behavior During Therapy [facility] for tasks assessed/performed      Past Medical History:  Diagnosis Date  . Anemia 2005   hb 11.5  . Arthritis   . DVT (deep venous thrombosis) ([place])    2003, coumadin x 6 mon  . GERD (gastroesophageal reflux disease) 2001   no problem now  . Hypertension   . Hypothyroidism    off meds  . Osteopenia   . Vegetarian diet    eats fish and eggs; no meat    Past Surgical History:  Procedure Laterality Date  . arthroscopy Right knee  03/16/03  . oophorectomy unilateral    . TOTAL KNEE ARTHROPLASTY Right 09/14/2014   Procedure: RIGHT TOTAL KNEE ARTHROPLASTY;  Surgeon: Garald Balding, MD;  Location: [place];  Service: Orthopedics;  Laterality: Right;    There were no vitals filed for this visit.      Subjective Assessment - 03/14/16 1101    Subjective Pt reports knees feeling better   Pertinent History Right TKR 09/2014   Limitations Walking;Standing   Currently in Pain? Yes   Pain Score 1    Pain Location Knee   Pain Orientation Left   Pain Descriptors / Indicators Dull;Sore   Pain Type Chronic pain   Multiple Pain Sites No                         [facility] Adult PT Treatment/Exercise - 03/14/16 0001      Knee/Hip Exercises: Aerobic   Stationary Bike L3 x 10 minutes  Therapist present to discuss  treatment     Knee/Hip Exercises: Machines for Strengthening   Cybex Knee Extension 3x10 #20   Cybex Knee Flexion 3x10 #25   Total Gym Leg Press 85# Bil 30x, LTLE 30# 2x15  Seat #6     Knee/Hip Exercises: Standing   Functional Squat 2 sets;10 reps   Rebounder Marching on mini tramp x 1 min  hip abduction, extension 10x     Knee/Hip Exercises: Supine   Terminal Knee Extension Strengthening;Left;10 reps;3 sets  #5   Other Supine Knee/Hip Exercises 3 direction streight leg raises     Vasopneumatic   Number Minutes Vasopneumatic  15 minutes   Vasopnuematic Location  Knee   Vasopneumatic Pressure Medium   Vasopneumatic Temperature  3 Flakes                  PT Short Term Goals - 03/12/16 1104      PT SHORT TERM GOAL #1   Title The patient will demonstrate compliance with initial HEP for ROM and initial strengthening of left knee and ankle  03/08/16   Time 4   Period Weeks   Status Achieved     PT SHORT TERM GOAL #2  Title Left ankle DF improved to 5 degrees and left knee flexion improved to 118 degrees needed for greater ease with rising from a chair   Time 4   Period Weeks   Status On-going     PT SHORT TERM GOAL #3   Title pain with walking decreased >/= 25%   Time 4   Period Weeks   Status Achieved           PT Long Term Goals - 03/12/16 1104      PT LONG TERM GOAL #1   Title Independent with HEP needed for further improvements in ROM and strength of left knee and ankle  04/05/16   Time 8   Period Weeks   Status On-going     PT LONG TERM GOAL #2   Title The patient will have improved left hip and knee strength to grossly 4/5 needed for descending steps with greater ease   Time 8   Period Weeks   Status On-going     PT LONG TERM GOAL #3   Title The patient will have improved left ankle DF to 7 degrees and eversion to 12 degrees needed for walking on uneven terrains with greater ease   Time 8   Period Weeks   Status On-going     PT LONG  TERM GOAL #4   Title walking for 40 min or 2 miles with pain decreased >/= 60%   Time 8   Period Weeks   Status On-going     PT LONG TERM GOAL #5   Title FOTO functional outcome score improved from 55% limitation to 39% indicating improved function with less pain   Time 8   Period Weeks   Status On-going               Plan - 03/14/16 1124    Clinical Impression Statement Pt reports knees feeling better today. Able to tolerate all strenghtening and balance exericses well. Pt has expressed wanting to be able to sit on the floor. Will continue to progress pt with LE strength and flexibility.    Rehab Potential Good   Clinical Impairments Affecting Rehab Potential osteopenia   PT Frequency 2x / week   PT Duration 8 weeks   PT Treatment/Interventions ADLs/Self Care Home Management;Cryotherapy;Electrical Stimulation;Traction;Therapeutic exercise;Patient/family education;Manual techniques;Taping;Iontophoresis 4mg /ml Dexamethasone;Dry needling;Moist Heat;Vasopneumatic Device   PT Next Visit Plan Progress LE strenght and flexibility      Patient will benefit from skilled therapeutic intervention in order to improve the following deficits and impairments:  Pain, Decreased strength, Decreased range of motion, Difficulty walking, Impaired flexibility, Increased edema  Visit Diagnosis: Chronic pain of left knee  Stiffness of left knee, not elsewhere classified  Muscle weakness (generalized)     Problem List Patient Active Problem List   Diagnosis Date Noted  . Osteopenia 02/28/2015  . Left lumbar radiculitis 02/28/2015  . Acute blood loss anemia 09/20/2014  . Primary osteoarthritis of right knee 09/14/2014  . Primary osteoarthritis of knee 09/14/2014  . Vitamin B 12 deficiency 07/29/2014  . Obesity (BMI 30-39.9) 12/08/2013  . Osteoarthritis, knee 05/30/2012  . Metabolic syndrome 123XX123  . DVT, HX OF 02/10/2010  . Vitamin D deficiency 08/25/2007  . Disorder of bone and  cartilage 04/08/2007  . Hypothyroidism 11/18/2006  . ANEMIA-NOS 11/18/2006  . Essential hypertension 11/18/2006  . GERD 11/18/2006    Mikle Bosworth PTA 03/14/2016, 12:14 PM  [facility] [address], [place], [place],  [id number] Phone: [phone]   Fax:  [phone]  Name: Derita Kalmbach MRN: [id number] Date of Birth: 1951-02-19

## 2016-03-15 ENCOUNTER — Other Ambulatory Visit: Payer: Self-pay | Admitting: Family Medicine

## 2016-03-15 DIAGNOSIS — Z1231 Encounter for screening mammogram for malignant neoplasm of breast: Secondary | ICD-10-CM

## 2016-03-19 ENCOUNTER — Ambulatory Visit: Payer: Medicare Other

## 2016-03-19 DIAGNOSIS — M25662 Stiffness of left knee, not elsewhere classified: Secondary | ICD-10-CM

## 2016-03-19 DIAGNOSIS — R600 Localized edema: Secondary | ICD-10-CM

## 2016-03-19 DIAGNOSIS — M6281 Muscle weakness (generalized): Secondary | ICD-10-CM

## 2016-03-19 DIAGNOSIS — M25562 Pain in left knee: Principal | ICD-10-CM

## 2016-03-19 DIAGNOSIS — G8929 Other chronic pain: Secondary | ICD-10-CM | POA: Diagnosis not present

## 2016-03-19 NOTE — Therapy (Signed)
[facility] [address] [place], [place], [address] Phone: [phone]   Fax:  [phone]  Physical Therapy Treatment  Patient Details  Name: Kimberly [name] MRN: [id number] Date of Birth: 04/10/1951 Referring Provider: Dr. Durward Fortes  Encounter Date: 03/19/2016      PT End of Session - 03/19/16 1139    Visit Number 13   Number of Visits 20   Date for PT Re-Evaluation 04/05/16   Authorization Type Medicare G codes; KX at visit 15   PT Start Time 1100   PT Stop Time 1153   PT Time Calculation (min) 53 min   Activity Tolerance Patient tolerated treatment well   Behavior During Therapy [facility] for tasks assessed/performed      Past Medical History:  Diagnosis Date  . Anemia 2005   hb 11.5  . Arthritis   . DVT (deep venous thrombosis) ([place])    2003, coumadin x 6 mon  . GERD (gastroesophageal reflux disease) 2001   no problem now  . Hypertension   . Hypothyroidism    off meds  . Osteopenia   . Vegetarian diet    eats fish and eggs; no meat    Past Surgical History:  Procedure Laterality Date  . arthroscopy Right knee  03/16/03  . oophorectomy unilateral    . TOTAL KNEE ARTHROPLASTY Right 09/14/2014   Procedure: RIGHT TOTAL KNEE ARTHROPLASTY;  Surgeon: Garald Balding, MD;  Location: [place];  Service: Orthopedics;  Laterality: Right;    There were no vitals filed for this visit.      Subjective Assessment - 03/19/16 1112    Subjective Overall Lt knee is feeling 70% better.  No pain today.  Pain comes and goes.     Patient Stated Goals Want for walk 2-3 miles without pain;  less pain and limping with rising from chair    Currently in Pain? No/denies            [facility] PT Assessment - 03/19/16 0001      ROM / Strength   AROM / PROM / Strength AROM;Strength     AROM   Left Knee Flexion 115   Left Ankle Dorsiflexion 6                     [facility] Adult PT Treatment/Exercise - 03/19/16  0001      Knee/Hip Exercises: Aerobic   Stationary Bike L3 x 10 minutes  Therapist present to discuss treatment     Knee/Hip Exercises: Machines for Strengthening   Cybex Knee Extension 3x10 15#  20# was too heavy   Cybex Knee Flexion 3x10 25#   Total Gym Leg Press 85# Bil 30x, LTLE 30# 2x15  Seat #6     Knee/Hip Exercises: Standing   Functional Squat 2 sets;10 reps   Rebounder weight shifting 3 ways x 1 min each     Knee/Hip Exercises: Supine   Other Supine Knee/Hip Exercises 3 direction straight leg raises  2x10 each     Vasopneumatic   Number Minutes Vasopneumatic  15 minutes   Vasopnuematic Location  Knee   Vasopneumatic Pressure Medium   Vasopneumatic Temperature  3 Flakes                  PT Short Term Goals - 03/19/16 1138      PT SHORT TERM GOAL #2   Title Left ankle DF improved to 5 degrees and left knee flexion improved to 118  degrees needed for greater ease with rising from a chair   Time 4   Period Weeks           PT Long Term Goals - 03/19/16 1113      PT LONG TERM GOAL #1   Title Independent with HEP needed for further improvements in ROM and strength of left knee and ankle  04/05/16   Time 8   Period Weeks   Status On-going     PT LONG TERM GOAL #4   Title walking for 40 min or 2 miles with pain decreased >/= 60%   Status Achieved               Plan - 03/19/16 1115    Clinical Impression Statement Pt reports that she feels 70% overall improvement since the start of care.  Pt is able to walk for exercise without limitation due to knee pain, this is limited by ankle pain. Pt with continued Lt hip and knee weakness and limiteda AROM.  Pt will continue to benefit form skilled PT for strength, endurance and edema management.     Rehab Potential Good   PT Frequency 2x / week   PT Duration 8 weeks   PT Treatment/Interventions ADLs/Self Care Home Management;Cryotherapy;Electrical Stimulation;Traction;Therapeutic  exercise;Patient/family education;Manual techniques;Taping;Iontophoresis 4mg /ml Dexamethasone;Dry needling;Moist Heat;Vasopneumatic Device   PT Next Visit Plan Progress LE strength and flexibility, edema management   Consulted and Agree with Plan of Care Patient      Patient will benefit from skilled therapeutic intervention in order to improve the following deficits and impairments:  Pain, Decreased strength, Decreased range of motion, Difficulty walking, Impaired flexibility, Increased edema  Visit Diagnosis: Chronic pain of left knee  Stiffness of left knee, not elsewhere classified  Muscle weakness (generalized)  Localized edema     Problem List Patient Active Problem List   Diagnosis Date Noted  . Osteopenia 02/28/2015  . Left lumbar radiculitis 02/28/2015  . Acute blood loss anemia 09/20/2014  . Primary osteoarthritis of right knee 09/14/2014  . Primary osteoarthritis of knee 09/14/2014  . Vitamin B 12 deficiency 07/29/2014  . Obesity (BMI 30-39.9) 12/08/2013  . Osteoarthritis, knee 05/30/2012  . Metabolic syndrome 123XX123  . DVT, HX OF 02/10/2010  . Vitamin D deficiency 08/25/2007  . Disorder of bone and cartilage 04/08/2007  . Hypothyroidism 11/18/2006  . ANEMIA-NOS 11/18/2006  . Essential hypertension 11/18/2006  . GERD 11/18/2006    Sigurd Sos, PT 03/19/16 11:42 AM  [facility] [address], [place], [place], [address] Phone: [phone]   Fax:  [phone]  Name: Kimberly [name] MRN: [id number] Date of Birth: 1950/09/19

## 2016-03-22 ENCOUNTER — Ambulatory Visit: Payer: Medicare Other | Admitting: Physical Therapy

## 2016-03-22 ENCOUNTER — Encounter: Payer: Self-pay | Admitting: Physical Therapy

## 2016-03-22 DIAGNOSIS — M25562 Pain in left knee: Principal | ICD-10-CM

## 2016-03-22 DIAGNOSIS — M6281 Muscle weakness (generalized): Secondary | ICD-10-CM

## 2016-03-22 DIAGNOSIS — G8929 Other chronic pain: Secondary | ICD-10-CM

## 2016-03-22 DIAGNOSIS — M25662 Stiffness of left knee, not elsewhere classified: Secondary | ICD-10-CM

## 2016-03-22 DIAGNOSIS — R600 Localized edema: Secondary | ICD-10-CM

## 2016-03-22 NOTE — Therapy (Signed)
[facility] [address], [place], [place], [address] Phone: [phone]   Fax:  [phone]  Physical Therapy Treatment  Patient Details  Name: Kimberly [name] MRN: [id number] Date of Birth: Jun 09, 1950 Referring Provider: Dr. Durward Fortes  Encounter Date: 03/22/2016      PT End of Session - 03/22/16 1615    Visit Number 14   Number of Visits 20   Date for PT Re-Evaluation 04/05/16   Authorization Type Medicare G codes; KX at visit 15   PT Start Time 1613   PT Stop Time 1703   PT Time Calculation (min) 50 min   Activity Tolerance Patient tolerated treatment well   Behavior During Therapy [facility] for tasks assessed/performed      Past Medical History:  Diagnosis Date  . Anemia 2005   hb 11.5  . Arthritis   . DVT (deep venous thrombosis) ([place])    2003, coumadin x 6 mon  . GERD (gastroesophageal reflux disease) 2001   no problem now  . Hypertension   . Hypothyroidism    off meds  . Osteopenia   . Vegetarian diet    eats fish and eggs; no meat    Past Surgical History:  Procedure Laterality Date  . arthroscopy Right knee  03/16/03  . oophorectomy unilateral    . TOTAL KNEE ARTHROPLASTY Right 09/14/2014   Procedure: RIGHT TOTAL KNEE ARTHROPLASTY;  Surgeon: Garald Balding, MD;  Location: [place];  Service: Orthopedics;  Laterality: Right;    There were no vitals filed for this visit.      Subjective Assessment - 03/22/16 1613    Subjective Pt reports knee feeling ok, ankle is giving more trouble   Pertinent History Right TKR 09/2014   Limitations Walking;Standing   Currently in Pain? No/denies   Multiple Pain Sites Yes   Pain Score 4   Pain Location Ankle   Pain Orientation Left   Pain Type Chronic pain   Pain Onset More than a month ago   Pain Frequency Constant                         [facility] Adult PT Treatment/Exercise - 03/22/16 0001      Knee/Hip Exercises: Aerobic   Nustep L3 x 10  minutes  Therapist present to discuss treatment     Knee/Hip Exercises: Machines for Strengthening   Total Gym Leg Press 85# Bil 30x, LTLE 30# 2x15  Seat #6     Knee/Hip Exercises: Standing   Lateral Step Up 1 set;Both;10 reps;Hand Hold: 1;Step Height: 4"   Functional Squat 2 sets;10 reps   SLS On blue foam  Bil 30 seconds; single leg 30 seconds     Knee/Hip Exercises: Supine   Other Supine Knee/Hip Exercises 3 direction straight leg raises  2x10 each     Vasopneumatic   Number Minutes Vasopneumatic  15 minutes   Vasopnuematic Location  Ankle   Vasopneumatic Pressure Medium   Vasopneumatic Temperature  3 flakes     Ankle Exercises: Stretches   Gastroc Stretch 2 reps;10 seconds  Bil                  PT Short Term Goals - 03/19/16 1138      PT SHORT TERM GOAL #2   Title Left ankle DF improved to 5 degrees and left knee flexion improved to 118 degrees needed for greater ease with rising from a chair   Time  4   Period Weeks           PT Long Term Goals - 03/19/16 1113      PT LONG TERM GOAL #1   Title Independent with HEP needed for further improvements in ROM and strength of left knee and ankle  04/05/16   Time 8   Period Weeks   Status On-going     PT LONG TERM GOAL #4   Title walking for 40 min or 2 miles with pain decreased >/= 60%   Status Achieved               Plan - 03/22/16 1624    Clinical Impression Statement Pt reports knee feeling better. PT has been doing exercises at gym independently. Pt reports getting the most relief from vasoneumatic. Pt will continue to benefit from skilled therapy for LE strenth and stability.    Rehab Potential Good   Clinical Impairments Affecting Rehab Potential osteopenia   PT Frequency 2x / week   PT Duration 8 weeks   PT Treatment/Interventions ADLs/Self Care Home Management;Cryotherapy;Electrical Stimulation;Traction;Therapeutic exercise;Patient/family education;Manual techniques;Taping;Iontophoresis  4mg /ml Dexamethasone;Dry needling;Moist Heat;Vasopneumatic Device   PT Next Visit Plan Progress LE strength and flexibility, edema management   Consulted and Agree with Plan of Care Patient      Patient will benefit from skilled therapeutic intervention in order to improve the following deficits and impairments:  Pain, Decreased strength, Decreased range of motion, Difficulty walking, Impaired flexibility, Increased edema  Visit Diagnosis: Chronic pain of left knee  Stiffness of left knee, not elsewhere classified  Muscle weakness (generalized)  Localized edema     Problem List Patient Active Problem List   Diagnosis Date Noted  . Osteopenia 02/28/2015  . Left lumbar radiculitis 02/28/2015  . Acute blood loss anemia 09/20/2014  . Primary osteoarthritis of right knee 09/14/2014  . Primary osteoarthritis of knee 09/14/2014  . Vitamin B 12 deficiency 07/29/2014  . Obesity (BMI 30-39.9) 12/08/2013  . Osteoarthritis, knee 05/30/2012  . Metabolic syndrome 123XX123  . DVT, HX OF 02/10/2010  . Vitamin D deficiency 08/25/2007  . Disorder of bone and cartilage 04/08/2007  . Hypothyroidism 11/18/2006  . ANEMIA-NOS 11/18/2006  . Essential hypertension 11/18/2006  . GERD 11/18/2006    Mikle Bosworth, PTA 03/22/16 5:02 PM   [facility] [address], [place], [place], [address] Phone: [phone]   Fax:  [phone]  Name: Kimberly [name] MRN: [id number] Date of Birth: [birth year]

## 2016-03-26 ENCOUNTER — Encounter: Payer: Medicare Other | Admitting: Physical Therapy

## 2016-03-27 ENCOUNTER — Ambulatory Visit: Payer: Medicare Other

## 2016-03-27 ENCOUNTER — Other Ambulatory Visit (INDEPENDENT_AMBULATORY_CARE_PROVIDER_SITE_OTHER): Payer: Medicare Other

## 2016-03-27 DIAGNOSIS — M25662 Stiffness of left knee, not elsewhere classified: Secondary | ICD-10-CM | POA: Diagnosis not present

## 2016-03-27 DIAGNOSIS — R600 Localized edema: Secondary | ICD-10-CM

## 2016-03-27 DIAGNOSIS — Z0000 Encounter for general adult medical examination without abnormal findings: Secondary | ICD-10-CM | POA: Diagnosis not present

## 2016-03-27 DIAGNOSIS — M6281 Muscle weakness (generalized): Secondary | ICD-10-CM | POA: Diagnosis not present

## 2016-03-27 DIAGNOSIS — G8929 Other chronic pain: Secondary | ICD-10-CM | POA: Diagnosis not present

## 2016-03-27 DIAGNOSIS — M25562 Pain in left knee: Secondary | ICD-10-CM | POA: Diagnosis not present

## 2016-03-27 LAB — CBC WITH DIFFERENTIAL/PLATELET
BASOS ABS: 0 10*3/uL (ref 0.0–0.1)
Basophils Relative: 0.4 % (ref 0.0–3.0)
EOS PCT: 1.6 % (ref 0.0–5.0)
Eosinophils Absolute: 0.1 10*3/uL (ref 0.0–0.7)
HCT: 36 % (ref 36.0–46.0)
Hemoglobin: 12.2 g/dL (ref 12.0–15.0)
LYMPHS ABS: 2 10*3/uL (ref 0.7–4.0)
Lymphocytes Relative: 40.9 % (ref 12.0–46.0)
MCHC: 33.8 g/dL (ref 30.0–36.0)
MCV: 89.5 fl (ref 78.0–100.0)
MONO ABS: 0.4 10*3/uL (ref 0.1–1.0)
Monocytes Relative: 8 % (ref 3.0–12.0)
NEUTROS ABS: 2.4 10*3/uL (ref 1.4–7.7)
NEUTROS PCT: 49.1 % (ref 43.0–77.0)
PLATELETS: 267 10*3/uL (ref 150.0–400.0)
RBC: 4.02 Mil/uL (ref 3.87–5.11)
RDW: 13.7 % (ref 11.5–15.5)
WBC: 4.8 10*3/uL (ref 4.0–10.5)

## 2016-03-27 LAB — HEPATIC FUNCTION PANEL
ALT: 20 U/L (ref 0–35)
AST: 20 U/L (ref 0–37)
Albumin: 3.8 g/dL (ref 3.5–5.2)
Alkaline Phosphatase: 59 U/L (ref 39–117)
BILIRUBIN TOTAL: 0.7 mg/dL (ref 0.2–1.2)
Bilirubin, Direct: 0.1 mg/dL (ref 0.0–0.3)
Total Protein: 7.2 g/dL (ref 6.0–8.3)

## 2016-03-27 LAB — BASIC METABOLIC PANEL
BUN: 10 mg/dL (ref 6–23)
CALCIUM: 9.3 mg/dL (ref 8.4–10.5)
CO2: 25 meq/L (ref 19–32)
Chloride: 105 mEq/L (ref 96–112)
Creatinine, Ser: 0.59 mg/dL (ref 0.40–1.20)
GFR: 107.92 mL/min (ref >60.00)
GLUCOSE: 104 mg/dL — AB (ref 70–99)
Potassium: 3.8 mEq/L (ref 3.5–5.1)
Sodium: 137 mEq/L (ref 135–145)

## 2016-03-27 LAB — LIPID PANEL
CHOLESTEROL: 166 mg/dL (ref 0–200)
HDL: 47.4 mg/dL (ref >39.00)
LDL CALC: 104 mg/dL — AB (ref 0–99)
NonHDL: 118.99
Total CHOL/HDL Ratio: 4
Triglycerides: 75 mg/dL (ref 0.0–149.0)
VLDL: 15 mg/dL (ref 0.0–40.0)

## 2016-03-27 LAB — HEMOGLOBIN A1C: Hgb A1c MFr Bld: 5.9 % (ref 4.6–6.5)

## 2016-03-27 LAB — TSH: TSH: 1.65 u[IU]/mL (ref 0.35–4.50)

## 2016-03-27 NOTE — Therapy (Signed)
[facility] [address] [place], [place], [address] Phone: [phone]   Fax:  [phone]  Physical Therapy Treatment  Patient Details  Name: Kimberly [name] MRN: [id number] Date of Birth: 1950/11/21 Referring Provider: Dr. Durward Fortes  Encounter Date: 03/27/2016      PT End of Session - 03/27/16 1131    Visit Number 15   Number of Visits 20   Date for PT Re-Evaluation 04/05/16   Authorization Type Medicare G codes; KX at visit 15   PT Start Time 1100   PT Stop Time 1146   PT Time Calculation (min) 46 min   Activity Tolerance Patient tolerated treatment well   Behavior During Therapy [facility] for tasks assessed/performed      Past Medical History:  Diagnosis Date  . Anemia 2005   hb 11.5  . Arthritis   . DVT (deep venous thrombosis) ([place])    2003, coumadin x 6 mon  . GERD (gastroesophageal reflux disease) 2001   no problem now  . Hypertension   . Hypothyroidism    off meds  . Osteopenia   . Vegetarian diet    eats fish and eggs; no meat    Past Surgical History:  Procedure Laterality Date  . arthroscopy Right knee  03/16/03  . oophorectomy unilateral    . TOTAL KNEE ARTHROPLASTY Right 09/14/2014   Procedure: RIGHT TOTAL KNEE ARTHROPLASTY;  Surgeon: Garald Balding, MD;  Location: [place];  Service: Orthopedics;  Laterality: Right;    There were no vitals filed for this visit.      Subjective Assessment - 03/27/16 1105    Subjective Increased pain in ankle today.     Patient Stated Goals Want for walk 2-3 miles without pain;  less pain and limping with rising from chair    Currently in Pain? No/denies                         [facility] Adult PT Treatment/Exercise - 03/27/16 0001      Knee/Hip Exercises: Aerobic   Nustep L3 x 10 minutes  Therapist present to discuss treatment     Knee/Hip Exercises: Machines for Strengthening   Total Gym Leg Press 85# Bil 30x, LTLE 35# 2x15  Seat #6      Knee/Hip Exercises: Standing   Lateral Step Up 1 set;Both;10 reps;Hand Hold: 1;Step Height: 6"   Functional Squat 2 sets;10 reps   Rebounder weight shifting 3 ways x 1 min each     Knee/Hip Exercises: Supine   Other Supine Knee/Hip Exercises 3 direction straight leg raises  2x10 each     Vasopneumatic   Number Minutes Vasopneumatic  15 minutes   Vasopnuematic Location  Ankle   Vasopneumatic Pressure Medium   Vasopneumatic Temperature  3 flakes     Ankle Exercises: Stretches   Gastroc Stretch 2 reps;10 seconds  Bil                  PT Short Term Goals - 03/19/16 1138      PT SHORT TERM GOAL #2   Title Left ankle DF improved to 5 degrees and left knee flexion improved to 118 degrees needed for greater ease with rising from a chair   Time 4   Period Weeks           PT Long Term Goals - 03/27/16 1106      PT LONG TERM GOAL #1   Title Independent  with HEP needed for further improvements in ROM and strength of left knee and ankle  04/05/16   Time 8   Period Weeks   Status On-going     PT LONG TERM GOAL #2   Title The patient will have improved left hip and knee strength to grossly 4/5 needed for descending steps with greater ease   Time 8   Period Weeks   Status On-going     PT LONG TERM GOAL #4   Title walking for 40 min or 2 miles with pain decreased >/= 60%   Status Achieved     PT LONG TERM GOAL #5   Title FOTO functional outcome score improved from 55% limitation to 39% indicating improved function with less pain   Time 8   Period Weeks   Status On-going               Plan - 03/27/16 1108    Clinical Impression Statement Pt reports that things are overall 70-75% better since the start of care.  Pt reports minimal to no problems with Lt knee.  Lt ankle remains intermittently painful.  Lt ankle pain and stiffness are worse in the morning hours. Pt tolerated advancement of exercise today.  Pt will benefit from 2 more PT sessions to  finalize HEP and continue to address Lt ankle edema.     Rehab Potential Good   PT Frequency 2x / week   PT Duration 8 weeks   PT Treatment/Interventions ADLs/Self Care Home Management;Cryotherapy;Electrical Stimulation;Traction;Therapeutic exercise;Patient/family education;Manual techniques;Taping;Iontophoresis 4mg /ml Dexamethasone;Dry needling;Moist Heat;Vasopneumatic Device   PT Next Visit Plan Progress LE strength and flexibility, edema management   Consulted and Agree with Plan of Care Patient      Patient will benefit from skilled therapeutic intervention in order to improve the following deficits and impairments:  Pain, Decreased strength, Decreased range of motion, Difficulty walking, Impaired flexibility, Increased edema  Visit Diagnosis: Chronic pain of left knee  Stiffness of left knee, not elsewhere classified  Muscle weakness (generalized)  Localized edema     Problem List Patient Active Problem List   Diagnosis Date Noted  . Osteopenia 02/28/2015  . Left lumbar radiculitis 02/28/2015  . Acute blood loss anemia 09/20/2014  . Primary osteoarthritis of right knee 09/14/2014  . Primary osteoarthritis of knee 09/14/2014  . Vitamin B 12 deficiency 07/29/2014  . Obesity (BMI 30-39.9) 12/08/2013  . Osteoarthritis, knee 05/30/2012  . Metabolic syndrome 123XX123  . DVT, HX OF 02/10/2010  . Vitamin D deficiency 08/25/2007  . Disorder of bone and cartilage 04/08/2007  . Hypothyroidism 11/18/2006  . ANEMIA-NOS 11/18/2006  . Essential hypertension 11/18/2006  . GERD 11/18/2006    Sigurd Sos, PT 03/27/16 11:33 AM  [facility] [address] [place], [place], [address] Phone: [phone]   Fax:  [phone]  Name: Kimberly [name] MRN: [id number] Date of Birth: 1950-08-17

## 2016-03-29 ENCOUNTER — Encounter: Payer: Self-pay | Admitting: Physical Therapy

## 2016-03-29 ENCOUNTER — Ambulatory Visit: Payer: Medicare Other | Admitting: Physical Therapy

## 2016-03-29 DIAGNOSIS — R600 Localized edema: Secondary | ICD-10-CM | POA: Diagnosis not present

## 2016-03-29 DIAGNOSIS — M25662 Stiffness of left knee, not elsewhere classified: Secondary | ICD-10-CM | POA: Diagnosis not present

## 2016-03-29 DIAGNOSIS — G8929 Other chronic pain: Secondary | ICD-10-CM | POA: Diagnosis not present

## 2016-03-29 DIAGNOSIS — M25562 Pain in left knee: Principal | ICD-10-CM

## 2016-03-29 DIAGNOSIS — M6281 Muscle weakness (generalized): Secondary | ICD-10-CM

## 2016-03-29 NOTE — Therapy (Signed)
[facility] [address] [place], [place], [address] Phone: [phone]   Fax:  [phone]  Physical Therapy Treatment  Patient Details  Name: Kimberly [name] MRN: [id number] Date of Birth: [birth year] Referring Provider: Dr. Durward Fortes  Encounter Date: 03/29/2016      PT End of Session - 03/29/16 1110    Visit Number 16   Number of Visits 20   Date for PT Re-Evaluation 04/05/16   PT Start Time 1058   PT Stop Time 1148   PT Time Calculation (min) 50 min   Activity Tolerance Patient tolerated treatment well   Behavior During Therapy [facility] for tasks assessed/performed      Past Medical History:  Diagnosis Date  . Anemia 2005   hb 11.5  . Arthritis   . DVT (deep venous thrombosis) ([place])    2003, coumadin x 6 mon  . GERD (gastroesophageal reflux disease) 2001   no problem now  . Hypertension   . Hypothyroidism    off meds  . Osteopenia   . Vegetarian diet    eats fish and eggs; no meat    Past Surgical History:  Procedure Laterality Date  . arthroscopy Right knee  03/16/03  . oophorectomy unilateral    . TOTAL KNEE ARTHROPLASTY Right 09/14/2014   Procedure: RIGHT TOTAL KNEE ARTHROPLASTY;  Surgeon: Garald Balding, MD;  Location: [place];  Service: Orthopedics;  Laterality: Right;    There were no vitals filed for this visit.      Subjective Assessment - 03/29/16 1112    Subjective Pt reports knee feeling pretty good and ankle feeling ok.    Pertinent History Right TKR 09/2014   Limitations Walking;Standing   Currently in Pain? Yes   Pain Score 2    Pain Location Knee   Pain Orientation Left   Pain Descriptors / Indicators Dull;Sore   Pain Type Chronic pain   Pain Frequency Constant   Aggravating Factors  Getting up   Pain Relieving Factors Game ready                         [facility] Adult PT Treatment/Exercise - 03/29/16 0001      Knee/Hip Exercises: Aerobic   Nustep L3 x 10  minutes  Therapist present to discuss treatment     Knee/Hip Exercises: Machines for Strengthening   Total Gym Leg Press 85# Bil 30x, LTLE 35# 2x15  Seat #6     Knee/Hip Exercises: Standing   Lateral Step Up Both;2 sets;10 reps;Step Height: 6"   Functional Squat 2 sets;10 reps   Rebounder Marching, weight shifting 3 ways x 1 min each     Knee/Hip Exercises: Supine   Terminal Knee Extension Strengthening;Both;2 sets;10 reps  #6   Other Supine Knee/Hip Exercises 4 direction straight leg raises  2x10 each     Vasopneumatic   Number Minutes Vasopneumatic  15 minutes   Vasopnuematic Location  Knee   Vasopneumatic Pressure Medium   Vasopneumatic Temperature  3 flakes                  PT Short Term Goals - 03/19/16 1138      PT SHORT TERM GOAL #2   Title Left ankle DF improved to 5 degrees and left knee flexion improved to 118 degrees needed for greater ease with rising from a chair   Time 4   Period Weeks  PT Long Term Goals - 03/27/16 1106      PT LONG TERM GOAL #1   Title Independent with HEP needed for further improvements in ROM and strength of left knee and ankle  04/05/16   Time 8   Period Weeks   Status On-going     PT LONG TERM GOAL #2   Title The patient will have improved left hip and knee strength to grossly 4/5 needed for descending steps with greater ease   Time 8   Period Weeks   Status On-going     PT LONG TERM GOAL #4   Title walking for 40 min or 2 miles with pain decreased >/= 60%   Status Achieved     PT LONG TERM GOAL #5   Title FOTO functional outcome score improved from 55% limitation to 39% indicating improved function with less pain   Time 8   Period Weeks   Status On-going               Plan - 03/29/16 1141    Clinical Impression Statement Pt reports knee feeling better, ankle has no pain today. Pt able to tolerate all strengthening exercises well and continues to be challenged by current level of resistance  on all exercises. Pt is progress well with balance and Bil knee stability. Pt will contiue to benefit from skilled therapy to increase activity tolerance with less pain.    Rehab Potential Good   Clinical Impairments Affecting Rehab Potential osteopenia   PT Frequency 2x / week   PT Duration 8 weeks   PT Treatment/Interventions ADLs/Self Care Home Management;Cryotherapy;Electrical Stimulation;Traction;Therapeutic exercise;Patient/family education;Manual techniques;Taping;Iontophoresis 4mg /ml Dexamethasone;Dry needling;Moist Heat;Vasopneumatic Device   PT Next Visit Plan Progress LE strength and flexibility, edema management   Consulted and Agree with Plan of Care Patient      Patient will benefit from skilled therapeutic intervention in order to improve the following deficits and impairments:  Pain, Decreased strength, Decreased range of motion, Difficulty walking, Impaired flexibility, Increased edema  Visit Diagnosis: Chronic pain of left knee  Stiffness of left knee, not elsewhere classified  Muscle weakness (generalized)  Localized edema     Problem List Patient Active Problem List   Diagnosis Date Noted  . Osteopenia 02/28/2015  . Left lumbar radiculitis 02/28/2015  . Acute blood loss anemia 09/20/2014  . Primary osteoarthritis of right knee 09/14/2014  . Primary osteoarthritis of knee 09/14/2014  . Vitamin B 12 deficiency 07/29/2014  . Obesity (BMI 30-39.9) 12/08/2013  . Osteoarthritis, knee 05/30/2012  . Metabolic syndrome 123XX123  . DVT, HX OF 02/10/2010  . Vitamin D deficiency 08/25/2007  . Disorder of bone and cartilage 04/08/2007  . Hypothyroidism 11/18/2006  . ANEMIA-NOS 11/18/2006  . Essential hypertension 11/18/2006  . GERD 11/18/2006    Mikle Bosworth PTA 03/29/2016, 11:45 AM  [facility] [address] [place], [place], [address] Phone: [phone]   Fax:  [phone]  Name:  Kimberly [name] MRN: [id number] Date of Birth: 11-20-1950

## 2016-04-02 ENCOUNTER — Ambulatory Visit: Payer: Medicare Other

## 2016-04-02 ENCOUNTER — Encounter: Payer: Medicare Other | Admitting: Family Medicine

## 2016-04-02 DIAGNOSIS — M25662 Stiffness of left knee, not elsewhere classified: Secondary | ICD-10-CM | POA: Diagnosis not present

## 2016-04-02 DIAGNOSIS — M6281 Muscle weakness (generalized): Secondary | ICD-10-CM | POA: Diagnosis not present

## 2016-04-02 DIAGNOSIS — M25562 Pain in left knee: Secondary | ICD-10-CM | POA: Diagnosis not present

## 2016-04-02 DIAGNOSIS — G8929 Other chronic pain: Secondary | ICD-10-CM

## 2016-04-02 DIAGNOSIS — R600 Localized edema: Secondary | ICD-10-CM | POA: Diagnosis not present

## 2016-04-02 NOTE — Therapy (Signed)
[name] [address], [place], [place], [address] Phone: [phone]   Fax:  [phone]  Physical Therapy Treatment  Patient Details  Name: Kimberly [name] MRN: [id number] Date of Birth: 1951/03/03 Referring Provider: Dr. Durward Fortes  Encounter Date: 04/02/2016      PT End of Session - 04/02/16 1052    Visit Number [id number]   PT Start Time 1019   PT Stop Time 1105   PT Time Calculation (min) 46 min   Activity Tolerance Patient tolerated treatment well   Behavior During Therapy [facility] for tasks assessed/performed      Past Medical History:  Diagnosis Date  . Anemia 2005   hb 11.5  . Arthritis   . DVT (deep venous thrombosis) ([place])    2003, coumadin x 6 mon  . GERD (gastroesophageal reflux disease) 2001   no problem now  . Hypertension   . Hypothyroidism    off meds  . Osteopenia   . Vegetarian diet    eats fish and eggs; no meat    Past Surgical History:  Procedure Laterality Date  . arthroscopy Right knee  03/16/03  . oophorectomy unilateral    . TOTAL KNEE ARTHROPLASTY Right 09/14/2014   Procedure: RIGHT TOTAL KNEE ARTHROPLASTY;  Surgeon: Garald Balding, MD;  Location: [place];  Service: Orthopedics;  Laterality: Right;    There were no vitals filed for this visit.      Subjective Assessment - 04/02/16 1037    Subjective Ready for D/C to HEP.  Going to [place] for a trip soon.  Will continue with HEP for continued strength gains.   Pertinent History Right TKR 09/2014   Patient Stated Goals Want for walk 2-3 miles without pain;  less pain and limping with rising from chair    Currently in Pain? Yes   Pain Score 2    Pain Location Knee   Pain Orientation Left   Pain Descriptors / Indicators Dull;Sore   Pain Type Chronic pain   Pain Onset More than a month ago   Pain Frequency Constant   Aggravating Factors  walking, getting up   Pain Relieving Factors rest, Game Ready   Pain Score 0   Pain Location  Ankle   Pain Orientation Left   Pain Descriptors / Indicators Aching            [facility] PT Assessment - 04/02/16 0001      Assessment   Medical Diagnosis left ankle and left knee arthritis     Observation/Other Assessments   Focus on Therapeutic Outcomes (FOTO)  31% limitation     AROM   Left Knee Flexion 115   Left Ankle Dorsiflexion 11   Left Ankle Eversion 20     Strength   Strength Assessment Site Hip   Right/Left Hip Left   Left Hip Flexion 4/5   Left Hip Extension 4+/5   Left Hip ABduction 4/5                     [facility] Adult PT Treatment/Exercise - 04/02/16 0001      Knee/Hip Exercises: Aerobic   Nustep L3 x 10 minutes  Therapist present to discuss treatment     Knee/Hip Exercises: Machines for Strengthening   Total Gym Leg Press 85# Bil 30x, Lt LE 35# 2x15  Seat #6     Knee/Hip Exercises: Standing   Lateral Step Up Both;2 sets;10 reps;Step Height: 6"   Functional Squat 2 sets;10  reps   [employer], weight shifting 3 ways x 1 min each     Knee/Hip Exercises: Supine   Other Supine Knee/Hip Exercises 4 direction straight leg raises  2x10 each     Vasopneumatic   Number Minutes Vasopneumatic  15 minutes   Vasopnuematic Location  Knee   Vasopneumatic Pressure Medium   Vasopneumatic Temperature  3 flakes                  PT Short Term Goals - 03/19/16 1138      PT SHORT TERM GOAL #2   Title Left ankle DF improved to 5 degrees and left knee flexion improved to 118 degrees needed for greater ease with rising from a chair   Time 4   Period Weeks           PT Long Term Goals - 2016/04/17 1026      PT LONG TERM GOAL #1   Title Independent with HEP needed for further improvements in ROM and strength of left knee and ankle  04/05/16   Status Achieved     PT LONG TERM GOAL #2   Title The patient will have improved left hip and knee strength to grossly 4/5 needed for descending steps with greater ease   Status Achieved      PT LONG TERM GOAL #3   Title The patient will have improved left ankle DF to 7 degrees and eversion to 12 degrees needed for walking on uneven terrains with greater ease   Status Achieved     PT LONG TERM GOAL #4   Title walking for 40 min or 2 miles with pain decreased >/= 60%   Status Achieved     PT LONG TERM GOAL #5   Title FOTO functional outcome score improved from 55% limitation to 39% indicating improved function with less pain   Status Achieved               Plan - Apr 17, 2016 1038    Clinical Impression Statement Pt is ready for D/C to HEP.  Pt has a HEP in place for strength and endurance exercises.  Pt is able to walk for 1 hour in the community without pain limitation and has improved hip and knee strength since evaluation.     PT Next Visit Plan D/C PT to HEP today.    Consulted and Agree with Plan of Care Patient      Patient will benefit from skilled therapeutic intervention in order to improve the following deficits and impairments:     Visit Diagnosis: Chronic pain of left knee  Stiffness of left knee, not elsewhere classified  Muscle weakness (generalized)  Localized edema       G-Codes - 2016/04/17 1025    Functional Assessment Tool Used FOTO: 31% limitation   Functional Limitation Mobility: Walking and moving around   Mobility: Walking and Moving Around Goal Status [phone]) At least 20 percent but less than 40 percent impaired, limited or restricted   Mobility: Walking and Moving Around Discharge Status [phone]) At least 20 percent but less than 40 percent impaired, limited or restricted      Problem List Patient Active Problem List   Diagnosis Date Noted  . Osteopenia 02/28/2015  . Left lumbar radiculitis 02/28/2015  . Acute blood loss anemia 09/20/2014  . Primary osteoarthritis of right knee 09/14/2014  . Primary osteoarthritis of knee 09/14/2014  . Vitamin B 12 deficiency 07/29/2014  . Obesity (BMI 30-39.9) 12/08/2013  .  Osteoarthritis,  knee 05/30/2012  . Metabolic syndrome 32/20/1992  . DVT, HX OF 02/10/2010  . Vitamin D deficiency 08/25/2007  . Disorder of bone and cartilage 04/08/2007  . Hypothyroidism 11/18/2006  . ANEMIA-NOS 11/18/2006  . Essential hypertension 11/18/2006  . GERD 11/18/2006   PHYSICAL THERAPY DISCHARGE SUMMARY  Visits from Start of Care: 17  Current functional level related to goals / functional outcomes: See above for current status.  Pt has HEP in place and will continue to build strength at home.     Remaining deficits: See above   Education / Equipment: HEP Plan: Patient agrees to discharge.  Patient goals were met. Patient is being discharged due to meeting the stated rehab goals.  ?????         Sigurd Sos, PT 04/02/16 10:56 AM  [facility] [address] [place], [place], [address] Phone: [phone]   Fax:  [phone]  Name: Kimberly [name] MRN: [id number] Date of Birth: August 07, 1950

## 2016-04-03 ENCOUNTER — Ambulatory Visit: Payer: Medicare Other

## 2016-04-09 ENCOUNTER — Ambulatory Visit
Admission: RE | Admit: 2016-04-09 | Discharge: 2016-04-09 | Disposition: A | Discharge status: Home / Self Care | Payer: Medicare Other | Source: Ambulatory Visit | Attending: Family Medicine | Admitting: Family Medicine

## 2016-04-09 DIAGNOSIS — Z1231 Encounter for screening mammogram for malignant neoplasm of breast: Secondary | ICD-10-CM

## 2016-04-10 ENCOUNTER — Ambulatory Visit (INDEPENDENT_AMBULATORY_CARE_PROVIDER_SITE_OTHER): Payer: Medicare Other | Admitting: Family Medicine

## 2016-04-10 ENCOUNTER — Encounter: Payer: Self-pay | Admitting: Family Medicine

## 2016-04-10 VITALS — BP 120/82 | HR 73 | Temp 97.8°F | Ht 62.0 in | Wt 162.3 lb

## 2016-04-10 DIAGNOSIS — I10 Essential (primary) hypertension: Secondary | ICD-10-CM

## 2016-04-10 DIAGNOSIS — Z0000 Encounter for general adult medical examination without abnormal findings: Secondary | ICD-10-CM

## 2016-04-10 DIAGNOSIS — E039 Hypothyroidism, unspecified: Secondary | ICD-10-CM | POA: Diagnosis not present

## 2016-04-10 DIAGNOSIS — E8881 Metabolic syndrome: Secondary | ICD-10-CM

## 2016-04-10 MED ORDER — LEVOTHYROXINE SODIUM 50 MCG PO TABS: ORAL_TABLET | ORAL | 3 refills | Status: DC

## 2016-04-10 MED ORDER — IRBESARTAN 150 MG PO TABS: 150.0000 mg | ORAL_TABLET | Freq: Every day | ORAL | 3 refills | Status: DC

## 2016-04-10 NOTE — Progress Notes (Addendum)
Subjective:     Patient ID: Kimberly [name], female   DOB: May 13, 1951, 65 y.o.   MRN: [id number]  HPI Patient seen for physical exam and medical follow up. She has chronic problems including osteoarthritis, obesity, hypertension, hypothyroidism. She's had previous right total knee replacement. She's had ongoing problems with her left knee. Very minimal knee pain but has significant mechanical issues with her left lower extremity. She's had some recent left ankle pain which she thinks is related to knee difficulties. She had some recent lumbar back pain but MRI of the lumbar spine revealed no major abnormalities. She has made some positive dietary changes recently. She is exercising more. No chest pains. Immunizations are up-to-date. Colonoscopy up-to-date. Mammogram up-to-date.  She takes Avapro for hypertension.  BP stable.  No headaches.  No chest pains or dizziness. On Levothyroxine for hypothyroidism.  Compliant with all medications.  Past Medical History:  Diagnosis Date  . Anemia 2005   hb 11.5  . Arthritis   . DVT (deep venous thrombosis) ([place])    2003, coumadin x 6 mon  . GERD (gastroesophageal reflux disease) 2001   no problem now  . Hypertension   . Hypothyroidism    off meds  . Osteopenia   . Vegetarian diet    eats fish and eggs; no meat   Past Surgical History:  Procedure Laterality Date  . arthroscopy Right knee  03/16/03  . oophorectomy unilateral    . TOTAL KNEE ARTHROPLASTY Right 09/14/2014   Procedure: RIGHT TOTAL KNEE ARTHROPLASTY;  Surgeon: Garald Balding, MD;  Location: [facility];  Service: Orthopedics;  Laterality: Right;    reports that she has never smoked. She has never used smokeless tobacco. She reports that she does not drink alcohol or use drugs. family history includes Alcohol abuse in her other; Diabetes in her mother and other; Heart failure in her father. Allergies  Allergen Reactions  . Enoxaparin Sodium     REACTION: itching,swelling     Review of  Systems  Constitutional: Negative for activity change, appetite change, fatigue, fever and unexpected weight change.  HENT: Negative for ear pain, hearing loss, sore throat and trouble swallowing.   Eyes: Negative for visual disturbance.  Respiratory: Negative for cough and shortness of breath.   Cardiovascular: Negative for chest pain and palpitations.  Gastrointestinal: Negative for abdominal pain, blood in stool, constipation and diarrhea.  Endocrine: Negative for polydipsia and polyuria.  Genitourinary: Negative for dysuria and hematuria.  Musculoskeletal: Positive for arthralgias. Negative for back pain and myalgias.  Skin: Negative for rash.  Neurological: Negative for dizziness, syncope and headaches.  Hematological: Negative for adenopathy.  Psychiatric/Behavioral: Negative for confusion and dysphoric mood.       Objective:   Physical Exam  Constitutional: She is oriented to person, place, and time. She appears well-developed and well-nourished.  HENT:  Head: Normocephalic and atraumatic.  Eyes: EOM are normal. Pupils are equal, round, and reactive to light.  Neck: Normal range of motion. Neck supple. No thyromegaly present.  Cardiovascular: Normal rate, regular rhythm and normal heart sounds.   No murmur heard. Pulmonary/Chest: Breath sounds normal. No respiratory distress. She has no wheezes. She has no rales.  Abdominal: Soft. Bowel sounds are normal. She exhibits no distension and no mass. There is no tenderness. There is no rebound and no guarding.  Musculoskeletal: Normal range of motion. She exhibits no edema.  Lymphadenopathy:    She has no cervical adenopathy.  Neurological: She is alert and oriented to person,  place, and time. She displays normal reflexes. No cranial nerve deficit.  Skin: No rash noted.  Psychiatric: She has a normal mood and affect. Her behavior is normal. Judgment and thought content normal.       Assessment:     #1 Physical exam. Labs  reviewed with no major concern. A1c 5.9%. Lipids improved  #2 Hypertension- stable.  #3 Hypothyroidism.  #4 Osteoarthritis multiple joints.    Plan:     -Continue current medications -Continue weight loss efforts -Continue with yearly flu vaccine -she is getting mammograms every 2 years. - we discussed repeat PAP smear and she declines at this time.  Eulas Post MD [facility]

## 2016-04-10 NOTE — Progress Notes (Signed)
Pre visit review using our clinic review tool, if applicable. No additional management support is needed unless otherwise documented below in the visit note. 

## 2016-04-10 NOTE — Patient Instructions (Signed)
Continue with weight loss efforts.

## 2016-04-17 ENCOUNTER — Ambulatory Visit (INDEPENDENT_AMBULATORY_CARE_PROVIDER_SITE_OTHER): Payer: Medicare Other | Admitting: Orthopaedic Surgery

## 2016-04-17 VITALS — BP 134/64 | HR 81 | Resp 14 | Ht 61.0 in | Wt 165.0 lb

## 2016-04-17 DIAGNOSIS — M25572 Pain in left ankle and joints of left foot: Secondary | ICD-10-CM

## 2016-04-17 NOTE — Progress Notes (Signed)
Office Visit Note   Patient: Kimberly [name]           Date of Birth: [birth year]           MRN: [id number] Visit Date: 04/17/2016              Requested by: Eulas Post, MD [place], [place] [address] PCP: Eulas Post, MD   Assessment & Plan: Visit Diagnoses:  1. Pain in left ankle and joints of left foot   End-stage osteoarthritis left knee.  Left ankle pain with subcortical cystic lesions in the medial talar dome with overlying articular cartilage irregularity and chondral thinning associated with mild distal tibial posterior tendinopathy and minimal distal Achilles tendinopathy.  Plan: We will schedule left total knee replacement. Kimberly [name] has reached the point where she has significant compromise of her activities and now wishes to proceed. She would like to consider surgery sometime in either March or May when her husband has time off from work  Follow-Up Instructions: Return if symptoms worsen or fail to improve, for will schedule left TKR.   Orders:  No orders of the defined types were placed in this encounter.  No orders of the defined types were placed in this encounter.     Procedures: No procedures performed   Clinical Data: No additional findings.   Subjective: No chief complaint on file.   Pt had MRI of her Left ankle on 01/04/16. She says it hurts to put weight on that foot. She has a loose ace bandage on for "support".  Ankle is swollen and she has been in PT for 8 weeks. Still having pain.     Kimberly [name] has a chronic history of left knee pain consistent with end-stage osteoarthritis. She was scheduled for a total knee replacement early in the year but elected to postpone it. She has also developed a problem with her left ankle. She had an MRI scan in August which is intended to the chart. She has some early arthritis of her ankle associated with subchondral cystic changes in the medial talar dome. While visiting her  daughter in [place] she was seen by a foot and ankle specialist and was told that before anything that was done to her ankle she should consider knee replacement. She's been wrapping an Ace bandage around her ankle and it seems to help. She is going to [place] for a month and a half in the next several days and wanted to discuss her left lower extremity problems. It has reached the point where she is having compromise or of her activities.  Review of Systems   Objective: Vital Signs: BP 134/64   Pulse 81   Resp 14   Ht 5\' 1"  (1.549 m)   Wt 165 lb (74.8 kg)   LMP 05/14/1998   BMI 31.18 kg/m   Physical Exam  Ortho Exam left knee exam demonstrates lack of full extension. There was no effusion. Approximately 110 of flexion. Increased varus without any skin changes. There is some pain with dorsiflexion of the left ankle without crepitation. There does not appear to be any instability with varus and valgus stress. There was some tenderness along the Achilles tendon without mass formation. No particular pain along the medial lateral malleoli were just posterior to either structure:  No specialty comments available.  Imaging: No results found.   PMFS History: Patient Active Problem List   Diagnosis Date Noted  . Osteopenia 02/28/2015  . Left lumbar  radiculitis 02/28/2015  . Acute blood loss anemia 09/20/2014  . Primary osteoarthritis of right knee 09/14/2014  . Primary osteoarthritis of knee 09/14/2014  . Vitamin B 12 deficiency 07/29/2014  . Obesity (BMI 30-39.9) 12/08/2013  . Osteoarthritis, knee 05/30/2012  . Metabolic syndrome [id number]  . DVT, HX OF 02/10/2010  . Vitamin D deficiency 08/25/2007  . Disorder of bone and cartilage 04/08/2007  . Hypothyroidism 11/18/2006  . ANEMIA-NOS 11/18/2006  . Essential hypertension 11/18/2006  . GERD 11/18/2006   Past Medical History:  Diagnosis Date  . Anemia 2005   hb 11.5  . Arthritis   . DVT (deep venous thrombosis) ([place])     2003, coumadin x 6 mon  . GERD (gastroesophageal reflux disease) 2001   no problem now  . Hypertension   . Hypothyroidism    off meds  . Osteopenia   . Vegetarian diet    eats fish and eggs; no meat    Family History  Problem Relation Age of Onset  . Diabetes Mother   . Heart failure Father   . Diabetes Other   . Alcohol abuse Other     Past Surgical History:  Procedure Laterality Date  . arthroscopy Right knee  03/16/03  . oophorectomy unilateral    . TOTAL KNEE ARTHROPLASTY Right 09/14/2014   Procedure: RIGHT TOTAL KNEE ARTHROPLASTY;  Surgeon: Garald Balding, MD;  Location: [facility];  Service: Orthopedics;  Laterality: Right;   Social History   Occupational History  . Not on file.   Social History Main Topics  . Smoking status: Never Smoker  . Smokeless tobacco: Never Used  . Alcohol use No  . Drug use: No  . Sexual activity: Not on file

## 2016-04-17 NOTE — Progress Notes (Deleted)
Pt had MRI of her Left ankle on 01/04/16. She says it hurts to put weight on that foot. She has a loose ace bandage on for "support".  Ankle is swollen and she has been in PT for 8 weeks. Still having pain.  [id number]

## 2016-06-19 ENCOUNTER — Telehealth (INDEPENDENT_AMBULATORY_CARE_PROVIDER_SITE_OTHER): Payer: Self-pay | Admitting: Orthopaedic Surgery

## 2016-06-19 NOTE — Telephone Encounter (Signed)
Spoke with pt, now wanting to wait until possibly end of May or in June or August to have surgery completed. Pt will give me a call when she is ready to schedule surgery. Stated will call near end of April.

## 2016-07-06 ENCOUNTER — Ambulatory Visit (INDEPENDENT_AMBULATORY_CARE_PROVIDER_SITE_OTHER): Payer: Medicare Other | Admitting: Family Medicine

## 2016-07-06 VITALS — BP 110/70 | HR 77 | Ht 61.0 in | Wt 160.8 lb

## 2016-07-06 DIAGNOSIS — G8929 Other chronic pain: Secondary | ICD-10-CM

## 2016-07-06 DIAGNOSIS — M25572 Pain in left ankle and joints of left foot: Secondary | ICD-10-CM | POA: Diagnosis not present

## 2016-07-06 NOTE — Progress Notes (Signed)
Subjective:     Patient ID: Kimberly [name], female   DOB: [birth year], 66 y.o.   MRN: [id number]  HPI Patient's had some chronic left ankle pain. She has seen orthopedist and had MRI several months ago and has had previous steroid injection without much improvement. She has osteoarthritis of left knee and has not decided yet about getting surgery for that. She denies any recent injury. She has swelling which is almost daily and has improved in the past with compression. No warmth or erythema.  Past Medical History:  Diagnosis Date  . Anemia 2005   hb 11.5  . Arthritis   . DVT (deep venous thrombosis) ([place])    2003, coumadin x 6 mon  . GERD (gastroesophageal reflux disease) 2001   no problem now  . Hypertension   . Hypothyroidism    off meds  . Osteopenia   . Vegetarian diet    eats fish and eggs; no meat   Past Surgical History:  Procedure Laterality Date  . arthroscopy Right knee  03/16/03  . oophorectomy unilateral    . TOTAL KNEE ARTHROPLASTY Right 09/14/2014   Procedure: RIGHT TOTAL KNEE ARTHROPLASTY;  Surgeon: Garald Balding, MD;  Location: [facility];  Service: Orthopedics;  Laterality: Right;    reports that she has never smoked. She has never used smokeless tobacco. She reports that she does not drink alcohol or use drugs. family history includes Alcohol abuse in her other; Diabetes in her mother and other; Heart failure in her father. Allergies  Allergen Reactions  . Enoxaparin Sodium     REACTION: itching,swelling     Review of Systems  Constitutional: Negative for chills and fever.       Objective:   Physical Exam  Constitutional: She appears well-developed and well-nourished.  Cardiovascular: Normal rate and regular rhythm.   Pulmonary/Chest: Effort normal and breath sounds normal. No respiratory distress. She has no wheezes. She has no rales.  Musculoskeletal:  Left ankle reveals some mild edema but no warmth or erythema. No ecchymosis. Full range of motion.        Assessment:     Chronic left ankle edema with known degenerative arthritis    Plan:     -Ace wrap provided for additional support with 4" ACE wrap. -Continue follow-up with orthopedist regarding her knee issues  Eulas Post MD [facility]

## 2016-07-06 NOTE — Progress Notes (Signed)
Pre visit review using our clinic review tool, if applicable. No additional management support is needed unless otherwise documented below in the visit note. 

## 2016-08-01 ENCOUNTER — Encounter: Payer: Self-pay | Admitting: Family Medicine

## 2016-08-01 ENCOUNTER — Ambulatory Visit (INDEPENDENT_AMBULATORY_CARE_PROVIDER_SITE_OTHER): Payer: Medicare Other | Admitting: Family Medicine

## 2016-08-01 VITALS — BP 120/70 | HR 92 | Temp 98.5°F | Wt 167.2 lb

## 2016-08-01 DIAGNOSIS — B029 Zoster without complications: Secondary | ICD-10-CM

## 2016-08-01 MED ORDER — VALACYCLOVIR HCL 1 G PO TABS: 1000.0000 mg | ORAL_TABLET | Freq: Three times a day (TID) | ORAL | 0 refills | Status: DC

## 2016-08-01 NOTE — Patient Instructions (Signed)
Shingles Shingles, which is also known as herpes zoster, is an infection that causes a painful skin rash and fluid-filled blisters. Shingles is not related to genital herpes, which is a sexually transmitted infection. Shingles only develops in people who:  Have had chickenpox.  Have received the chickenpox vaccine. (This is rare.)  What are the causes? Shingles is caused by varicella-zoster virus (VZV). This is the same virus that causes chickenpox. After exposure to VZV, the virus stays in the body in an inactive (dormant) state. Shingles develops if the virus reactivates. This can happen many years after the initial exposure to VZV. It is not known what causes this virus to reactivate. What increases the risk? People who have had chickenpox or received the chickenpox vaccine are at risk for shingles. Infection is more common in people who:  Are older than age 50.  Have a weakened defense (immune) system, such as those with HIV, AIDS, or cancer.  Are taking medicines that weaken the immune system, such as transplant medicines.  Are under great stress.  What are the signs or symptoms? Early symptoms of this condition include itching, tingling, and pain in an area on your skin. Pain may be described as burning, stabbing, or throbbing. A few days or weeks after symptoms start, a painful red rash appears, usually on one side of the body in a bandlike or beltlike pattern. The rash eventually turns into fluid-filled blisters that break open, scab over, and dry up in about 2-3 weeks. At any time during the infection, you may also develop:  A fever.  Chills.  A headache.  An upset stomach.  How is this diagnosed? This condition is diagnosed with a skin exam. Sometimes, skin or fluid samples are taken from the blisters before a diagnosis is made. These samples are examined under a microscope or sent to a lab for testing. How is this treated? There is no specific cure for this condition.  Your health care provider will probably prescribe medicines to help you manage pain, recover more quickly, and avoid long-term problems. Medicines may include:  Antiviral drugs.  Anti-inflammatory drugs.  Pain medicines.  If the area involved is on your face, you may be referred to a specialist, such as an eye doctor (ophthalmologist) or an ear, nose, and throat (ENT) doctor to help you avoid eye problems, chronic pain, or disability. Follow these instructions at home: Medicines  Take medicines only as directed by your health care provider.  Apply an anti-itch or numbing cream to the affected area as directed by your health care provider. Blister and Rash Care  Take a cool bath or apply cool compresses to the area of the rash or blisters as directed by your health care provider. This may help with pain and itching.  Keep your rash covered with a loose bandage (dressing). Wear loose-fitting clothing to help ease the pain of material rubbing against the rash.  Keep your rash and blisters clean with mild soap and cool water or as directed by your health care provider.  Check your rash every day for signs of infection. These include redness, swelling, and pain that lasts or increases.  Do not pick your blisters.  Do not scratch your rash. General instructions  Rest as directed by your health care provider.  Keep all follow-up visits as directed by your health care provider. This is important.  Until your blisters scab over, your infection can cause chickenpox in people who have never had it or been vaccinated   against it. To prevent this from happening, avoid contact with other people, especially: ? Babies. ? Pregnant women. ? Children who have eczema. ? Elderly people who have transplants. ? People who have chronic illnesses, such as leukemia or AIDS. Contact a health care provider if:  Your pain is not relieved with prescribed medicines.  Your pain does not get better after  the rash heals.  Your rash looks infected. Signs of infection include redness, swelling, and pain that lasts or increases. Get help right away if:  The rash is on your face or nose.  You have facial pain, pain around your eye area, or loss of feeling on one side of your face.  You have ear pain or you have ringing in your ear.  You have loss of taste.  Your condition gets worse. This information is not intended to replace advice given to you by your health care provider. Make sure you discuss any questions you have with your health care provider. Document Released: 04/30/2005 Document Revised: 12/25/2015 Document Reviewed: 03/11/2014 Elsevier Interactive Patient Education  2017 [employer]  

## 2016-08-01 NOTE — Progress Notes (Signed)
Pre visit review using our clinic review tool, if applicable. No additional management support is needed unless otherwise documented below in the visit note. 

## 2016-08-01 NOTE — Progress Notes (Signed)
Subjective:     Patient ID: Kimberly [name], female   DOB: 07/21/1950, 66 y.o.   MRN: [id number]  HPI   Patient seen with rash right side of body and thought she may have shingles. She just got back from [place] last night. Her daughter and son-in-law are both physicians and they felt this was probably shingles but were not certain. She states she actually had a couple of small vesicles on March 10 that were slightly painful but she is actually getting new crops of vesicles even the past 48 hours. No definite fever but has some subjective fever yesterday. Her pain is minimal and 2 out of 10. Rash is confined right side of body. She had previous shingles vaccine several years ago. She does not have any known immune problems.  Past Medical History:  Diagnosis Date  . Anemia 2005   hb 11.5  . Arthritis   . DVT (deep venous thrombosis) ([place])    2003, coumadin x 6 mon  . GERD (gastroesophageal reflux disease) 2001   no problem now  . Hypertension   . Hypothyroidism    off meds  . Osteopenia   . Vegetarian diet    eats fish and eggs; no meat   Past Surgical History:  Procedure Laterality Date  . arthroscopy Right knee  03/16/03  . oophorectomy unilateral    . TOTAL KNEE ARTHROPLASTY Right 09/14/2014   Procedure: RIGHT TOTAL KNEE ARTHROPLASTY;  Surgeon: Garald Balding, MD;  Location: [facility];  Service: Orthopedics;  Laterality: Right;    reports that she has never smoked. She has never used smokeless tobacco. She reports that she does not drink alcohol or use drugs. family history includes Alcohol abuse in her other; Diabetes in her mother and other; Heart failure in her father. Allergies  Allergen Reactions  . Enoxaparin Sodium     REACTION: itching,swelling     Review of Systems  Constitutional: Negative for chills and fever.  Skin: Positive for rash.       Objective:   Physical Exam  Constitutional: She appears well-developed and well-nourished.  Cardiovascular: Normal rate and  regular rhythm.   Pulmonary/Chest: Effort normal and breath sounds normal. No respiratory distress. She has no wheezes. She has no rales.  Skin: Rash noted.  Patient has rash which follows a clear dermatomal distribution includes right upper back but mostly posterior aspect of right arm and axillary region. She has a cluster of vesicles which appear to be more old and crusted right posterior arm has some newer appearing cluster of vesicles which are not crusted in the right axillary region       Assessment:     Shingles involving right upper extremity and right upper thoracic region    Plan:     -We elected to go ahead and start Valtrex 1 g 3 times a day for 7 days given the fact she seems to be getting new clusters. She has minimal pain at this time -Keep clean with soap and water and follow-up for any signs of secondary infection  Eulas Post MD [facility] at [facility]

## 2016-08-07 ENCOUNTER — Encounter: Payer: Self-pay | Admitting: Family Medicine

## 2016-08-07 ENCOUNTER — Ambulatory Visit (INDEPENDENT_AMBULATORY_CARE_PROVIDER_SITE_OTHER): Payer: Medicare Other | Admitting: Family Medicine

## 2016-08-07 VITALS — BP 120/80 | HR 70 | Temp 97.6°F | Wt 163.6 lb

## 2016-08-07 DIAGNOSIS — B029 Zoster without complications: Secondary | ICD-10-CM

## 2016-08-07 NOTE — Progress Notes (Signed)
Pre visit review using our clinic review tool, if applicable. No additional management support is needed unless otherwise documented below in the visit note. 

## 2016-08-07 NOTE — Patient Instructions (Addendum)
Shingles Shingles, which is also known as herpes zoster, is an infection that causes a painful skin rash and fluid-filled blisters. Shingles is not related to genital herpes, which is a sexually transmitted infection. Shingles only develops in people who:  Have had chickenpox.  Have received the chickenpox vaccine. (This is rare.)  What are the causes? Shingles is caused by varicella-zoster virus (VZV). This is the same virus that causes chickenpox. After exposure to VZV, the virus stays in the body in an inactive (dormant) state. Shingles develops if the virus reactivates. This can happen many years after the initial exposure to VZV. It is not known what causes this virus to reactivate. What increases the risk? People who have had chickenpox or received the chickenpox vaccine are at risk for shingles. Infection is more common in people who:  Are older than age 50.  Have a weakened defense (immune) system, such as those with HIV, AIDS, or cancer.  Are taking medicines that weaken the immune system, such as transplant medicines.  Are under great stress.  What are the signs or symptoms? Early symptoms of this condition include itching, tingling, and pain in an area on your skin. Pain may be described as burning, stabbing, or throbbing. A few days or weeks after symptoms start, a painful red rash appears, usually on one side of the body in a bandlike or beltlike pattern. The rash eventually turns into fluid-filled blisters that break open, scab over, and dry up in about 2-3 weeks. At any time during the infection, you may also develop:  A fever.  Chills.  A headache.  An upset stomach.  How is this diagnosed? This condition is diagnosed with a skin exam. Sometimes, skin or fluid samples are taken from the blisters before a diagnosis is made. These samples are examined under a microscope or sent to a lab for testing. How is this treated? There is no specific cure for this condition.  Your health care provider will probably prescribe medicines to help you manage pain, recover more quickly, and avoid long-term problems. Medicines may include:  Antiviral drugs.  Anti-inflammatory drugs.  Pain medicines.  If the area involved is on your face, you may be referred to a specialist, such as an eye doctor (ophthalmologist) or an ear, nose, and throat (ENT) doctor to help you avoid eye problems, chronic pain, or disability. Follow these instructions at home: Medicines  Take medicines only as directed by your health care provider.  Apply an anti-itch or numbing cream to the affected area as directed by your health care provider. Blister and Rash Care  Take a cool bath or apply cool compresses to the area of the rash or blisters as directed by your health care provider. This may help with pain and itching.  Keep your rash covered with a loose bandage (dressing). Wear loose-fitting clothing to help ease the pain of material rubbing against the rash.  Keep your rash and blisters clean with mild soap and cool water or as directed by your health care provider.  Check your rash every day for signs of infection. These include redness, swelling, and pain that lasts or increases.  Do not pick your blisters.  Do not scratch your rash. General instructions  Rest as directed by your health care provider.  Keep all follow-up visits as directed by your health care provider. This is important.  Until your blisters scab over, your infection can cause chickenpox in people who have never had it or been vaccinated   against it. To prevent this from happening, avoid contact with other people, especially: ? Babies. ? Pregnant women. ? Children who have eczema. ? Elderly people who have transplants. ? People who have chronic illnesses, such as leukemia or AIDS. Contact a health care provider if:  Your pain is not relieved with prescribed medicines.  Your pain does not get better after  the rash heals.  Your rash looks infected. Signs of infection include redness, swelling, and pain that lasts or increases. Get help right away if:  The rash is on your face or nose.  You have facial pain, pain around your eye area, or loss of feeling on one side of your face.  You have ear pain or you have ringing in your ear.  You have loss of taste.  Your condition gets worse. This information is not intended to replace advice given to you by your health care provider. Make sure you discuss any questions you have with your health care provider. Document Released: 04/30/2005 Document Revised: 12/25/2015 Document Reviewed: 03/11/2014 Elsevier Interactive Patient Education  2017 [employer]vier Inc.  

## 2016-08-07 NOTE — Progress Notes (Signed)
Subjective:     Patient ID: Kimberly [name], female   DOB: 1950-07-23, 66 y.o.   MRN: [id number]  HPI Patient is here for follow-up regarding her recent shingles involving right thoracic area and right arm. Her pain is minimal and is 3-4 out of 10 at its worst and varies  in intensity. She was treated with Valtrex last week. She's had some nonspecific fatigue which she thinks may be related to a "cold ". She's had some mild nasal congestion and minimal cough. No fever. No generalized myalgias.  Past Medical History:  Diagnosis Date  . Anemia 2005   hb 11.5  . Arthritis   . DVT (deep venous thrombosis) ([place])    2003, coumadin x 6 mon  . GERD (gastroesophageal reflux disease) 2001   no problem now  . Hypertension   . Hypothyroidism    off meds  . Osteopenia   . Vegetarian diet    eats fish and eggs; no meat   Past Surgical History:  Procedure Laterality Date  . arthroscopy Right knee  03/16/03  . oophorectomy unilateral    . TOTAL KNEE ARTHROPLASTY Right 09/14/2014   Procedure: RIGHT TOTAL KNEE ARTHROPLASTY;  Surgeon: Garald Balding, MD;  Location: [facility];  Service: Orthopedics;  Laterality: Right;    reports that she has never smoked. She has never used smokeless tobacco. She reports that she does not drink alcohol or use drugs. family history includes Alcohol abuse in her other; Diabetes in her mother and other; Heart failure in her father. Allergies  Allergen Reactions  . Enoxaparin Sodium     REACTION: itching,swelling     Review of Systems  Constitutional: Positive for fatigue.  HENT: Positive for congestion.   Respiratory: Positive for cough.   Gastrointestinal: Negative for abdominal pain.  Genitourinary: Negative for dysuria.  Skin: Positive for rash.  Neurological: Negative for headaches.       Objective:   Physical Exam  Constitutional: She appears well-developed and well-nourished.  HENT:  Right Ear: External ear normal.  Left Ear: External ear normal.   Mouth/Throat: Oropharynx is clear and moist.  Cardiovascular: Normal rate and regular rhythm.   Pulmonary/Chest: Effort normal and breath sounds normal. No respiratory distress. She has no wheezes. She has no rales.  Skin: Rash noted.  Patient has vesicular rash posterior aspect right arm and also mid thoracic area which is drying up very well       Assessment:     Shingles involving right thoracic distribution. This appears to be drying up and she has minimal pain    Plan:     -Continue Tylenol as needed for discomfort -Follow-up when necessary  Eulas Post MD [facility] at [facility]

## 2016-08-24 ENCOUNTER — Telehealth (INDEPENDENT_AMBULATORY_CARE_PROVIDER_SITE_OTHER): Payer: Self-pay | Admitting: Orthopaedic Surgery

## 2016-08-27 NOTE — Telephone Encounter (Signed)
Surgery sheet completed

## 2016-08-27 NOTE — Telephone Encounter (Signed)
Please advise 

## 2016-08-27 NOTE — Telephone Encounter (Signed)
Faxed to [facility]

## 2016-09-20 ENCOUNTER — Telehealth (INDEPENDENT_AMBULATORY_CARE_PROVIDER_SITE_OTHER): Payer: Self-pay | Admitting: Orthopaedic Surgery

## 2016-09-20 NOTE — Telephone Encounter (Signed)
Patient has dental appointment on 09/27/16 and is requesting antibiotic be sent to [facility].

## 2016-09-21 ENCOUNTER — Other Ambulatory Visit (INDEPENDENT_AMBULATORY_CARE_PROVIDER_SITE_OTHER): Payer: Self-pay

## 2016-09-21 MED ORDER — CEPHALEXIN 500 MG PO CAPS: 500.0000 mg | ORAL_CAPSULE | Freq: Once | ORAL | 0 refills | Status: DC | PRN

## 2016-09-21 NOTE — Telephone Encounter (Signed)
done

## 2016-09-27 ENCOUNTER — Telehealth (INDEPENDENT_AMBULATORY_CARE_PROVIDER_SITE_OTHER): Payer: Self-pay | Admitting: Orthopaedic Surgery

## 2016-09-27 NOTE — Telephone Encounter (Signed)
Husband left a message in reference to patients rx that was sent into [facility]. Per husband Rx was not signed, and they could not fill Rx. Patient needs rx for this am appt with dentist. Please send a signed Rx to pharmacy.

## 2016-09-27 NOTE — Telephone Encounter (Signed)
Checked the chart and I sent it e-scribed on 09/21/16. I LVMOM with the same rx for patient at 8:25am, pharmacy does not open unil 9am.

## 2016-09-27 NOTE — Telephone Encounter (Signed)
LVMOM for pt

## 2016-10-02 ENCOUNTER — Other Ambulatory Visit (INDEPENDENT_AMBULATORY_CARE_PROVIDER_SITE_OTHER): Payer: Self-pay | Admitting: Orthopaedic Surgery

## 2016-11-06 ENCOUNTER — Ambulatory Visit (INDEPENDENT_AMBULATORY_CARE_PROVIDER_SITE_OTHER): Payer: Medicare Other | Admitting: Family Medicine

## 2016-11-06 ENCOUNTER — Encounter: Payer: Self-pay | Admitting: *Deleted

## 2016-11-06 VITALS — BP 110/74 | HR 73 | Temp 97.9°F | Wt 169.6 lb

## 2016-11-06 DIAGNOSIS — R739 Hyperglycemia, unspecified: Secondary | ICD-10-CM

## 2016-11-06 DIAGNOSIS — R202 Paresthesia of skin: Secondary | ICD-10-CM | POA: Diagnosis not present

## 2016-11-06 DIAGNOSIS — G629 Polyneuropathy, unspecified: Secondary | ICD-10-CM

## 2016-11-06 DIAGNOSIS — E8881 Metabolic syndrome: Secondary | ICD-10-CM

## 2016-11-06 LAB — HEMOGLOBIN A1C: Hgb A1c MFr Bld: 6.2 % (ref 4.6–6.5)

## 2016-11-06 LAB — VITAMIN B12: VITAMIN B 12: 646 pg/mL (ref 211–911)

## 2016-11-06 MED ORDER — GABAPENTIN 100 MG PO CAPS: 100.0000 mg | ORAL_CAPSULE | Freq: Three times a day (TID) | ORAL | 3 refills | Status: DC

## 2016-11-06 NOTE — Progress Notes (Signed)
Subjective:     Patient ID: Kimberly [name], female   DOB: 19-Oct-1950, 66 y.o.   MRN: [id number]  HPI   Patient seen with several month history of progressive burning pain involving her left foot. This involves much of the dorsum of left foot but spares the great toe. She's not had any associated weakness. Her symptoms occur at rest. Starting to awaken more at night. Moderate severity. No clear triggering factors. No alleviating factors. She's tried things like Advil and Tylenol without relief. No foot edema. No recent change of shoe wear.  She has hypothyroidism and had normal TSH back in November. No history of alcohol use. History of mild hyperglycemia. Last A1c 5.9%. No recent polyuria or polydipsia. No history of recent B12 levels  She is getting ready to undergo left total knee replacement July 10  Past Medical History:  Diagnosis Date  . Anemia 2005   hb 11.5  . Arthritis   . DVT (deep venous thrombosis) ([place])    2003, coumadin x 6 mon  . GERD (gastroesophageal reflux disease) 2001   no problem now  . Hypertension   . Hypothyroidism    off meds  . Osteopenia   . Vegetarian diet    eats fish and eggs; no meat   Past Surgical History:  Procedure Laterality Date  . arthroscopy Right knee  03/16/03  . oophorectomy unilateral    . TOTAL KNEE ARTHROPLASTY Right 09/14/2014   Procedure: RIGHT TOTAL KNEE ARTHROPLASTY;  Surgeon: Garald Balding, MD;  Location: [facility];  Service: Orthopedics;  Laterality: Right;    reports that she has never smoked. She has never used smokeless tobacco. She reports that she does not drink alcohol or use drugs. family history includes Alcohol abuse in her other; Diabetes in her mother and other; Heart failure in her father. Allergies  Allergen Reactions  . Enoxaparin Sodium Itching and Swelling     Review of Systems  Constitutional: Negative for chills and fever.  Respiratory: Negative for shortness of breath.   Cardiovascular: Negative for chest  pain.  Skin: Negative for rash.  Neurological: Negative for weakness.  Hematological: Negative for adenopathy.       Objective:   Physical Exam  Constitutional: She appears well-developed and well-nourished.  Cardiovascular: Normal rate and regular rhythm.   Feet are warm to touch with excellent distal pulses  Pulmonary/Chest: Effort normal and breath sounds normal. No respiratory distress. She has no wheezes. She has no rales.  Musculoskeletal: She exhibits no edema.  Neurological:  Patient has full strength lower extremities. Symmetric reflexes. Normal sensory function to touch  Skin: No rash noted.       Assessment:     Patient presents with sensory neuropathy symptoms of burning dysesthesias confined to the left foot. She has history of mild hyperglycemia. She has hypothyroidism which has been replaced.  ?idiopathic    Plan:     -Check B12 level and repeat hemoglobin A1C. -If the above is normal consider trial of low-dose gabapentin -We also discussed possible neurology referral but likely try the gabapentin first  Eulas Post MD [facility] at [facility]

## 2016-11-06 NOTE — Patient Instructions (Addendum)
Neuropathic Pain Neuropathic pain is pain caused by damage to the nerves that are responsible for certain sensations in your body (sensory nerves). The pain can be caused by damage to:  The sensory nerves that send signals to your spinal cord and brain (peripheral nervous system).  The sensory nerves in your brain or spinal cord (central nervous system).  Neuropathic pain can make you more sensitive to pain. What would be a minor sensation for most people may feel very painful if you have neuropathic pain. This is usually a long-term condition that can be difficult to treat. The type of pain can differ from person to person. It may start suddenly (acute), or it may develop slowly and last for a long time (chronic). Neuropathic pain may come and go as damaged nerves heal or may stay at the same level for years. It often causes emotional distress, loss of sleep, and a lower quality of life. What are the causes? The most common cause of damage to a sensory nerve is diabetes. Many other diseases and conditions can also cause neuropathic pain. Causes of neuropathic pain can be classified as:  Toxic. Many drugs and chemicals can cause toxic damage. The most common cause of toxic neuropathic pain is damage from drug treatment for cancer (chemotherapy).  Metabolic. This type of pain can happen when a disease causes imbalances that damage nerves. Diabetes is the most common of these diseases. Vitamin B deficiency caused by long-term alcohol abuse is another common cause.  Traumatic. Any injury that cuts, crushes, or stretches a nerve can cause damage and pain. A common example is feeling pain after losing an arm or leg (phantom limb pain).  Compression-related. If a sensory nerve gets trapped or compressed for a long period of time, the blood supply to the nerve can be cut off.  Vascular. Many blood vessel diseases can cause neuropathic pain by decreasing blood supply and oxygen to nerves.  Autoimmune.  This type of pain results from diseases in which the body's defense system mistakenly attacks sensory nerves. Examples of autoimmune diseases that can cause neuropathic pain include lupus and multiple sclerosis.  Infectious. Many types of viral infections can damage sensory nerves and cause pain. Shingles infection is a common cause of this type of pain.  Inherited. Neuropathic pain can be a symptom of many diseases that are passed down through families (genetic).  What are the signs or symptoms? The main symptom is pain. Neuropathic pain is often described as:  Burning.  Shock-like.  Stinging.  Hot or cold.  Itching.  How is this diagnosed? No single test can diagnose neuropathic pain. Your health care provider will do a physical exam and ask you about your pain. You may use a pain scale to describe how bad your pain is. You may also have tests to see if you have a high sensitivity to pain and to help find the cause and location of any sensory nerve damage. These tests may include:  Imaging studies, such as: ? X-rays. ? CT scan. ? MRI.  Nerve conduction studies to test how well nerve signals travel through your sensory nerves (electrodiagnostic testing).  Stimulating your sensory nerves through electrodes on your skin and measuring the response in your spinal cord and brain (somatosensory evoked potentials).  How is this treated? Treatment for neuropathic pain may change over time. You may need to try different treatment options or a combination of treatments. Some options include:  Over-the-counter pain relievers.  Prescription medicines. Some medicines   used to treat other conditions may also help neuropathic pain. These include medicines to: ? Control seizures (anticonvulsants). ? Relieve depression (antidepressants).  Prescription-strength pain relievers (narcotics). These are usually used when other pain relievers do not help.  Transcutaneous nerve stimulation (TENS).  This uses electrical currents to block painful nerve signals. The treatment is painless.  Topical and local anesthetics. These are medicines that numb the nerves. They can be injected as a nerve block or applied to the skin.  Alternative treatments, such as: ? Acupuncture. ? Meditation. ? Massage. ? Physical therapy. ? Pain management programs. ? Counseling.  Follow these instructions at home:  Learn as much as you can about your condition.  Take medicines only as directed by your health care provider.  Work closely with all your health care providers to find what works best for you.  Have a good support system at home.  Consider joining a chronic pain support group. Contact a health care provider if:  Your pain treatments are not helping.  You are having side effects from your medicines.  You are struggling with fatigue, mood changes, depression, or anxiety. This information is not intended to replace advice given to you by your health care provider. Make sure you discuss any questions you have with your health care provider. Document Released: 01/26/2004 Document Revised: 11/18/2015 Document Reviewed: 10/08/2013 Elsevier Interactive Patient Education  2018 [employer].  IF labs normal, go ahead and start the Gabapentin at night and may add daytime dose after a few days if needed.

## 2016-11-07 ENCOUNTER — Ambulatory Visit (INDEPENDENT_AMBULATORY_CARE_PROVIDER_SITE_OTHER): Payer: Medicare Other | Admitting: Orthopedic Surgery

## 2016-11-07 ENCOUNTER — Encounter (INDEPENDENT_AMBULATORY_CARE_PROVIDER_SITE_OTHER): Payer: Self-pay | Admitting: Orthopedic Surgery

## 2016-11-07 VITALS — BP 124/74 | HR 74 | Temp 98.4°F | Resp 14 | Ht 61.0 in | Wt 169.0 lb

## 2016-11-07 DIAGNOSIS — M1712 Unilateral primary osteoarthritis, left knee: Secondary | ICD-10-CM | POA: Diagnosis not present

## 2016-11-07 NOTE — Progress Notes (Signed)
Kimberly Fears, MD   Biagio Borg, PA-C [address], [place], [place]  [address]                             [phone]   ORTHOPAEDIC HISTORY & PHYSICAL  Kimberly [name] MRN:  [id number] DOB/SEX:  12/17/1950/female  CHIEF COMPLAINT:  Painful left Knee  HISTORY: Patient is a 66 y.o. female presented with a history of pain in the left knee for 5 years. Onset of symptoms was gradual starting 5 years ago with gradually worsening course since that time. Prior procedures on the knee are NONE. Patient has been treated conservatively with over-the-counter NSAIDs and activity modification. Patient currently rates pain in the knee at 8 out of 10 with activity. There is pain at night. present.  They have been previously treated with: NSAIDS: Ibuprofen, Naprosyn, Steriods, Snyvisc with mild improvement  Knee injection with corticosteroid  was performed Knee injection with visco supplementation was performed Medications: muscle relaxant, Naprosyn, NSAID, Steriods with mild improvement  PAST MEDICAL HISTORY: Patient Active Problem List   Diagnosis Date Noted  . Osteopenia 02/28/2015  . Left lumbar radiculitis 02/28/2015  . Acute blood loss anemia 09/20/2014  . Primary osteoarthritis of right knee 09/14/2014  . Unilateral primary osteoarthritis, left knee 09/14/2014  . Vitamin B 12 deficiency 07/29/2014  . Obesity (BMI 30-39.9) 12/08/2013  . Osteoarthritis, knee 05/30/2012  . Metabolic syndrome 87/86/7672  . DVT, HX OF 02/10/2010  . Vitamin D deficiency 08/25/2007  . Disorder of bone and cartilage 04/08/2007  . Hypothyroidism 11/18/2006  . ANEMIA-NOS 11/18/2006  . Essential hypertension 11/18/2006  . GERD 11/18/2006   Past Medical History:  Diagnosis Date  . Anemia 2005   hb 11.5  . Arthritis   . DVT (deep venous thrombosis) ([facility])    2003, coumadin x 6 mon  . GERD (gastroesophageal reflux disease) 2001   no problem now  . Hypertension   . Hypothyroidism    off meds  .  Osteopenia   . Vegetarian diet    eats fish and eggs; no meat   Past Surgical History:  Procedure Laterality Date  . arthroscopy Right knee  03/16/03  . oophorectomy unilateral    . TOTAL KNEE ARTHROPLASTY Right 09/14/2014   Procedure: RIGHT TOTAL KNEE ARTHROPLASTY;  Surgeon: Garald Balding, MD;  Location: [facility];  Service: Orthopedics;  Laterality: Right;     MEDICATIONS PRIOR TO ADMISSION:  Current Outpatient Prescriptions:  .  acetaminophen (TYLENOL) 500 MG tablet, Take 1,000 mg by mouth daily as needed for moderate pain., Disp: , Rfl:  .  b complex vitamins tablet, Take 1 tablet by mouth 4 (four) times a week., Disp: , Rfl:  .  Calcium Carbonate (CALCIUM 600 PO), Take 600 mg by mouth daily., Disp: , Rfl:  .  cholecalciferol (VITAMIN D) 1000 units tablet, Take 1,000 Units by mouth daily., Disp: , Rfl:  .  gabapentin (NEURONTIN) 100 MG capsule, Take 1 capsule (100 mg total) by mouth 3 (three) times daily., Disp: 90 capsule, Rfl: 3 .  ibuprofen (ADVIL,MOTRIN) 200 MG tablet, Take 400 mg by mouth daily as needed for moderate pain., Disp: , Rfl:  .  irbesartan (AVAPRO) 150 MG tablet, Take 1 tablet (150 mg total) by mouth daily., Disp: 90 tablet, Rfl: 3 .  levothyroxine (SYNTHROID, LEVOTHROID) 50 MCG tablet, TAKE 1 TABLET ONE TIME DAILY (Patient taking differently: Take 50 mcg by mouth daily before breakfast. ),  Disp: 90 tablet, Rfl: 3 .  TURMERIC PO, Take 1 capsule by mouth daily., Disp: , Rfl:  .  valACYclovir (VALTREX) 1000 MG tablet, Take 1 tablet (1,000 mg total) by mouth 3 (three) times daily., Disp: 21 tablet, Rfl: 0 .  cephALEXin (KEFLEX) 500 MG capsule, Take 500 mg by mouth as needed. Prn  Dental work only, Disp: , Rfl:    ALLERGIES:   Allergies  Allergen Reactions  . Enoxaparin Sodium Itching and Swelling    REVIEW OF SYSTEMS:  Review of Systems  Cardiovascular:       H/O HYPERTENSION  Endo/Heme/Allergies:       HYPOTHYROID   All other systems reviewed and are  negative.   FAMILY HISTORY:   Family History  Problem Relation Age of Onset  . Diabetes Mother   . Heart failure Father   . Diabetes Other   . Alcohol abuse Other     SOCIAL HISTORY:   Social History   Occupational History  . Not on file.   Social History Main Topics  . Smoking status: Never Smoker  . Smokeless tobacco: Never Used  . Alcohol use No  . Drug use: No  . Sexual activity: Not on file     EXAMINATION:  Vital signs in last 24 hours: BP 124/74   Pulse 74   Temp 98.4 F (36.9 C)   Resp 14   Ht 5\' 1"  (1.549 m)   Wt 169 lb (76.7 kg)   LMP 05/14/1998   BMI 31.93 kg/m   Physical Exam  Constitutional: She is oriented to person, place, and time. She appears well-developed and well-nourished.  HENT:  Head: Normocephalic and atraumatic.  Eyes: EOM are normal. Pupils are equal, round, and reactive to light.  Neck: Neck supple.  Cardiovascular: Normal rate, regular rhythm and intact distal pulses.  Exam reveals no friction rub (NO BRUITS).   Pulmonary/Chest: Effort normal and breath sounds normal.  Abdominal: Soft. Bowel sounds are normal. There is no tenderness.  Musculoskeletal:       Right knee: She exhibits effusion (mild).  Neurological: She is alert and oriented to person, place, and time.  Skin: Skin is warm and dry.  Psychiatric: She has a normal mood and affect. Her behavior is normal. Judgment and thought content normal.   Right Knee Exam   Tenderness  The patient is experiencing tenderness in the medial joint line.  Range of Motion  Right knee extension: 3.  Right knee flexion: 105.   Other  Sensation: normal Pulse: present Other tests: effusion (mild) present      Imaging Review Plain radiographs demonstrate moderate degenerative joint disease of the left knee. The overall alignment is mild varus. The bone quality appears to be good for age and reported activity level.  ASSESSMENT: End stage arthritis, left knee  Past Medical  History:  Diagnosis Date  . Anemia 2005   hb 11.5  . Arthritis   . DVT (deep venous thrombosis) ([facility])    2003, coumadin x 6 mon  . GERD (gastroesophageal reflux disease) 2001   no problem now  . Hypertension   . Hypothyroidism    off meds  . Osteopenia   . Vegetarian diet    eats fish and eggs; no meat    PLAN: Plan for left total knee replacement.  The patient history, physical examination and imaging studies are consistent with moderate degenerative joint disease of the left knee. The patient has failed conservative treatment.  The clearance notes  were reviewed.  After discussion with the patient it was felt that Total Knee Replacement was indicated. The procedure,  risks, and benefits of total knee arthroplasty were presented and reviewed. The risks including but not limited to aseptic loosening, infection, blood clots, vascular and nerve injury, stiffness, patella tracking problems and fracture complications among others were discussed. The patient acknowledged the explanation, agreed to proceed with total knee replacement.  Mike Craze [place], [place] [phone]  11/07/2016 1:13 PM

## 2016-11-07 NOTE — H&P (Signed)
Kimberly Fears, MD   Biagio Borg, PA-C [address] [place], [place]  [address]                             [phone]   ORTHOPAEDIC HISTORY & PHYSICAL  Kimberly [name] MRN:  [id number] DOB/SEX:  05/21/1950/female  CHIEF COMPLAINT:  Painful left Knee  HISTORY: Patient is a 66 y.o. female presented with a history of pain in the left knee for 5 years. Onset of symptoms was gradual starting 5 years ago with gradually worsening course since that time. Prior procedures on the knee are NONE. Patient has been treated conservatively with over-the-counter NSAIDs and activity modification. Patient currently rates pain in the knee at 8 out of 10 with activity. There is pain at night. present.  They have been previously treated with: NSAIDS: Ibuprofen, Naprosyn, Steriods, Snyvisc with mild improvement  Knee injection with corticosteroid  was performed Knee injection with visco supplementation was performed Medications: muscle relaxant, Naprosyn, NSAID, Steriods with mild improvement  PAST MEDICAL HISTORY: Patient Active Problem List   Diagnosis Date Noted  . Osteopenia 02/28/2015  . Left lumbar radiculitis 02/28/2015  . Acute blood loss anemia 09/20/2014  . Primary osteoarthritis of right knee 09/14/2014  . Unilateral primary osteoarthritis, left knee 09/14/2014  . Vitamin B 12 deficiency 07/29/2014  . Obesity (BMI 30-39.9) 12/08/2013  . Osteoarthritis, knee 05/30/2012  . Metabolic syndrome 02/72/5366  . DVT, HX OF 02/10/2010  . Vitamin D deficiency 08/25/2007  . Disorder of bone and cartilage 04/08/2007  . Hypothyroidism 11/18/2006  . ANEMIA-NOS 11/18/2006  . Essential hypertension 11/18/2006  . GERD 11/18/2006   Past Medical History:  Diagnosis Date  . Anemia 2005   hb 11.5  . Arthritis   . DVT (deep venous thrombosis) ([facility])    2003, coumadin x 6 mon  . GERD (gastroesophageal reflux disease) 2001   no problem now  . Hypertension   . Hypothyroidism    off meds  .  Osteopenia   . Vegetarian diet    eats fish and eggs; no meat   Past Surgical History:  Procedure Laterality Date  . arthroscopy Right knee  03/16/03  . oophorectomy unilateral    . TOTAL KNEE ARTHROPLASTY Right 09/14/2014   Procedure: RIGHT TOTAL KNEE ARTHROPLASTY;  Surgeon: Garald Balding, MD;  Location: [facility];  Service: Orthopedics;  Laterality: Right;     MEDICATIONS PRIOR TO ADMISSION:  Current Outpatient Prescriptions:  .  acetaminophen (TYLENOL) 500 MG tablet, Take 1,000 mg by mouth daily as needed for moderate pain., Disp: , Rfl:  .  b complex vitamins tablet, Take 1 tablet by mouth 4 (four) times a week., Disp: , Rfl:  .  Calcium Carbonate (CALCIUM 600 PO), Take 600 mg by mouth daily., Disp: , Rfl:  .  cholecalciferol (VITAMIN D) 1000 units tablet, Take 1,000 Units by mouth daily., Disp: , Rfl:  .  gabapentin (NEURONTIN) 100 MG capsule, Take 1 capsule (100 mg total) by mouth 3 (three) times daily., Disp: 90 capsule, Rfl: 3 .  ibuprofen (ADVIL,MOTRIN) 200 MG tablet, Take 400 mg by mouth daily as needed for moderate pain., Disp: , Rfl:  .  irbesartan (AVAPRO) 150 MG tablet, Take 1 tablet (150 mg total) by mouth daily., Disp: 90 tablet, Rfl: 3 .  levothyroxine (SYNTHROID, LEVOTHROID) 50 MCG tablet, TAKE 1 TABLET ONE TIME DAILY (Patient taking differently: Take 50 mcg by mouth daily before breakfast. ),  Disp: 90 tablet, Rfl: 3 .  TURMERIC PO, Take 1 capsule by mouth daily., Disp: , Rfl:  .  valACYclovir (VALTREX) 1000 MG tablet, Take 1 tablet (1,000 mg total) by mouth 3 (three) times daily., Disp: 21 tablet, Rfl: 0 .  cephALEXin (KEFLEX) 500 MG capsule, Take 500 mg by mouth as needed. Prn  Dental work only, Disp: , Rfl:    ALLERGIES:   Allergies  Allergen Reactions  . Enoxaparin Sodium Itching and Swelling    REVIEW OF SYSTEMS:  Review of Systems  Cardiovascular:       H/O HYPERTENSION  Endo/Heme/Allergies:       HYPOTHYROID   All other systems reviewed and are  negative.   FAMILY HISTORY:   Family History  Problem Relation Age of Onset  . Diabetes Mother   . Heart failure Father   . Diabetes Other   . Alcohol abuse Other     SOCIAL HISTORY:   Social History   Occupational History  . Not on file.   Social History Main Topics  . Smoking status: Never Smoker  . Smokeless tobacco: Never Used  . Alcohol use No  . Drug use: No  . Sexual activity: Not on file     EXAMINATION:  Vital signs in last 24 hours: BP 124/74   Pulse 74   Temp 98.4 F (36.9 C)   Resp 14   Ht 5\' 1"  (1.549 m)   Wt 169 lb (76.7 kg)   LMP 05/14/1998   BMI 31.93 kg/m   Physical Exam  Constitutional: She is oriented to person, place, and time. She appears well-developed and well-nourished.  HENT:  Head: Normocephalic and atraumatic.  Eyes: EOM are normal. Pupils are equal, round, and reactive to light.  Neck: Neck supple.  Cardiovascular: Normal rate, regular rhythm and intact distal pulses.  Exam reveals no friction rub (NO BRUITS).   Pulmonary/Chest: Effort normal and breath sounds normal.  Abdominal: Soft. Bowel sounds are normal. There is no tenderness.  Musculoskeletal:       Right knee: She exhibits effusion (mild).  Neurological: She is alert and oriented to person, place, and time.  Skin: Skin is warm and dry.  Psychiatric: She has a normal mood and affect. Her behavior is normal. Judgment and thought content normal.   Right Knee Exam   Tenderness  The patient is experiencing tenderness in the medial joint line.  Range of Motion  Right knee extension: 3.  Right knee flexion: 105.   Other  Sensation: normal Pulse: present Other tests: effusion (mild) present      Imaging Review Plain radiographs demonstrate moderate degenerative joint disease of the left knee. The overall alignment is mild varus. The bone quality appears to be good for age and reported activity level.  ASSESSMENT: End stage arthritis, left knee  Past Medical  History:  Diagnosis Date  . Anemia 2005   hb 11.5  . Arthritis   . DVT (deep venous thrombosis) ([facility])    2003, coumadin x 6 mon  . GERD (gastroesophageal reflux disease) 2001   no problem now  . Hypertension   . Hypothyroidism    off meds  . Osteopenia   . Vegetarian diet    eats fish and eggs; no meat    PLAN: Plan for left total knee replacement.  The patient history, physical examination and imaging studies are consistent with moderate degenerative joint disease of the left knee. The patient has failed conservative treatment.  The clearance notes  were reviewed.  After discussion with the patient it was felt that Total Knee Replacement was indicated. The procedure,  risks, and benefits of total knee arthroplasty were presented and reviewed. The risks including but not limited to aseptic loosening, infection, blood clots, vascular and nerve injury, stiffness, patella tracking problems and fracture complications among others were discussed. The patient acknowledged the explanation, agreed to proceed with total knee replacement.  Mike Craze [place], [place] [phone]  11/07/2016 1:13 PM

## 2016-11-07 NOTE — Pre-Procedure Instructions (Signed)
Kimberly [name]  11/07/2016      [id number] - [place], [place]. [place] [address] Phone: [phone] Fax: [facility] [address] [place] [address] Phone: [phone] Fax: [facility] [address], [place] - [address] [place] [place] [address] Phone: [phone] Fax: [facility] [place], [place] [address] Phone: [phone] Fax: [phone]    Your procedure is scheduled on July 10  Report to [facility] at [facility].  Call this number if you have problems the morning of surgery:  [phone]   Remember:  Do not eat food or drink liquids after midnight.   Take these medicines the morning of surgery with A SIP OF WATER gabapentin (NEURONTIN), levothyroxine (SYNTHROID, LEVOTHROID),  valACYclovir (VALTREX)  7 days prior to surgery STOP taking any Aspirin, Aleve, Naproxen, Ibuprofen, Motrin, Advil, Goody's, BC's, all herbal medications, fish oil, and all vitamins    Do not wear jewelry, make-up or nail polish.  Do not wear lotions, powders, or perfumes, or deoderant.  Do not shave 48 hours prior to surgery.    Do not bring valuables to the hospital.  [facility] is not responsible for any belongings or valuables.  Contacts, dentures or bridgework may not be worn into surgery.  Leave your suitcase in the car.  After surgery it may be brought to your room.  For patients admitted to the hospital, discharge time will be determined by your treatment team.  Patients discharged the day of surgery will not be allowed to drive home.    Special instructions:   [facility]- Preparing For Surgery  Before surgery, you can play an important role. Because skin is  not sterile, your skin needs to be as free of germs as possible. You can reduce the number of germs on your skin by washing with CHG (chlorahexidine gluconate) Soap before surgery.  CHG is an antiseptic cleaner which kills germs and bonds with the skin to continue killing germs even after washing.  Please do not use if you have an allergy to CHG or antibacterial soaps. If your skin becomes reddened/irritated stop using the CHG.  Do not shave (including legs and underarms) for at least 48 hours prior to first CHG shower. It is OK to shave your face.  Please follow these instructions carefully.   1. Shower the NIGHT BEFORE SURGERY and the MORNING OF SURGERY with CHG.   2. If you chose to wash your hair, wash your hair first as usual with your normal shampoo.  3. After you shampoo, rinse your hair and body thoroughly to remove the shampoo.  4. Use CHG as you would any other liquid soap. You can apply CHG directly to the skin and wash gently with a scrungie or a clean washcloth.   5. Apply the CHG Soap to your body ONLY FROM THE NECK DOWN.  Do not use on open wounds or open sores. Avoid contact with your eyes, ears, mouth and genitals (private parts). Wash genitals (private parts) with your normal soap.  6. Wash thoroughly, paying special attention to the area where your surgery will be performed.  7. Thoroughly rinse your body with warm water from the neck down.  8. DO NOT shower/wash with your normal  soap after using and rinsing off the CHG Soap.  9. Pat yourself dry with a CLEAN TOWEL.   10. Wear CLEAN PAJAMAS   11. Place CLEAN SHEETS on your bed the night of your first shower and DO NOT SLEEP WITH PETS.    Day of Surgery: Do not apply any deodorants/lotions. Please wear clean clothes to the hospital/surgery center.      Please read over the following fact sheets that you were given.

## 2016-11-08 ENCOUNTER — Encounter (HOSPITAL_COMMUNITY)
Admission: RE | Admit: 2016-11-08 | Discharge: 2016-11-08 | Disposition: A | Discharge status: Home / Self Care | Payer: Medicare Other | Source: Ambulatory Visit | Attending: Orthopaedic Surgery | Admitting: Orthopaedic Surgery

## 2016-11-08 ENCOUNTER — Ambulatory Visit (HOSPITAL_COMMUNITY)
Admission: RE | Admit: 2016-11-08 | Discharge: 2016-11-08 | Disposition: A | Discharge status: Home / Self Care | Payer: Medicare Other | Source: Ambulatory Visit | Attending: Orthopedic Surgery | Admitting: Orthopedic Surgery

## 2016-11-08 ENCOUNTER — Telehealth: Payer: Self-pay | Admitting: Family Medicine

## 2016-11-08 ENCOUNTER — Encounter (HOSPITAL_COMMUNITY): Payer: Self-pay

## 2016-11-08 ENCOUNTER — Other Ambulatory Visit: Payer: Self-pay

## 2016-11-08 DIAGNOSIS — Z01810 Encounter for preprocedural cardiovascular examination: Secondary | ICD-10-CM | POA: Insufficient documentation

## 2016-11-08 DIAGNOSIS — Z01812 Encounter for preprocedural laboratory examination: Secondary | ICD-10-CM | POA: Diagnosis not present

## 2016-11-08 DIAGNOSIS — R9431 Abnormal electrocardiogram [ECG] [EKG]: Secondary | ICD-10-CM | POA: Insufficient documentation

## 2016-11-08 DIAGNOSIS — Z01818 Encounter for other preprocedural examination: Secondary | ICD-10-CM

## 2016-11-08 DIAGNOSIS — M1712 Unilateral primary osteoarthritis, left knee: Secondary | ICD-10-CM | POA: Insufficient documentation

## 2016-11-08 HISTORY — DX: Myoneural disorder, unspecified: G70.9

## 2016-11-08 LAB — COMPREHENSIVE METABOLIC PANEL
ALBUMIN: 3.9 g/dL (ref 3.5–5.0)
ALK PHOS: 56 U/L (ref 38–126)
ALT: 25 U/L (ref 14–54)
ANION GAP: 7 (ref 5–15)
AST: 25 U/L (ref 15–41)
BILIRUBIN TOTAL: 0.9 mg/dL (ref 0.3–1.2)
BUN: 12 mg/dL (ref 6–20)
CALCIUM: 9.7 mg/dL (ref 8.9–10.3)
CO2: 24 mmol/L (ref 22–32)
Chloride: 103 mmol/L (ref 101–111)
Creatinine, Ser: 0.58 mg/dL (ref 0.44–1.00)
GLUCOSE: 108 mg/dL — AB (ref 65–99)
Potassium: 3.9 mmol/L (ref 3.5–5.1)
Sodium: 134 mmol/L — ABNORMAL LOW (ref 135–145)
TOTAL PROTEIN: 7.6 g/dL (ref 6.5–8.1)

## 2016-11-08 LAB — CBC WITH DIFFERENTIAL/PLATELET
Basophils Absolute: 0 10*3/uL (ref 0.0–0.1)
Basophils Relative: 0 %
Eosinophils Absolute: 0.1 10*3/uL (ref 0.0–0.7)
Eosinophils Relative: 1 %
HEMATOCRIT: 36.3 % (ref 36.0–46.0)
HEMOGLOBIN: 12.1 g/dL (ref 12.0–15.0)
LYMPHS ABS: 2.9 10*3/uL (ref 0.7–4.0)
LYMPHS PCT: 40 %
MCH: 29.7 pg (ref 26.0–34.0)
MCHC: 33.3 g/dL (ref 30.0–36.0)
MCV: 89 fL (ref 78.0–100.0)
MONOS PCT: 5 %
Monocytes Absolute: 0.4 10*3/uL (ref 0.1–1.0)
NEUTROS ABS: 3.8 10*3/uL (ref 1.7–7.7)
NEUTROS PCT: 54 %
Platelets: 261 10*3/uL (ref 150–400)
RBC: 4.08 MIL/uL (ref 3.87–5.11)
RDW: 13.2 % (ref 11.5–15.5)
WBC: 7.2 10*3/uL (ref 4.0–10.5)

## 2016-11-08 LAB — URINALYSIS, ROUTINE W REFLEX MICROSCOPIC
Bilirubin Urine: NEGATIVE
GLUCOSE, UA: NEGATIVE mg/dL
Ketones, ur: NEGATIVE mg/dL
NITRITE: NEGATIVE
Protein, ur: NEGATIVE mg/dL
SPECIFIC GRAVITY, URINE: 1.006 (ref 1.005–1.030)
pH: 5 (ref 5.0–8.0)

## 2016-11-08 LAB — SURGICAL PCR SCREEN
MRSA, PCR: NEGATIVE
Staphylococcus aureus: NEGATIVE

## 2016-11-08 LAB — PROTIME-INR
INR: 1.07
Prothrombin Time: 14 seconds (ref 11.4–15.2)

## 2016-11-08 LAB — APTT: aPTT: 31 seconds (ref 24–36)

## 2016-11-08 MED ORDER — GABAPENTIN 100 MG PO CAPS: 100.0000 mg | ORAL_CAPSULE | Freq: Three times a day (TID) | ORAL | 3 refills | Status: DC

## 2016-11-08 NOTE — Telephone Encounter (Signed)
Rx has been sent in as directed.  Thanks!

## 2016-11-08 NOTE — Telephone Encounter (Signed)
Pt states the   gabapentin (NEURONTIN) 100 MG capsule  Never made it to the pharmacy. Pt needs it to go to  [facility], [place] - [address]  (not [facility])  Do you mind sending this in?  Pt states she has very bad foot pain! thank you!!

## 2016-11-08 NOTE — Progress Notes (Signed)
Pt. Followed by Dr. Elease Hashimoto for PCP, pt. Denies any cardiac testing, other then routine, occasional EKG. Pt. Denies all chest concerns or changes.

## 2016-11-09 ENCOUNTER — Telehealth: Payer: Self-pay | Admitting: Family Medicine

## 2016-11-09 DIAGNOSIS — G629 Polyneuropathy, unspecified: Secondary | ICD-10-CM

## 2016-11-09 LAB — URINE CULTURE: CULTURE: NO GROWTH

## 2016-11-09 NOTE — Telephone Encounter (Deleted)
[phone] °

## 2016-11-09 NOTE — Telephone Encounter (Signed)
With go ahead with referral to neurologist in our division. They can discuss with her nerve conduction studies (sensory neuropathy LLE).  We could not see any evidence for motor involvement on exam

## 2016-11-09 NOTE — Telephone Encounter (Addendum)
Patient's daughter is calling in asking for a referral for neurology for a Nerve Conduction/EMG study.  If this referral needs the patient to be seen then daughter would like to know so that can be scheduled as soon as possible.  Daughter is Suzi Roots and is on [place]. Daughter states that Dr. Elease Hashimoto has seen her for this before.  Extreme left leg weakness due to the neuropathy.  Daughter's # is [phone]

## 2016-11-09 NOTE — Telephone Encounter (Signed)
Referral placed.

## 2016-11-09 NOTE — Telephone Encounter (Signed)
Pt would like to have a call back to discuss some of her symptoms.

## 2016-11-15 ENCOUNTER — Telehealth: Payer: Self-pay | Admitting: Rheumatology

## 2016-11-15 NOTE — Telephone Encounter (Signed)
done

## 2016-11-15 NOTE — Telephone Encounter (Signed)
Patient lost purse with Handicap form in it. Patient needs another form signed. Please call patient when form ready to pick up.

## 2016-11-15 NOTE — Telephone Encounter (Signed)
OK- please complete and I will sign

## 2016-11-15 NOTE — Telephone Encounter (Signed)
Ok

## 2016-11-19 MED ORDER — TRANEXAMIC ACID 1000 MG/10ML IV SOLN
2000.0000 mg | INTRAVENOUS | Status: AC
  Administered 2016-11-20: 2000 mg via TOPICAL
  Filled 2016-11-19: qty 20

## 2016-11-19 MED ORDER — ACETAMINOPHEN 10 MG/ML IV SOLN
1000.0000 mg | Freq: Once | INTRAVENOUS | Status: AC
  Administered 2016-11-20: 1000 mg via INTRAVENOUS
  Filled 2016-11-19: qty 100

## 2016-11-20 ENCOUNTER — Inpatient Hospital Stay (HOSPITAL_COMMUNITY): Payer: Medicare Other | Admitting: Anesthesiology

## 2016-11-20 ENCOUNTER — Encounter (HOSPITAL_COMMUNITY): Payer: Self-pay

## 2016-11-20 ENCOUNTER — Encounter (HOSPITAL_COMMUNITY)
Admission: RE | Disposition: A | Discharge status: Skilled Nursing Facility | Payer: Self-pay | Source: Ambulatory Visit | Attending: Orthopaedic Surgery

## 2016-11-20 ENCOUNTER — Inpatient Hospital Stay (HOSPITAL_COMMUNITY)
Admission: RE | Admit: 2016-11-20 | Discharge: 2016-11-23 | DRG: 470 | Disposition: A | Discharge status: Skilled Nursing Facility | Payer: Medicare Other | Source: Ambulatory Visit | Attending: Orthopaedic Surgery | Admitting: Orthopaedic Surgery

## 2016-11-20 DIAGNOSIS — Z833 Family history of diabetes mellitus: Secondary | ICD-10-CM

## 2016-11-20 DIAGNOSIS — Z96652 Presence of left artificial knee joint: Secondary | ICD-10-CM

## 2016-11-20 DIAGNOSIS — D62 Acute posthemorrhagic anemia: Secondary | ICD-10-CM | POA: Diagnosis not present

## 2016-11-20 DIAGNOSIS — Z8249 Family history of ischemic heart disease and other diseases of the circulatory system: Secondary | ICD-10-CM | POA: Diagnosis not present

## 2016-11-20 DIAGNOSIS — M6281 Muscle weakness (generalized): Secondary | ICD-10-CM | POA: Diagnosis not present

## 2016-11-20 DIAGNOSIS — Z86718 Personal history of other venous thrombosis and embolism: Secondary | ICD-10-CM | POA: Diagnosis not present

## 2016-11-20 DIAGNOSIS — E039 Hypothyroidism, unspecified: Secondary | ICD-10-CM | POA: Diagnosis not present

## 2016-11-20 DIAGNOSIS — Z5181 Encounter for therapeutic drug level monitoring: Secondary | ICD-10-CM | POA: Diagnosis not present

## 2016-11-20 DIAGNOSIS — I10 Essential (primary) hypertension: Secondary | ICD-10-CM | POA: Diagnosis not present

## 2016-11-20 DIAGNOSIS — M659 Synovitis and tenosynovitis, unspecified: Secondary | ICD-10-CM | POA: Diagnosis present

## 2016-11-20 DIAGNOSIS — R2689 Other abnormalities of gait and mobility: Secondary | ICD-10-CM | POA: Diagnosis not present

## 2016-11-20 DIAGNOSIS — M8580 Other specified disorders of bone density and structure, unspecified site: Secondary | ICD-10-CM | POA: Diagnosis not present

## 2016-11-20 DIAGNOSIS — M1712 Unilateral primary osteoarthritis, left knee: Principal | ICD-10-CM | POA: Diagnosis present

## 2016-11-20 DIAGNOSIS — Z4789 Encounter for other orthopedic aftercare: Secondary | ICD-10-CM | POA: Diagnosis not present

## 2016-11-20 DIAGNOSIS — M25559 Pain in unspecified hip: Secondary | ICD-10-CM | POA: Diagnosis not present

## 2016-11-20 DIAGNOSIS — Z96651 Presence of right artificial knee joint: Secondary | ICD-10-CM | POA: Diagnosis present

## 2016-11-20 DIAGNOSIS — G8918 Other acute postprocedural pain: Secondary | ICD-10-CM | POA: Diagnosis not present

## 2016-11-20 DIAGNOSIS — R278 Other lack of coordination: Secondary | ICD-10-CM | POA: Diagnosis not present

## 2016-11-20 DIAGNOSIS — S8002XA Contusion of left knee, initial encounter: Secondary | ICD-10-CM | POA: Diagnosis not present

## 2016-11-20 HISTORY — PX: TOTAL KNEE ARTHROPLASTY: SHX125

## 2016-11-20 LAB — TYPE AND SCREEN
ABO/RH(D): AB POS
ANTIBODY SCREEN: NEGATIVE

## 2016-11-20 SURGERY — ARTHROPLASTY, KNEE, TOTAL
Anesthesia: Spinal | Site: Knee | Laterality: Left

## 2016-11-20 MED ORDER — SODIUM CHLORIDE 0.9 % IV SOLN
INTRAVENOUS | Status: DC
  Administered 2016-11-20: 13:00:00 via INTRAVENOUS

## 2016-11-20 MED ORDER — SUCCINYLCHOLINE CHLORIDE 200 MG/10ML IV SOSY
PREFILLED_SYRINGE | INTRAVENOUS | Status: AC
  Filled 2016-11-20: qty 10

## 2016-11-20 MED ORDER — METOCLOPRAMIDE HCL 5 MG/ML IJ SOLN
5.0000 mg | Freq: Three times a day (TID) | INTRAMUSCULAR | Status: DC | PRN
  Administered 2016-11-22: 10 mg via INTRAVENOUS
  Filled 2016-11-20: qty 2

## 2016-11-20 MED ORDER — ALUM & MAG HYDROXIDE-SIMETH 200-200-20 MG/5ML PO SUSP
30.0000 mL | ORAL | Status: DC | PRN
  Administered 2016-11-22 (×3): 30 mL via ORAL
  Filled 2016-11-20 (×3): qty 30

## 2016-11-20 MED ORDER — FENTANYL CITRATE (PF) 100 MCG/2ML IJ SOLN
INTRAMUSCULAR | Status: DC | PRN
  Administered 2016-11-20: 100 ug via INTRAVENOUS

## 2016-11-20 MED ORDER — METOCLOPRAMIDE HCL 5 MG/ML IJ SOLN: 10.0000 mg | Freq: Once | INTRAMUSCULAR | Status: DC | PRN

## 2016-11-20 MED ORDER — FENTANYL CITRATE (PF) 100 MCG/2ML IJ SOLN: 25.0000 ug | INTRAMUSCULAR | Status: DC | PRN

## 2016-11-20 MED ORDER — LIDOCAINE HCL (CARDIAC) 20 MG/ML IV SOLN
INTRAVENOUS | Status: AC
  Filled 2016-11-20: qty 5

## 2016-11-20 MED ORDER — VITAMIN D 1000 UNITS PO TABS
1000.0000 [IU] | ORAL_TABLET | Freq: Every day | ORAL | Status: DC
  Administered 2016-11-20 – 2016-11-23 (×4): 1000 [IU] via ORAL
  Filled 2016-11-20 (×4): qty 1

## 2016-11-20 MED ORDER — MIDAZOLAM HCL 2 MG/2ML IJ SOLN
INTRAMUSCULAR | Status: AC
  Filled 2016-11-20: qty 2

## 2016-11-20 MED ORDER — KETOROLAC TROMETHAMINE 15 MG/ML IJ SOLN
7.5000 mg | Freq: Four times a day (QID) | INTRAMUSCULAR | Status: AC
  Administered 2016-11-20 – 2016-11-21 (×4): 7.5 mg via INTRAVENOUS
  Filled 2016-11-20 (×4): qty 1

## 2016-11-20 MED ORDER — PROPOFOL 500 MG/50ML IV EMUL
INTRAVENOUS | Status: DC | PRN
  Administered 2016-11-20: 50 ug/kg/min via INTRAVENOUS

## 2016-11-20 MED ORDER — MENTHOL 3 MG MT LOZG: 1.0000 | LOZENGE | OROMUCOSAL | Status: DC | PRN

## 2016-11-20 MED ORDER — CEFAZOLIN SODIUM-DEXTROSE 2-4 GM/100ML-% IV SOLN
2.0000 g | Freq: Four times a day (QID) | INTRAVENOUS | Status: AC
  Administered 2016-11-20 (×2): 2 g via INTRAVENOUS
  Filled 2016-11-20 (×2): qty 100

## 2016-11-20 MED ORDER — ACETAMINOPHEN 10 MG/ML IV SOLN
INTRAVENOUS | Status: AC
  Filled 2016-11-20: qty 100

## 2016-11-20 MED ORDER — CALCIUM CARBONATE 1250 (500 CA) MG PO TABS
600.0000 mg | ORAL_TABLET | Freq: Every day | ORAL | Status: DC
  Administered 2016-11-20 – 2016-11-23 (×4): 625 mg via ORAL
  Filled 2016-11-20 (×4): qty 1

## 2016-11-20 MED ORDER — MIDAZOLAM HCL 5 MG/5ML IJ SOLN
INTRAMUSCULAR | Status: DC | PRN
  Administered 2016-11-20: 2 mg via INTRAVENOUS

## 2016-11-20 MED ORDER — BUPIVACAINE-EPINEPHRINE 0.25% -1:200000 IJ SOLN
INTRAMUSCULAR | Status: AC
  Filled 2016-11-20: qty 1

## 2016-11-20 MED ORDER — RIVAROXABAN 10 MG PO TABS
10.0000 mg | ORAL_TABLET | Freq: Every day | ORAL | Status: DC
  Administered 2016-11-21 – 2016-11-23 (×3): 10 mg via ORAL
  Filled 2016-11-20 (×3): qty 1

## 2016-11-20 MED ORDER — LACTATED RINGERS IV SOLN
INTRAVENOUS | Status: DC | PRN
  Administered 2016-11-20 (×2): via INTRAVENOUS

## 2016-11-20 MED ORDER — PHENYLEPHRINE HCL 10 MG/ML IJ SOLN
INTRAVENOUS | Status: DC | PRN
  Administered 2016-11-20: 40 ug/min via INTRAVENOUS

## 2016-11-20 MED ORDER — MEPERIDINE HCL 25 MG/ML IJ SOLN: 6.2500 mg | INTRAMUSCULAR | Status: DC | PRN

## 2016-11-20 MED ORDER — ONDANSETRON HCL 4 MG/2ML IJ SOLN: 4.0000 mg | Freq: Four times a day (QID) | INTRAMUSCULAR | Status: DC | PRN

## 2016-11-20 MED ORDER — SODIUM CHLORIDE 0.9 % IR SOLN
Status: DC | PRN
  Administered 2016-11-20: 3000 mL

## 2016-11-20 MED ORDER — FENTANYL CITRATE (PF) 250 MCG/5ML IJ SOLN
INTRAMUSCULAR | Status: AC
  Filled 2016-11-20: qty 5

## 2016-11-20 MED ORDER — CHLORHEXIDINE GLUCONATE 4 % EX LIQD: 60.0000 mL | Freq: Once | CUTANEOUS | Status: DC

## 2016-11-20 MED ORDER — SODIUM CHLORIDE 0.9 % IV SOLN: INTRAVENOUS | Status: DC

## 2016-11-20 MED ORDER — OXYCODONE HCL 5 MG PO TABS
ORAL_TABLET | ORAL | Status: AC
  Filled 2016-11-20: qty 1

## 2016-11-20 MED ORDER — BISACODYL 10 MG RE SUPP: 10.0000 mg | Freq: Every day | RECTAL | Status: DC | PRN

## 2016-11-20 MED ORDER — B COMPLEX PO TABS: 1.0000 | ORAL_TABLET | ORAL | Status: DC

## 2016-11-20 MED ORDER — METOCLOPRAMIDE HCL 5 MG PO TABS: 5.0000 mg | ORAL_TABLET | Freq: Three times a day (TID) | ORAL | Status: DC | PRN

## 2016-11-20 MED ORDER — METHOCARBAMOL 1000 MG/10ML IJ SOLN: 500.0000 mg | Freq: Four times a day (QID) | INTRAVENOUS | Status: DC | PRN

## 2016-11-20 MED ORDER — BUPIVACAINE-EPINEPHRINE 0.25% -1:200000 IJ SOLN
INTRAMUSCULAR | Status: DC | PRN
  Administered 2016-11-20: 30 mL

## 2016-11-20 MED ORDER — PHENYLEPHRINE 40 MCG/ML (10ML) SYRINGE FOR IV PUSH (FOR BLOOD PRESSURE SUPPORT)
PREFILLED_SYRINGE | INTRAVENOUS | Status: AC
  Filled 2016-11-20: qty 10

## 2016-11-20 MED ORDER — LACTATED RINGERS IV SOLN: INTRAVENOUS | Status: DC

## 2016-11-20 MED ORDER — DOCUSATE SODIUM 100 MG PO CAPS
100.0000 mg | ORAL_CAPSULE | Freq: Two times a day (BID) | ORAL | Status: DC
  Administered 2016-11-20 – 2016-11-23 (×6): 100 mg via ORAL
  Filled 2016-11-20 (×6): qty 1

## 2016-11-20 MED ORDER — METHOCARBAMOL 500 MG PO TABS
ORAL_TABLET | ORAL | Status: AC
Start: 2016-11-20 — End: 2016-11-20
  Filled 2016-11-20: qty 1

## 2016-11-20 MED ORDER — ONDANSETRON HCL 4 MG PO TABS: 4.0000 mg | ORAL_TABLET | Freq: Four times a day (QID) | ORAL | Status: DC | PRN

## 2016-11-20 MED ORDER — ONDANSETRON HCL 4 MG/2ML IJ SOLN
INTRAMUSCULAR | Status: DC | PRN
  Administered 2016-11-20: 4 mg via INTRAVENOUS

## 2016-11-20 MED ORDER — ONDANSETRON HCL 4 MG/2ML IJ SOLN
INTRAMUSCULAR | Status: AC
  Filled 2016-11-20: qty 2

## 2016-11-20 MED ORDER — ACETAMINOPHEN 10 MG/ML IV SOLN
1000.0000 mg | Freq: Four times a day (QID) | INTRAVENOUS | Status: AC
  Administered 2016-11-20 – 2016-11-21 (×4): 1000 mg via INTRAVENOUS
  Filled 2016-11-20 (×4): qty 100

## 2016-11-20 MED ORDER — MAGNESIUM CITRATE PO SOLN
1.0000 | Freq: Once | ORAL | Status: AC | PRN
  Administered 2016-11-22: 1 via ORAL
  Filled 2016-11-20: qty 296

## 2016-11-20 MED ORDER — HYDROMORPHONE HCL 1 MG/ML IJ SOLN
0.5000 mg | INTRAMUSCULAR | Status: DC | PRN
  Administered 2016-11-21: 0.5 mg via INTRAVENOUS
  Filled 2016-11-20: qty 1

## 2016-11-20 MED ORDER — CEFAZOLIN SODIUM-DEXTROSE 2-4 GM/100ML-% IV SOLN
2.0000 g | INTRAVENOUS | Status: AC
  Administered 2016-11-20: 2 g via INTRAVENOUS
  Filled 2016-11-20: qty 100

## 2016-11-20 MED ORDER — 0.9 % SODIUM CHLORIDE (POUR BTL) OPTIME
TOPICAL | Status: DC | PRN
  Administered 2016-11-20: 1000 mL

## 2016-11-20 MED ORDER — LEVOTHYROXINE SODIUM 50 MCG PO TABS
50.0000 ug | ORAL_TABLET | Freq: Every day | ORAL | Status: DC
  Administered 2016-11-21 – 2016-11-23 (×2): 50 ug via ORAL
  Filled 2016-11-20 (×3): qty 1

## 2016-11-20 MED ORDER — DIPHENHYDRAMINE HCL 12.5 MG/5ML PO ELIX: 12.5000 mg | ORAL_SOLUTION | ORAL | Status: DC | PRN

## 2016-11-20 MED ORDER — BUPIVACAINE-EPINEPHRINE (PF) 0.5% -1:200000 IJ SOLN
INTRAMUSCULAR | Status: DC | PRN
  Administered 2016-11-20: 30 mL via PERINEURAL

## 2016-11-20 MED ORDER — METHOCARBAMOL 500 MG PO TABS
500.0000 mg | ORAL_TABLET | Freq: Four times a day (QID) | ORAL | Status: DC | PRN
  Administered 2016-11-20 – 2016-11-22 (×3): 500 mg via ORAL
  Filled 2016-11-20 (×2): qty 1

## 2016-11-20 MED ORDER — OXYCODONE HCL 5 MG PO TABS
5.0000 mg | ORAL_TABLET | ORAL | Status: DC | PRN
  Administered 2016-11-20: 5 mg via ORAL
  Administered 2016-11-20 – 2016-11-22 (×9): 10 mg via ORAL
  Administered 2016-11-23 (×2): 5 mg via ORAL
  Filled 2016-11-20 (×12): qty 2

## 2016-11-20 MED ORDER — IRBESARTAN 150 MG PO TABS
150.0000 mg | ORAL_TABLET | Freq: Every day | ORAL | Status: DC
  Administered 2016-11-21 – 2016-11-23 (×3): 150 mg via ORAL
  Filled 2016-11-20 (×3): qty 1

## 2016-11-20 MED ORDER — PHENOL 1.4 % MT LIQD: 1.0000 | OROMUCOSAL | Status: DC | PRN

## 2016-11-20 MED ORDER — GABAPENTIN 100 MG PO CAPS
100.0000 mg | ORAL_CAPSULE | Freq: Three times a day (TID) | ORAL | Status: DC
  Administered 2016-11-20 – 2016-11-23 (×9): 100 mg via ORAL
  Filled 2016-11-20 (×9): qty 1

## 2016-11-20 MED ORDER — ROCURONIUM BROMIDE 50 MG/5ML IV SOLN
INTRAVENOUS | Status: AC
  Filled 2016-11-20: qty 1

## 2016-11-20 MED ORDER — POLYETHYLENE GLYCOL 3350 17 G PO PACK
17.0000 g | PACK | Freq: Every day | ORAL | Status: DC | PRN
  Administered 2016-11-22: 17 g via ORAL
  Filled 2016-11-20: qty 1

## 2016-11-20 SURGICAL SUPPLY — 57 items
BAG DECANTER FOR FLEXI CONT (MISCELLANEOUS) ×2 IMPLANT
BANDAGE ESMARK 6X9 LF (GAUZE/BANDAGES/DRESSINGS) ×1 IMPLANT
BLADE SAGITTAL 25.0X1.19X90 (BLADE) ×2 IMPLANT
BNDG ESMARK 6X9 LF (GAUZE/BANDAGES/DRESSINGS) ×2
BOWL SMART MIX CTS (DISPOSABLE) ×2 IMPLANT
CAP KNEE TOTAL 3 SIGMA ×2 IMPLANT
CEMENT HV SMART SET (Cement) ×4 IMPLANT
COVER SURGICAL LIGHT HANDLE (MISCELLANEOUS) ×2 IMPLANT
CUFF TOURNIQUET SINGLE 34IN LL (TOURNIQUET CUFF) ×2 IMPLANT
CUFF TOURNIQUET SINGLE 44IN (TOURNIQUET CUFF) IMPLANT
DECANTER SPIKE VIAL GLASS SM (MISCELLANEOUS) ×2 IMPLANT
DRAPE EXTREMITY T 121X128X90 (DRAPE) IMPLANT
DRAPE HALF SHEET 40X57 (DRAPES) ×4 IMPLANT
DRSG ADAPTIC 3X8 NADH LF (GAUZE/BANDAGES/DRESSINGS) ×2 IMPLANT
DRSG PAD ABDOMINAL 8X10 ST (GAUZE/BANDAGES/DRESSINGS) ×4 IMPLANT
DURAPREP 26ML APPLICATOR (WOUND CARE) ×4 IMPLANT
ELECT CAUTERY BLADE 6.4 (BLADE) ×2 IMPLANT
ELECT REM PT RETURN 9FT ADLT (ELECTROSURGICAL) ×2
ELECTRODE REM PT RTRN 9FT ADLT (ELECTROSURGICAL) ×1 IMPLANT
EVACUATOR 1/8 PVC DRAIN (DRAIN) ×2 IMPLANT
FACESHIELD WRAPAROUND (MASK) ×4 IMPLANT
GAUZE SPONGE 4X4 12PLY STRL (GAUZE/BANDAGES/DRESSINGS) ×2 IMPLANT
GLOVE BIOGEL PI IND STRL 8 (GLOVE) ×1 IMPLANT
GLOVE BIOGEL PI IND STRL 8.5 (GLOVE) ×1 IMPLANT
GLOVE BIOGEL PI INDICATOR 8 (GLOVE) ×1
GLOVE BIOGEL PI INDICATOR 8.5 (GLOVE) ×1
GLOVE ECLIPSE 8.0 STRL XLNG CF (GLOVE) ×6 IMPLANT
GLOVE SURG ORTHO 8.5 STRL (GLOVE) ×6 IMPLANT
GOWN STRL REUS W/ TWL LRG LVL3 (GOWN DISPOSABLE) ×2 IMPLANT
GOWN STRL REUS W/TWL 2XL LVL3 (GOWN DISPOSABLE) ×2 IMPLANT
GOWN STRL REUS W/TWL LRG LVL3 (GOWN DISPOSABLE) ×2
HANDPIECE INTERPULSE COAX TIP (DISPOSABLE) ×1
KIT BASIN OR (CUSTOM PROCEDURE TRAY) ×2 IMPLANT
KIT ROOM TURNOVER OR (KITS) ×2 IMPLANT
MANIFOLD NEPTUNE II (INSTRUMENTS) ×2 IMPLANT
NEEDLE 22X1 1/2 (OR ONLY) (NEEDLE) ×2 IMPLANT
NS IRRIG 1000ML POUR BTL (IV SOLUTION) ×2 IMPLANT
PACK TOTAL JOINT (CUSTOM PROCEDURE TRAY) ×2 IMPLANT
PAD ARMBOARD 7.5X6 YLW CONV (MISCELLANEOUS) ×4 IMPLANT
PAD CAST 4YDX4 CTTN HI CHSV (CAST SUPPLIES) ×1 IMPLANT
PADDING CAST COTTON 4X4 STRL (CAST SUPPLIES) ×1
PADDING CAST COTTON 6X4 STRL (CAST SUPPLIES) ×2 IMPLANT
SET HNDPC FAN SPRY TIP SCT (DISPOSABLE) ×1 IMPLANT
STAPLER VISISTAT 35W (STAPLE) ×2 IMPLANT
SUCTION FRAZIER HANDLE 10FR (MISCELLANEOUS) ×1
SUCTION TUBE FRAZIER 10FR DISP (MISCELLANEOUS) ×1 IMPLANT
SURGIFLO W/THROMBIN 8M KIT (HEMOSTASIS) IMPLANT
SUT BONE WAX W31G (SUTURE) ×2 IMPLANT
SUT ETHIBOND NAB CT1 #1 30IN (SUTURE) ×4 IMPLANT
SUT MNCRL AB 3-0 PS2 18 (SUTURE) ×2 IMPLANT
SUT VIC AB 0 CT1 27 (SUTURE) ×1
SUT VIC AB 0 CT1 27XBRD ANBCTR (SUTURE) ×1 IMPLANT
SYR CONTROL 10ML LL (SYRINGE) IMPLANT
TOWEL OR 17X24 6PK STRL BLUE (TOWEL DISPOSABLE) ×2 IMPLANT
TOWEL OR 17X26 10 PK STRL BLUE (TOWEL DISPOSABLE) ×2 IMPLANT
TRAY FOLEY BAG SILVER LF 16FR (SET/KITS/TRAYS/PACK) ×2 IMPLANT
WRAP KNEE MAXI GEL POST OP (GAUZE/BANDAGES/DRESSINGS) ×2 IMPLANT

## 2016-11-20 NOTE — Social Work (Addendum)
Patient/famiily have pre-registered with [facility] at discharge. Pt will need to have qualifying stay to discharge to SNF.  CSW awaiting PT/OT evaluation to send out to SNF.  Passr obtained. FL2 pending.  Elissa Hefty, LCSW Clinical Social Worker [phone]

## 2016-11-20 NOTE — H&P (Signed)
The recent History & Physical has been reviewed. I have personally examined the patient today. There is no interval change to the documented History & Physical. The patient would like to proceed with the procedure.  Garald Balding 11/20/2016,  7:07 AM

## 2016-11-20 NOTE — Evaluation (Signed)
Physical Therapy Evaluation Patient Details Name: Kimberly [name] MRN: [id number] DOB: [birth year] Today's Date: 11/20/2016   History of Present Illness  Pt is a 66 y/o female s/p L TKA secondary to L knee OA. PMH includes DVT, metabolic syndrome, HTN, osteopenia and R TKA.   Clinical Impression  Pt is s/p surgery above with deficits below. PTA, pt was independent with functional mobility. Limited eval, as pt bleeding from drain site upon performance of RLE exercise. Notified RN, and RN requested to hold PT. Verbally reviewed remainder of supine exercises with pt and discussed role of acute PT. Reviewed WB status with pt and demonstrated appropriate use of RW. Will need to evaluate mobility in next session. Pt reports she will be going to SNF at d/c. Given current mobility limitations, feel SNF is appropriate to maximize functional mobility independence. Will continue to follow acutely and progress mobility according to pt tolerance.     Follow Up Recommendations SNF    Equipment Recommendations  None recommended by PT    Recommendations for Other Services       Precautions / Restrictions Precautions Precautions: Knee Precaution Booklet Issued: Yes (comment) Precaution Comments: Hemovac; Limited practice of ther ex secondary to new incision site bleed. Verbally reviewed all exercises with pt.  Restrictions Weight Bearing Restrictions: Yes LLE Weight Bearing: Partial weight bearing LLE Partial Weight Bearing Percentage or Pounds: 50%      Mobility  Bed Mobility               General bed mobility comments: Held mobility secondary to L knee incision site bleeding.   Transfers                    Ambulation/Gait                Stairs            Wheelchair Mobility    Modified Rankin (Stroke Patients Only)       Balance                                             Pertinent Vitals/Pain Pain Assessment: 0-10 Pain Score: 7  Pain  Location: L knee  Pain Descriptors / Indicators: Aching;Operative site guarding;Sore Pain Intervention(s): Limited activity within patient's tolerance;Monitored during session;Repositioned    Home Living Family/patient expects to be discharged to:: Skilled nursing facility                 Additional Comments: [facility]     Prior Function Level of Independence: Independent               Hand Dominance   Dominant Hand: Right    Extremity/Trunk Assessment   Upper Extremity Assessment Upper Extremity Assessment: Defer to OT evaluation    Lower Extremity Assessment Lower Extremity Assessment: LLE deficits/detail LLE Deficits / Details: Sensory in tact. Deficits consistent with post op pain and weakness. Limited exercise tolerance secondary to new incision site bleeding.        Communication   Communication: No difficulties  Cognition Arousal/Alertness: Awake/alert Behavior During Therapy: WFL for tasks assessed/performed Overall Cognitive Status: Within Functional Limits for tasks assessed  General Comments General comments (skin integrity, edema, etc.): Verbally reviewed ther ex with pt. Notified RN of L incision site bleed and RN requesting to hold PT.     Exercises Total Joint Exercises Ankle Circles/Pumps: AROM;Both;10 reps;Supine Quad Sets: AROM;Left;10 reps;Supine Towel Squeeze: AROM;Both;10 reps;Supine   Assessment/Plan    PT Assessment Patient needs continued PT services  PT Problem List Decreased strength;Decreased range of motion;Decreased activity tolerance;Decreased balance;Decreased mobility;Decreased knowledge of use of DME;Decreased knowledge of precautions;Pain       PT Treatment Interventions DME instruction;Gait training;Functional mobility training;Therapeutic activities;Therapeutic exercise;Balance training;Neuromuscular re-education;Patient/family education    PT Goals (Current  goals can be found in the Care Plan section)  Acute Rehab PT Goals Patient Stated Goal: to go to rehab before home to get stronger  PT Goal Formulation: With patient Time For Goal Achievement: 11/27/16 Potential to Achieve Goals: Good    Frequency 7X/week   Barriers to discharge        Co-evaluation               AM-PAC PT "6 Clicks" Daily Activity  Outcome Measure Difficulty turning over in bed (including adjusting bedclothes, sheets and blankets)?: A Lot Difficulty moving from lying on back to sitting on the side of the bed? : A Lot Difficulty sitting down on and standing up from a chair with arms (e.g., wheelchair, bedside commode, etc,.)?: Total Help needed moving to and from a bed to chair (including a wheelchair)?: A Lot Help needed walking in hospital room?: A Lot Help needed climbing 3-5 steps with a railing? : Total 6 Click Score: 10    End of Session   Activity Tolerance: Treatment limited secondary to medical complications (Comment) (bleeding at incision site ) Patient left: in bed;with call bell/phone within reach;with family/visitor present Nurse Communication: Mobility status;Other (comment) (bleeding at incision site ) PT Visit Diagnosis: Other abnormalities of gait and mobility (R26.89);Pain Pain - Right/Left: Left Pain - part of body: Knee    Time: 9811-9147 PT Time Calculation (min) (ACUTE ONLY): 17 min   Charges:   PT Evaluation $PT Eval Moderate Complexity: 1 Procedure     PT G Codes:        Leighton Ruff, PT, DPT  Acute Rehabilitation Services  Pager: [phone]   Rudean Hitt 11/20/2016, 5:31 PM

## 2016-11-20 NOTE — Transfer of Care (Signed)
Immediate Anesthesia Transfer of Care Note  Patient: Kimberly [name]  Procedure(s) Performed: Procedure(s): LEFT TOTAL KNEE ARTHROPLASTY (Left)  Patient Location: PACU  Anesthesia Type:Regional and Spinal  Level of Consciousness: awake, alert  and oriented  Airway & Oxygen Therapy: Patient Spontanous Breathing and Patient connected to nasal cannula oxygen  Post-op Assessment: Report given to RN and Post -op Vital signs reviewed and stable  Post vital signs: stable  Last Vitals:  Vitals:   11/20/16 0600 11/20/16 0937  BP: (!) 150/77 (!) (P) 99/56  Pulse: 67   Resp: 16   Temp: 36.7 C (!) (P) 36 C    Last Pain:  Vitals:   11/20/16 0937  TempSrc:   PainSc: (P) 0-No pain      Patients Stated Pain Goal: 2 (57/89/78 4784)  Complications: No apparent anesthesia complications

## 2016-11-20 NOTE — Anesthesia Preprocedure Evaluation (Signed)
Anesthesia Evaluation  Patient identified by MRN, date of birth, ID band Patient awake    Reviewed: Allergy & Precautions, NPO status , Patient's Chart, lab work & pertinent test results  Airway Mallampati: II  TM Distance: >3 FB Neck ROM: Full    Dental  (+) Teeth Intact, Dental Advisory Given   Pulmonary neg pulmonary ROS,    breath sounds clear to auscultation       Cardiovascular hypertension, + DVT   Rhythm:Regular Rate:Normal     Neuro/Psych negative neurological ROS  negative psych ROS   GI/Hepatic Neg liver ROS, GERD  ,  Endo/Other  Hypothyroidism   Renal/GU negative Renal ROS  negative genitourinary   Musculoskeletal negative musculoskeletal ROS (+)   Abdominal   Peds negative pediatric ROS (+)  Hematology negative hematology ROS (+)   Anesthesia Other Findings   Reproductive/Obstetrics negative OB ROS                             Anesthesia Physical  Anesthesia Plan  ASA: III  Anesthesia Plan: Spinal   Post-op Pain Management:  Regional for Post-op pain   Induction: Intravenous  PONV Risk Score and Plan: 2 and Ondansetron and Dexamethasone  Airway Management Planned: Simple Face Mask  Additional Equipment:   Intra-op Plan:   Post-operative Plan:   Informed Consent: I have reviewed the patients History and Physical, chart, labs and discussed the procedure including the risks, benefits and alternatives for the proposed anesthesia with the patient or authorized representative who has indicated his/her understanding and acceptance.   Dental advisory given  Plan Discussed with: CRNA and Anesthesiologist  Anesthesia Plan Comments:         Anesthesia Quick Evaluation

## 2016-11-20 NOTE — Anesthesia Procedure Notes (Signed)
Date/Time: 11/20/2016 7:25 AM Performed by: Izora Gala Pre-anesthesia Checklist: Patient identified, Emergency Drugs available, Suction available, Patient being monitored and Timeout performed Patient Re-evaluated:Patient Re-evaluated prior to inductionOxygen Delivery Method: Nasal cannula Placement Confirmation: positive ETCO2

## 2016-11-20 NOTE — Op Note (Signed)
NAMEANAHIT, KLUMB                      ACCOUNT NO.:  MEDICAL RECORD NO.:  [id number]  LOCATION:                                 FACILITY:  PHYSICIAN:  Vonna Kotyk. Durward Fortes, M.D.    DATE OF BIRTH:  DATE OF PROCEDURE:  11/20/2016 DATE OF DISCHARGE:                              OPERATIVE REPORT   PREOPERATIVE DIAGNOSIS:  End-stage osteoarthritis, left knee.  POSTOPERATIVE DIAGNOSIS:  End-stage osteoarthritis, left knee.  PROCEDURE:  Left total knee replacement.  SURGEON:  Vonna Kotyk. Durward Fortes, M.D.  ASSISTANT:  Biagio Borg, [facility], was present throughout the operative procedure to ensure its timely completion.  ANESTHESIA:  Spinal with adductor canal block and IV sedation.  COMPLICATIONS:  None.  COMPONENTS:  DePuy LCS medium femoral component, a #3 rotating keeled tibial tray with a 10-mm polyethylene bridging bearing, a metal back 3 PEG rotating patella.  Components were secured with polymethyl methacrylate.  DESCRIPTION OF PROCEDURE:  Ms. Achey was met with her husband in the holding area, and identified the left knee as appropriate operative site and marked it accordingly.  Anesthesia performed an adductor canal block.  The patient was then transported to room #7.  Spinal anesthesia was performed by Anesthesia without difficulty under mild IV sedation. The patient was then placed supine on the operating table.  Nursing staff inserted a Foley catheter.  Urine was clear.  A tourniquet was applied to the left thigh.  The left leg was then prepped with chlorhexidine scrub and DuraPrep x2 from the tourniquet to the tips of the toes.  Sterile draping was performed.  Time-out was called.  Left lower extremity was then elevated and Esmarch exsanguinated with a proximal tourniquet at 350 mmHg.  Time-out was called again.  A midline longitudinal incision was made centered about the patella extending from the superior pouch to tibial tubercle.  Via sharp dissection, incision  carried down to subcutaneous tissue.  First layer of capsule was incised in the midline.  A medial parapatellar incision was then made with a Bovie.  The joint was entered.  There was a small clear yellow joint effusion.  Patella was everted 180 degrees laterally, the knee flexed to 90 degrees.  There was a varus position of the knee, which could be corrected without release.  There was a moderate amount of beefy red synovitis.  Synovectomy was performed.  There were large osteophytes along the medial tibial plateau consistent with varus position of the knee.  There were also osteophytes along the medial and lateral femoral condyle.  These were removed.  I measured a medium femoral component.  First, bony cut was made transversely along the proximal tibia with a 7- degree angle of declination.  After each bony cut on the tibia and femur, I checked the alignment with the external guide.  Subsequent cuts were then made on the femur using the medium femoral guide.  The flexion and extension gaps were perfectly symmetrical at 10 mm.  There was a small fixed flexion contracture preoperatively, which was corrected. MCL and LCL remained intact for procedure.  Retractors were then placed about the medial lateral compartment with a lamina  spreader.  I removed medial and lateral menisci as well as ACL and PCL.  There were no loose bodies.  Osteophytes were removed from the posterior femoral condyle medially and laterally with a 3/4-inch curved osteotome.  The distal femoral cut was made with a 4 degree of valgus. We checked it again to be sure that the flexion extension gaps were symmetrical at 10 mm.  Finishing guide was then applied for the tapered cut in the center hole in the femur.  Retractors were then placed about the tibia.  I measured #3 tibial tray. This was pinned in place.  Center hole was made, but followed by the keeled cut.  With the trial tibial jig in place, I applied a 10  mm polyethylene bridging bearing, followed by the trial medium femoral component.  The entire construct was reduced through a full range of motion with full extension, no opening with varus or valgus stress.  The varus position preoperatively had been corrected to anatomic position.  The patella was prepared by removing approximately 9 mm of bone leaving 13 mm of patella thickness.  The patellar jig was applied 3 holes made. Trial patella was inserted and reduced and through a full range of motion remained stable. Trial components were then removed.  I copiously irrigated the joint with saline solution.  The final components were then impacted with polymethyl methacrylate.  We initially applied the #3 tibial tray followed by the trial 10 mm polyethylene bridging bearing and the medium femoral component.  Any extraneous methacrylate was removed from the periphery of the components.  The patella was applied with methacrylate and a patellar clamp.  At approximately 16 minutes, the methacrylate had matured during which time we irrigated the joint and injected the deep capsule with 0.25% Marcaine with epinephrine.  The trial tibial polyethylene bridging bearing was then removed, and an extraneous methacrylate from the posterior aspect of the knee was removed with a small osteotome.  The final 10 mm polyethylene bridging bearing was inserted.  We again irrigated the joint.  Tourniquet was deflated at 69 minutes.  We applied topical tranexamic acid under compression for about 5 minutes.  We thought the joint was quite dry at that point.  Medium- size Hemovac was inserted through the lateral compartment.  Deep capsule was then closed with a running #1 Ethibond.  Subcu tissue and capsule were closed with 2-0 Vicryl, subcu with 3-0 Monocryl.  Skin closed with skin clips.  Sterile bulky dressing was applied followed by the patient's support stocking.  The patient tolerated the procedure without  complications.     Vonna Kotyk. Durward Fortes, M.D.     PWW/MEDQ  D:  11/20/2016  T:  11/20/2016  Job:  [id number]

## 2016-11-20 NOTE — Clinical Social Work Note (Signed)
Clinical Social Work Assessment  Patient Details  Name: Kimberly [name] MRN: [id number] Date of Birth: September 03, 1950  Date of referral:  11/20/16               Reason for consult:  Facility Placement                Permission sought to share information with:  Facility Sport and exercise psychologist Permission granted to share information::  Yes, Verbal Permission Granted  Name::     Mr. Rote, spouse  Agency::  SNF  Relationship::     Contact Information:     Housing/Transportation Living arrangements for the past 2 months:  Single Family Home Source of Information:  Patient, Spouse Patient Interpreter Needed:  None Criminal Activity/Legal Involvement Pertinent to Current Situation/Hospitalization:  No - Comment as needed Significant Relationships:  Spouse Lives with:  Spouse Do you feel safe going back to the place where you live?  No Need for family participation in patient care:  Yes (Comment)  Care giving concerns:  CSW met with patient and spouse at bedside. Pt resided at home prior hospitalization. Patient not safe to return home and has pre registered at [facility] facility for short term rehab.  Social Worker assessment / plan:  CSW discussed the plan for short term rehab. CSW explained her role in the process. CSW obtained permission to send out offers to [facility]. FL2 pending PT/OT. Passr obtained. Offer will be sent at that time.   Employment status:  Psychologist, counselling:  Medicare PT Recommendations:  [place] / Referral to community resources:     Patient/Family's Response to care:  Psychologist, prison and probation services of CSW assistance with SNF placement. No issues or concerns at this time.  Patient/Family's Understanding of and Emotional Response to Diagnosis, Current Treatment, and Prognosis:  Patient/Family has good understanding, current treatment and prognosis and is hopeful that short term rehab will address physical  impairment.  Emotional Assessment Appearance:  Appears stated age Attitude/Demeanor/Rapport:   (Cooperative) Affect (typically observed):  Accepting, Appropriate Orientation:  Oriented to Self, Oriented to Place, Oriented to  Time, Oriented to Situation Alcohol / Substance use:  Not Applicable Psych involvement (Current and /or in the community):  No (Comment)  Discharge Needs  Concerns to be addressed:  Care Coordination Readmission within the last 30 days:  No Current discharge risk:  Dependent with Mobility, Physical Impairment Barriers to Discharge:  No Barriers Identified   Normajean Baxter, LCSW 11/20/2016, 12:32 PM

## 2016-11-20 NOTE — Progress Notes (Signed)
repoert given to [employer] as caregiver

## 2016-11-20 NOTE — Anesthesia Postprocedure Evaluation (Signed)
Anesthesia Post Note  Patient: Kimberly [name]  Procedure(s) Performed: Procedure(s) (LRB): LEFT TOTAL KNEE ARTHROPLASTY (Left)     Patient location during evaluation: PACU Anesthesia Type: Spinal Level of consciousness: awake, awake and alert and oriented Pain management: pain level controlled Vital Signs Assessment: post-procedure vital signs reviewed and stable Respiratory status: spontaneous breathing, nonlabored ventilation and respiratory function stable Cardiovascular status: blood pressure returned to baseline Postop Assessment: spinal receding Anesthetic complications: no    Last Vitals:  Vitals:   11/20/16 1147 11/20/16 1438  BP: (!) 141/62 119/71  Pulse: 60 (!) 59  Resp: 16 16  Temp: (!) 36.3 C (!) 36.4 C    Last Pain:  Vitals:   11/20/16 1438  TempSrc: Oral  PainSc:                  [name]

## 2016-11-20 NOTE — Anesthesia Procedure Notes (Signed)
Anesthesia Regional Block: Adductor canal block   Pre-Anesthetic Checklist: ,, timeout performed, Correct Patient, Correct Site, Correct Laterality, Correct Procedure, Correct Position, site marked, Risks and benefits discussed,  Surgical consent,  Pre-op evaluation,  At surgeon's request and post-op pain management  Laterality: Left and Lower  Prep: Maximum Sterile Barrier Precautions used, chloraprep       Needles:  Injection technique: Single-shot  Needle Type: Echogenic Stimulator Needle     Needle Length: 10cm      Additional Needles:   Procedures: ultrasound guided,,,,,,,,  Narrative:  Start time: 11/20/2016 6:51 AM End time: 11/20/2016 7:01 AM Injection made incrementally with aspirations every 5 mL.  Performed by: Personally  Anesthesiologist: Montez Hageman  Additional Notes: Risks, benefits and alternative to block explained extensively.  Patient tolerated procedure well, without complications.

## 2016-11-20 NOTE — Op Note (Signed)
PATIENT ID:      Kimberly [name]  MRN:     [id number] DOB/AGE:    1950/08/26 / 66 y.o.       OPERATIVE REPORT    DATE OF PROCEDURE:  11/20/2016       PREOPERATIVE DIAGNOSIS: END STAGE  LEFT KNEE OSTEOARTHRITIS                                                       Estimated body mass index is 31.76 kg/m as calculated from the following:   Height as of this encounter: 5\' 1"  (1.549 m).   Weight as of this encounter: 168 lb 1.6 oz (76.2 kg).     POSTOPERATIVE DIAGNOSIS:END STAGE   LEFT KNEE OSTEOARTHRITIS                                                                     Estimated body mass index is 31.76 kg/m as calculated from the following:   Height as of this encounter: 5\' 1"  (1.549 m).   Weight as of this encounter: 168 lb 1.6 oz (76.2 kg).     PROCEDURE:  Procedure(s): LEFT TOTAL KNEE ARTHROPLASTY      SURGEON:  [name], MD    ASSISTANT:   Biagio Borg, PA-C   (Present and scrubbed throughout the case, critical for assistance with exposure, retraction, instrumentation, and closure.)          ANESTHESIA: regional, spinal and IV sedation     DRAINS: HEMOVAC DRAIN LEFT KNEE CLAMPED     TOURNIQUET TIME:  Total Tourniquet Time Documented: area (laterality) - 69 minutes Total: area (laterality) - 69 minutes     COMPLICATIONS:  None   CONDITION:  stable  PROCEDURE IN DETAIL: [place] 11/20/2016, 9:08 AM

## 2016-11-21 ENCOUNTER — Encounter (HOSPITAL_COMMUNITY): Payer: Self-pay | Admitting: Orthopaedic Surgery

## 2016-11-21 LAB — CBC
HEMATOCRIT: 32.6 % — AB (ref 36.0–46.0)
Hemoglobin: 10.6 g/dL — ABNORMAL LOW (ref 12.0–15.0)
MCH: 29.9 pg (ref 26.0–34.0)
MCHC: 32.5 g/dL (ref 30.0–36.0)
MCV: 91.8 fL (ref 78.0–100.0)
Platelets: 231 10*3/uL (ref 150–400)
RBC: 3.55 MIL/uL — ABNORMAL LOW (ref 3.87–5.11)
RDW: 13.5 % (ref 11.5–15.5)
WBC: 6.7 10*3/uL (ref 4.0–10.5)

## 2016-11-21 LAB — BASIC METABOLIC PANEL
Anion gap: 5 (ref 5–15)
BUN: 8 mg/dL (ref 6–20)
CALCIUM: 8.8 mg/dL — AB (ref 8.9–10.3)
CHLORIDE: 105 mmol/L (ref 101–111)
CO2: 25 mmol/L (ref 22–32)
CREATININE: 0.65 mg/dL (ref 0.44–1.00)
GFR calc Af Amer: >60 mL/min (ref >60)
GFR calc non Af Amer: >60 mL/min (ref >60)
GLUCOSE: 122 mg/dL — AB (ref 65–99)
Potassium: 4.1 mmol/L (ref 3.5–5.1)
Sodium: 135 mmol/L (ref 135–145)

## 2016-11-21 NOTE — Progress Notes (Signed)
PATIENT ID: Kimberly [name]        MRN:  [id number]          DOB/AGE: 06/21/1950 / 66 y.o.    Kimberly Fears, MD   Kimberly Borg, PA-C [address] [place], [place]  [address]                             [phone]   PROGRESS NOTE  Subjective:  negative for Chest Pain  negative for Shortness of Breath  negative for Nausea/Vomiting   negative for Calf Pain    Tolerating Diet: yes         Patient reports pain as mild and moderate.     Feels good, pain controlled  Objective: Vital signs in last 24 hours:   Patient Vitals for the past 24 hrs:  BP Temp Temp src Pulse Resp SpO2  11/21/16 0413 (!) 104/57 97.6 F (36.4 C) Oral 68 18 98 %  11/21/16 0028 (!) 113/48 98.4 F (36.9 C) Oral 63 18 97 %  11/20/16 1900 129/65 98.6 F (37 C) Oral (!) 59 17 100 %  11/20/16 1438 119/71 (!) 97.5 F (36.4 C) Oral (!) 59 16 98 %  11/20/16 1147 (!) 141/62 (!) 97.3 F (36.3 C) Oral 60 16 100 %  11/20/16 1130 - (!) 97.2 F (36.2 C) - (!) 53 16 100 %  11/20/16 1115 - - - (!) 51 14 100 %  11/20/16 1100 - - - 60 16 100 %  11/20/16 1054 116/72 - - - - -  11/20/16 1045 - - - (!) 57 16 99 %  11/20/16 1039 126/71 - - (!) 51 13 100 %  11/20/16 1030 - - - (!) 50 12 100 %  11/20/16 1014 - - - (!) 58 14 100 %  11/20/16 1000 (!) 116/96 - - (!) 54 12 100 %  11/20/16 0945 (!) 114/58 - - (!) 58 17 100 %  11/20/16 0937 (!) 99/56 (!) 96.8 F (36 C) - 61 12 99 %      Intake/Output from previous day:   07/10 0701 - 07/11 0700 In: 2680 [P.O.:460; I.V.:2220] Out: 2130 [Urine:3050; Drains:150]   Intake/Output this shift:   No intake/output data recorded.   Intake/Output      07/10 0701 - 07/11 0700 07/11 0701 - 07/12 0700   P.O. 460    I.V. (mL/kg) 2220 (29.1)    Total Intake(mL/kg) 2680 (35.2)    Urine (mL/kg/hr) 3050 (1.7)    Drains 150 (0.1)    Blood 30 (0)    Total Output 3230     Net -550             LABORATORY DATA:  Recent Labs  11/21/16 0616  WBC 6.7  HGB 10.6*  HCT 32.6*    PLT 231    Recent Labs  11/21/16 0616  NA 135  K 4.1  CL 105  CO2 25  BUN 8  CREATININE 0.65  GLUCOSE 122*  CALCIUM 8.8*   Lab Results  Component Value Date   INR 1.07 11/08/2016   INR 1.06 09/02/2014   INR 1.01 10/07/2012    Recent Radiographic Studies :  Dg Chest 2 View  Result Date: 11/08/2016 CLINICAL DATA:  Preop evaluation for upcoming knee surgery EXAM: CHEST  2 VIEW COMPARISON:  09/02/2014 FINDINGS: Cardiac shadow is at the upper limits of normal in size. The lungs are clear bilaterally. No acute  bony abnormality is seen. IMPRESSION: No acute abnormality noted. Electronically Signed   By: Inez Catalina M.D.   On: 11/08/2016 16:50     Examination:  General appearance: alert, mild distress and moderate distress Resp: clear to auscultation bilaterally Cardio: regular rate and rhythm GI: normal findings: bowel sounds normal  Wound Exam: clean, dry, intact or draining   Drainage:  Scant/small amount Bloody exudate  Motor Exam: EHL, FHL, Anterior Tibial and Posterior Tibial Intact  Sensory Exam: Superficial Peroneal, Deep Peroneal and Tibial normal  Vascular Exam: Left dorsalis pedis artery has 1+ (weak) pulse  Assessment:    1 Day Post-Op  Procedure(s) (LRB): LEFT TOTAL KNEE ARTHROPLASTY (Left)  ADDITIONAL DIAGNOSIS:  Active Problems:   S/P total knee replacement using cement, left     Plan: Physical Therapy as ordered Partial Weight Bearing @ 50% (PWB)  DVT Prophylaxis:  Xarelto, Foot Pumps and TED hose  DISCHARGE PLAN: Skilled Nursing Facility/Rehab  DISCHARGE NEEDS: HHPT, CPM, Walker and 3-in-1 comode seat        Kimberly [name]  11/21/2016 7:41 AM  Patient ID: Kimberly [name], female   DOB: Jan 11, 1951, 66 y.o.   MRN: [id number]

## 2016-11-21 NOTE — Evaluation (Signed)
Occupational Therapy Evaluation Patient Details Name: Kimberly [name] MRN: [id number] DOB: August 23, 1950 Today's Date: 11/21/2016    History of Present Illness Pt is a 66 y/o female s/p L TKA secondary to L knee OA. PMH includes DVT, metabolic syndrome, HTN, osteopenia and R TKA.    Clinical Impression   PTA, pt was living with her husband and was independent. Currently, pt requires Min A for LB ADLs and functional mobility using RW. Provided education on PWB status, LB ADLs, and toilet transfer with 3N1; pt demonstrated and verbalized understanding. Pt with difficulty maintaining WB status during functional mobility with RW, so educated pt on SPT to [facility] for toileting. Pt demonstrating good adherence to WB during SPT. Feel pt would benefit form further acute OT to facilitate safe dc. Recommend dc to SNF for further OT to optimize to safety and independence prior to transitioning home.      Follow Up Recommendations  SNF;Supervision/Assistance - 24 hour    Equipment Recommendations  Other (comment) (Defer to next venue)    Recommendations for Other Services PT consult     Precautions / Restrictions Precautions Precautions: Knee Precaution Booklet Issued: No Precaution Comments: Educated on resting with knee in extension Restrictions Weight Bearing Restrictions: Yes LLE Weight Bearing: Partial weight bearing LLE Partial Weight Bearing Percentage or Pounds: 50%      Mobility Bed Mobility Overal bed mobility: Needs Assistance Bed Mobility: Supine to Sit     Supine to sit: Min guard;HOB elevated     General bed mobility comments: Pt not needing physical A for bed mobility. Pt utilized bed rails and HOB elevated.   Transfers Overall transfer level: Needs assistance Equipment used: Rolling walker (2 wheeled) Transfers: Sit to/from [facility] Sit to Stand: Min assist Stand pivot transfers: Min assist       General transfer comment: Pt demonstrating good  technique. Provided education on WB status and hand placement. Practiced SPT from recliner<>BSC twice    Balance Overall balance assessment: Needs assistance Sitting-balance support: No upper extremity supported;Feet supported Sitting balance-Leahy Scale: Good Sitting balance - Comments: Pt able to lean forward to don sock without LOB   Standing balance support: Bilateral upper extremity supported;During functional activity Standing balance-Leahy Scale: Poor Standing balance comment: Requires UE support                           ADL either performed or assessed with clinical judgement   ADL Overall ADL's : Needs assistance/impaired Eating/Feeding: Set up;Sitting   Grooming: Set up;Sitting   Upper Body Bathing: Set up;Sitting   Lower Body Bathing: Minimal assistance;Sit to/from stand   Upper Body Dressing : Set up;Sitting   Lower Body Dressing: Minimal assistance;Sit to/from stand Lower Body Dressing Details (indicate cue type and reason): Pt donned R sock. Requires Min A for L sock. Reviewed LB dressing techniques. Pt able to verbalize techniques from last TKA Toilet Transfer: Minimal assistance;RW;Cueing for sequencing;BSC;Stand-pivot Toilet Transfer Details (indicate cue type and reason): pt performed stand pivot x2 with Min A and RW demonstrating good technique and WB adherance. Pt wanting to practice so she felt safe to perform later in the day.          Functional mobility during ADLs: Minimal assistance;Rolling walker (Unable to maintain WB during fucntional mobility to recliner) General ADL Comments: Pt demonstrating good functional performance and sitting balance. Pt unabel to maintain WB status during fucntional mobility to recliner, so educated her on SPT to  increase safety and adherance to PWB. Pt will good understanding of education and very motivated to participate in therapy.      Vision         Perception     Praxis      Pertinent Vitals/Pain  Pain Assessment: 0-10 Pain Score: 2  Pain Location: L knee  Pain Descriptors / Indicators: Aching;Operative site guarding;Sore Pain Intervention(s): Monitored during session;Repositioned;Ice applied     Hand Dominance Right   Extremity/Trunk Assessment Upper Extremity Assessment Upper Extremity Assessment: Overall WFL for tasks assessed   Lower Extremity Assessment Lower Extremity Assessment: Defer to PT evaluation   Cervical / Trunk Assessment Cervical / Trunk Assessment: Normal   Communication Communication Communication: No difficulties   Cognition Arousal/Alertness: Awake/alert Behavior During Therapy: WFL for tasks assessed/performed Overall Cognitive Status: Within Functional Limits for tasks assessed                                     General Comments  Pt very motivated to participate in therapy. VSS throughout    Exercises     Shoulder Instructions      Home Living Family/patient expects to be discharged to:: Skilled nursing facility Living Arrangements: Spouse/significant other Available Help at Discharge: Family;Available PRN/intermittently (Husband has conference till Mid-July and can't be 24/7) Type of Home: House Home Access: Stairs to enter [employer] of Steps: 3   Home Layout: Two level;Able to live on main level with bedroom/bathroom                   Additional Comments: [facility]       Prior Functioning/Environment Level of Independence: Independent        Comments: ADLs, IADLs, driving        OT Problem List: Impaired balance (sitting and/or standing);Decreased knowledge of use of DME or AE;Decreased knowledge of precautions;Pain      OT Treatment/Interventions: Self-care/ADL training;Therapeutic exercise;DME and/or AE instruction;Energy conservation;Patient/family education;Therapeutic activities    OT Goals(Current goals can be found in the care plan section) Acute Rehab OT Goals Patient Stated  Goal: to go to rehab before home to get stronger  OT Goal Formulation: With patient Time For Goal Achievement: 12/05/16 Potential to Achieve Goals: Good  OT Frequency: Min 2X/week   Barriers to D/C: Decreased caregiver support  Husband not able to provided intial 24 hour support till Min july due to work       Co-evaluation              AM-PAC PT "6 Clicks" Daily Activity     Outcome Measure Help from another person eating meals?: None Help from another person taking care of personal grooming?: A Little Help from another person toileting, which includes using toliet, bedpan, or urinal?: A Little Help from another person bathing (including washing, rinsing, drying)?: A Little Help from another person to put on and taking off regular upper body clothing?: None Help from another person to put on and taking off regular lower body clothing?: A Little 6 Click Score: 20   End of Session Equipment Utilized During Treatment: Gait belt;Rolling walker CPM Left Knee CPM Left Knee: Off Nurse Communication: Mobility status;Other (comment);Precautions;Weight bearing status (Wanting cath removed)  Activity Tolerance: Patient tolerated treatment well Patient left: in chair;with call bell/phone within reach  OT Visit Diagnosis: Unsteadiness on feet (R26.81);Other abnormalities of gait and mobility (R26.89);Pain Pain - Right/Left: Left Pain -  part of body: Knee                Time: 3692-2300 OT Time Calculation (min): 28 min Charges:  OT General Charges $OT Visit: 1 Procedure OT Evaluation $OT Eval Low Complexity: 1 Procedure OT Treatments $Self Care/Home Management : 8-22 mins G-Codes:     Ferdie Bakken MSOT, OTR/L Acute Rehab Pager: [phone] Office: [place] 11/21/2016, 10:35 AM

## 2016-11-21 NOTE — Progress Notes (Signed)
Physical Therapy Treatment Patient Details Name: Kimberly [name] MRN: [id number] DOB: 06-09-1950 Today's Date: 11/21/2016    History of Present Illness Pt is a 66 y/o female s/p L TKA secondary to L knee OA. PMH includes DVT, metabolic syndrome, HTN, osteopenia and R TKA.     PT Comments    Pt able to begin gait training today with cues for maintaining L LE PWB.  Pt should progress well. Plan is for short term SNF.  Follow Up Recommendations  SNF     Equipment Recommendations  None recommended by PT    Recommendations for Other Services       Precautions / Restrictions Precautions Precautions: Knee Precaution Booklet Issued: Yes (comment) Precaution Comments: Educated on resting with knee in extension Restrictions Weight Bearing Restrictions: Yes LLE Weight Bearing: Partial weight bearing LLE Partial Weight Bearing Percentage or Pounds: 50%    Mobility  Bed Mobility Overal bed mobility: Needs Assistance Bed Mobility: Supine to Sit     Supine to sit: Min guard;HOB elevated     General bed mobility comments: Pt up in bed upon arrival  Transfers Overall transfer level: Needs assistance Equipment used: Rolling walker (2 wheeled) Transfers: Sit to/from Stand Sit to Stand: Min guard Stand pivot transfers: Min assist       General transfer comment: cues for proper technique  Ambulation/Gait Ambulation/Gait assistance: Min guard Ambulation Distance (Feet): 45 Feet Assistive device: Rolling walker (2 wheeled) Gait Pattern/deviations: Decreased stance time - left;Decreased step length - right Gait velocity: decreased Gait velocity interpretation: Below normal speed for age/gender General Gait Details: cues for maintaining 50% PWB on L LE.  Pt also needed cues on turning prior to getting to chair rather than walking and standing straight in front and then turning   Stairs            Wheelchair Mobility    Modified Rankin (Stroke Patients Only)        Balance Overall balance assessment: Needs assistance Sitting-balance support: No upper extremity supported;Feet supported Sitting balance-Leahy Scale: Good Sitting balance - Comments: Pt able to lean forward to don sock without LOB   Standing balance support: Bilateral upper extremity supported Standing balance-Leahy Scale: Poor Standing balance comment: Requires UE support                            Cognition Arousal/Alertness: Awake/alert Behavior During Therapy: WFL for tasks assessed/performed Overall Cognitive Status: Within Functional Limits for tasks assessed                                        Exercises Total Joint Exercises Ankle Circles/Pumps: AROM;Both;10 reps Quad Sets: AROM;Left;10 reps Towel Squeeze: AROM;Both;10 reps Short Arc Quad: AROM;Left;10 reps Heel Slides: AAROM;AROM;Left;10 reps Hip ABduction/ADduction: AROM;Left;10 reps Straight Leg Raises: AROM;AAROM;Left;10 reps Goniometric ROM: grossly -10-80 degrees    General Comments General comments (skin integrity, edema, etc.): Pt very motivated to participate in therapy. VSS throughout      Pertinent Vitals/Pain Pain Assessment: 0-10 Pain Score: 4  Pain Location: L knee  Pain Descriptors / Indicators: Operative site guarding;Sore Pain Intervention(s): Limited activity within patient's tolerance;Monitored during session;Premedicated before session;Repositioned;Ice applied    Home Living Family/patient expects to be discharged to:: Skilled nursing facility Living Arrangements: Spouse/significant other Available Help at Discharge: Family;Available PRN/intermittently (Husband has conference till Mid-July and can't be 24/7) Type  of Home: House Home Access: Stairs to enter   Home Layout: Two level;Able to live on main level with bedroom/bathroom   Additional Comments: [facility]     Prior Function Level of Independence: Independent      Comments: ADLs, IADLs, driving    PT Goals (current goals can now be found in the care plan section) Acute Rehab PT Goals Patient Stated Goal: to go to rehab before home to get stronger  PT Goal Formulation: With patient Time For Goal Achievement: 11/27/16 Potential to Achieve Goals: Good Progress towards PT goals: Progressing toward goals    Frequency    7X/week      PT Plan Current plan remains appropriate    Co-evaluation              AM-PAC PT "6 Clicks" Daily Activity  Outcome Measure  Difficulty turning over in bed (including adjusting bedclothes, sheets and blankets)?: Total Difficulty moving from lying on back to sitting on the side of the bed? : Total Difficulty sitting down on and standing up from a chair with arms (e.g., wheelchair, bedside commode, etc,.)?: A Little Help needed moving to and from a bed to chair (including a wheelchair)?: A Little Help needed walking in hospital room?: A Little Help needed climbing 3-5 steps with a railing? : A Little 6 Click Score: 14    End of Session Equipment Utilized During Treatment: Gait belt Activity Tolerance: Patient tolerated treatment well Patient left: in chair;with call bell/phone within reach Nurse Communication: Mobility status PT Visit Diagnosis: Other abnormalities of gait and mobility (R26.89);Pain Pain - Right/Left: Left Pain - part of body: Knee     Time: 5397-6734 PT Time Calculation (min) (ACUTE ONLY): 30 min  Charges:  $Gait Training: 8-22 mins $Therapeutic Exercise: 8-22 mins                    G Codes:       Kimberly [name], [place] Pager [phone] 11/21/2016    [name] 11/21/2016, 11:37 AM

## 2016-11-21 NOTE — Discharge Instructions (Signed)

## 2016-11-21 NOTE — Plan of Care (Signed)
Problem: Safety: Goal: Ability to remain free from injury will improve Outcome: Progressing Bed in low position, non skid socks on, call light and belongings in reach. Pt calls appropriately for assistance.

## 2016-11-21 NOTE — Progress Notes (Signed)
Nurse went to room and informed patient foley has to be removed this morning . Patient and husband informed nurse they do not want foley taken out until PT sees patient and determine if it appropriate for patient to walk to the bathroom before foley is taken out.

## 2016-11-22 LAB — CBC
HEMATOCRIT: 31.5 % — AB (ref 36.0–46.0)
HEMOGLOBIN: 10.3 g/dL — AB (ref 12.0–15.0)
MCH: 29.8 pg (ref 26.0–34.0)
MCHC: 32.7 g/dL (ref 30.0–36.0)
MCV: 91 fL (ref 78.0–100.0)
Platelets: 231 10*3/uL (ref 150–400)
RBC: 3.46 MIL/uL — ABNORMAL LOW (ref 3.87–5.11)
RDW: 13.3 % (ref 11.5–15.5)
WBC: 8.7 10*3/uL (ref 4.0–10.5)

## 2016-11-22 LAB — TYPE AND SCREEN
ABO/RH(D): AB POS
Antibody Screen: POSITIVE
DAT, IgG: NEGATIVE
Unit division: 0
Unit division: 0

## 2016-11-22 LAB — BPAM RBC
BLOOD PRODUCT EXPIRATION DATE: 201807142359
BLOOD PRODUCT EXPIRATION DATE: 201807142359
UNIT TYPE AND RH: 8400
UNIT TYPE AND RH: 8400

## 2016-11-22 LAB — BASIC METABOLIC PANEL
ANION GAP: 4 — AB (ref 5–15)
BUN: 8 mg/dL (ref 6–20)
CALCIUM: 8.3 mg/dL — AB (ref 8.9–10.3)
CHLORIDE: 102 mmol/L (ref 101–111)
CO2: 25 mmol/L (ref 22–32)
Creatinine, Ser: 0.54 mg/dL (ref 0.44–1.00)
GFR calc Af Amer: >60 mL/min (ref >60)
GFR calc non Af Amer: >60 mL/min (ref >60)
GLUCOSE: 164 mg/dL — AB (ref 65–99)
Potassium: 3.9 mmol/L (ref 3.5–5.1)
Sodium: 131 mmol/L — ABNORMAL LOW (ref 135–145)

## 2016-11-22 MED ORDER — OXYCODONE HCL 5 MG PO TABS: 5.0000 mg | ORAL_TABLET | ORAL | 0 refills | Status: DC | PRN

## 2016-11-22 MED ORDER — PANTOPRAZOLE SODIUM 20 MG PO TBEC
20.0000 mg | DELAYED_RELEASE_TABLET | Freq: Every day | ORAL | Status: DC
  Administered 2016-11-22 – 2016-11-23 (×2): 20 mg via ORAL
  Filled 2016-11-22 (×2): qty 1

## 2016-11-22 MED ORDER — RIVAROXABAN 10 MG PO TABS: 10.0000 mg | ORAL_TABLET | Freq: Every day | ORAL | 0 refills | Status: DC

## 2016-11-22 MED ORDER — METHOCARBAMOL 500 MG PO TABS: 500.0000 mg | ORAL_TABLET | Freq: Four times a day (QID) | ORAL | 0 refills | Status: DC | PRN

## 2016-11-22 NOTE — NC FL2 (Signed)
[place] LEVEL OF CARE SCREENING TOOL     IDENTIFICATION  Patient Name: Kimberly [name] Birthdate: January 09, 1951 Sex: female Admission Date (Current Location): 11/20/2016  [facility] and [place] Number:  Herbalist and Address:  [facility] [place] [address], [place], [place] [address]      Provider Number: [id number]  Attending Physician Name and Address:  Garald Balding, MD  Relative Name and Phone Number:  Mr. [place], spouse    Current Level of Care: Hospital Recommended Level of Care: [place] Prior Approval Number:    Date Approved/Denied: 11/21/16 PASRR Number: [id number]  Discharge Plan: SNF    Current Diagnoses: Patient Active Problem List   Diagnosis Date Noted  . S/P total knee replacement using cement, left 11/20/2016  . Osteopenia 02/28/2015  . Left lumbar radiculitis 02/28/2015  . Acute blood loss anemia 09/20/2014  . Primary osteoarthritis of right knee 09/14/2014  . Osteoarthritis of left knee 09/14/2014  . Vitamin B 12 deficiency 07/29/2014  . Obesity (BMI 30-39.9) 12/08/2013  . Osteoarthritis, knee 05/30/2012  . Metabolic syndrome 71/10/2692  . DVT, HX OF 02/10/2010  . Vitamin D deficiency 08/25/2007  . Disorder of bone and cartilage 04/08/2007  . Hypothyroidism 11/18/2006  . ANEMIA-NOS 11/18/2006  . Essential hypertension 11/18/2006  . GERD 11/18/2006    Orientation RESPIRATION BLADDER Height & Weight     Self, Time, Situation, Place  Normal Continent Weight: 168 lb 1.6 oz (76.2 kg) Height:  5\' 1"  (154.9 cm)  BEHAVIORAL SYMPTOMS/MOOD NEUROLOGICAL BOWEL NUTRITION STATUS      Continent Diet (See DC Summary)  AMBULATORY STATUS COMMUNICATION OF NEEDS Skin   Limited Assist Verbally Surgical wounds (Closed Left Leg Incision, Compression Wrap)                       Personal Care Assistance Level of Assistance  Bathing, Feeding, Dressing Bathing Assistance: Limited  assistance Feeding assistance: Independent Dressing Assistance: Limited assistance     Functional Limitations Info             SPECIAL CARE FACTORS FREQUENCY  PT (By licensed PT), OT (By licensed OT)     PT Frequency: 7xweek OT Frequency: 2xweek            Contractures      Additional Factors Info  Code Status, Allergies Code Status Info: Full Code Allergies Info: ENOXAPARIN SODIUM            Current Medications (11/22/2016):  This is the current hospital active medication list Current Facility-Administered Medications  Medication Dose Route Frequency Provider Last Rate Last Dose  . alum & mag hydroxide-simeth (MAALOX/MYLANTA) 200-200-20 MG/5ML suspension 30 mL  30 mL Oral Q4H PRN Biagio Borg D, PA-C   30 mL at 11/22/16 1441  . bisacodyl (DULCOLAX) suppository 10 mg  10 mg Rectal Daily PRN Biagio Borg D, PA-C      . calcium carbonate (OS-CAL - dosed in mg of elemental calcium) tablet 625 mg  625 mg Oral Daily Cherylann Ratel, PA-C   625 mg at 11/22/16 0811  . cholecalciferol (VITAMIN D) tablet 1,000 Units  1,000 Units Oral Daily Cherylann Ratel, PA-C   1,000 Units at 11/22/16 8546  . diphenhydrAMINE (BENADRYL) 12.5 MG/5ML elixir 12.5-25 mg  12.5-25 mg Oral Q4H PRN Biagio Borg D, PA-C      . docusate sodium (COLACE) capsule 100 mg  100 mg Oral BID Cherylann Ratel, PA-C  100 mg at 11/22/16 0812  . gabapentin (NEURONTIN) capsule 100 mg  100 mg Oral TID Cherylann Ratel, PA-C   100 mg at 11/22/16 5102  . HYDROmorphone (DILAUDID) injection 0.5 mg  0.5 mg Intravenous Q2H PRN Cherylann Ratel, PA-C   0.5 mg at 11/21/16 2138  . irbesartan (AVAPRO) tablet 150 mg  150 mg Oral QPC breakfast Cherylann Ratel, PA-C   150 mg at 11/22/16 5852  . levothyroxine (SYNTHROID, LEVOTHROID) tablet 50 mcg  50 mcg Oral QAC breakfast Cherylann Ratel, PA-C   50 mcg at 11/21/16 1020  . magnesium citrate solution 1 Bottle  1 Bottle Oral Once PRN Petrarca, Mike Craze, PA-C      .  menthol-cetylpyridinium (CEPACOL) lozenge 3 mg  1 lozenge Oral PRN Biagio Borg D, PA-C       Or  . phenol (CHLORASEPTIC) mouth spray 1 spray  1 spray Mouth/Throat PRN Petrarca, Mike Craze, PA-C      . methocarbamol (ROBAXIN) tablet 500 mg  500 mg Oral Q6H PRN Cherylann Ratel, PA-C   500 mg at 11/22/16 0526   Or  . methocarbamol (ROBAXIN) 500 mg in dextrose 5 % 50 mL IVPB  500 mg Intravenous Q6H PRN Petrarca, Brian D, PA-C      . metoCLOPramide (REGLAN) tablet 5-10 mg  5-10 mg Oral Q8H PRN Biagio Borg D, PA-C       Or  . metoCLOPramide (REGLAN) injection 5-10 mg  5-10 mg Intravenous Q8H PRN Petrarca, Brian D, PA-C      . ondansetron (ZOFRAN) tablet 4 mg  4 mg Oral Q6H PRN Biagio Borg D, PA-C       Or  . ondansetron (ZOFRAN) injection 4 mg  4 mg Intravenous Q6H PRN Biagio Borg D, PA-C      . oxyCODONE (Oxy IR/ROXICODONE) immediate release tablet 5-10 mg  5-10 mg Oral Q3H PRN Biagio Borg D, PA-C   10 mg at 11/22/16 1441  . polyethylene glycol (MIRALAX / GLYCOLAX) packet 17 g  17 g Oral Daily PRN Petrarca, Brian D, PA-C      . rivaroxaban (XARELTO) tablet 10 mg  10 mg Oral Q breakfast Cherylann Ratel, PA-C   10 mg at 11/22/16 7782     Discharge Medications: Please see discharge summary for a list of discharge medications.  Relevant Imaging Results:  Relevant Lab Results:   Additional Information SS: [place], LCSW

## 2016-11-22 NOTE — Progress Notes (Signed)
Subjective: 2 Days Post-Op Procedure(s) (LRB): LEFT TOTAL KNEE ARTHROPLASTY (Left) Patient reports pain as mild and moderate.    Objective: Vital signs in last 24 hours: Temp:  [98.8 F (37.1 C)-99.6 F (37.6 C)] 99.1 F (37.3 C) (07/12 1409) Pulse Rate:  [80-83] 83 (07/12 1409) Resp:  [18-20] 20 (07/12 1409) BP: (138-157)/(57-65) 138/57 (07/12 1409) SpO2:  [98 %-100 %] 100 % (07/12 1409)  Intake/Output from previous day: 07/11 0701 - 07/12 0700 In: 480 [P.O.:480] Out: 850 [Urine:850] Intake/Output this shift: No intake/output data recorded.   Recent Labs  11/21/16 0616 11/22/16 0629  HGB 10.6* 10.3*    Recent Labs  11/21/16 0616 11/22/16 0629  WBC 6.7 8.7  RBC 3.55* 3.46*  HCT 32.6* 31.5*  PLT 231 231    Recent Labs  11/21/16 0616 11/22/16 0629  NA 135 131*  K 4.1 3.9  CL 105 102  CO2 25 25  BUN 8 8  CREATININE 0.65 0.54  GLUCOSE 122* 164*  CALCIUM 8.8* 8.3*   No results for input(s): LABPT, INR in the last 72 hours.  Neurovascular intact Sensation intact distally Dorsiflexion/Plantar flexion intact Incision: no drainage  Assessment/Plan: 2 Days Post-Op Procedure(s) (LRB): LEFT TOTAL KNEE ARTHROPLASTY (Left) Advance diet Up with therapy Plan for discharge tomorrow Discharge to SNF  [facility] 11/22/2016, 3:06 PM

## 2016-11-22 NOTE — Progress Notes (Signed)
Offered Pt a bath. Pt stated she would like to wait until later.

## 2016-11-22 NOTE — Discharge Summary (Signed)
[name], MD   Biagio Borg, PA-C [address], [place], [place]  [address]                             [phone]  PATIENT ID: Kimberly [name]        MRN:  [id number]          DOB/AGE: 08-12-1950 / 66 y.o.    DISCHARGE SUMMARY  ADMISSION DATE:    11/20/2016 DISCHARGE DATE:   11/23/2016   ADMISSION DIAGNOSIS: LEFT KNEE OSTEOARTHRITIS    DISCHARGE DIAGNOSIS:  LEFT KNEE OSTEOARTHRITIS    ADDITIONAL DIAGNOSIS: Active Problems:   S/P total knee replacement using cement, left  Past Medical History:  Diagnosis Date  . Anemia 2005   hb 11.5  . Arthritis   . DVT (deep venous thrombosis) ([place])    2003, coumadin x 6 mon  . GERD (gastroesophageal reflux disease) 2001   no problem now  . Hypertension   . Hypothyroidism    off meds  . Neuromuscular disorder ([place])    painful in L foot, being started on Gabapentin- 11/08/2016  . Osteopenia   . Vegetarian diet    eats fish and eggs; no meat    PROCEDURE: Procedure(s): LEFT TOTAL KNEE ARTHROPLASTY  on 11/20/2016  CONSULTS: none    HISTORY: Patient is a 66 y.o. female presented with a history of pain in the left knee for 5 years. Onset of symptoms was gradual starting 5 years ago with gradually worsening course since that time. Prior procedures on the knee are NONE. Patient has been treated conservatively with over-the-counter NSAIDs and activity modification. Patient currently rates pain in the knee at 8 out of 10 with activity. There is pain at night. present.   HOSPITAL COURSE:  Kimberly [name] is a 66 y.o. admitted on 11/20/2016 and found to have a diagnosis of LEFT KNEE OSTEOARTHRITIS.  After appropriate laboratory studies were obtained  they were taken to the operating room on 11/20/2016 and underwent  Procedure(s): LEFT TOTAL KNEE ARTHROPLASTY  .   They were given perioperative antibiotics:  Anti-infectives    Start     Dose/Rate Route Frequency Ordered Stop   11/20/16 1330  ceFAZolin (ANCEF) IVPB 2g/100 mL premix     2  g 200 mL/hr over 30 Minutes Intravenous Every 6 hours 11/20/16 1148 11/20/16 2133   11/20/16 0529  ceFAZolin (ANCEF) IVPB 2g/100 mL premix     2 g 200 mL/hr over 30 Minutes Intravenous On call to O.R. 11/20/16 7782 11/20/16 0748    .  Tolerated the procedure well.  Placed with a foley intraoperatively.     Toradol was given post op.  POD #1, allowed out of bed to a chair.  PT for ambulation and exercise program.  Foley D/C'd in morning.  IV saline locked.  O2 discontionued. Hemovac pulled.  POD #2, continued PT and ambulation.     POD #3, continued PT  The remainder of the hospital course was dedicated to ambulation and strengthening.   The patient was discharged on POD #3 in  Stable condition.  Blood products given:none  DIAGNOSTIC STUDIES: Recent vital signs:  Patient Vitals for the past 24 hrs:  BP Temp Temp src Pulse Resp SpO2  11/23/16 0424 125/66 98.9 F (37.2 C) Oral 86 18 99 %  11/22/16 2101 (!) 146/70 98.9 F (37.2 C) Oral 82 18 98 %  11/22/16 1409 (!) 138/57 99.1 F (37.3 C)  Oral 83 20 100 %       Recent laboratory studies:  Recent Labs  11/21/16 0616 11/22/16 0629 11/23/16 0309  WBC 6.7 8.7 8.4  HGB 10.6* 10.3* 9.9*  HCT 32.6* 31.5* 30.4*  PLT 231 231 224    Recent Labs  11/21/16 0616 11/22/16 0629 11/23/16 0309  NA 135 131* 135  K 4.1 3.9 3.4*  CL 105 102 103  CO2 25 25 26   BUN 8 8 5*  CREATININE 0.65 0.54 0.47  GLUCOSE 122* 164* 159*  CALCIUM 8.8* 8.3* 8.5*   Lab Results  Component Value Date   INR 1.07 11/08/2016   INR 1.06 09/02/2014   INR 1.01 10/07/2012     Recent Radiographic Studies :  Dg Chest 2 View  Result Date: 11/08/2016 CLINICAL DATA:  Preop evaluation for upcoming knee surgery EXAM: CHEST  2 VIEW COMPARISON:  09/02/2014 FINDINGS: Cardiac shadow is at the upper limits of normal in size. The lungs are clear bilaterally. No acute bony abnormality is seen. IMPRESSION: No acute abnormality noted. Electronically Signed   By: Inez Catalina M.D.   On: 11/08/2016 16:50    DISCHARGE INSTRUCTIONS: Discharge Instructions    CPM    Complete by:  As directed    Continuous passive motion machine (CPM):      Use the CPM from 0 to 60 degrees for 6-8 hours per day.      You may increase by 5-10 per day.  You may break it up into 2 or 3 sessions per day.      Use CPM for 3-4 weeks or until you are told to stop.   Call MD / Call 911    Complete by:  As directed    If you experience chest pain or shortness of breath, CALL 911 and be transported to the hospital emergency room.  If you develope a fever above 101 F, pus (white drainage) or increased drainage or redness at the wound, or calf pain, call your surgeon's office.   Change dressing    Complete by:  As directed    DO NOT CHANGE YOUR DRESSING.   Constipation Prevention    Complete by:  As directed    Drink plenty of fluids.  Prune juice may be helpful.  You may use a stool softener, such as Colace (over the counter) 100 mg twice a day.  Use MiraLax (over the counter) for constipation as needed.   Diet general    Complete by:  As directed    Discharge instructions    Complete by:  As directed    Barry items at home which could result in a fall. This includes throw rugs or furniture in walking pathways ICE to the affected joint every three hours while awake for 30 minutes at a time, for at least the first 3-5 days, and then as needed for pain and swelling.  Continue to use ice for pain and swelling. You may notice swelling that will progress down to the foot and ankle.  This is normal after surgery.  Elevate your leg when you are not up walking on it.   Continue to use the breathing machine you got in the hospital (incentive spirometer) which will help keep your temperature down.  It is common for your temperature to cycle up and down following surgery, especially at night when you are not up moving around and exerting yourself.  The  breathing machine keeps your  lungs expanded and your temperature down.   DIET:  As you were doing prior to hospitalization, we recommend a well-balanced diet.  DRESSING / WOUND CARE / SHOWERING  Keep the surgical dressing until follow up.  The dressing is water proof, so you can shower without any extra covering.  IF THE DRESSING FALLS OFF or the wound gets wet inside, change the dressing with sterile gauze.  Please use good hand washing techniques before changing the dressing.  Do not use any lotions or creams on the incision until instructed by your surgeon.    ACTIVITY  Increase activity slowly as tolerated, but follow the weight bearing instructions below.   No driving for 6 weeks or until further direction given by your physician.  You cannot drive while taking narcotics.  No lifting or carrying greater than 10 lbs. until further directed by your surgeon. Avoid periods of inactivity such as sitting longer than an hour when not asleep. This helps prevent blood clots.  You may return to work once you are authorized by your doctor.     WEIGHT BEARING   Partial weight bearing with assist device as directed.  50%   EXERCISES  Results after joint replacement surgery are often greatly improved when you follow the exercise, range of motion and muscle strengthening exercises prescribed by your doctor. Safety measures are also important to protect the joint from further injury. Any time any of these exercises cause you to have increased pain or swelling, decrease what you are doing until you are comfortable again and then slowly increase them. If you have problems or questions, call your caregiver or physical therapist for advice.   Rehabilitation is important following a joint replacement. After just a few days of immobilization, the muscles of the leg can become weakened and shrink (atrophy).  These exercises are designed to build up the tone and strength of the thigh and leg muscles and to  improve motion. Often times heat used for twenty to thirty minutes before working out will loosen up your tissues and help with improving the range of motion but do not use heat for the first two weeks following surgery (sometimes heat can increase post-operative swelling).   These exercises can be done on a training (exercise) mat,  on a table or on a bed. Use whatever works the best and is most comfortable for you.    Use music or television while you are exercising so that the exercises are a pleasant break in your day. This will make your life better with the exercises acting as a break in your routine that you can look forward to.   Perform all exercises about fifteen times, three times per day or as directed.  You should exercise both the operative leg and the other leg as well.   Exercises include:  Quad Sets - Tighten up the muscle on the front of the thigh (Quad) and hold for 5-10 seconds.   Straight Leg Raises - With your knee straight (if you were given a brace, keep it on), lift the leg to 60 degrees, hold for 3 seconds, and slowly lower the leg.  Perform this exercise against resistance later as your leg gets stronger.  Leg Slides: Lying on your back, slowly slide your foot toward your buttocks, bending your knee up off the floor (only go as far as is comfortable). Then slowly slide your foot back down until your leg is flat on the floor again.  Angel Wings: Lying on  your back spread your legs to the side as far apart as you can without causing discomfort.  Hamstring Strength:  Lying on your back, push your heel against the floor with your leg straight by tightening up the muscles of your buttocks.  Repeat, but this time bend your knee to a comfortable angle, and push your heel against the floor.  You may put a pillow under the heel to make it more comfortable if necessary.   A rehabilitation program following joint replacement surgery can speed recovery and prevent re-injury in the future  due to weakened muscles. Contact your doctor or a physical therapist for more information on knee rehabilitation.    CONSTIPATION  Constipation is defined medically as fewer than three stools per week and severe constipation as less than one stool per week.  Even if you have a regular bowel pattern at home, your normal regimen is likely to be disrupted due to multiple reasons following surgery.  Combination of anesthesia, postoperative narcotics, change in appetite and fluid intake all can affect your bowels.   YOU MUST use at least one of the following options; they are listed in order of increasing strength to get the job done.  They are all available over the counter, and you may need to use some, POSSIBLY even all of these options:    Drink plenty of fluids (prune juice may be helpful) and high fiber foods Colace 100 mg by mouth twice a day  Senokot for constipation as directed and as needed Dulcolax (bisacodyl), take with full glass of water  Miralax (polyethylene glycol) once or twice a day as needed.  If you have tried all these things and are unable to have a bowel movement in the first 3-4 days after surgery call either your surgeon or your primary doctor.    If you experience loose stools or diarrhea, hold the medications until you stool forms back up.  If your symptoms do not get better within 1 week or if they get worse, check with your doctor.  If you experience "the worst abdominal pain ever" or develop nausea or vomiting, please contact the office immediately for further recommendations for treatment.   ITCHING:  If you experience itching with your medications, try taking only a single pain pill, or even half a pain pill at a time.  You can also use Benadryl over the counter for itching or also to help with sleep.   TED HOSE STOCKINGS:  Use stockings on both legs until for at least 2 weeks or as directed by physician office. They may be removed at night for  sleeping.  MEDICATIONS:  See your medication summary on the "After Visit Summary" that nursing will review with you.  You may have some home medications which will be placed on hold until you complete the course of blood thinner medication.  It is important for you to complete the blood thinner medication as prescribed.  PRECAUTIONS:  If you experience chest pain or shortness of breath - call 911 immediately for transfer to the hospital emergency department.   If you develop a fever greater that 101 F, purulent drainage from wound, increased redness or drainage from wound, foul odor from the wound/dressing, or calf pain - CONTACT YOUR SURGEON.  FOLLOW-UP APPOINTMENTS:  If you do not already have a post-op appointment, please call the office for an appointment to be seen by your surgeon.  Guidelines for how soon to be seen are listed in your "After Visit Summary", but are typically between 1-4 weeks after surgery.  OTHER INSTRUCTIONS:   Knee Replacement:  Do not place pillow under knee, focus on keeping the knee straight while resting. CPM instructions: 0-90 degrees, 2 hours in the morning, 2 hours in the afternoon, and 2 hours in the evening. Place foam block, curve side up under heel at all times except when in CPM or when walking.  DO NOT modify, tear, cut, or change the foam block in any way.  MAKE SURE YOU:  Understand these instructions.  Get help right away if you are not doing well or get worse.    Thank you for letting us be a part of your medical care team.  It is a privilege we respect greatly.  We hope these instructions will help you stay on track for a fast and full recovery!   Do not put a pillow under the knee. Place it under the heel.    Complete by:  As directed    Driving restrictions    Complete by:  As directed    No driving for 6 weeks   Increase activity slowly as tolerated    Complete by:  As directed    Lifting  restrictions    Complete by:  As directed    No lifting for 6 weeks   Partial weight bearing    Complete by:  As directed    % Body Weight:  50%   Laterality:  left   Extremity:  Lower   Patient may shower    Complete by:  As directed    You may shower over your dressing   TED hose    Complete by:  As directed    Use stockings (TED hose) for 3 weeks on left  leg.  You may remove them at night for sleeping.     DISCHARGE MEDICATIONS:        FOLLOW UP VISIT:   Follow-up Information    Garald Balding, MD Follow up on 12/03/2016.   Specialty:  Orthopedic Surgery Contact information: [address] [place] [address] [phone]           DISPOSITION:   Skilled Nursing Facility/Rehab  CONDITION:  Stable   Mike Craze. [place], [place] [phone]  11/23/2016 7:39 AM

## 2016-11-22 NOTE — Progress Notes (Signed)
Physical Therapy Treatment Patient Details Name: Kimberly [name] MRN: [id number] DOB: 1950-11-16 Today's Date: 11/22/2016    History of Present Illness Pt is a 66 y/o female s/p L TKA secondary to L knee OA. PMH includes DVT, metabolic syndrome, HTN, osteopenia and R TKA.     PT Comments    Pt is very good following instructions and demonstrating awareness of her limits on L knee.  Her walker technique improving and will need to be supervised for now to ambulate safely, very appropriate to transition to SNF upon dc from hospital.  Her acute therapy will continue to focus on ROM and balance to increase safety with transitions, as she cannot complete transfers to sit in chair in her room without dense verbal and tactile cues.     Follow Up Recommendations  SNF     Equipment Recommendations  None recommended by PT    Recommendations for Other Services       Precautions / Restrictions Precautions Precautions: Knee Precaution Booklet Issued: Yes (comment) Precaution Comments: talked with her about using the leg rest to support L knee Restrictions Weight Bearing Restrictions: Yes LLE Weight Bearing: Partial weight bearing LLE Partial Weight Bearing Percentage or Pounds: 50    Mobility  Bed Mobility Overal bed mobility: Needs Assistance Bed Mobility: Supine to Sit     Supine to sit: Min assist;HOB elevated     General bed mobility comments: supported trunk and pt took her time working out to edge  Transfers Overall transfer level: Needs assistance Equipment used: Rolling walker (2 wheeled) Transfers: Sit to/from Stand   Stand pivot transfers: Clinical research associate transfer comment: cues for proper technique  Ambulation/Gait Ambulation/Gait assistance: Min guard Ambulation Distance (Feet): 55 Feet Assistive device: Rolling walker (2 wheeled) Gait Pattern/deviations: Decreased stance time - left;Decreased weight shift to left;Shuffle;Trunk flexed;Wide base of  support;Antalgic Gait velocity: decreased Gait velocity interpretation: Below normal speed for age/gender General Gait Details: pt maintained PWB with minor cues using RW   Stairs            Wheelchair Mobility    Modified Rankin (Stroke Patients Only)       Balance Overall balance assessment: Needs assistance Sitting-balance support: Feet supported Sitting balance-Leahy Scale: Good     Standing balance support: Bilateral upper extremity supported Standing balance-Leahy Scale: Fair Standing balance comment: UE's on walker but in better control of balance                            Cognition Arousal/Alertness: Awake/alert Behavior During Therapy: WFL for tasks assessed/performed Overall Cognitive Status: Within Functional Limits for tasks assessed                                        Exercises Total Joint Exercises Ankle Circles/Pumps: AROM;Both;5 reps (holding stretches) Quad Sets: AROM;Both;10 reps Gluteal Sets: AROM;Both;10 reps Heel Slides: AAROM;Left;10 reps Hip ABduction/ADduction: AAROM;Both;10 reps    General Comments General comments (skin integrity, edema, etc.): Pt is willing to push to walk more despite a bit of nausea which was better with mobility      Pertinent Vitals/Pain Pain Assessment: Faces Faces Pain Scale: Hurts even more Pain Location: L knee with initial mobility Pain Descriptors / Indicators: Operative site guarding;Sore Pain Intervention(s): Monitored during session;Premedicated before session;Repositioned;Ice applied    Home Living  Prior Function            PT Goals (current goals can now be found in the care plan section) Acute Rehab PT Goals Patient Stated Goal: rehab then home Progress towards PT goals: Progressing toward goals    Frequency    7X/week      PT Plan Current plan remains appropriate    Co-evaluation              AM-PAC PT "6  Clicks" Daily Activity  Outcome Measure  Difficulty turning over in bed (including adjusting bedclothes, sheets and blankets)?: Total Difficulty moving from lying on back to sitting on the side of the bed? : Total Difficulty sitting down on and standing up from a chair with arms (e.g., wheelchair, bedside commode, etc,.)?: A Little Help needed moving to and from a bed to chair (including a wheelchair)?: A Little Help needed walking in hospital room?: A Little Help needed climbing 3-5 steps with a railing? : A Little 6 Click Score: 14    End of Session Equipment Utilized During Treatment: Gait belt Activity Tolerance: Patient tolerated treatment well Patient left: in chair;with call bell/phone within reach Nurse Communication: Mobility status PT Visit Diagnosis: Other abnormalities of gait and mobility (R26.89);Pain Pain - Right/Left: Left Pain - part of body: Knee     Time: 0915-0948 PT Time Calculation (min) (ACUTE ONLY): 33 min  Charges:  $Gait Training: 8-22 mins $Therapeutic Exercise: 8-22 mins                    G Codes:  Functional Assessment Tool Used: AM-PAC 6 Clicks Basic Mobility     Ramond Dial 11/22/2016, 11:09 AM   Mee Hives, PT MS Acute Rehab Dept. Number: [place] [facility] [place] [facility]

## 2016-11-23 ENCOUNTER — Encounter (HOSPITAL_COMMUNITY): Payer: Self-pay

## 2016-11-23 DIAGNOSIS — M170 Bilateral primary osteoarthritis of knee: Secondary | ICD-10-CM | POA: Diagnosis not present

## 2016-11-23 DIAGNOSIS — M1990 Unspecified osteoarthritis, unspecified site: Secondary | ICD-10-CM | POA: Diagnosis not present

## 2016-11-23 DIAGNOSIS — M25559 Pain in unspecified hip: Secondary | ICD-10-CM | POA: Diagnosis not present

## 2016-11-23 DIAGNOSIS — D649 Anemia, unspecified: Secondary | ICD-10-CM | POA: Diagnosis not present

## 2016-11-23 DIAGNOSIS — Z5181 Encounter for therapeutic drug level monitoring: Secondary | ICD-10-CM | POA: Diagnosis not present

## 2016-11-23 DIAGNOSIS — I10 Essential (primary) hypertension: Secondary | ICD-10-CM | POA: Diagnosis not present

## 2016-11-23 DIAGNOSIS — Z96659 Presence of unspecified artificial knee joint: Secondary | ICD-10-CM | POA: Diagnosis not present

## 2016-11-23 DIAGNOSIS — R278 Other lack of coordination: Secondary | ICD-10-CM | POA: Diagnosis not present

## 2016-11-23 DIAGNOSIS — Z4789 Encounter for other orthopedic aftercare: Secondary | ICD-10-CM | POA: Diagnosis not present

## 2016-11-23 DIAGNOSIS — S8002XA Contusion of left knee, initial encounter: Secondary | ICD-10-CM | POA: Diagnosis not present

## 2016-11-23 DIAGNOSIS — D62 Acute posthemorrhagic anemia: Secondary | ICD-10-CM | POA: Diagnosis not present

## 2016-11-23 DIAGNOSIS — M6281 Muscle weakness (generalized): Secondary | ICD-10-CM | POA: Diagnosis not present

## 2016-11-23 DIAGNOSIS — E039 Hypothyroidism, unspecified: Secondary | ICD-10-CM | POA: Diagnosis not present

## 2016-11-23 DIAGNOSIS — M8580 Other specified disorders of bone density and structure, unspecified site: Secondary | ICD-10-CM | POA: Diagnosis not present

## 2016-11-23 DIAGNOSIS — R2689 Other abnormalities of gait and mobility: Secondary | ICD-10-CM | POA: Diagnosis not present

## 2016-11-23 LAB — CBC
HEMATOCRIT: 30.4 % — AB (ref 36.0–46.0)
Hemoglobin: 9.9 g/dL — ABNORMAL LOW (ref 12.0–15.0)
MCH: 29.1 pg (ref 26.0–34.0)
MCHC: 32.6 g/dL (ref 30.0–36.0)
MCV: 89.4 fL (ref 78.0–100.0)
Platelets: 224 10*3/uL (ref 150–400)
RBC: 3.4 MIL/uL — ABNORMAL LOW (ref 3.87–5.11)
RDW: 12.9 % (ref 11.5–15.5)
WBC: 8.4 10*3/uL (ref 4.0–10.5)

## 2016-11-23 LAB — BASIC METABOLIC PANEL
Anion gap: 6 (ref 5–15)
BUN: 5 mg/dL — ABNORMAL LOW (ref 6–20)
CALCIUM: 8.5 mg/dL — AB (ref 8.9–10.3)
CO2: 26 mmol/L (ref 22–32)
CREATININE: 0.47 mg/dL (ref 0.44–1.00)
Chloride: 103 mmol/L (ref 101–111)
Glucose, Bld: 159 mg/dL — ABNORMAL HIGH (ref 65–99)
Potassium: 3.4 mmol/L — ABNORMAL LOW (ref 3.5–5.1)
SODIUM: 135 mmol/L (ref 135–145)

## 2016-11-23 NOTE — H&P (Signed)
PATIENT ID: Cailah Reach        MRN:  [id number]          DOB/AGE: Jan 08, 1951 / 66 y.o.    [name], MD   Biagio Borg, PA-C [address] [place], [place]  [address]                             [phone]   PROGRESS NOTE  Subjective:  negative for Chest Pain  negative for Shortness of Breath  negative for Nausea/Vomiting   negative for Calf Pain    Tolerating Diet: yes         Patient reports pain as mild.     Feeling better this am after BM-abdomen soft and non tender  Objective: Vital signs in last 24 hours:   Patient Vitals for the past 24 hrs:  BP Temp Temp src Pulse Resp SpO2  11/23/16 0424 125/66 98.9 F (37.2 C) Oral 86 18 99 %  11/22/16 2101 (!) 146/70 98.9 F (37.2 C) Oral 82 18 98 %  11/22/16 1409 (!) 138/57 99.1 F (37.3 C) Oral 83 20 100 %      Intake/Output from previous day:   07/12 0701 - 07/13 0700 In: 480 [P.O.:480] Out: 1    Intake/Output this shift:   No intake/output data recorded.   Intake/Output      07/12 0701 - 07/13 0700 07/13 0701 - 07/14 0700   P.O. 480    Total Intake(mL/kg) 480 (6.3)    Stool 1    Total Output 1     Net +479          Urine Occurrence 3 x       LABORATORY DATA:  Recent Labs  11/21/16 0616 11/22/16 0629 11/23/16 0309  WBC 6.7 8.7 8.4  HGB 10.6* 10.3* 9.9*  HCT 32.6* 31.5* 30.4*  PLT 231 231 224    Recent Labs  11/21/16 0616 11/22/16 0629 11/23/16 0309  NA 135 131* 135  K 4.1 3.9 3.4*  CL 105 102 103  CO2 25 25 26   BUN 8 8 5*  CREATININE 0.65 0.54 0.47  GLUCOSE 122* 164* 159*  CALCIUM 8.8* 8.3* 8.5*   Lab Results  Component Value Date   INR 1.07 11/08/2016   INR 1.06 09/02/2014   INR 1.01 10/07/2012    Recent Radiographic Studies :  Dg Chest 2 View  Result Date: 11/08/2016 CLINICAL DATA:  Preop evaluation for upcoming knee surgery EXAM: CHEST  2 VIEW COMPARISON:  09/02/2014 FINDINGS: Cardiac shadow is at the upper limits of normal in size. The lungs are clear bilaterally. No  acute bony abnormality is seen. IMPRESSION: No acute abnormality noted. Electronically Signed   By: Inez Catalina M.D.   On: 11/08/2016 16:50     Examination:  General appearance: alert, cooperative and no distress  Wound Exam: clean, dry, intact   Drainage:  None: wound tissue dry  Motor Exam: EHL, FHL, Anterior Tibial and Posterior Tibial Intact  Sensory Exam: Superficial Peroneal, Deep Peroneal and Tibial normal  Vascular Exam: Normal  Assessment:    3 Days Post-Op  Procedure(s) (LRB): LEFT TOTAL KNEE ARTHROPLASTY (Left)  ADDITIONAL DIAGNOSIS:  Active Problems:   S/P total knee replacement using cement, left  Acute Blood Loss Anemia-asymptomatic   Plan: Physical Therapy as ordered   DVT Prophylaxis:  Partial Weight Bearing @ 50% (PWB)  DISCHARGE PLAN: Skilled Nursing Facility/Rehab  DISCHARGE NEEDS: HHPT, CPM,  Walker and 3-in-1 comode seat Ready for rehab at [facility]-no complaints this am       Garald Balding  11/23/2016 7:30 AM

## 2016-11-23 NOTE — Progress Notes (Signed)
Patient will discharge to [facility] Anticipated discharge date: 7/13 Family notified: spouse at bedside Transportation by [facility]- called at 1:25pm  [place] signing off.  Jorge Ny, LCSW Clinical Social Worker [phone]

## 2016-11-23 NOTE — Clinical Social Work Placement (Signed)
   CLINICAL SOCIAL WORK PLACEMENT  NOTE  Date:  11/23/2016  Patient Details  Name: Kimberly [name] MRN: [id number] Date of Birth: [birth year]  Clinical Social Work is seeking post-discharge placement for this patient at the [place] level of care (*CSW will initial, date and re-position this form in  chart as items are completed):  Yes   Patient/family provided with [place] Work Department's list of facilities offering this level of care within the geographic area requested by the patient (or if unable, by the patient's family).  Yes   Patient/family informed of their freedom to choose among providers that offer the needed level of care, that participate in Medicare, Medicaid or managed care program needed by the patient, have an available bed and are willing to accept the patient.  Yes   Patient/family informed of 's ownership interest in [facility] and [facility], as well as of the fact that they are under no obligation to receive care at these facilities.  PASRR submitted to EDS on 11/20/16     PASRR number received on 11/20/16     Existing PASRR number confirmed on       FL2 transmitted to all facilities in geographic area requested by pt/family on 11/20/16     FL2 transmitted to all facilities within larger geographic area on       Patient informed that his/her managed care company has contracts with or will negotiate with certain facilities, including the following:        Yes   Patient/family informed of bed offers received.  Patient chooses bed at [facility]     Physician recommends and patient chooses bed at      Patient to be transferred to [facility] on 11/23/16.  Patient to be transferred to facility by [facility]     Patient family notified on 11/23/16 of transfer.  Name of family member notified:  spouse     PHYSICIAN       Additional Comment:     _______________________________________________ Jorge Ny, LCSW 11/23/2016, 12:24 PM

## 2016-11-23 NOTE — Progress Notes (Signed)
Physical Therapy Treatment Patient Details Name: Kimberly [name] MRN: [id number] DOB: 09-14-1950 Today's Date: 11/23/2016    History of Present Illness Pt is a 66 y/o female s/p L TKA secondary to L knee OA. PMH includes DVT, metabolic syndrome, HTN, osteopenia and R TKA.     PT Comments    Pt is up to walk with PT and noted her increased independence and safety with gait as compared to yesterday.  Her plan is to progress to SNF, with discharge today anticipated.  Her family will assist her with care after dc, but will need SNF care in the interim due to safety on walker and her limitations of L knee flexibility.  Continue acutely with PT if her DC is delayed.   Follow Up Recommendations  SNF     Equipment Recommendations  None recommended by PT    Recommendations for Other Services       Precautions / Restrictions Precautions Precautions: Fall;Knee Precaution Booklet Issued: Yes (comment) Restrictions Weight Bearing Restrictions: Yes LLE Weight Bearing: Partial weight bearing LLE Partial Weight Bearing Percentage or Pounds: 50    Mobility  Bed Mobility               General bed mobility comments: up when PT entered  Transfers Overall transfer level: Needs assistance Equipment used: Rolling walker (2 wheeled) Transfers: Sit to/from Stand Sit to Stand: Min guard         General transfer comment: able to get her LLE under her to fully standat RW  Ambulation/Gait Ambulation/Gait assistance: Min guard Ambulation Distance (Feet): 120 Feet Assistive device: Rolling walker (2 wheeled) Gait Pattern/deviations: Decreased stance time - left;Decreased stride length;Step-through pattern;Wide base of support;Antalgic Gait velocity: decreased Gait velocity interpretation: Below normal speed for age/gender General Gait Details: used RW to maintain PWB well, consistent   Stairs            Wheelchair Mobility    Modified Rankin (Stroke Patients Only)        Balance Overall balance assessment: Needs assistance Sitting-balance support: Feet supported Sitting balance-Leahy Scale: Good     Standing balance support: Bilateral upper extremity supported Standing balance-Leahy Scale: Fair                              Cognition Arousal/Alertness: Awake/alert Behavior During Therapy: WFL for tasks assessed/performed Overall Cognitive Status: Within Functional Limits for tasks assessed                                        Exercises Total Joint Exercises Ankle Circles/Pumps: AROM;Both;5 reps Quad Sets: AROM;Both;10 reps Gluteal Sets: AROM;Both;10 reps Towel Squeeze: AROM;Both;5 reps Heel Slides: AAROM;AROM;Both;10 reps Hip ABduction/ADduction: AROM;AAROM;Both;10 reps Straight Leg Raises: AAROM;Left;10 reps Long Arc Quad: AAROM;Both;10 reps Goniometric ROM: -5 ext, 60 flexion    General Comments        Pertinent Vitals/Pain Pain Assessment: 0-10 Pain Score: 7  Pain Location: L knee with initial mobility Pain Descriptors / Indicators: Operative site guarding;Sore Pain Intervention(s): Monitored during session;Premedicated before session;Repositioned;Ice applied    Home Living                      Prior Function            PT Goals (current goals can now be found in the care plan section) Acute Rehab  PT Goals Patient Stated Goal: rehab then home Progress towards PT goals: Progressing toward goals    Frequency    7X/week      PT Plan Current plan remains appropriate    Co-evaluation              AM-PAC PT "6 Clicks" Daily Activity  Outcome Measure  Difficulty turning over in bed (including adjusting bedclothes, sheets and blankets)?: A Little Difficulty moving from lying on back to sitting on the side of the bed? : A Little Difficulty sitting down on and standing up from a chair with arms (e.g., wheelchair, bedside commode, etc,.)?: A Little Help needed moving to and from  a bed to chair (including a wheelchair)?: A Little Help needed walking in hospital room?: A Little Help needed climbing 3-5 steps with a railing? : A Little 6 Click Score: 18    End of Session Equipment Utilized During Treatment: Gait belt Activity Tolerance: Patient tolerated treatment well Patient left: in chair;with call bell/phone within reach;with family/visitor present Nurse Communication: Mobility status PT Visit Diagnosis: Other abnormalities of gait and mobility (R26.89);Pain Pain - Right/Left: Left Pain - part of body: Knee     Time: 1201-1226 PT Time Calculation (min) (ACUTE ONLY): 25 min  Charges:  $Gait Training: 8-22 mins $Therapeutic Exercise: 8-22 mins                    G Codes:  Functional Assessment Tool Used: AM-PAC 6 Clicks Basic Mobility    Ramond Dial 11/23/2016, 2:17 PM   Mee Hives, PT MS Acute Rehab Dept. Number: [place] and [place]

## 2016-11-23 NOTE — Progress Notes (Signed)
[name] to be D/C'd Skilled nursing facility per MD order.  Discussed with the patient and all questions fully answered.  VSS, Skin clean, dry and intact without evidence of skin break down, no evidence of skin tears noted. IV catheter discontinued intact. Site without signs and symptoms of complications. Dressing and pressure applied.  An After Visit Summary was printed and given to the patient. Patient received prescription.  D/c education completed with patient/family including follow up instructions, medication list, d/c activities limitations if indicated, with other d/c instructions as indicated by MD - patient able to verbalize understanding, all questions fully answered.   Patient instructed to return to ED, call 911, or call MD for any changes in condition.   Patient escorted via stretcher, and transported by [place].  Karolee Ohs 11/23/2016 2:36 PM

## 2016-11-23 NOTE — Care Management Important Message (Signed)
Important Message  Patient Details  Name: Kimberly [name] MRN: [id number] Date of Birth: 11/06/1950   Medicare Important Message Given:  Yes    [name] [name] 11/23/2016, 1:57 PM

## 2016-11-23 NOTE — Discharge Summary (Signed)
[name], MD   Biagio Borg, PA-C [address], [place],   [address]                             [phone]  PATIENT ID: Kimberly [name]        MRN:  [id number]          DOB/AGE: 01/02/1951 / 66 y.o.    DISCHARGE SUMMARY  ADMISSION DATE:    11/20/2016 DISCHARGE DATE:   11/23/2016   ADMISSION DIAGNOSIS: LEFT KNEE OSTEOARTHRITIS    DISCHARGE DIAGNOSIS:  LEFT KNEE OSTEOARTHRITIS    ADDITIONAL DIAGNOSIS: Active Problems:   S/P total knee replacement using cement, left  Past Medical History:  Diagnosis Date  . Anemia 2005   hb 11.5  . Arthritis   . DVT (deep venous thrombosis) ([facility])    2003, coumadin x 6 mon  . GERD (gastroesophageal reflux disease) 2001   no problem now  . Hypertension   . Hypothyroidism    off meds  . Neuromuscular disorder ([facility])    painful in L foot, being started on Gabapentin- 11/08/2016  . Osteopenia   . Vegetarian diet    eats fish and eggs; no meat    PROCEDURE: Procedure(s): LEFT TOTAL KNEE ARTHROPLASTY  on 11/20/2016  CONSULTS: none    HISTORY: Patient is a 66 y.o. female presented with a history of pain in the left knee for 5 years. Onset of symptoms was gradual starting 5 years ago with gradually worsening course since that time. Prior procedures on the knee are NONE. Patient has been treated conservatively with over-the-counter NSAIDs and activity modification. Patient currently rates pain in the knee at 8 out of 10 with activity. There is pain at night. present  HOSPITAL COURSE:  Kimberly [name] is a 66 y.o. admitted on 11/20/2016 and found to have a diagnosis of LEFT KNEE OSTEOARTHRITIS.  After appropriate laboratory studies were obtained  they were taken to the operating room on 11/20/2016 and underwent  Procedure(s): LEFT TOTAL KNEE ARTHROPLASTY  .   They were given perioperative antibiotics:  Anti-infectives    Start     Dose/Rate Route Frequency Ordered Stop   11/20/16 1330  ceFAZolin (ANCEF) IVPB 2g/100 mL premix     2  g 200 mL/hr over 30 Minutes Intravenous Every 6 hours 11/20/16 1148 11/20/16 2133   11/20/16 0529  ceFAZolin (ANCEF) IVPB 2g/100 mL premix     2 g 200 mL/hr over 30 Minutes Intravenous On call to O.R. 11/20/16 8469 11/20/16 0748    .  Tolerated the procedure well.  Placed with a foley intraoperatively.    Toradol was given post op.  POD #1, allowed out of bed to a chair.  PT for ambulation and exercise program.  Foley D/C'd in morning.  IV saline locked.  O2 discontionued. Hemovac pulled.  POD #2, continued PT and ambulation.  Marland Kitchen POD #3, continued PT  The remainder of the hospital course was dedicated to ambulation and strengthening.   The patient was discharged on 3 Days Post-Op in  Stable condition.  Blood products given:none  DIAGNOSTIC STUDIES: Recent vital signs:  Patient Vitals for the past 24 hrs:  BP Temp Temp src Pulse Resp SpO2  11/23/16 0424 125/66 98.9 F (37.2 C) Oral 86 18 99 %  11/22/16 2101 (!) 146/70 98.9 F (37.2 C) Oral 82 18 98 %  11/22/16 1409 (!) 138/57 99.1 F (37.3 C) Oral 83 20  100 %       Recent laboratory studies:  Recent Labs  11/21/16 0616 11/22/16 0629 11/23/16 0309  WBC 6.7 8.7 8.4  HGB 10.6* 10.3* 9.9*  HCT 32.6* 31.5* 30.4*  PLT 231 231 224    Recent Labs  11/21/16 0616 11/22/16 0629 11/23/16 0309  NA 135 131* 135  K 4.1 3.9 3.4*  CL 105 102 103  CO2 25 25 26   BUN 8 8 5*  CREATININE 0.65 0.54 0.47  GLUCOSE 122* 164* 159*  CALCIUM 8.8* 8.3* 8.5*   Lab Results  Component Value Date   INR 1.07 11/08/2016   INR 1.06 09/02/2014   INR 1.01 10/07/2012     Recent Radiographic Studies :  Dg Chest 2 View  Result Date: 11/08/2016 CLINICAL DATA:  Preop evaluation for upcoming knee surgery EXAM: CHEST  2 VIEW COMPARISON:  09/02/2014 FINDINGS: Cardiac shadow is at the upper limits of normal in size. The lungs are clear bilaterally. No acute bony abnormality is seen. IMPRESSION: No acute abnormality noted. Electronically Signed    By: Inez Catalina M.D.   On: 11/08/2016 16:50    DISCHARGE INSTRUCTIONS: Discharge Instructions    CPM    Complete by:  As directed    Continuous passive motion machine (CPM):      Use the CPM from 0 to 60 degrees for 6-8 hours per day.      You may increase by 5-10 per day.  You may break it up into 2 or 3 sessions per day.      Use CPM for 3-4 weeks or until you are told to stop.   Call MD / Call 911    Complete by:  As directed    If you experience chest pain or shortness of breath, CALL 911 and be transported to the hospital emergency room.  If you develope a fever above 101 F, pus (white drainage) or increased drainage or redness at the wound, or calf pain, call your surgeon's office.   Change dressing    Complete by:  As directed    DO NOT CHANGE YOUR DRESSING.   Constipation Prevention    Complete by:  As directed    Drink plenty of fluids.  Prune juice may be helpful.  You may use a stool softener, such as Colace (over the counter) 100 mg twice a day.  Use MiraLax (over the counter) for constipation as needed.   Diet general    Complete by:  As directed    Discharge instructions    Complete by:  As directed    [place] items at home which could result in a fall. This includes throw rugs or furniture in walking pathways ICE to the affected joint every three hours while awake for 30 minutes at a time, for at least the first 3-5 days, and then as needed for pain and swelling.  Continue to use ice for pain and swelling. You may notice swelling that will progress down to the foot and ankle.  This is normal after surgery.  Elevate your leg when you are not up walking on it.   Continue to use the breathing machine you got in the hospital (incentive spirometer) which will help keep your temperature down.  It is common for your temperature to cycle up and down following surgery, especially at night when you are not up moving around and exerting yourself.   The breathing machine keeps your lungs expanded and  your temperature down.   DIET:  As you were doing prior to hospitalization, we recommend a well-balanced diet.  DRESSING / WOUND CARE / SHOWERING  Keep the surgical dressing until follow up.  The dressing is water proof, so you can shower without any extra covering.  IF THE DRESSING FALLS OFF or the wound gets wet inside, change the dressing with sterile gauze.  Please use good hand washing techniques before changing the dressing.  Do not use any lotions or creams on the incision until instructed by your surgeon.    ACTIVITY  Increase activity slowly as tolerated, but follow the weight bearing instructions below.   No driving for 6 weeks or until further direction given by your physician.  You cannot drive while taking narcotics.  No lifting or carrying greater than 10 lbs. until further directed by your surgeon. Avoid periods of inactivity such as sitting longer than an hour when not asleep. This helps prevent blood clots.  You may return to work once you are authorized by your doctor.     WEIGHT BEARING   Partial weight bearing with assist device as directed.  50%   EXERCISES  Results after joint replacement surgery are often greatly improved when you follow the exercise, range of motion and muscle strengthening exercises prescribed by your doctor. Safety measures are also important to protect the joint from further injury. Any time any of these exercises cause you to have increased pain or swelling, decrease what you are doing until you are comfortable again and then slowly increase them. If you have problems or questions, call your caregiver or physical therapist for advice.   Rehabilitation is important following a joint replacement. After just a few days of immobilization, the muscles of the leg can become weakened and shrink (atrophy).  These exercises are designed to build up the tone and strength of the thigh and leg muscles  and to improve motion. Often times heat used for twenty to thirty minutes before working out will loosen up your tissues and help with improving the range of motion but do not use heat for the first two weeks following surgery (sometimes heat can increase post-operative swelling).   These exercises can be done on a training (exercise) mat,  on a table or on a bed. Use whatever works the best and is most comfortable for you.    Use music or television while you are exercising so that the exercises are a pleasant break in your day. This will make your life better with the exercises acting as a break in your routine that you can look forward to.   Perform all exercises about fifteen times, three times per day or as directed.  You should exercise both the operative leg and the other leg as well.   Exercises include:  Quad Sets - Tighten up the muscle on the front of the thigh (Quad) and hold for 5-10 seconds.   Straight Leg Raises - With your knee straight (if you were given a brace, keep it on), lift the leg to 60 degrees, hold for 3 seconds, and slowly lower the leg.  Perform this exercise against resistance later as your leg gets stronger.  Leg Slides: Lying on your back, slowly slide your foot toward your buttocks, bending your knee up off the floor (only go as far as is comfortable). Then slowly slide your foot back down until your leg is flat on the floor again.  Angel Wings: Lying on your back spread  your legs to the side as far apart as you can without causing discomfort.  Hamstring Strength:  Lying on your back, push your heel against the floor with your leg straight by tightening up the muscles of your buttocks.  Repeat, but this time bend your knee to a comfortable angle, and push your heel against the floor.  You may put a pillow under the heel to make it more comfortable if necessary.   A rehabilitation program following joint replacement surgery can speed recovery and prevent re-injury in the  future due to weakened muscles. Contact your doctor or a physical therapist for more information on knee rehabilitation.    CONSTIPATION  Constipation is defined medically as fewer than three stools per week and severe constipation as less than one stool per week.  Even if you have a regular bowel pattern at home, your normal regimen is likely to be disrupted due to multiple reasons following surgery.  Combination of anesthesia, postoperative narcotics, change in appetite and fluid intake all can affect your bowels.   YOU MUST use at least one of the following options; they are listed in order of increasing strength to get the job done.  They are all available over the counter, and you may need to use some, POSSIBLY even all of these options:    Drink plenty of fluids (prune juice may be helpful) and high fiber foods Colace 100 mg by mouth twice a day  Senokot for constipation as directed and as needed Dulcolax (bisacodyl), take with full glass of water  Miralax (polyethylene glycol) once or twice a day as needed.  If you have tried all these things and are unable to have a bowel movement in the first 3-4 days after surgery call either your surgeon or your primary doctor.    If you experience loose stools or diarrhea, hold the medications until you stool forms back up.  If your symptoms do not get better within 1 week or if they get worse, check with your doctor.  If you experience "the worst abdominal pain ever" or develop nausea or vomiting, please contact the office immediately for further recommendations for treatment.   ITCHING:  If you experience itching with your medications, try taking only a single pain pill, or even half a pain pill at a time.  You can also use Benadryl over the counter for itching or also to help with sleep.   TED HOSE STOCKINGS:  Use stockings on both legs until for at least 2 weeks or as directed by physician office. They may be removed at night for  sleeping.  MEDICATIONS:  See your medication summary on the "After Visit Summary" that nursing will review with you.  You may have some home medications which will be placed on hold until you complete the course of blood thinner medication.  It is important for you to complete the blood thinner medication as prescribed.  PRECAUTIONS:  If you experience chest pain or shortness of breath - call 911 immediately for transfer to the hospital emergency department.   If you develop a fever greater that 101 F, purulent drainage from wound, increased redness or drainage from wound, foul odor from the wound/dressing, or calf pain - CONTACT YOUR SURGEON.  FOLLOW-UP APPOINTMENTS:  If you do not already have a post-op appointment, please call the office for an appointment to be seen by your surgeon.  Guidelines for how soon to be seen are listed in your "After Visit Summary", but are typically between 1-4 weeks after surgery.  OTHER INSTRUCTIONS:   Knee Replacement:  Do not place pillow under knee, focus on keeping the knee straight while resting. CPM instructions: 0-90 degrees, 2 hours in the morning, 2 hours in the afternoon, and 2 hours in the evening. Place foam block, curve side up under heel at all times except when in CPM or when walking.  DO NOT modify, tear, cut, or change the foam block in any way.  MAKE SURE YOU:  Understand these instructions.  Get help right away if you are not doing well or get worse.    Thank you for letting us be a part of your medical care team.  It is a privilege we respect greatly.  We hope these instructions will help you stay on track for a fast and full recovery!   Do not put a pillow under the knee. Place it under the heel.    Complete by:  As directed    Driving restrictions    Complete by:  As directed    No driving for 6 weeks   Increase activity slowly as tolerated    Complete by:  As directed    Lifting  restrictions    Complete by:  As directed    No lifting for 6 weeks   Partial weight bearing    Complete by:  As directed    % Body Weight:  50%   Laterality:  left   Extremity:  Lower   Patient may shower    Complete by:  As directed    You may shower over your dressing   TED hose    Complete by:  As directed    Use stockings (TED hose) for 3 weeks on left  leg.  You may remove them at night for sleeping.      DISCHARGE MEDICATIONS:   Allergies as of 11/23/2016      Reactions   Enoxaparin Sodium Itching, Swelling   SWELLING REACTION UNSPECIFIED       Medication List    STOP taking these medications   cephALEXin 500 MG capsule Commonly known as:  KEFLEX   ibuprofen 200 MG tablet Commonly known as:  ADVIL,MOTRIN   TURMERIC PO   valACYclovir 1000 MG tablet Commonly known as:  VALTREX     TAKE these medications   acetaminophen 500 MG tablet Commonly known as:  TYLENOL Take 1,000 mg by mouth daily as needed for moderate pain.   b complex vitamins tablet Take 1 tablet by mouth 4 (four) times a week.   CALCIUM 600 PO Take 600 mg by mouth daily.   cholecalciferol 1000 units tablet Commonly known as:  VITAMIN D Take 1,000 Units by mouth daily.   gabapentin 100 MG capsule Commonly known as:  NEURONTIN Take 1 capsule (100 mg total) by mouth 3 (three) times daily.   irbesartan 150 MG tablet Commonly known as:  AVAPRO Take 1 tablet (150 mg total) by mouth daily. What changed:  when to take this   levothyroxine 50 MCG tablet Commonly known as:  SYNTHROID, LEVOTHROID TAKE 1 TABLET ONE TIME DAILY What changed:  how much to take  how to take this  when to take this  additional instructions  methocarbamol 500 MG tablet Commonly known as:  ROBAXIN Take 1 tablet (500 mg total) by mouth every 6 (six) hours as needed for muscle spasms.   oxyCODONE 5 MG immediate release tablet Commonly known as:  Oxy IR/ROXICODONE Take 1-2 tablets (5-10 mg total) by mouth  every 4 (four) hours as needed for breakthrough pain.   rivaroxaban 10 MG Tabs tablet Commonly known as:  XARELTO Take 1 tablet (10 mg total) by mouth daily with breakfast.            Durable Medical Equipment        Start     Ordered   11/20/16 1149  DME Walker rolling  Once    Question:  Patient needs a walker to treat with the following condition  Answer:  S/P total knee replacement using cement, right   11/20/16 1148   11/20/16 1149  DME 3 n 1  Once     11/20/16 1148   11/20/16 1149  DME Bedside commode  Once    Question:  Patient needs a bedside commode to treat with the following condition  Answer:  S/P total knee replacement using cement, right   11/20/16 1148      FOLLOW UP VISIT:   Follow-up Information    Garald Balding, MD Follow up on 12/03/2016.   Specialty:  Orthopedic Surgery Contact information: [address] [place] [address] [phone]           DISPOSITION:   Skilled Nursing Facility/Rehab  CONDITION:  Stable   Mike Craze. [place], [place] [phone]  11/23/2016 7:48 AM

## 2016-11-26 DIAGNOSIS — D649 Anemia, unspecified: Secondary | ICD-10-CM | POA: Diagnosis not present

## 2016-11-26 DIAGNOSIS — E039 Hypothyroidism, unspecified: Secondary | ICD-10-CM | POA: Diagnosis not present

## 2016-11-26 DIAGNOSIS — M170 Bilateral primary osteoarthritis of knee: Secondary | ICD-10-CM | POA: Diagnosis not present

## 2016-11-26 DIAGNOSIS — I10 Essential (primary) hypertension: Secondary | ICD-10-CM | POA: Diagnosis not present

## 2016-11-28 DIAGNOSIS — Z96659 Presence of unspecified artificial knee joint: Secondary | ICD-10-CM | POA: Diagnosis not present

## 2016-11-28 DIAGNOSIS — I10 Essential (primary) hypertension: Secondary | ICD-10-CM | POA: Diagnosis not present

## 2016-11-28 DIAGNOSIS — E039 Hypothyroidism, unspecified: Secondary | ICD-10-CM | POA: Diagnosis not present

## 2016-11-28 DIAGNOSIS — M1990 Unspecified osteoarthritis, unspecified site: Secondary | ICD-10-CM | POA: Diagnosis not present

## 2016-11-30 ENCOUNTER — Other Ambulatory Visit: Payer: Self-pay

## 2016-12-04 DIAGNOSIS — Z7901 Long term (current) use of anticoagulants: Secondary | ICD-10-CM | POA: Diagnosis not present

## 2016-12-04 DIAGNOSIS — Z96652 Presence of left artificial knee joint: Secondary | ICD-10-CM | POA: Diagnosis not present

## 2016-12-04 DIAGNOSIS — Z79891 Long term (current) use of opiate analgesic: Secondary | ICD-10-CM | POA: Diagnosis not present

## 2016-12-04 DIAGNOSIS — Z9181 History of falling: Secondary | ICD-10-CM | POA: Diagnosis not present

## 2016-12-04 DIAGNOSIS — Z471 Aftercare following joint replacement surgery: Secondary | ICD-10-CM | POA: Diagnosis not present

## 2016-12-06 ENCOUNTER — Ambulatory Visit (INDEPENDENT_AMBULATORY_CARE_PROVIDER_SITE_OTHER): Payer: Medicare Other

## 2016-12-06 ENCOUNTER — Ambulatory Visit (INDEPENDENT_AMBULATORY_CARE_PROVIDER_SITE_OTHER): Payer: Medicare Other | Admitting: Orthopedic Surgery

## 2016-12-06 ENCOUNTER — Other Ambulatory Visit (INDEPENDENT_AMBULATORY_CARE_PROVIDER_SITE_OTHER): Payer: Self-pay

## 2016-12-06 ENCOUNTER — Encounter (INDEPENDENT_AMBULATORY_CARE_PROVIDER_SITE_OTHER): Payer: Self-pay | Admitting: Orthopedic Surgery

## 2016-12-06 VITALS — BP 147/81 | HR 78 | Resp 14 | Ht 61.0 in | Wt 169.0 lb

## 2016-12-06 DIAGNOSIS — Z96652 Presence of left artificial knee joint: Secondary | ICD-10-CM | POA: Diagnosis not present

## 2016-12-06 DIAGNOSIS — Z79891 Long term (current) use of opiate analgesic: Secondary | ICD-10-CM | POA: Diagnosis not present

## 2016-12-06 DIAGNOSIS — Z471 Aftercare following joint replacement surgery: Secondary | ICD-10-CM | POA: Diagnosis not present

## 2016-12-06 DIAGNOSIS — Z9181 History of falling: Secondary | ICD-10-CM | POA: Diagnosis not present

## 2016-12-06 DIAGNOSIS — Z7901 Long term (current) use of anticoagulants: Secondary | ICD-10-CM | POA: Diagnosis not present

## 2016-12-06 NOTE — Progress Notes (Signed)
Office Visit Note   Patient: Kimberly [name]           Date of Birth: 12-06-1950           MRN: [id number] Visit Date: 12/06/2016              Requested by: Eulas Post, MD [place], [place] [address] PCP: Eulas Post, MD   Assessment & Plan: Visit Diagnoses:  1. Presence of left artificial knee joint   2. S/P total knee replacement using cement, left     Plan:   #1: Staples removed and Steri-Strips are placed #2: Given a five-year handicap sticker #3: Given her request for physical therapy at [facility] when she finishes her home PT #4: At this time because of her lack of motion elected never seen in 1 week to make sure she is progressing.  Follow-Up Instructions: Return in about 8 days (around 12/14/2016).   Orders:  Orders Placed This Encounter  Procedures  . XR KNEE 3 VIEW LEFT   No orders of the defined types were placed in this encounter.     Procedures: No procedures performed   Clinical Data: No additional findings.   Subjective: Chief Complaint  Patient presents with  . Left Knee - Routine Post Op    Kimberly [name] is 2 weeks status post Left TKA. Staples removed, steri strips applied. She reltes her L thigh is swollen and there is some pain behind the knee. She takes pain meds 1 in am, 1 in pm    Kimberly [name] is 16 days status post left total knee arthroplasty. Overall she is doing well. She is doing home physical therapy. Complains of some burning sensation of the dorsum of her foot which actually worsened with her TED hose. She states that she does not have any calf pain. She does have some pain posterior popliteal area. She is only taking 2 pain pills a day.         Review of Systems   Objective: Vital Signs: BP (!) 147/81   Pulse 78   Resp 14   Ht 5\' 1"  (1.549 m)   Wt 169 lb (76.7 kg)   LMP 05/14/1998   BMI 31.93 kg/m   Physical Exam  Ortho Exam  Left knee exam today reveals the wound to be healing per  primam no signs of infection. Incision looks great. Calf is nontender to palpation. She does have little ankle swelling. Sensation is intact on the dorsum of her foot. Good dorsalis pedis pulse. Range of motion she still lacks a few degrees full extension. She can flex to about 60. Ligamentously stable.  Specialty Comments:  No specialty comments available.  Imaging: Xr Knee 3 View Left  Result Date: 12/06/2016 Three-view knee reveals excellent position alignment of the prosthesis.    PMFS History: Patient Active Problem List   Diagnosis Date Noted  . S/P total knee replacement using cement, left 11/20/2016  . Osteopenia 02/28/2015  . Left lumbar radiculitis 02/28/2015  . Acute blood loss anemia 09/20/2014  . Primary osteoarthritis of right knee 09/14/2014  . Osteoarthritis of left knee 09/14/2014  . Vitamin B 12 deficiency 07/29/2014  . Obesity (BMI 30-39.9) 12/08/2013  . Osteoarthritis, knee 05/30/2012  . Metabolic syndrome 75/64/3329  . DVT, HX OF 02/10/2010  . Vitamin D deficiency 08/25/2007  . Disorder of bone and cartilage 04/08/2007  . Hypothyroidism 11/18/2006  . ANEMIA-NOS 11/18/2006  . Essential hypertension 11/18/2006  . GERD 11/18/2006  Past Medical History:  Diagnosis Date  . Anemia 2005   hb 11.5  . Arthritis   . DVT (deep venous thrombosis) ([facility])    2003, coumadin x 6 mon  . GERD (gastroesophageal reflux disease) 2001   no problem now  . Hypertension   . Hypothyroidism    off meds  . Neuromuscular disorder ([facility])    painful in L foot, being started on Gabapentin- 11/08/2016  . Osteopenia   . Vegetarian diet    eats fish and eggs; no meat    Family History  Problem Relation Age of Onset  . Diabetes Mother   . Heart failure Father   . Diabetes Other   . Alcohol abuse Other     Past Surgical History:  Procedure Laterality Date  . JOINT REPLACEMENT    . KNEE ARTHROSCOPY Right 03/16/2003  . OOPHORECTOMY     "think it was my left"  . TOTAL KNEE  ARTHROPLASTY Right 09/14/2014   Procedure: RIGHT TOTAL KNEE ARTHROPLASTY;  Surgeon: Garald Balding, MD;  Location: [facility];  Service: Orthopedics;  Laterality: Right;  . TOTAL KNEE ARTHROPLASTY Left 11/20/2016  . TOTAL KNEE ARTHROPLASTY Left 11/20/2016   Procedure: LEFT TOTAL KNEE ARTHROPLASTY;  Surgeon: Garald Balding, MD;  Location: [facility];  Service: Orthopedics;  Laterality: Left;   Social History   Occupational History  . Not on file.   Social History Main Topics  . Smoking status: Never Smoker  . Smokeless tobacco: Never Used  . Alcohol use No  . Drug use: No  . Sexual activity: Not on file

## 2016-12-07 ENCOUNTER — Inpatient Hospital Stay (INDEPENDENT_AMBULATORY_CARE_PROVIDER_SITE_OTHER): Payer: Medicare Other | Admitting: Orthopaedic Surgery

## 2016-12-07 DIAGNOSIS — Z96652 Presence of left artificial knee joint: Secondary | ICD-10-CM | POA: Diagnosis not present

## 2016-12-07 DIAGNOSIS — Z79891 Long term (current) use of opiate analgesic: Secondary | ICD-10-CM | POA: Diagnosis not present

## 2016-12-07 DIAGNOSIS — Z7901 Long term (current) use of anticoagulants: Secondary | ICD-10-CM | POA: Diagnosis not present

## 2016-12-07 DIAGNOSIS — Z9181 History of falling: Secondary | ICD-10-CM | POA: Diagnosis not present

## 2016-12-07 DIAGNOSIS — Z471 Aftercare following joint replacement surgery: Secondary | ICD-10-CM | POA: Diagnosis not present

## 2016-12-10 ENCOUNTER — Inpatient Hospital Stay (INDEPENDENT_AMBULATORY_CARE_PROVIDER_SITE_OTHER): Payer: Medicare Other | Admitting: Orthopaedic Surgery

## 2016-12-10 DIAGNOSIS — Z9181 History of falling: Secondary | ICD-10-CM | POA: Diagnosis not present

## 2016-12-10 DIAGNOSIS — Z471 Aftercare following joint replacement surgery: Secondary | ICD-10-CM | POA: Diagnosis not present

## 2016-12-10 DIAGNOSIS — Z79891 Long term (current) use of opiate analgesic: Secondary | ICD-10-CM | POA: Diagnosis not present

## 2016-12-10 DIAGNOSIS — Z7901 Long term (current) use of anticoagulants: Secondary | ICD-10-CM | POA: Diagnosis not present

## 2016-12-10 DIAGNOSIS — Z96652 Presence of left artificial knee joint: Secondary | ICD-10-CM | POA: Diagnosis not present

## 2016-12-12 DIAGNOSIS — Z96652 Presence of left artificial knee joint: Secondary | ICD-10-CM | POA: Diagnosis not present

## 2016-12-12 DIAGNOSIS — Z79891 Long term (current) use of opiate analgesic: Secondary | ICD-10-CM | POA: Diagnosis not present

## 2016-12-12 DIAGNOSIS — Z7901 Long term (current) use of anticoagulants: Secondary | ICD-10-CM | POA: Diagnosis not present

## 2016-12-12 DIAGNOSIS — Z9181 History of falling: Secondary | ICD-10-CM | POA: Diagnosis not present

## 2016-12-12 DIAGNOSIS — Z471 Aftercare following joint replacement surgery: Secondary | ICD-10-CM | POA: Diagnosis not present

## 2016-12-14 ENCOUNTER — Ambulatory Visit: Payer: Medicare Other | Admitting: Family Medicine

## 2016-12-14 ENCOUNTER — Ambulatory Visit (INDEPENDENT_AMBULATORY_CARE_PROVIDER_SITE_OTHER): Payer: Medicare Other | Admitting: Orthopaedic Surgery

## 2016-12-14 ENCOUNTER — Encounter (INDEPENDENT_AMBULATORY_CARE_PROVIDER_SITE_OTHER): Payer: Self-pay | Admitting: Orthopaedic Surgery

## 2016-12-14 VITALS — BP 139/72 | HR 81 | Resp 14 | Ht 61.0 in | Wt 169.0 lb

## 2016-12-14 DIAGNOSIS — Z96652 Presence of left artificial knee joint: Secondary | ICD-10-CM | POA: Diagnosis not present

## 2016-12-14 DIAGNOSIS — Z7901 Long term (current) use of anticoagulants: Secondary | ICD-10-CM | POA: Diagnosis not present

## 2016-12-14 DIAGNOSIS — Z79891 Long term (current) use of opiate analgesic: Secondary | ICD-10-CM | POA: Diagnosis not present

## 2016-12-14 DIAGNOSIS — Z471 Aftercare following joint replacement surgery: Secondary | ICD-10-CM | POA: Diagnosis not present

## 2016-12-14 DIAGNOSIS — Z9181 History of falling: Secondary | ICD-10-CM | POA: Diagnosis not present

## 2016-12-14 NOTE — Progress Notes (Signed)
Post-Op Visit Note   Patient: Kimberly [name]           Date of Birth: 04-24-1951           MRN: [id number] Visit Date: 12/14/2016 PCP: Eulas Post, MD   Assessment & Plan:  Chief Complaint:  Chief Complaint  Patient presents with  . Left Knee - Routine Post Op    Kimberly [name] is a 66 y o that is almost 4 weeks status post L TKA is doing well. SHe relates husband is helping with PT and she is going to outpatient next week. Ambulating with a cane   Visit Diagnoses:  1. History of left knee replacement   3 weeks status post left total knee replacement. Now at home doing well with in home therapy. Physical therapy note is reviewed passive range of motion is -1-88. By my exam no instability. Wound is healing without evidence of infection. No distal edema. No calf pain. Clinically very nice alignment of left knee.  Plan: Start outpatient physical therapy next week at [facility] [place]. Progressive increase weightbearing with a single-point cane. Office 1 month  Follow-Up Instructions: Return in about 1 month (around 01/14/2017).   Orders:  No orders of the defined types were placed in this encounter.  No orders of the defined types were placed in this encounter.   Imaging: No results found.  PMFS History: Patient Active Problem List   Diagnosis Date Noted  . S/P total knee replacement using cement, left 11/20/2016  . Osteopenia 02/28/2015  . Left lumbar radiculitis 02/28/2015  . Acute blood loss anemia 09/20/2014  . Primary osteoarthritis of right knee 09/14/2014  . Osteoarthritis of left knee 09/14/2014  . Vitamin B 12 deficiency 07/29/2014  . Obesity (BMI 30-39.9) 12/08/2013  . Osteoarthritis, knee 05/30/2012  . Metabolic syndrome 14/43/1540  . DVT, HX OF 02/10/2010  . Vitamin D deficiency 08/25/2007  . Disorder of bone and cartilage 04/08/2007  . Hypothyroidism 11/18/2006  . ANEMIA-NOS 11/18/2006  . Essential hypertension 11/18/2006  . GERD  11/18/2006   Past Medical History:  Diagnosis Date  . Anemia 2005   hb 11.5  . Arthritis   . DVT (deep venous thrombosis) ([facility])    2003, coumadin x 6 mon  . GERD (gastroesophageal reflux disease) 2001   no problem now  . Hypertension   . Hypothyroidism    off meds  . Neuromuscular disorder ([facility])    painful in L foot, being started on Gabapentin- 11/08/2016  . Osteopenia   . Vegetarian diet    eats fish and eggs; no meat    Family History  Problem Relation Age of Onset  . Diabetes Mother   . Heart failure Father   . Diabetes Other   . Alcohol abuse Other     Past Surgical History:  Procedure Laterality Date  . JOINT REPLACEMENT    . KNEE ARTHROSCOPY Right 03/16/2003  . OOPHORECTOMY     "think it was my left"  . TOTAL KNEE ARTHROPLASTY Right 09/14/2014   Procedure: RIGHT TOTAL KNEE ARTHROPLASTY;  Surgeon: Garald Balding, MD;  Location: [facility];  Service: Orthopedics;  Laterality: Right;  . TOTAL KNEE ARTHROPLASTY Left 11/20/2016  . TOTAL KNEE ARTHROPLASTY Left 11/20/2016   Procedure: LEFT TOTAL KNEE ARTHROPLASTY;  Surgeon: Garald Balding, MD;  Location: [facility];  Service: Orthopedics;  Laterality: Left;   Social History   Occupational History  . Not on file.   Social History Main  Topics  . Smoking status: Never Smoker  . Smokeless tobacco: Never Used  . Alcohol use No  . Drug use: No  . Sexual activity: Not on file

## 2016-12-17 ENCOUNTER — Ambulatory Visit: Payer: Medicare Other | Attending: Orthopaedic Surgery

## 2016-12-17 DIAGNOSIS — R600 Localized edema: Secondary | ICD-10-CM | POA: Diagnosis not present

## 2016-12-17 DIAGNOSIS — G8929 Other chronic pain: Secondary | ICD-10-CM | POA: Insufficient documentation

## 2016-12-17 DIAGNOSIS — M6281 Muscle weakness (generalized): Secondary | ICD-10-CM | POA: Diagnosis not present

## 2016-12-17 DIAGNOSIS — M25662 Stiffness of left knee, not elsewhere classified: Secondary | ICD-10-CM | POA: Diagnosis not present

## 2016-12-17 DIAGNOSIS — M25562 Pain in left knee: Secondary | ICD-10-CM

## 2016-12-17 DIAGNOSIS — R2689 Other abnormalities of gait and mobility: Secondary | ICD-10-CM | POA: Diagnosis not present

## 2016-12-17 NOTE — Therapy (Signed)
[facility] [address] [place], [place], [address] Phone: [phone]   Fax:  [phone]  Physical Therapy Evaluation  Patient Details  Name: Kimberly [name] MRN: [id number] Date of Birth: [birth year] Referring Provider: [name], MD  Encounter Date: 12/17/2016      PT End of Session - 12/17/16 1142    Visit Number 1   Number of Visits 10   Date for PT Re-Evaluation 02/11/17   PT Start Time 4166   PT Stop Time 1200   PT Time Calculation (min) 57 min   Activity Tolerance Patient tolerated treatment well   Behavior During Therapy [facility] for tasks assessed/performed      Past Medical History:  Diagnosis Date  . Anemia 2005   hb 11.5  . Arthritis   . DVT (deep venous thrombosis) ([place])    2003, coumadin x 6 mon  . GERD (gastroesophageal reflux disease) 2001   no problem now  . Hypertension   . Hypothyroidism    off meds  . Neuromuscular disorder ([place])    painful in L foot, being started on Gabapentin- 11/08/2016  . Osteopenia   . Vegetarian diet    eats fish and eggs; no meat    Past Surgical History:  Procedure Laterality Date  . JOINT REPLACEMENT    . KNEE ARTHROSCOPY Right 03/16/2003  . OOPHORECTOMY     "think it was my left"  . TOTAL KNEE ARTHROPLASTY Right 09/14/2014   Procedure: RIGHT TOTAL KNEE ARTHROPLASTY;  Surgeon: Garald Balding, MD;  Location: [place];  Service: Orthopedics;  Laterality: Right;  . TOTAL KNEE ARTHROPLASTY Left 11/20/2016  . TOTAL KNEE ARTHROPLASTY Left 11/20/2016   Procedure: LEFT TOTAL KNEE ARTHROPLASTY;  Surgeon: Garald Balding, MD;  Location: [place];  Service: Orthopedics;  Laterality: Left;    There were no vitals filed for this visit.       Subjective Assessment - 12/17/16 1110    Subjective Pt presents to PT s/p Lt TKA performed 11/20/16 due to OA.  Pt has home health PT for 2 weeks and is now walking with a cane for community distances.     Pertinent History  Rt TKA 2016   Limitations Standing;Walking   How long can you stand comfortably? 15 minutes   How long can you walk comfortably? 15 minutes   Patient Stated Goals improve Lt knee A/ROM, improve endurance, imrpove gait   Currently in Pain? Yes   Pain Score 2    Pain Location Knee   Pain Orientation Left   Pain Descriptors / Indicators Tightness;Dull   Pain Type Surgical pain   Pain Onset 1 to 4 weeks ago   Pain Frequency Constant   Aggravating Factors  standing, walking, bending the knee   Pain Relieving Factors ice, rest, moving it to loosen   Effect of Pain on Daily Activities reduced endurance for standing            [facility] PT Assessment - 12/17/16 0001      Assessment   Medical Diagnosis Lt TKA   Referring Provider [name], MD   Onset Date/Surgical Date 11/20/16   Next MD Visit 01/11/17   Prior Therapy home health PT x 2 weeks     Precautions   Precautions None     Restrictions   Weight Bearing Restrictions No     Balance Screen   Has the patient fallen in the past 6 months No   Has the patient  had a decrease in activity level because of a fear of falling?  No   Is the patient reluctant to leave their home because of a fear of falling?  No     Home Social worker Private residence   Living Arrangements Spouse/significant other   Available Help at [place] to enter   Entrance Stairs-Number of Steps 2   [place] Two level;Able to live on main level with bedroom/bathroom   [place] - single point;Walker - 2 wheels;Grab bars - toilet;Grab bars - tub/shower     Prior Function   Level of Independence Independent   Vocation Retired   Leisure sewing, watching TV     Cognition   Overall Cognitive Status Within Functional Limits for tasks assessed     Observation/Other Assessments   Focus on Therapeutic Outcomes (FOTO)  59% limitation     Posture/Postural Control   Posture/Postural Control  Postural limitations   Postural Limitations Rounded Shoulders;Forward head;Flexed trunk     ROM / Strength   AROM / PROM / Strength AROM;PROM;Strength     AROM   Overall AROM  Deficits   AROM Assessment Site Knee   Right/Left Knee Left;Right   Right Knee Extension 3   Right Knee Flexion 105   Left Knee Extension 7  fair quad set   Left Knee Flexion 80     PROM   Overall PROM  Deficits   PROM Assessment Site Knee   Right/Left Knee Left   Left Knee Extension 5   Left Knee Flexion 84     Strength   Overall Strength Deficits   Overall Strength Comments fair Lt quad set.  5/5 Rt knee strength   Strength Assessment Site Knee   Right/Left Knee Left   Left Knee Flexion 4/5   Left Knee Extension 4-/5  quad lag     Palpation   Patella mobility reduced patellar mobility     Palpation comment well healing surgical incision with scabbing present.  Warmth and edema about the Lt knee that is moderate     Transfers   Transfers Sit to Stand;Stand to Sit   Sit to Stand With upper extremity assist;6: Modified independent (Device/Increase time)     Ambulation/Gait   Ambulation/Gait Yes   Assistive device Straight cane   Gait Pattern Step-through pattern;Decreased stride length;Decreased stance time - left;Antalgic   Ambulation Surface Level   Stairs Yes   Stair Management Technique Step to pattern            Objective measurements completed on examination: See above findings.          [facility] PT Treatment/Exercise - 12/17/16 0001      Exercises   Exercises Knee/Hip     Knee/Hip Exercises: Aerobic   Stationary Bike Level 0 x 6 minutes-partial revolutions for ROM     Modalities   Modalities Vasopneumatic     Vasopneumatic   Number Minutes Vasopneumatic  15 minutes   Vasopnuematic Location  Knee   Vasopneumatic Pressure Medium   Vasopneumatic Temperature  3 flakes                PT Education - 12/17/16 1138    Education provided Yes   Education  Details long arc quads, heel slides, short arc quads, quad sets   Person(s) Educated Patient   Methods Explanation;Demonstration;Handout   Comprehension Verbalized understanding;Returned demonstration  PT Short Term Goals - 12/17/16 1118      PT SHORT TERM GOAL #1   Title be independent in initial HEP   Time 4   Status New     PT SHORT TERM GOAL #2   Title demonstrate Lt knee A/ROM flexion to > or = to 95 degrees to imrpove sitting and descending steps   Time 4   Period Weeks   Status New     PT SHORT TERM GOAL #3   Title report < or = to 3/10 Lt knee pain with standing and walking   Time 4   Period Weeks   Status New     PT SHORT TERM GOAL #4   Title improve strength and endurance to stand and walk > or = to 25 minutes to improve community ambulation   Time 4   Period Weeks   Status New           PT Long Term Goals - 12/17/16 1118      PT LONG TERM GOAL #1   Title be independent in advanced HEP   Time 8   Period Weeks   Status New     PT LONG TERM GOAL #2   Title reduce FOTO to < or = to 42% limitation   Time 8   Period Weeks   Status New     PT LONG TERM GOAL #3   Title demonstrate Lt knee A/ROM flexion to > or = to 115 degrees to allow for squatting and descending steps without substitution   Time 8   Period Weeks   Status New     PT LONG TERM GOAL #4   Title improve Lt knee strength and flexibility to ascend and descend steps with step-over-step gait with use of 1 rail   Time 8   Period Weeks   Status New     PT LONG TERM GOAL #5   Title demonstrate 5/5 Lt knee strength with good quad set to improve safety and endurance in the community   Time 8   Period Weeks   Status New     Additional Long Term Goals   Additional Long Term Goals Yes     PT LONG TERM GOAL #6   Title improve strength and endurance to ambulate for 30-45 minutes without limitation   Time 8   Period Weeks   Status New                Plan - 12/17/16  1154    Clinical Impression Statement Pt presents to PT s/p Lt TKA performed 11/20/16 due to OA.  Pt had home health PT for 2 weeks.  Pt is ambulating all distances with a standard cane with mild antalgia, reduced Lt knee flexion during swing through and reduced time spent in stance.  Pt with fair quad set, reduced Lt knee A/ROM and strength and has edema about the joint.  Pt will benefit from skilled PT for edema management, A/ROM and strength progression and functional mobility training to improve standing endurance, function in the community and safety on unlevel surfaces.     History and Personal Factors relevant to plan of care: Rt TKA   Clinical Presentation Stable   Clinical Decision Making Low   Rehab Potential Good   PT Frequency 3x / week   PT Duration 8 weeks   PT Treatment/Interventions Cryotherapy;ADLs/Self Care Home Management;Functional mobility training;Stair training;Gait training;Ultrasound;Moist Heat;Therapeutic activities;Therapeutic exercise;Balance training;Neuromuscular re-education;Manual techniques;Vasopneumatic Device;Taping  PT Next Visit Plan scar and patellar mobs, soft tissue to distal hamstrings and quads, A/ROM, gait, game ready   Consulted and Agree with Plan of Care Patient      Patient will benefit from skilled therapeutic intervention in order to improve the following deficits and impairments:  Abnormal gait, Decreased range of motion, Pain, Increased edema, Decreased strength, Decreased scar mobility, Impaired flexibility, Decreased activity tolerance, Decreased endurance  Visit Diagnosis: Acute pain of left knee - Plan: PT plan of care cert/re-cert  Other abnormalities of gait and mobility - Plan: PT plan of care cert/re-cert  Localized edema - Plan: PT plan of care cert/re-cert  Stiffness of left knee, not elsewhere classified - Plan: PT plan of care cert/re-cert      G-Codes - 37/34/28 1103    Functional Assessment Tool Used (Outpatient Only) FOTO:  59% limitation   Functional Limitation Mobility: Walking and moving around   Mobility: Walking and Moving Around Current Status ([id number]) At least 40 percent but less than 60 percent impaired, limited or restricted   Mobility: Walking and Moving Around Goal Status [phone]) At least 40 percent but less than 60 percent impaired, limited or restricted       Problem List Patient Active Problem List   Diagnosis Date Noted  . S/P total knee replacement using cement, left 11/20/2016  . Osteopenia 02/28/2015  . Left lumbar radiculitis 02/28/2015  . Acute blood loss anemia 09/20/2014  . Primary osteoarthritis of right knee 09/14/2014  . Osteoarthritis of left knee 09/14/2014  . Vitamin B 12 deficiency 07/29/2014  . Obesity (BMI 30-39.9) 12/08/2013  . Osteoarthritis, knee 05/30/2012  . Metabolic syndrome 03/55/9741  . DVT, HX OF 02/10/2010  . Vitamin D deficiency 08/25/2007  . Disorder of bone and cartilage 04/08/2007  . Hypothyroidism 11/18/2006  . ANEMIA-NOS 11/18/2006  . Essential hypertension 11/18/2006  . GERD 11/18/2006     Sigurd Sos, PT 12/17/16 12:01 PM  [facility] [address], [place], [place], [address] Phone: [phone]   Fax:  [phone]  Name: Kimberly [name] MRN: [id number] Date of Birth: [birth year]

## 2016-12-17 NOTE — Patient Instructions (Addendum)
Knee Extension: Short Arc (Eccentric) - Supine or Sitting   Lie on back with roll under knee. Extend knee. Slowly lower foot for 3-5 seconds. _10__ reps per set, 3-_5__ sets per day, _7__ days per week.   Copyright  VHI. All rights reserved.  Quad Set   With other leg bent, foot flat, slowly tighten muscles on thigh of straight leg while counting out loud to _5___. Repeat _10-20___ times. Do __5Hip Flexion / Knee Extension: Straight-Leg Raise (Eccentric)  Heel Slides   Use a strap or towel to Slide left heel along bed towards bottom. Hold for _5__ seconds. Perform gently. Slide back to flat knee position. Repeat 10___ times. Do _3-5_ times a day.    KNEE: Extension, Long Arc Quads - Sitting    Raise leg until knee is straight.  Hold 5 seconds _10__ reps per set, _3-5__ sets per day  Copyright  VHI. All rights reserved.  [place] [address], [place], [place] [address] Phone # [phone] Fax [phone]

## 2016-12-18 ENCOUNTER — Encounter: Payer: Self-pay | Admitting: Family Medicine

## 2016-12-18 ENCOUNTER — Ambulatory Visit (INDEPENDENT_AMBULATORY_CARE_PROVIDER_SITE_OTHER): Payer: Medicare Other | Admitting: Family Medicine

## 2016-12-18 VITALS — BP 120/80 | HR 82 | Temp 98.4°F | Wt 165.8 lb

## 2016-12-18 DIAGNOSIS — D649 Anemia, unspecified: Secondary | ICD-10-CM | POA: Diagnosis not present

## 2016-12-18 LAB — CBC WITH DIFFERENTIAL/PLATELET
BASOS ABS: 0 10*3/uL (ref 0.0–0.1)
Basophils Relative: 0.5 % (ref 0.0–3.0)
Eosinophils Absolute: 0.1 10*3/uL (ref 0.0–0.7)
Eosinophils Relative: 2 % (ref 0.0–5.0)
HEMATOCRIT: 37.1 % (ref 36.0–46.0)
HEMOGLOBIN: 12 g/dL (ref 12.0–15.0)
LYMPHS ABS: 1.7 10*3/uL (ref 0.7–4.0)
Lymphocytes Relative: 32.2 % (ref 12.0–46.0)
MCHC: 32.5 g/dL (ref 30.0–36.0)
MCV: 92.3 fl (ref 78.0–100.0)
MONO ABS: 0.4 10*3/uL (ref 0.1–1.0)
MONOS PCT: 8.3 % (ref 3.0–12.0)
Neutro Abs: 3 10*3/uL (ref 1.4–7.7)
Neutrophils Relative %: 57 % (ref 43.0–77.0)
PLATELETS: 302 10*3/uL (ref 150.0–400.0)
RBC: 4.02 Mil/uL (ref 3.87–5.11)
RDW: 13.9 % (ref 11.5–15.5)
WBC: 5.2 10*3/uL (ref 4.0–10.5)

## 2016-12-18 MED ORDER — IRBESARTAN 150 MG PO TABS: 150.0000 mg | ORAL_TABLET | Freq: Every day | ORAL | 3 refills | Status: DC

## 2016-12-18 NOTE — Progress Notes (Signed)
Subjective:     Patient ID: Kimberly [name], female   DOB: 1950-11-30, 66 y.o.   MRN: [id number]  HPI Patient here requesting lab work. She had left total knee replacement July 10. Her hemoglobin prior to surgery was 12.1 and this dropped to 9.9. She did not receive any transfusions. She was in rehabilitation for 10 days and had increased malaise but slowly improving. She's taken over-the-counter iron 1 daily. No GI intolerance. She was on blood thinner was Xarelto for 2 weeks and is now off that. She's not had any obvious bleeding complications otherwise. No consistent lightheadedness with standing  Past Medical History:  Diagnosis Date  . Anemia 2005   hb 11.5  . Arthritis   . DVT (deep venous thrombosis) ([place])    2003, coumadin x 6 mon  . GERD (gastroesophageal reflux disease) 2001   no problem now  . Hypertension   . Hypothyroidism    off meds  . Neuromuscular disorder ([place])    painful in L foot, being started on Gabapentin- 11/08/2016  . Osteopenia   . Vegetarian diet    eats fish and eggs; no meat   Past Surgical History:  Procedure Laterality Date  . JOINT REPLACEMENT    . KNEE ARTHROSCOPY Right 03/16/2003  . OOPHORECTOMY     "think it was my left"  . TOTAL KNEE ARTHROPLASTY Right 09/14/2014   Procedure: RIGHT TOTAL KNEE ARTHROPLASTY;  Surgeon: Garald Balding, MD;  Location: [place];  Service: Orthopedics;  Laterality: Right;  . TOTAL KNEE ARTHROPLASTY Left 11/20/2016  . TOTAL KNEE ARTHROPLASTY Left 11/20/2016   Procedure: LEFT TOTAL KNEE ARTHROPLASTY;  Surgeon: Garald Balding, MD;  Location: [place];  Service: Orthopedics;  Laterality: Left;    reports that she has never smoked. She has never used smokeless tobacco. She reports that she does not drink alcohol or use drugs. family history includes Alcohol abuse in her other; Diabetes in her mother and other; Heart failure in her father. Allergies  Allergen Reactions  . Enoxaparin Sodium Itching and Swelling    SWELLING  REACTION UNSPECIFIED      Review of Systems  Constitutional: Negative for chills, fever and unexpected weight change.  Respiratory: Negative for shortness of breath.   Cardiovascular: Negative for chest pain.  Neurological: Negative for dizziness, syncope, weakness and light-headedness.       Objective:   Physical Exam  Constitutional: She appears well-developed and well-nourished.  Neck: Neck supple.  Cardiovascular: Normal rate and regular rhythm.   Pulmonary/Chest: Effort normal and breath sounds normal. No respiratory distress. She has no wheezes. She has no rales.  Musculoskeletal: She exhibits no edema.       Assessment:     Postoperative anemia following left total knee replacement    Plan:     -Recheck CBC today -Should be able to discontinue iron supplements soon. She is a vegetarian and we discussed good dietary sources of iron  Eulas Post MD [facility]

## 2016-12-19 ENCOUNTER — Encounter: Payer: Self-pay | Admitting: Physical Therapy

## 2016-12-19 ENCOUNTER — Ambulatory Visit: Payer: Medicare Other | Admitting: Physical Therapy

## 2016-12-19 DIAGNOSIS — M25562 Pain in left knee: Secondary | ICD-10-CM

## 2016-12-19 DIAGNOSIS — R600 Localized edema: Secondary | ICD-10-CM | POA: Diagnosis not present

## 2016-12-19 DIAGNOSIS — M6281 Muscle weakness (generalized): Secondary | ICD-10-CM | POA: Diagnosis not present

## 2016-12-19 DIAGNOSIS — M25662 Stiffness of left knee, not elsewhere classified: Secondary | ICD-10-CM

## 2016-12-19 DIAGNOSIS — G8929 Other chronic pain: Secondary | ICD-10-CM | POA: Diagnosis not present

## 2016-12-19 DIAGNOSIS — R2689 Other abnormalities of gait and mobility: Secondary | ICD-10-CM | POA: Diagnosis not present

## 2016-12-19 NOTE — Therapy (Signed)
[facility] [address] [place], [place], [address] Phone: [phone]   Fax:  [phone]  Physical Therapy Treatment  Patient Details  Name: Kimberly [name] MRN: [id number] Date of Birth: 01/29/1951 Referring Provider: [name], MD  Encounter Date: 12/19/2016      PT End of Session - 12/19/16 1022    Visit Number 2   Number of Visits 10   Date for PT Re-Evaluation 02/11/17   PT Start Time 7169   PT Stop Time 1115   PT Time Calculation (min) 57 min   Activity Tolerance Patient tolerated treatment well   Behavior During Therapy [facility] for tasks assessed/performed      Past Medical History:  Diagnosis Date  . Anemia 2005   hb 11.5  . Arthritis   . DVT (deep venous thrombosis) ([place])    2003, coumadin x 6 mon  . GERD (gastroesophageal reflux disease) 2001   no problem now  . Hypertension   . Hypothyroidism    off meds  . Neuromuscular disorder ([place])    painful in L foot, being started on Gabapentin- 11/08/2016  . Osteopenia   . Vegetarian diet    eats fish and eggs; no meat    Past Surgical History:  Procedure Laterality Date  . JOINT REPLACEMENT    . KNEE ARTHROSCOPY Right 03/16/2003  . OOPHORECTOMY     "think it was my left"  . TOTAL KNEE ARTHROPLASTY Right 09/14/2014   Procedure: RIGHT TOTAL KNEE ARTHROPLASTY;  Surgeon: Garald Balding, MD;  Location: [place];  Service: Orthopedics;  Laterality: Right;  . TOTAL KNEE ARTHROPLASTY Left 11/20/2016  . TOTAL KNEE ARTHROPLASTY Left 11/20/2016   Procedure: LEFT TOTAL KNEE ARTHROPLASTY;  Surgeon: Garald Balding, MD;  Location: [place];  Service: Orthopedics;  Laterality: Left;    There were no vitals filed for this visit.      Subjective Assessment - 12/19/16 1024    Subjective I am doing my HEP 3 x day.    Currently in Pain? Yes   Pain Score 1    Pain Location Knee   Pain Orientation Left   Pain Descriptors / Indicators Dull   Multiple Pain  Sites No            [facility] PT Assessment - 12/19/16 0001      AROM   Left Knee Extension 5   Left Knee Flexion 85                     [facility] Adult PT Treatment/Exercise - 12/19/16 0001      Knee/Hip Exercises: Stretches   Knee: Self-Stretch to increase Flexion --  On red ball 20x     Knee/Hip Exercises: Aerobic   Nustep L1 x 10 min  review of status     Knee/Hip Exercises: Seated   Long Arc Quad AROM;Strengthening;Left;2 sets;10 reps;Weights   Long Arc Quad Weight 2 lbs.     Knee/Hip Exercises: Supine   Short Arc Quad Sets AROM;Strengthening;Left;2 sets;10 reps   Short Arc Quad Sets Limitations First set 0#/ 2nd set 2# 10x   Bridges with [employer] --  Ball squeeze with glute squeeze 3 sec hold 20x     Modalities   Modalities Vasopneumatic     Vasopneumatic   Number Minutes Vasopneumatic  15 minutes   Vasopnuematic Location  Knee   Vasopneumatic Pressure Medium   Vasopneumatic Temperature  3 flakes     Manual Therapy  Manual Therapy Joint mobilization;Soft tissue mobilization;Myofascial release   Joint Mobilization Patella mobs   Soft tissue mobilization Lt quads/hams/peripatella   Myofascial Release scar massage                  PT Short Term Goals - 12/17/16 1118      PT SHORT TERM GOAL #1   Title be independent in initial HEP   Time 4   Status New     PT SHORT TERM GOAL #2   Title demonstrate Lt knee A/ROM flexion to > or = to 95 degrees to imrpove sitting and descending steps   Time 4   Period Weeks   Status New     PT SHORT TERM GOAL #3   Title report < or = to 3/10 Lt knee pain with standing and walking   Time 4   Period Weeks   Status New     PT SHORT TERM GOAL #4   Title improve strength and endurance to stand and walk > or = to 25 minutes to improve community ambulation   Time 4   Period Weeks   Status New           PT Long Term Goals - 12/17/16 1118      PT LONG TERM GOAL #1   Title be independent in  advanced HEP   Time 8   Period Weeks   Status New     PT LONG TERM GOAL #2   Title reduce FOTO to < or = to 42% limitation   Time 8   Period Weeks   Status New     PT LONG TERM GOAL #3   Title demonstrate Lt knee A/ROM flexion to > or = to 115 degrees to allow for squatting and descending steps without substitution   Time 8   Period Weeks   Status New     PT LONG TERM GOAL #4   Title improve Lt knee strength and flexibility to ascend and descend steps with step-over-step gait with use of 1 rail   Time 8   Period Weeks   Status New     PT LONG TERM GOAL #5   Title demonstrate 5/5 Lt knee strength with good quad set to improve safety and endurance in the community   Time 8   Period Weeks   Status New     Additional Long Term Goals   Additional Long Term Goals Yes     PT LONG TERM GOAL #6   Title improve strength and endurance to ambulate for 30-45 minutes without limitation   Time 8   Period Weeks   Status New               Plan - 12/19/16 1022    Clinical Impression Statement Pt is compliant and independent in initial HEP. Knee AROM improving, measuring knee today at 5-85 degrees in supine.  Edema remains but also improving slowly. Pt tolerated weights today for quad strength.    Rehab Potential Good   PT Frequency 3x / week   PT Duration 8 weeks   PT Treatment/Interventions Cryotherapy;ADLs/Self Care Home Management;Functional mobility training;Stair training;Gait training;Ultrasound;Moist Heat;Therapeutic activities;Therapeutic exercise;Balance training;Neuromuscular re-education;Manual techniques;Vasopneumatic Device;Taping   PT Next Visit Plan scar and patellar mobs, soft tissue to distal hamstrings and quads, A/ROM, gait, game ready   Consulted and Agree with Plan of Care Patient      Patient will benefit from skilled therapeutic intervention in order to improve the  following deficits and impairments:  Abnormal gait, Decreased range of motion, Pain,  Increased edema, Decreased strength, Decreased scar mobility, Impaired flexibility, Decreased activity tolerance, Decreased endurance  Visit Diagnosis: Acute pain of left knee  Other abnormalities of gait and mobility  Localized edema  Stiffness of left knee, not elsewhere classified  Chronic pain of left knee     Problem List Patient Active Problem List   Diagnosis Date Noted  . S/P total knee replacement using cement, left 11/20/2016  . Osteopenia 02/28/2015  . Left lumbar radiculitis 02/28/2015  . Acute blood loss anemia 09/20/2014  . Primary osteoarthritis of right knee 09/14/2014  . Osteoarthritis of left knee 09/14/2014  . Vitamin B 12 deficiency 07/29/2014  . Obesity (BMI 30-39.9) 12/08/2013  . Osteoarthritis, knee 05/30/2012  . Metabolic syndrome 70/96/2836  . DVT, HX OF 02/10/2010  . Vitamin D deficiency 08/25/2007  . Disorder of bone and cartilage 04/08/2007  . Hypothyroidism 11/18/2006  . ANEMIA-NOS 11/18/2006  . Essential hypertension 11/18/2006  . GERD 11/18/2006    Brockton Mckesson, PTA 12/19/2016, 11:01 AM  [facility] [address] [place], [place], [address] Phone: [phone]   Fax:  [phone]  Name: Kimberly [name] MRN: [id number] Date of Birth: 1950/10/14

## 2016-12-21 ENCOUNTER — Encounter: Payer: Self-pay | Admitting: Rehabilitation

## 2016-12-21 ENCOUNTER — Ambulatory Visit: Payer: Medicare Other | Admitting: Rehabilitation

## 2016-12-21 DIAGNOSIS — R600 Localized edema: Secondary | ICD-10-CM | POA: Diagnosis not present

## 2016-12-21 DIAGNOSIS — M25562 Pain in left knee: Secondary | ICD-10-CM

## 2016-12-21 DIAGNOSIS — M6281 Muscle weakness (generalized): Secondary | ICD-10-CM

## 2016-12-21 DIAGNOSIS — M25662 Stiffness of left knee, not elsewhere classified: Secondary | ICD-10-CM | POA: Diagnosis not present

## 2016-12-21 DIAGNOSIS — G8929 Other chronic pain: Secondary | ICD-10-CM | POA: Diagnosis not present

## 2016-12-21 DIAGNOSIS — R2689 Other abnormalities of gait and mobility: Secondary | ICD-10-CM | POA: Diagnosis not present

## 2016-12-21 NOTE — Therapy (Signed)
[facility] [address], [place], [place], [address] Phone: [phone]   Fax:  [phone]  Physical Therapy Treatment  Patient Details  Name: Kimberly [name] MRN: [id number] Date of Birth: [birth year] Referring Provider: [name], MD  Encounter Date: 12/21/2016      PT End of Session - 12/21/16 1122    Visit Number 3   Number of Visits 10   Date for PT Re-Evaluation 02/11/17   PT Start Time 0930   PT Stop Time 1025   PT Time Calculation (min) 55 min   Activity Tolerance Patient tolerated treatment well      Past Medical History:  Diagnosis Date  . Anemia 2005   hb 11.5  . Arthritis   . DVT (deep venous thrombosis) ([place])    2003, coumadin x 6 mon  . GERD (gastroesophageal reflux disease) 2001   no problem now  . Hypertension   . Hypothyroidism    off meds  . Neuromuscular disorder ([place])    painful in L foot, being started on Gabapentin- 11/08/2016  . Osteopenia   . Vegetarian diet    eats fish and eggs; no meat    Past Surgical History:  Procedure Laterality Date  . JOINT REPLACEMENT    . KNEE ARTHROSCOPY Right 03/16/2003  . OOPHORECTOMY     "think it was my left"  . TOTAL KNEE ARTHROPLASTY Right 09/14/2014   Procedure: RIGHT TOTAL KNEE ARTHROPLASTY;  Surgeon: Garald Balding, MD;  Location: [place];  Service: Orthopedics;  Laterality: Right;  . TOTAL KNEE ARTHROPLASTY Left 11/20/2016  . TOTAL KNEE ARTHROPLASTY Left 11/20/2016   Procedure: LEFT TOTAL KNEE ARTHROPLASTY;  Surgeon: Garald Balding, MD;  Location: [place];  Service: Orthopedics;  Laterality: Left;    There were no vitals filed for this visit.      Subjective Assessment - 12/21/16 0934    Subjective Doing well.  Wondering if the cane is too low today.     Pain Score 2    Pain Location Knee   Pain Orientation Left                         [facility] Adult PT Treatment/Exercise - 12/21/16 0001      Knee/Hip  Exercises: Stretches   Passive Hamstring Stretch Left;2 reps;30 seconds   Knee: Self-Stretch to increase Flexion 5 reps;10 seconds  red ball     Knee/Hip Exercises: Aerobic   Nustep L3x6min     Knee/Hip Exercises: Standing   Forward Step Up Hand Hold: 2;10 reps;Step Height: 4"     Knee/Hip Exercises: Seated   Long Arc Quad 2 sets;10 reps;Weights   Long Arc Quad Weight 2 lbs.     Knee/Hip Exercises: Supine   Straight Leg Raises 2 sets;10 reps   Straight Leg Raises Limitations 2#     Modalities   Modalities Cryotherapy     Cryotherapy   Number Minutes Cryotherapy 15 Minutes  vaso not available   Cryotherapy Location Knee   Type of Cryotherapy Ice pack     Manual Therapy   Manual therapy comments PROM work into flexion and extensio to tolerance   Joint Mobilization Patella mobs   Soft tissue mobilization Lt quads/hams/peripatella                  PT Short Term Goals - 12/17/16 1118      PT SHORT TERM GOAL #1   Title be  independent in initial HEP   Time 4   Status New     PT SHORT TERM GOAL #2   Title demonstrate Lt knee A/ROM flexion to > or = to 95 degrees to imrpove sitting and descending steps   Time 4   Period Weeks   Status New     PT SHORT TERM GOAL #3   Title report < or = to 3/10 Lt knee pain with standing and walking   Time 4   Period Weeks   Status New     PT SHORT TERM GOAL #4   Title improve strength and endurance to stand and walk > or = to 25 minutes to improve community ambulation   Time 4   Period Weeks   Status New           PT Long Term Goals - 12/17/16 1118      PT LONG TERM GOAL #1   Title be independent in advanced HEP   Time 8   Period Weeks   Status New     PT LONG TERM GOAL #2   Title reduce FOTO to < or = to 42% limitation   Time 8   Period Weeks   Status New     PT LONG TERM GOAL #3   Title demonstrate Lt knee A/ROM flexion to > or = to 115 degrees to allow for squatting and descending steps without  substitution   Time 8   Period Weeks   Status New     PT LONG TERM GOAL #4   Title improve Lt knee strength and flexibility to ascend and descend steps with step-over-step gait with use of 1 rail   Time 8   Period Weeks   Status New     PT LONG TERM GOAL #5   Title demonstrate 5/5 Lt knee strength with good quad set to improve safety and endurance in the community   Time 8   Period Weeks   Status New     Additional Long Term Goals   Additional Long Term Goals Yes     PT LONG TERM GOAL #6   Title improve strength and endurance to ambulate for 30-45 minutes without limitation   Time 8   Period Weeks   Status New               Plan - 12/21/16 1128    Clinical Impression Statement Pt tolerated all well.  Needed some cueing to improve knee flexion during step up.  Discussed keeping R shoulder relaxed to decrease the feeling of the cane being too short as she was hiking it to her ear.     PT Treatment/Interventions Cryotherapy;ADLs/Self Care Home Management;Functional mobility training;Stair training;Gait training;Ultrasound;Moist Heat;Therapeutic activities;Therapeutic exercise;Balance training;Neuromuscular re-education;Manual techniques;Vasopneumatic Device;Taping   PT Next Visit Plan scar and patellar mobs, soft tissue to distal hamstrings and quads, A/ROM, gait, game ready      Patient will benefit from skilled therapeutic intervention in order to improve the following deficits and impairments:  Abnormal gait, Decreased range of motion, Pain, Increased edema, Decreased strength, Decreased scar mobility, Impaired flexibility, Decreased activity tolerance, Decreased endurance  Visit Diagnosis: Acute pain of left knee  Other abnormalities of gait and mobility  Localized edema  Stiffness of left knee, not elsewhere classified  Muscle weakness (generalized)     Problem List Patient Active Problem List   Diagnosis Date Noted  . S/P total knee replacement using  cement, left 11/20/2016  . Osteopenia  02/28/2015  . Left lumbar radiculitis 02/28/2015  . Acute blood loss anemia 09/20/2014  . Primary osteoarthritis of right knee 09/14/2014  . Osteoarthritis of left knee 09/14/2014  . Vitamin B 12 deficiency 07/29/2014  . Obesity (BMI 30-39.9) 12/08/2013  . Osteoarthritis, knee 05/30/2012  . Metabolic syndrome 36/43/8377  . DVT, HX OF 02/10/2010  . Vitamin D deficiency 08/25/2007  . Disorder of bone and cartilage 04/08/2007  . Hypothyroidism 11/18/2006  . ANEMIA-NOS 11/18/2006  . Essential hypertension 11/18/2006  . GERD 11/18/2006    Stark Bray, DPT, CMP 12/21/2016, 11:32 AM  [facility] [address] [place], [place], [address] Phone: [phone]   Fax:  [phone]  Name: Kimberly [name] MRN: [id number] Date of Birth: 24-Dec-1950

## 2016-12-24 ENCOUNTER — Encounter: Payer: Self-pay | Admitting: Physical Therapy

## 2016-12-24 ENCOUNTER — Ambulatory Visit: Payer: Medicare Other | Admitting: Physical Therapy

## 2016-12-24 DIAGNOSIS — G8929 Other chronic pain: Secondary | ICD-10-CM | POA: Diagnosis not present

## 2016-12-24 DIAGNOSIS — M25662 Stiffness of left knee, not elsewhere classified: Secondary | ICD-10-CM | POA: Diagnosis not present

## 2016-12-24 DIAGNOSIS — M6281 Muscle weakness (generalized): Secondary | ICD-10-CM | POA: Diagnosis not present

## 2016-12-24 DIAGNOSIS — R600 Localized edema: Secondary | ICD-10-CM

## 2016-12-24 DIAGNOSIS — M25562 Pain in left knee: Secondary | ICD-10-CM

## 2016-12-24 DIAGNOSIS — R2689 Other abnormalities of gait and mobility: Secondary | ICD-10-CM | POA: Diagnosis not present

## 2016-12-24 NOTE — Therapy (Signed)
[facility] [address] [place], [place], [address] Phone: [phone]   Fax:  [phone]  Physical Therapy Treatment  Patient Details  Name: Kimberly [name] MRN: [id number] Date of Birth: 04-25-1951 Referring Provider: [name], MD  Encounter Date: 12/24/2016      PT End of Session - 12/24/16 1008    Visit Number 4   Number of Visits 10   Date for PT Re-Evaluation 02/11/17   PT Start Time 0915   PT Stop Time 1025   PT Time Calculation (min) 70 min   Activity Tolerance Patient tolerated treatment well   Behavior During Therapy [facility] for tasks assessed/performed      Past Medical History:  Diagnosis Date  . Anemia 2005   hb 11.5  . Arthritis   . DVT (deep venous thrombosis) ([place])    2003, coumadin x 6 mon  . GERD (gastroesophageal reflux disease) 2001   no problem now  . Hypertension   . Hypothyroidism    off meds  . Neuromuscular disorder ([place])    painful in L foot, being started on Gabapentin- 11/08/2016  . Osteopenia   . Vegetarian diet    eats fish and eggs; no meat    Past Surgical History:  Procedure Laterality Date  . JOINT REPLACEMENT    . KNEE ARTHROSCOPY Right 03/16/2003  . OOPHORECTOMY     "think it was my left"  . TOTAL KNEE ARTHROPLASTY Right 09/14/2014   Procedure: RIGHT TOTAL KNEE ARTHROPLASTY;  Surgeon: Garald Balding, MD;  Location: [place];  Service: Orthopedics;  Laterality: Right;  . TOTAL KNEE ARTHROPLASTY Left 11/20/2016  . TOTAL KNEE ARTHROPLASTY Left 11/20/2016   Procedure: LEFT TOTAL KNEE ARTHROPLASTY;  Surgeon: Garald Balding, MD;  Location: [place];  Service: Orthopedics;  Laterality: Left;    There were no vitals filed for this visit.      Subjective Assessment - 12/24/16 0938    Subjective I got around on my bike at home for 2 min.    Currently in Pain? Yes   Pain Score 1    Pain Location Knee   Pain Orientation Left   Pain Descriptors / Indicators Dull   Multiple Pain Sites No            [facility] PT Assessment - 12/24/16 0001      AROM   Left Knee Flexion 90                     [facility] Adult PT Treatment/Exercise - 12/24/16 0001      Knee/Hip Exercises: Stretches   Knee: Self-Stretch to increase Flexion 5 reps;10 seconds  red ball   Gastroc Stretch Both;3 reps;20 seconds     Knee/Hip Exercises: Aerobic   Recumbent Bike ROM only x 2 min   Nustep L3 x 15 min     Knee/Hip Exercises: Standing   Forward Step Up Left;1 set;10 reps;Step Height: 2";Step Height: 6"  VC to trust quad, increase weight bearing into LTLE     Knee/Hip Exercises: Seated   Long Arc Quad 2 sets;10 reps;Weights   Long Arc Quad Weight 3 lbs.     Vasopneumatic   Number Minutes Vasopneumatic  15 minutes   Vasopnuematic Location  Knee   Vasopneumatic Pressure Medium   Vasopneumatic Temperature  3 flakes     Manual Therapy   Joint Mobilization Patella mobs   Soft tissue mobilization Lt quads/hams/peripatella   Myofascial Release proximal scar/tendon  massage                  PT Short Term Goals - 12/24/16 1014      PT SHORT TERM GOAL #1   Title be independent in initial HEP   Time 4   Period Weeks   Status Achieved     PT SHORT TERM GOAL #2   Title demonstrate Lt knee A/ROM flexion to > or = to 95 degrees to imrpove sitting and descending steps   Time 4   Period Weeks   Status On-going  Pt met 90 degrees today.     PT SHORT TERM GOAL #3   Title report < or = to 3/10 Lt knee pain with standing and walking   Time 4   Period Weeks   Status Achieved           PT Long Term Goals - 12/17/16 1118      PT LONG TERM GOAL #1   Title be independent in advanced HEP   Time 8   Period Weeks   Status New     PT LONG TERM GOAL #2   Title reduce FOTO to < or = to 42% limitation   Time 8   Period Weeks   Status New     PT LONG TERM GOAL #3   Title demonstrate Lt knee A/ROM flexion to > or = to 115 degrees to allow for  squatting and descending steps without substitution   Time 8   Period Weeks   Status New     PT LONG TERM GOAL #4   Title improve Lt knee strength and flexibility to ascend and descend steps with step-over-step gait with use of 1 rail   Time 8   Period Weeks   Status New     PT LONG TERM GOAL #5   Title demonstrate 5/5 Lt knee strength with good quad set to improve safety and endurance in the community   Time 8   Period Weeks   Status New     Additional Long Term Goals   Additional Long Term Goals Yes     PT LONG TERM GOAL #6   Title improve strength and endurance to ambulate for 30-45 minutes without limitation   Time 8   Period Weeks   Status New               Plan - 12/24/16 1009    Clinical Impression Statement Knee flexion is slowly improving, edema is also slowly improving. Pt reports she constantly battles stiffness and uses her exercises to help with this.  She is ambulating without the cane this AM has a mild limp. Pt is hesitant to put her weight  into the LTLE and fully contract the quad for full knee extension. This showewd improvement doing the step up exercise.  Pt met 2/4 short term goals today.    Rehab Potential Good   PT Frequency 3x / week   PT Duration 8 weeks   PT Treatment/Interventions Cryotherapy;ADLs/Self Care Home Management;Functional mobility training;Stair training;Gait training;Ultrasound;Moist Heat;Therapeutic activities;Therapeutic exercise;Balance training;Neuromuscular re-education;Manual techniques;Vasopneumatic Device;Taping   PT Next Visit Plan scar and patellar mobs, soft tissue to distal hamstrings and quads, A/ROM, gait, game ready   Consulted and Agree with Plan of Care Patient      Patient will benefit from skilled therapeutic intervention in order to improve the following deficits and impairments:  Abnormal gait, Decreased range of motion, Pain, Increased edema, Decreased strength, Decreased  scar mobility, Impaired flexibility,  Decreased activity tolerance, Decreased endurance  Visit Diagnosis: Acute pain of left knee  Other abnormalities of gait and mobility  Localized edema  Stiffness of left knee, not elsewhere classified  Muscle weakness (generalized)     Problem List Patient Active Problem List   Diagnosis Date Noted  . S/P total knee replacement using cement, left 11/20/2016  . Osteopenia 02/28/2015  . Left lumbar radiculitis 02/28/2015  . Acute blood loss anemia 09/20/2014  . Primary osteoarthritis of right knee 09/14/2014  . Osteoarthritis of left knee 09/14/2014  . Vitamin B 12 deficiency 07/29/2014  . Obesity (BMI 30-39.9) 12/08/2013  . Osteoarthritis, knee 05/30/2012  . Metabolic syndrome 68/37/2902  . DVT, HX OF 02/10/2010  . Vitamin D deficiency 08/25/2007  . Disorder of bone and cartilage 04/08/2007  . Hypothyroidism 11/18/2006  . ANEMIA-NOS 11/18/2006  . Essential hypertension 11/18/2006  . GERD 11/18/2006    Luisenrique Conran, PTA 12/24/2016, 10:17 AM  [facility] [address] [place], [place], [address] Phone: [phone]   Fax:  [phone]  Name: Anesa Fronek MRN: [id number] Date of Birth: 1950-09-12

## 2016-12-26 ENCOUNTER — Encounter: Payer: Self-pay | Admitting: Physical Therapy

## 2016-12-26 ENCOUNTER — Ambulatory Visit: Payer: Medicare Other | Admitting: Physical Therapy

## 2016-12-26 DIAGNOSIS — M25662 Stiffness of left knee, not elsewhere classified: Secondary | ICD-10-CM | POA: Diagnosis not present

## 2016-12-26 DIAGNOSIS — R600 Localized edema: Secondary | ICD-10-CM

## 2016-12-26 DIAGNOSIS — R2689 Other abnormalities of gait and mobility: Secondary | ICD-10-CM | POA: Diagnosis not present

## 2016-12-26 DIAGNOSIS — M6281 Muscle weakness (generalized): Secondary | ICD-10-CM | POA: Diagnosis not present

## 2016-12-26 DIAGNOSIS — M25562 Pain in left knee: Secondary | ICD-10-CM | POA: Diagnosis not present

## 2016-12-26 DIAGNOSIS — G8929 Other chronic pain: Secondary | ICD-10-CM | POA: Diagnosis not present

## 2016-12-26 NOTE — Therapy (Signed)
[facility] [address] [place], [place], [address] Phone: [phone]   Fax:  [phone]  Physical Therapy Treatment  Patient Details  Name: Kimberly [name] MRN: [id number] Date of Birth: June 11, 1950 Referring Provider: [name], MD  Encounter Date: 12/26/2016      PT End of Session - 12/26/16 0847    Visit Number 5   Number of Visits 10   Date for PT Re-Evaluation 02/11/17   PT Start Time 0846   PT Stop Time 0940   PT Time Calculation (min) 54 min   Activity Tolerance Patient tolerated treatment well;Patient limited by pain   Behavior During Therapy [facility] for tasks assessed/performed      Past Medical History:  Diagnosis Date  . Anemia 2005   hb 11.5  . Arthritis   . DVT (deep venous thrombosis) ([place])    2003, coumadin x 6 mon  . GERD (gastroesophageal reflux disease) 2001   no problem now  . Hypertension   . Hypothyroidism    off meds  . Neuromuscular disorder ([place])    painful in L foot, being started on Gabapentin- 11/08/2016  . Osteopenia   . Vegetarian diet    eats fish and eggs; no meat    Past Surgical History:  Procedure Laterality Date  . JOINT REPLACEMENT    . KNEE ARTHROSCOPY Right 03/16/2003  . OOPHORECTOMY     "think it was my left"  . TOTAL KNEE ARTHROPLASTY Right 09/14/2014   Procedure: RIGHT TOTAL KNEE ARTHROPLASTY;  Surgeon: Garald Balding, MD;  Location: [place];  Service: Orthopedics;  Laterality: Right;  . TOTAL KNEE ARTHROPLASTY Left 11/20/2016  . TOTAL KNEE ARTHROPLASTY Left 11/20/2016   Procedure: LEFT TOTAL KNEE ARTHROPLASTY;  Surgeon: Garald Balding, MD;  Location: [place];  Service: Orthopedics;  Laterality: Left;    There were no vitals filed for this visit.      Subjective Assessment - 12/26/16 0849    Subjective I pushed myself on my bike yesterday and my quad has been so sore. Took Advil but I am still sore.    Currently in Pain? Yes   Pain Score 4    Pain  Location --  Lt quad   Pain Orientation Left   Pain Descriptors / Indicators Sore   Aggravating Factors  Joint is ok, but muscle is sore from overworking   Pain Relieving Factors ice, moving it   Multiple Pain Sites No                         [facility] Adult PT Treatment/Exercise - 12/26/16 0001      Knee/Hip Exercises: Stretches   Knee: Self-Stretch to increase Flexion --  20x in supine on red ball     Knee/Hip Exercises: Aerobic   Nustep L1 x 5 min  Modified due to increased quad soreness that was not lesseni     Knee/Hip Exercises: Prone   Hamstring Curl --  20x     Cryotherapy   Number Minutes Cryotherapy 15 Minutes     Vasopneumatic   Number Minutes Vasopneumatic  15 minutes   Vasopnuematic Location  Knee   Vasopneumatic Pressure Medium   Vasopneumatic Temperature  3 flakes     Manual Therapy   Manual Therapy Muscle Energy Technique   Manual therapy comments PROM work into flexion and extensio to tolerance   Joint Mobilization Patella mobs   Soft tissue mobilization Lt quads/hams/peripatella  Muscle Energy Technique Contract relax for knee flexion in prone                  PT Short Term Goals - 12/24/16 1014      PT SHORT TERM GOAL #1   Title be independent in initial HEP   Time 4   Period Weeks   Status Achieved     PT SHORT TERM GOAL #2   Title demonstrate Lt knee A/ROM flexion to > or = to 95 degrees to imrpove sitting and descending steps   Time 4   Period Weeks   Status On-going  Pt met 90 degrees today.     PT SHORT TERM GOAL #3   Title report < or = to 3/10 Lt knee pain with standing and walking   Time 4   Period Weeks   Status Achieved           PT Long Term Goals - 12/17/16 1118      PT LONG TERM GOAL #1   Title be independent in advanced HEP   Time 8   Period Weeks   Status New     PT LONG TERM GOAL #2   Title reduce FOTO to < or = to 42% limitation   Time 8   Period Weeks   Status New     PT LONG  TERM GOAL #3   Title demonstrate Lt knee A/ROM flexion to > or = to 115 degrees to allow for squatting and descending steps without substitution   Time 8   Period Weeks   Status New     PT LONG TERM GOAL #4   Title improve Lt knee strength and flexibility to ascend and descend steps with step-over-step gait with use of 1 rail   Time 8   Period Weeks   Status New     PT LONG TERM GOAL #5   Title demonstrate 5/5 Lt knee strength with good quad set to improve safety and endurance in the community   Time 8   Period Weeks   Status New     Additional Long Term Goals   Additional Long Term Goals Yes     PT LONG TERM GOAL #6   Title improve strength and endurance to ambulate for 30-45 minutes without limitation   Time 8   Period Weeks   Status New               Plan - 12/26/16 0848    Clinical Impression Statement Pt reports yesterday she decided to ride her stationary bike very hard and got very sore in her quad after this. The knee was quite stiff today despite measuring still around 90 degrees.     Rehab Potential Good   PT Frequency 3x / week   PT Duration 8 weeks   PT Treatment/Interventions Cryotherapy;ADLs/Self Care Home Management;Functional mobility training;Stair training;Gait training;Ultrasound;Moist Heat;Therapeutic activities;Therapeutic exercise;Balance training;Neuromuscular re-education;Manual techniques;Vasopneumatic Device;Taping   PT Next Visit Plan scar and patellar mobs, soft tissue to distal hamstrings and quads, A/ROM, gait, game ready   Consulted and Agree with Plan of Care Patient      Patient will benefit from skilled therapeutic intervention in order to improve the following deficits and impairments:  Abnormal gait, Decreased range of motion, Pain, Increased edema, Decreased strength, Decreased scar mobility, Impaired flexibility, Decreased activity tolerance, Decreased endurance  Visit Diagnosis: Acute pain of left knee  Other abnormalities of  gait and mobility  Localized edema  Stiffness  of left knee, not elsewhere classified     Problem List Patient Active Problem List   Diagnosis Date Noted  . S/P total knee replacement using cement, left 11/20/2016  . Osteopenia 02/28/2015  . Left lumbar radiculitis 02/28/2015  . Acute blood loss anemia 09/20/2014  . Primary osteoarthritis of right knee 09/14/2014  . Osteoarthritis of left knee 09/14/2014  . Vitamin B 12 deficiency 07/29/2014  . Obesity (BMI 30-39.9) 12/08/2013  . Osteoarthritis, knee 05/30/2012  . Metabolic syndrome 40/76/8088  . DVT, HX OF 02/10/2010  . Vitamin D deficiency 08/25/2007  . Disorder of bone and cartilage 04/08/2007  . Hypothyroidism 11/18/2006  . ANEMIA-NOS 11/18/2006  . Essential hypertension 11/18/2006  . GERD 11/18/2006    Anuradha Chabot, PTA 12/26/2016, 9:31 AM  [facility] [address] [place], [place], [address] Phone: [phone]   Fax:  [phone]  Name: Kimberly [name] MRN: [id number] Date of Birth: 02-15-1951

## 2016-12-28 ENCOUNTER — Ambulatory Visit: Payer: Medicare Other | Admitting: Physical Therapy

## 2016-12-28 DIAGNOSIS — M6281 Muscle weakness (generalized): Secondary | ICD-10-CM

## 2016-12-28 DIAGNOSIS — R600 Localized edema: Secondary | ICD-10-CM | POA: Diagnosis not present

## 2016-12-28 DIAGNOSIS — M25562 Pain in left knee: Secondary | ICD-10-CM | POA: Diagnosis not present

## 2016-12-28 DIAGNOSIS — G8929 Other chronic pain: Secondary | ICD-10-CM | POA: Diagnosis not present

## 2016-12-28 DIAGNOSIS — M25662 Stiffness of left knee, not elsewhere classified: Secondary | ICD-10-CM | POA: Diagnosis not present

## 2016-12-28 DIAGNOSIS — R2689 Other abnormalities of gait and mobility: Secondary | ICD-10-CM | POA: Diagnosis not present

## 2016-12-28 NOTE — Therapy (Signed)
[facility] [address] [place], [place], [address] Phone: [phone]   Fax:  [phone]  Physical Therapy Treatment  Patient Details  Name: Kimberly [name] MRN: [id number] Date of Birth: [birth year] Referring Provider: [name], MD  Encounter Date: 12/28/2016      PT End of Session - 12/28/16 1046    Visit Number 6   Number of Visits 10   Date for PT Re-Evaluation 02/11/17   PT Start Time 1008   PT Stop Time 1103   PT Time Calculation (min) 55 min   Activity Tolerance Patient tolerated treatment well      Past Medical History:  Diagnosis Date  . Anemia 2005   hb 11.5  . Arthritis   . DVT (deep venous thrombosis) ([place])    2003, coumadin x 6 mon  . GERD (gastroesophageal reflux disease) 2001   no problem now  . Hypertension   . Hypothyroidism    off meds  . Neuromuscular disorder ([place])    painful in L foot, being started on Gabapentin- 11/08/2016  . Osteopenia   . Vegetarian diet    eats fish and eggs; no meat    Past Surgical History:  Procedure Laterality Date  . JOINT REPLACEMENT    . KNEE ARTHROSCOPY Right 03/16/2003  . OOPHORECTOMY     "think it was my left"  . TOTAL KNEE ARTHROPLASTY Right 09/14/2014   Procedure: RIGHT TOTAL KNEE ARTHROPLASTY;  Surgeon: Garald Balding, MD;  Location: [place];  Service: Orthopedics;  Laterality: Right;  . TOTAL KNEE ARTHROPLASTY Left 11/20/2016  . TOTAL KNEE ARTHROPLASTY Left 11/20/2016   Procedure: LEFT TOTAL KNEE ARTHROPLASTY;  Surgeon: Garald Balding, MD;  Location: [place];  Service: Orthopedics;  Laterality: Left;    There were no vitals filed for this visit.      Subjective Assessment - 12/28/16 1012    Subjective Less sore today from overdoing it on home bike.     Currently in Pain? Yes   Pain Score 2    Pain Location Knee   Pain Orientation Left   Pain Onset 1 to 4 weeks ago   Pain Frequency Constant            [facility] PT  Assessment - 12/28/16 0001      AROM   Left Knee Extension 4   Left Knee Flexion 93  95 seated                      [facility] Adult PT Treatment/Exercise - 12/28/16 0001      Therapeutic Activites    Therapeutic Activities ADL's   ADL's sit to stand, standing, walking     Knee/Hip Exercises: Stretches   Active Hamstring Stretch Left;3 reps   Active Hamstring Stretch Limitations on 2nd step   Knee: Self-Stretch to increase Flexion Left;5 reps;30 seconds   Knee: Self-Stretch Limitations on 2nd step     Knee/Hip Exercises: Aerobic   Nustep L1 12 min      Knee/Hip Exercises: Standing   Forward Step Up Left;1 set;10 reps;Hand Hold: 2;Step Height: 6"   SLS with Vectors 3 ways 10x weight on left   Other Standing Knee Exercises weightbearing on left with step taps 10x     Knee/Hip Exercises: Seated   Long Arc Quad 2 sets;10 reps;Weights   Long Arc Quad Weight 3 lbs.   Sit to Sand 10 reps;without UE support  high mat table  Knee/Hip Exercises: Supine   Other Supine Knee/Hip Exercises green ball roll for flexion 2x 10   Other Supine Knee/Hip Exercises HS sets on green ball 10x     Vasopneumatic   Number Minutes Vasopneumatic  15 minutes   Vasopnuematic Location  Knee   Vasopneumatic Pressure Medium   Vasopneumatic Temperature  3 flakes     Manual Therapy   Manual therapy comments seated and supine knee flexion with overpressure 15x each   Joint Mobilization Patella mobs   Soft tissue mobilization Lt quads/hams/peripatella                  PT Short Term Goals - 12/28/16 1050      PT SHORT TERM GOAL #1   Title be independent in initial HEP   Status Achieved     PT SHORT TERM GOAL #2   Title demonstrate Lt knee A/ROM flexion to > or = to 95 degrees to improve sitting and descending steps   Time 4   Period Weeks   Status Achieved     PT SHORT TERM GOAL #3   Title report < or = to 3/10 Lt knee pain with standing and walking   Status Achieved      PT SHORT TERM GOAL #4   Title improve strength and endurance to stand and walk > or = to 25 minutes to improve community ambulation   Time 4   Period Weeks   Status On-going           PT Long Term Goals - 12/28/16 1051      PT LONG TERM GOAL #1   Title be independent in advanced HEP   Time 8   Period Weeks   Status On-going     PT LONG TERM GOAL #2   Title reduce FOTO to < or = to 42% limitation   Time 8   Period Weeks   Status On-going     PT LONG TERM GOAL #3   Title demonstrate Lt knee A/ROM flexion to > or = to 115 degrees to allow for squatting and descending steps without substitution   Time 8   Period Weeks   Status On-going     PT LONG TERM GOAL #4   Title improve Lt knee strength and flexibility to ascend and descend steps with step-over-step gait with use of 1 rail   Time 8   Period Weeks   Status On-going     PT LONG TERM GOAL #5   Title demonstrate 5/5 Lt knee strength with good quad set to improve safety and endurance in the community   Time 8   Period Weeks   Status On-going     PT LONG TERM GOAL #6   Title improve strength and endurance to ambulate for 30-45 minutes without limitation   Time 8   Period Weeks   Status On-going               Plan - 12/28/16 1047    Clinical Impression Statement The patient continues to make gains in left knee ROM for both flexion and extension.  Improving quad muscle activation as well although still limited with terminal knee extension.  Muscle fatigue with weight bearing.  The patient's goal is travel to [place] in December.     Rehab Potential Good   PT Frequency 3x / week   PT Duration 8 weeks   PT Treatment/Interventions Cryotherapy;ADLs/Self Care Home Management;Functional mobility training;Stair training;Gait training;Ultrasound;Moist Heat;Therapeutic activities;Therapeutic exercise;Balance  training;Neuromuscular re-education;Manual techniques;Vasopneumatic Device;Taping   PT Next Visit Plan scar  and patellar mobs, soft tissue to distal hamstrings and quads, A/ROM especially flexion,quad motor control;   gait, game ready; assess walking endurance for STG      Patient will benefit from skilled therapeutic intervention in order to improve the following deficits and impairments:  Abnormal gait, Decreased range of motion, Pain, Increased edema, Decreased strength, Decreased scar mobility, Impaired flexibility, Decreased activity tolerance, Decreased endurance  Visit Diagnosis: Acute pain of left knee  Other abnormalities of gait and mobility  Localized edema  Stiffness of left knee, not elsewhere classified  Muscle weakness (generalized)     Problem List Patient Active Problem List   Diagnosis Date Noted  . S/P total knee replacement using cement, left 11/20/2016  . Osteopenia 02/28/2015  . Left lumbar radiculitis 02/28/2015  . Acute blood loss anemia 09/20/2014  . Primary osteoarthritis of right knee 09/14/2014  . Osteoarthritis of left knee 09/14/2014  . Vitamin B 12 deficiency 07/29/2014  . Obesity (BMI 30-39.9) 12/08/2013  . Osteoarthritis, knee 05/30/2012  . Metabolic syndrome 86/57/8469  . DVT, HX OF 02/10/2010  . Vitamin D deficiency 08/25/2007  . Disorder of bone and cartilage 04/08/2007  . Hypothyroidism 11/18/2006  . ANEMIA-NOS 11/18/2006  . Essential hypertension 11/18/2006  . GERD 11/18/2006   Ruben Im, PT 12/28/16 10:54 AM Phone: [phone] Fax: [phone]  Alvera Singh 12/28/2016, 10:53 AM  [facility] [address] [place], [place], [address] Phone: [phone]   Fax:  [phone]  Name: Kimberly [name] MRN: [id number] Date of Birth: Nov 19, 1950

## 2016-12-31 ENCOUNTER — Encounter: Payer: Self-pay | Admitting: Physical Therapy

## 2016-12-31 ENCOUNTER — Ambulatory Visit: Payer: Medicare Other | Admitting: Physical Therapy

## 2016-12-31 DIAGNOSIS — M25562 Pain in left knee: Secondary | ICD-10-CM | POA: Diagnosis not present

## 2016-12-31 DIAGNOSIS — R600 Localized edema: Secondary | ICD-10-CM

## 2016-12-31 DIAGNOSIS — R2689 Other abnormalities of gait and mobility: Secondary | ICD-10-CM | POA: Diagnosis not present

## 2016-12-31 DIAGNOSIS — G8929 Other chronic pain: Secondary | ICD-10-CM

## 2016-12-31 DIAGNOSIS — M25662 Stiffness of left knee, not elsewhere classified: Secondary | ICD-10-CM | POA: Diagnosis not present

## 2016-12-31 DIAGNOSIS — M6281 Muscle weakness (generalized): Secondary | ICD-10-CM | POA: Diagnosis not present

## 2016-12-31 NOTE — Therapy (Signed)
[facility] [address] [place], [place], [address] Phone: [phone]   Fax:  [phone]  Physical Therapy Treatment  Patient Details  Name: Kimberly [name] MRN: [id number] Date of Birth: 1951/02/22 Referring Provider: [name], MD  Encounter Date: 12/31/2016      PT End of Session - 12/31/16 0840    Visit Number 7   Number of Visits 10   Date for PT Re-Evaluation 02/11/17   PT Start Time 0840   PT Stop Time 0940   PT Time Calculation (min) 60 min   Activity Tolerance Patient tolerated treatment well   Behavior During Therapy [facility] for tasks assessed/performed      Past Medical History:  Diagnosis Date  . Anemia 2005   hb 11.5  . Arthritis   . DVT (deep venous thrombosis) ([place])    2003, coumadin x 6 mon  . GERD (gastroesophageal reflux disease) 2001   no problem now  . Hypertension   . Hypothyroidism    off meds  . Neuromuscular disorder ([place])    painful in L foot, being started on Gabapentin- 11/08/2016  . Osteopenia   . Vegetarian diet    eats fish and eggs; no meat    Past Surgical History:  Procedure Laterality Date  . JOINT REPLACEMENT    . KNEE ARTHROSCOPY Right 03/16/2003  . OOPHORECTOMY     "think it was my left"  . TOTAL KNEE ARTHROPLASTY Right 09/14/2014   Procedure: RIGHT TOTAL KNEE ARTHROPLASTY;  Surgeon: Garald Balding, MD;  Location: [place];  Service: Orthopedics;  Laterality: Right;  . TOTAL KNEE ARTHROPLASTY Left 11/20/2016  . TOTAL KNEE ARTHROPLASTY Left 11/20/2016   Procedure: LEFT TOTAL KNEE ARTHROPLASTY;  Surgeon: Garald Balding, MD;  Location: [place];  Service: Orthopedics;  Laterality: Left;    There were no vitals filed for this visit.      Subjective Assessment - 12/31/16 0840    Subjective I feel like I have more flexibility in my knee today.    Pertinent History Rt TKA 2016   Currently in Pain? Yes   Pain Score 1    Pain Location Knee   Pain Orientation  Left   Pain Descriptors / Indicators Dull   Aggravating Factors  Overdoing it   Pain Relieving Factors ice, stretching            [facility] PT Assessment - 12/31/16 0001      AROM   Left Knee Flexion 95     PROM   Left Knee Flexion 98  supine                     [facility] Adult PT Treatment/Exercise - 12/31/16 0001      Knee/Hip Exercises: Stretches   Knee: Self-Stretch to increase Flexion Left;5 reps;30 seconds  Rocking prior to static holds   Knee: Self-Stretch Limitations on 2nd step     Knee/Hip Exercises: Aerobic   Recumbent Bike Post manual 5 min full forward   Nustep L1 12 min      Knee/Hip Exercises: Seated   Long Arc Quad Strengthening;Left;2 sets;10 reps;Weights   Long Arc Quad Weight 3 lbs.   Sit to Sand 10 reps;without UE support     Knee/Hip Exercises: Prone   Hamstring Curl --  20x, PTA manually stabilized pelvis      Vasopneumatic   Number Minutes Vasopneumatic  15 minutes   Vasopnuematic Location  Knee   Vasopneumatic Pressure  Medium   Vasopneumatic Temperature  3 flakes     Manual Therapy   Manual therapy comments PROM work into flexion and extensio to tolerance   Joint Mobilization Patella mobs   Soft tissue mobilization Lt quads/hams/peripatella                  PT Short Term Goals - 12/28/16 1050      PT SHORT TERM GOAL #1   Title be independent in initial HEP   Status Achieved     PT SHORT TERM GOAL #2   Title demonstrate Lt knee A/ROM flexion to > or = to 95 degrees to improve sitting and descending steps   Time 4   Period Weeks   Status Achieved     PT SHORT TERM GOAL #3   Title report < or = to 3/10 Lt knee pain with standing and walking   Status Achieved     PT SHORT TERM GOAL #4   Title improve strength and endurance to stand and walk > or = to 25 minutes to improve community ambulation   Time 4   Period Weeks   Status On-going           PT Long Term Goals - 12/28/16 1051      PT LONG TERM GOAL  #1   Title be independent in advanced HEP   Time 8   Period Weeks   Status On-going     PT LONG TERM GOAL #2   Title reduce FOTO to < or = to 42% limitation   Time 8   Period Weeks   Status On-going     PT LONG TERM GOAL #3   Title demonstrate Lt knee A/ROM flexion to > or = to 115 degrees to allow for squatting and descending steps without substitution   Time 8   Period Weeks   Status On-going     PT LONG TERM GOAL #4   Title improve Lt knee strength and flexibility to ascend and descend steps with step-over-step gait with use of 1 rail   Time 8   Period Weeks   Status On-going     PT LONG TERM GOAL #5   Title demonstrate 5/5 Lt knee strength with good quad set to improve safety and endurance in the community   Time 8   Period Weeks   Status On-going     PT LONG TERM GOAL #6   Title improve strength and endurance to ambulate for 30-45 minutes without limitation   Time 8   Period Weeks   Status On-going               Plan - 12/31/16 0839    Clinical Impression Statement AROM stayed from Friday measurement. PROM for LT knee flexion improved as more motion is becoming avaiilable in the joint.  Pt demonstrated strong quad awarness during LAQ exercise but still require vc to not substitute at her pelvis on th ebike and in th eprone position.    PT Frequency 3x / week   PT Duration 8 weeks   PT Treatment/Interventions Cryotherapy;ADLs/Self Care Home Management;Functional mobility training;Stair training;Gait training;Ultrasound;Moist Heat;Therapeutic activities;Therapeutic exercise;Balance training;Neuromuscular re-education;Manual techniques;Vasopneumatic Device;Taping   PT Next Visit Plan scar and patellar mobs, soft tissue to distal hamstrings and quads, A/ROM especially flexion,quad motor control;   gait, game ready; assess walking endurance for STG      Patient will benefit from skilled therapeutic intervention in order to improve the following deficits and  impairments:  Abnormal gait, Decreased range of motion, Pain, Increased edema, Decreased strength, Decreased scar mobility, Impaired flexibility, Decreased activity tolerance, Decreased endurance  Visit Diagnosis: Acute pain of left knee  Other abnormalities of gait and mobility  Localized edema  Stiffness of left knee, not elsewhere classified  Chronic pain of left knee     Problem List Patient Active Problem List   Diagnosis Date Noted  . S/P total knee replacement using cement, left 11/20/2016  . Osteopenia 02/28/2015  . Left lumbar radiculitis 02/28/2015  . Acute blood loss anemia 09/20/2014  . Primary osteoarthritis of right knee 09/14/2014  . Osteoarthritis of left knee 09/14/2014  . Vitamin B 12 deficiency 07/29/2014  . Obesity (BMI 30-39.9) 12/08/2013  . Osteoarthritis, knee 05/30/2012  . Metabolic syndrome 54/65/6812  . DVT, HX OF 02/10/2010  . Vitamin D deficiency 08/25/2007  . Disorder of bone and cartilage 04/08/2007  . Hypothyroidism 11/18/2006  . ANEMIA-NOS 11/18/2006  . Essential hypertension 11/18/2006  . GERD 11/18/2006    Kimberly [name], PTA 12/31/2016, 9:26 AM  [facility] [address] [place], [place], [address] Phone: [phone]   Fax:  [phone]  Name: Kimberly [name] MRN: [id number] Date of Birth: August 17, 1950

## 2017-01-02 ENCOUNTER — Encounter: Payer: Medicare Other | Admitting: Physical Therapy

## 2017-01-03 ENCOUNTER — Ambulatory Visit: Payer: Medicare Other

## 2017-01-03 DIAGNOSIS — G8929 Other chronic pain: Secondary | ICD-10-CM | POA: Diagnosis not present

## 2017-01-03 DIAGNOSIS — M25662 Stiffness of left knee, not elsewhere classified: Secondary | ICD-10-CM | POA: Diagnosis not present

## 2017-01-03 DIAGNOSIS — M25562 Pain in left knee: Secondary | ICD-10-CM | POA: Diagnosis not present

## 2017-01-03 DIAGNOSIS — R2689 Other abnormalities of gait and mobility: Secondary | ICD-10-CM | POA: Diagnosis not present

## 2017-01-03 DIAGNOSIS — R600 Localized edema: Secondary | ICD-10-CM

## 2017-01-03 DIAGNOSIS — M6281 Muscle weakness (generalized): Secondary | ICD-10-CM | POA: Diagnosis not present

## 2017-01-03 NOTE — Therapy (Signed)
[facility] [address], [place], [place], [address] Phone: [phone]   Fax:  [phone]  Physical Therapy Treatment  Patient Details  Name: Kimberly [name] MRN: [id number] Date of Birth: 1951-02-09 Referring Provider: [name], MD  Encounter Date: 01/03/2017      PT End of Session - 01/03/17 0858    Visit Number 8   Number of Visits 10   Date for PT Re-Evaluation 02/11/17   PT Start Time 0845   PT Stop Time 0945   PT Time Calculation (min) 60 min   Activity Tolerance Patient tolerated treatment well   Behavior During Therapy [facility] for tasks assessed/performed      Past Medical History:  Diagnosis Date  . Anemia 2005   hb 11.5  . Arthritis   . DVT (deep venous thrombosis) ([place])    2003, coumadin x 6 mon  . GERD (gastroesophageal reflux disease) 2001   no problem now  . Hypertension   . Hypothyroidism    off meds  . Neuromuscular disorder ([place])    painful in L foot, being started on Gabapentin- 11/08/2016  . Osteopenia   . Vegetarian diet    eats fish and eggs; no meat    Past Surgical History:  Procedure Laterality Date  . JOINT REPLACEMENT    . KNEE ARTHROSCOPY Right 03/16/2003  . OOPHORECTOMY     "think it was my left"  . TOTAL KNEE ARTHROPLASTY Right 09/14/2014   Procedure: RIGHT TOTAL KNEE ARTHROPLASTY;  Surgeon: Garald Balding, MD;  Location: [place];  Service: Orthopedics;  Laterality: Right;  . TOTAL KNEE ARTHROPLASTY Left 11/20/2016  . TOTAL KNEE ARTHROPLASTY Left 11/20/2016   Procedure: LEFT TOTAL KNEE ARTHROPLASTY;  Surgeon: Garald Balding, MD;  Location: [place];  Service: Orthopedics;  Laterality: Left;    There were no vitals filed for this visit.      Subjective Assessment - 01/03/17 0852    Subjective Pt. noting knee feeling only slight discomfort today.     Patient Stated Goals improve Lt knee A/ROM, improve endurance, imrpove gait   Currently in Pain? Yes   Pain Score  1    Pain Location Knee   Pain Orientation Left   Pain Descriptors / Indicators Dull   Pain Type Surgical pain   Pain Onset 1 to 4 weeks ago   Pain Frequency Intermittent   Aggravating Factors  Overdoing it   Multiple Pain Sites No                         [facility] Adult PT Treatment/Exercise - 01/03/17 0901      Knee/Hip Exercises: Stretches   Passive Hamstring Stretch Left;2 reps;30 seconds   Passive Hamstring Stretch Limitations + GS with strap   Quad Stretch Left;2 reps;30 seconds   Quad Stretch Limitations with strap     Knee/Hip Exercises: Aerobic   Nustep L1 15 min      Knee/Hip Exercises: Standing   Knee Flexion Left;15 reps;Strengthening   Other Standing Knee Exercises Standing L knee flexion stretch into step 10" x 10 reps      Knee/Hip Exercises: Supine   Straight Leg Raises Left;15 reps   Straight Leg Raises Limitations 2#    Other Supine Knee/Hip Exercises L knee flexion stretch 5" x 10 reps     Vasopneumatic   Number Minutes Vasopneumatic  15 minutes   Vasopnuematic Location  Knee   Vasopneumatic Pressure Medium  Vasopneumatic Temperature  3 flakes     Manual Therapy   Manual Therapy Muscle Energy Technique;Joint mobilization   Manual therapy comments PROM work into flexion and extensio to tolerance   Joint Mobilization Patella mobs   Soft tissue mobilization Lt quads/hams   Muscle Energy Technique Contract relax stretch for knee flexion and extension in prone to improve ROM                  PT Short Term Goals - 12/28/16 1050      PT SHORT TERM GOAL #1   Title be independent in initial HEP   Status Achieved     PT SHORT TERM GOAL #2   Title demonstrate Lt knee A/ROM flexion to > or = to 95 degrees to improve sitting and descending steps   Time 4   Period Weeks   Status Achieved     PT SHORT TERM GOAL #3   Title report < or = to 3/10 Lt knee pain with standing and walking   Status Achieved     PT SHORT TERM GOAL #4    Title improve strength and endurance to stand and walk > or = to 25 minutes to improve community ambulation   Time 4   Period Weeks   Status On-going           PT Long Term Goals - 12/28/16 1051      PT LONG TERM GOAL #1   Title be independent in advanced HEP   Time 8   Period Weeks   Status On-going     PT LONG TERM GOAL #2   Title reduce FOTO to < or = to 42% limitation   Time 8   Period Weeks   Status On-going     PT LONG TERM GOAL #3   Title demonstrate Lt knee A/ROM flexion to > or = to 115 degrees to allow for squatting and descending steps without substitution   Time 8   Period Weeks   Status On-going     PT LONG TERM GOAL #4   Title improve Lt knee strength and flexibility to ascend and descend steps with step-over-step gait with use of 1 rail   Time 8   Period Weeks   Status On-going     PT LONG TERM GOAL #5   Title demonstrate 5/5 Lt knee strength with good quad set to improve safety and endurance in the community   Time 8   Period Weeks   Status On-going     PT LONG TERM GOAL #6   Title improve strength and endurance to ambulate for 30-45 minutes without limitation   Time 8   Period Weeks   Status On-going               Plan - 01/03/17 0859    Clinical Impression Statement Johanny doing well today with only slight L knee discomfort.  Tolerated all ROM and strengthening activities well.  Some limited patellar mobility all directions with manual work today.  Pt. instructed on self-performance of patellar mobs and scar massage.  Ice/compression to L knee to end treatment to decrease post-exercise swelling and pain.   PT Treatment/Interventions Cryotherapy;ADLs/Self Care Home Management;Functional mobility training;Stair training;Gait training;Ultrasound;Moist Heat;Therapeutic activities;Therapeutic exercise;Balance training;Neuromuscular re-education;Manual techniques;Vasopneumatic Device;Taping   PT Next Visit Plan scar and patellar mobs, soft tissue  to distal hamstrings and quads, A/ROM especially flexion,quad motor control;   gait, game ready; assess walking endurance for STG  Patient will benefit from skilled therapeutic intervention in order to improve the following deficits and impairments:  Abnormal gait, Decreased range of motion, Pain, Increased edema, Decreased strength, Decreased scar mobility, Impaired flexibility, Decreased activity tolerance, Decreased endurance  Visit Diagnosis: Acute pain of left knee  Other abnormalities of gait and mobility  Localized edema  Stiffness of left knee, not elsewhere classified     Problem List Patient Active Problem List   Diagnosis Date Noted  . S/P total knee replacement using cement, left 11/20/2016  . Osteopenia 02/28/2015  . Left lumbar radiculitis 02/28/2015  . Acute blood loss anemia 09/20/2014  . Primary osteoarthritis of right knee 09/14/2014  . Osteoarthritis of left knee 09/14/2014  . Vitamin B 12 deficiency 07/29/2014  . Obesity (BMI 30-39.9) 12/08/2013  . Osteoarthritis, knee 05/30/2012  . Metabolic syndrome 26/33/3545  . DVT, HX OF 02/10/2010  . Vitamin D deficiency 08/25/2007  . Disorder of bone and cartilage 04/08/2007  . Hypothyroidism 11/18/2006  . ANEMIA-NOS 11/18/2006  . Essential hypertension 11/18/2006  . GERD 11/18/2006    Bess Harvest, PTA 01/03/17 5:53 PM  [facility] [address] [place], [place], [address] Phone: [phone]   Fax:  [phone]  Name: Breelyn Icard MRN: [id number] Date of Birth: 07-14-1950

## 2017-01-04 ENCOUNTER — Ambulatory Visit: Payer: Medicare Other | Admitting: Physical Therapy

## 2017-01-04 ENCOUNTER — Encounter: Payer: Self-pay | Admitting: Physical Therapy

## 2017-01-04 DIAGNOSIS — R2689 Other abnormalities of gait and mobility: Secondary | ICD-10-CM | POA: Diagnosis not present

## 2017-01-04 DIAGNOSIS — R600 Localized edema: Secondary | ICD-10-CM | POA: Diagnosis not present

## 2017-01-04 DIAGNOSIS — G8929 Other chronic pain: Secondary | ICD-10-CM

## 2017-01-04 DIAGNOSIS — M25562 Pain in left knee: Secondary | ICD-10-CM

## 2017-01-04 DIAGNOSIS — M25662 Stiffness of left knee, not elsewhere classified: Secondary | ICD-10-CM

## 2017-01-04 DIAGNOSIS — M6281 Muscle weakness (generalized): Secondary | ICD-10-CM | POA: Diagnosis not present

## 2017-01-04 NOTE — Therapy (Signed)
[facility] [address] [place], [place], [address] Phone: [phone]   Fax:  [phone]  Physical Therapy Treatment  Patient Details  Name: Kimberly [name] MRN: [id number] Date of Birth: [birth year] Referring Provider: [name], MD  Encounter Date: 01/04/2017      PT End of Session - 01/04/17 0855    Visit Number 9   Number of Visits 10   Date for PT Re-Evaluation 02/11/17   PT Start Time 0830   PT Stop Time 0945   PT Time Calculation (min) 75 min   Behavior During Therapy [facility] for tasks assessed/performed      Past Medical History:  Diagnosis Date  . Anemia 2005   hb 11.5  . Arthritis   . DVT (deep venous thrombosis) ([place])    2003, coumadin x 6 mon  . GERD (gastroesophageal reflux disease) 2001   no problem now  . Hypertension   . Hypothyroidism    off meds  . Neuromuscular disorder ([place])    painful in L foot, being started on Gabapentin- 11/08/2016  . Osteopenia   . Vegetarian diet    eats fish and eggs; no meat    Past Surgical History:  Procedure Laterality Date  . JOINT REPLACEMENT    . KNEE ARTHROSCOPY Right 03/16/2003  . OOPHORECTOMY     "think it was my left"  . TOTAL KNEE ARTHROPLASTY Right 09/14/2014   Procedure: RIGHT TOTAL KNEE ARTHROPLASTY;  Surgeon: Garald Balding, MD;  Location: [facility];  Service: Orthopedics;  Laterality: Right;  . TOTAL KNEE ARTHROPLASTY Left 11/20/2016  . TOTAL KNEE ARTHROPLASTY Left 11/20/2016   Procedure: LEFT TOTAL KNEE ARTHROPLASTY;  Surgeon: Garald Balding, MD;  Location: [facility];  Service: Orthopedics;  Laterality: Left;    There were no vitals filed for this visit.      Subjective Assessment - 01/04/17 0858    Subjective I am feeling improvement in my knee and the bending.   Currently in Pain? Yes   Pain Score 1    Pain Location Knee   Pain Orientation Left   Pain Descriptors / Indicators Dull   Multiple Pain Sites No             [facility] PT Assessment - 01/04/17 0001      AROM   Left Knee Flexion 97     PROM   Left Knee Flexion 100                     [facility] Adult PT Treatment/Exercise - 01/04/17 0001      Knee/Hip Exercises: Stretches   Knee: Self-Stretch to increase Flexion --  On second step: knee flexion 2x 20 with occasional end range     Knee/Hip Exercises: Aerobic   Nustep L1 15 min      Knee/Hip Exercises: Machines for Strengthening   Total Gym Leg Press Seat 6: Bil 50# 2x 10, LTLE 1 plate 2x 10     Knee/Hip Exercises: Standing   Forward Step Up Left;2 sets;10 reps;Hand Hold: 2;Step Height: 6"     Knee/Hip Exercises: Seated   Long Arc Quad Strengthening;Left;3 sets;10 reps;Weights   Long Arc Quad Weight 3 lbs.   Sit to Sand 2 sets;10 reps;without UE support     Knee/Hip Exercises: Prone   Hamstring Curl 20 reps     Vasopneumatic   Number Minutes Vasopneumatic  15 minutes   Vasopnuematic Location  Knee   Vasopneumatic Pressure Medium  Vasopneumatic Temperature  3 flakes     Manual Therapy   Joint Mobilization Patella mobs   Soft tissue mobilization scar mobilization with knee on stretch   Muscle Energy Technique Contract relax stretch for knee flexion and extension in prone to improve ROM                  PT Short Term Goals - 12/28/16 1050      PT SHORT TERM GOAL #1   Title be independent in initial HEP   Status Achieved     PT SHORT TERM GOAL #2   Title demonstrate Lt knee A/ROM flexion to > or = to 95 degrees to improve sitting and descending steps   Time 4   Period Weeks   Status Achieved     PT SHORT TERM GOAL #3   Title report < or = to 3/10 Lt knee pain with standing and walking   Status Achieved     PT SHORT TERM GOAL #4   Title improve strength and endurance to stand and walk > or = to 25 minutes to improve community ambulation   Time 4   Period Weeks   Status On-going           PT Long Term Goals - 12/28/16 1051      PT LONG TERM  GOAL #1   Title be independent in advanced HEP   Time 8   Period Weeks   Status On-going     PT LONG TERM GOAL #2   Title reduce FOTO to < or = to 42% limitation   Time 8   Period Weeks   Status On-going     PT LONG TERM GOAL #3   Title demonstrate Lt knee A/ROM flexion to > or = to 115 degrees to allow for squatting and descending steps without substitution   Time 8   Period Weeks   Status On-going     PT LONG TERM GOAL #4   Title improve Lt knee strength and flexibility to ascend and descend steps with step-over-step gait with use of 1 rail   Time 8   Period Weeks   Status On-going     PT LONG TERM GOAL #5   Title demonstrate 5/5 Lt knee strength with good quad set to improve safety and endurance in the community   Time 8   Period Weeks   Status On-going     PT LONG TERM GOAL #6   Title improve strength and endurance to ambulate for 30-45 minutes without limitation   Time 8   Period Weeks   Status On-going               Plan - 01/04/17 6440    Clinical Impression Statement Pt's ROM continues to slowly improve. Pt is motivated and doing her part at home. Knee flexion still limited with thick end feel. Edema persists but alos improving.  Pt ambulates with improved knee flexion but still limited.    Rehab Potential Good   PT Frequency 3x / week   PT Duration 8 weeks   PT Treatment/Interventions Cryotherapy;ADLs/Self Care Home Management;Functional mobility training;Stair training;Gait training;Ultrasound;Moist Heat;Therapeutic activities;Therapeutic exercise;Balance training;Neuromuscular re-education;Manual techniques;Vasopneumatic Device;Taping   PT Next Visit Plan  FOTO/10th visit next: scar and patellar mobs, soft tissue to distal hamstrings and quads, A/ROM especially flexion,quad motor control;   gait, game ready    Consulted and Agree with Plan of Care Patient      Patient will benefit  from skilled therapeutic intervention in order to improve the  following deficits and impairments:  Abnormal gait, Decreased range of motion, Pain, Increased edema, Decreased strength, Decreased scar mobility, Impaired flexibility, Decreased activity tolerance, Decreased endurance  Visit Diagnosis: Acute pain of left knee  Other abnormalities of gait and mobility  Localized edema  Stiffness of left knee, not elsewhere classified  Chronic pain of left knee     Problem List Patient Active Problem List   Diagnosis Date Noted  . S/P total knee replacement using cement, left 11/20/2016  . Osteopenia 02/28/2015  . Left lumbar radiculitis 02/28/2015  . Acute blood loss anemia 09/20/2014  . Primary osteoarthritis of right knee 09/14/2014  . Osteoarthritis of left knee 09/14/2014  . Vitamin B 12 deficiency 07/29/2014  . Obesity (BMI 30-39.9) 12/08/2013  . Osteoarthritis, knee 05/30/2012  . Metabolic syndrome 70/26/3785  . DVT, HX OF 02/10/2010  . Vitamin D deficiency 08/25/2007  . Disorder of bone and cartilage 04/08/2007  . Hypothyroidism 11/18/2006  . ANEMIA-NOS 11/18/2006  . Essential hypertension 11/18/2006  . GERD 11/18/2006    COCHRAN,JENNIFER, PTA 01/04/2017, 9:34 AM  [facility] [address] [place], [place], [address] Phone: [phone]   Fax:  [phone]  Name: Laelah Siravo MRN: [id number] Date of Birth: 1951-02-28

## 2017-01-07 ENCOUNTER — Ambulatory Visit: Payer: Medicare Other | Admitting: Physical Therapy

## 2017-01-07 ENCOUNTER — Encounter: Payer: Self-pay | Admitting: Physical Therapy

## 2017-01-07 DIAGNOSIS — R600 Localized edema: Secondary | ICD-10-CM

## 2017-01-07 DIAGNOSIS — M25562 Pain in left knee: Secondary | ICD-10-CM | POA: Diagnosis not present

## 2017-01-07 DIAGNOSIS — M6281 Muscle weakness (generalized): Secondary | ICD-10-CM | POA: Diagnosis not present

## 2017-01-07 DIAGNOSIS — R2689 Other abnormalities of gait and mobility: Secondary | ICD-10-CM | POA: Diagnosis not present

## 2017-01-07 DIAGNOSIS — G8929 Other chronic pain: Secondary | ICD-10-CM | POA: Diagnosis not present

## 2017-01-07 DIAGNOSIS — M25662 Stiffness of left knee, not elsewhere classified: Secondary | ICD-10-CM

## 2017-01-07 NOTE — Therapy (Addendum)
[facility] [address] [place], [place], [address] Phone: [phone]   Fax:  [phone]  Physical Therapy Treatment  Patient Details  Name: Kimberly [name] MRN: [id number] Date of Birth: January 24, 1951 Referring Provider: [name], MD  Encounter Date: 01/07/2017      PT End of Session - 01/07/17 0847    Visit Number 10   Number of Visits 11   Date for PT Re-Evaluation 02/11/17   PT Start Time 0835   PT Stop Time 0945   PT Time Calculation (min) 70 min   Activity Tolerance Patient tolerated treatment well   Behavior During Therapy [facility] for tasks assessed/performed      Past Medical History:  Diagnosis Date  . Anemia 2005   hb 11.5  . Arthritis   . DVT (deep venous thrombosis) ([place])    2003, coumadin x 6 mon  . GERD (gastroesophageal reflux disease) 2001   no problem now  . Hypertension   . Hypothyroidism    off meds  . Neuromuscular disorder ([place])    painful in L foot, being started on Gabapentin- 11/08/2016  . Osteopenia   . Vegetarian diet    eats fish and eggs; no meat    Past Surgical History:  Procedure Laterality Date  . JOINT REPLACEMENT    . KNEE ARTHROSCOPY Right 03/16/2003  . OOPHORECTOMY     "think it was my left"  . TOTAL KNEE ARTHROPLASTY Right 09/14/2014   Procedure: RIGHT TOTAL KNEE ARTHROPLASTY;  Surgeon: Garald Balding, MD;  Location: [facility];  Service: Orthopedics;  Laterality: Right;  . TOTAL KNEE ARTHROPLASTY Left 11/20/2016  . TOTAL KNEE ARTHROPLASTY Left 11/20/2016   Procedure: LEFT TOTAL KNEE ARTHROPLASTY;  Surgeon: Garald Balding, MD;  Location: [facility];  Service: Orthopedics;  Laterality: Left;    There were no vitals filed for this visit.      Subjective Assessment - 01/07/17 0907    Subjective Pt did a lot of walking without cane this weekend and has more swelling today. Pain did not increase very much but it is a little sore.    Pertinent History Rt TKA 2016   Currently in Pain? Yes   Pain Score 2    Pain Location Knee   Pain Orientation Left   Pain Descriptors / Indicators Sore   Aggravating Factors  Overdoing it   Pain Relieving Factors ice   Multiple Pain Sites No            [facility] PT Assessment - 01/07/17 0001      Observation/Other Assessments   Focus on Therapeutic Outcomes (FOTO)  28%   CJ Current & DC : GOAL CK                     [facility] Adult PT Treatment/Exercise - 01/07/17 0001      Knee/Hip Exercises: Stretches   Knee: Self-Stretch to increase Flexion --  On second step: knee flexion 2x 20 with occasional end range     Knee/Hip Exercises: Machines for Strengthening   Total Gym Leg Press Seat #6 Bil 55# 2x10 , LTLE 30# 2x10     Knee/Hip Exercises: Standing   Forward Step Up Left;2 sets;10 reps;Hand Hold: 2;Step Height: 6"     Knee/Hip Exercises: Seated   Long Arc Quad Strengthening;Left;3 sets;10 reps;Weights   Long Arc Quad Weight 3 lbs.     Knee/Hip Exercises: Prone   Hamstring Curl --  20x 2#  Vasopneumatic   Number Minutes Vasopneumatic  15 minutes   Vasopnuematic Location  Knee   Vasopneumatic Pressure Medium   Vasopneumatic Temperature  3 flakes     Manual Therapy   Manual therapy comments PROM work into flexion and extensio to tolerance   Soft tissue mobilization scar mobilization with knee on stretch                  PT Short Term Goals - 12/28/16 1050      PT SHORT TERM GOAL #1   Title be independent in initial HEP   Status Achieved     PT SHORT TERM GOAL #2   Title demonstrate Lt knee A/ROM flexion to > or = to 95 degrees to improve sitting and descending steps   Time 4   Period Weeks   Status Achieved     PT SHORT TERM GOAL #3   Title report < or = to 3/10 Lt knee pain with standing and walking   Status Achieved     PT SHORT TERM GOAL #4   Title improve strength and endurance to stand and walk > or = to 25 minutes to improve community ambulation   Time 4    Period Weeks   Status On-going           PT Long Term Goals - 12/28/16 1051      PT LONG TERM GOAL #1   Title be independent in advanced HEP   Time 8   Period Weeks   Status On-going     PT LONG TERM GOAL #2   Title reduce FOTO to < or = to 42% limitation   Time 8   Period Weeks   Status On-going     PT LONG TERM GOAL #3   Title demonstrate Lt knee A/ROM flexion to > or = to 115 degrees to allow for squatting and descending steps without substitution   Time 8   Period Weeks   Status On-going     PT LONG TERM GOAL #4   Title improve Lt knee strength and flexibility to ascend and descend steps with step-over-step gait with use of 1 rail   Time 8   Period Weeks   Status On-going     PT LONG TERM GOAL #5   Title demonstrate 5/5 Lt knee strength with good quad set to improve safety and endurance in the community   Time 8   Period Weeks   Status On-going     PT LONG TERM GOAL #6   Title improve strength and endurance to ambulate for 30-45 minutes without limitation   Time 8   Period Weeks   Status On-going               Plan - 01/07/17 0847    Clinical Impression Statement This past weekend pt had family in town. She walked alot and chose to not use her cane for most of it. This increased her swelling and made the knee slightly more sore. This did not limit her session today.    Rehab Potential Good   PT Frequency 3x / week   PT Duration 8 weeks   PT Treatment/Interventions Cryotherapy;ADLs/Self Care Home Management;Functional mobility training;Stair training;Gait training;Ultrasound;Moist Heat;Therapeutic activities;Therapeutic exercise;Balance training;Neuromuscular re-education;Manual techniques;Vasopneumatic Device;Taping   PT Next Visit Plan To MD this Friday: measure ROM next & send note   Consulted and Agree with Plan of Care Patient      Patient will benefit from skilled therapeutic  intervention in order to improve the following deficits and  impairments:  Abnormal gait, Decreased range of motion, Pain, Increased edema, Decreased strength, Decreased scar mobility, Impaired flexibility, Decreased activity tolerance, Decreased endurance  Visit Diagnosis: Acute pain of left knee  Other abnormalities of gait and mobility  Localized edema  Stiffness of left knee, not elsewhere classified  Chronic pain of left knee       G-Codes - Jan 24, 2017 0944    Functional Assessment Tool Used (Outpatient Only) FOTO: 28% limitation   Functional Limitation Mobility: Walking and moving around   Mobility: Walking and Moving Around Current Status [phone]) At least 20 percent but less than 40 percent impaired, limited or restricted   Mobility: Walking and Moving Around Goal Status [phone]) At least 20 percent but less than 40 percent impaired, limited or restricted      Problem List Patient Active Problem List   Diagnosis Date Noted  . S/P total knee replacement using cement, left 11/20/2016  . Osteopenia 02/28/2015  . Left lumbar radiculitis 02/28/2015  . Acute blood loss anemia 09/20/2014  . Primary osteoarthritis of right knee 09/14/2014  . Osteoarthritis of left knee 09/14/2014  . Vitamin B 12 deficiency 07/29/2014  . Obesity (BMI 30-39.9) 12/08/2013  . Osteoarthritis, knee 05/30/2012  . Metabolic syndrome 29/24/4628  . DVT, HX OF 02/10/2010  . Vitamin D deficiency 08/25/2007  . Disorder of bone and cartilage 04/08/2007  . Hypothyroidism 11/18/2006  . ANEMIA-NOS 11/18/2006  . Essential hypertension 11/18/2006  . GERD 11/18/2006    Sigurd Sos, PT 24-Jan-2017 9:49 AM  [facility] [address], [place], [place], [address] Phone: [phone]   Fax:  [phone]  Name: Kimberly [name] MRN: [id number] Date of Birth: [birth year]

## 2017-01-09 ENCOUNTER — Ambulatory Visit: Payer: Medicare Other

## 2017-01-09 DIAGNOSIS — R2689 Other abnormalities of gait and mobility: Secondary | ICD-10-CM | POA: Diagnosis not present

## 2017-01-09 DIAGNOSIS — G8929 Other chronic pain: Secondary | ICD-10-CM | POA: Diagnosis not present

## 2017-01-09 DIAGNOSIS — M25662 Stiffness of left knee, not elsewhere classified: Secondary | ICD-10-CM | POA: Diagnosis not present

## 2017-01-09 DIAGNOSIS — M6281 Muscle weakness (generalized): Secondary | ICD-10-CM | POA: Diagnosis not present

## 2017-01-09 DIAGNOSIS — M25562 Pain in left knee: Secondary | ICD-10-CM | POA: Diagnosis not present

## 2017-01-09 DIAGNOSIS — R600 Localized edema: Secondary | ICD-10-CM | POA: Diagnosis not present

## 2017-01-09 NOTE — Therapy (Signed)
Dictation #1 [id number]  [id number] [facility] [address] [place], [place], [address] Phone: [phone]   Fax:  [phone]  Physical Therapy Treatment  Patient Details  Name: Kimberly [name] MRN: [id number] Date of Birth: 31-Jan-1951 Referring Provider: [name], MD  Encounter Date: 01/09/2017      PT End of Session - 01/09/17 0838    Visit Number 11   Number of Visits 11   Date for PT Re-Evaluation 02/11/17   PT Start Time 0832   PT Stop Time 0935   PT Time Calculation (min) 63 min   Activity Tolerance Patient tolerated treatment well   Behavior During Therapy [facility] for tasks assessed/performed      Past Medical History:  Diagnosis Date  . Anemia 2005   hb 11.5  . Arthritis   . DVT (deep venous thrombosis) ([place])    2003, coumadin x 6 mon  . GERD (gastroesophageal reflux disease) 2001   no problem now  . Hypertension   . Hypothyroidism    off meds  . Neuromuscular disorder ([place])    painful in L foot, being started on Gabapentin- 11/08/2016  . Osteopenia   . Vegetarian diet    eats fish and eggs; no meat    Past Surgical History:  Procedure Laterality Date  . JOINT REPLACEMENT    . KNEE ARTHROSCOPY Right 03/16/2003  . OOPHORECTOMY     "think it was my left"  . TOTAL KNEE ARTHROPLASTY Right 09/14/2014   Procedure: RIGHT TOTAL KNEE ARTHROPLASTY;  Surgeon: Garald Balding, MD;  Location: [facility];  Service: Orthopedics;  Laterality: Right;  . TOTAL KNEE ARTHROPLASTY Left 11/20/2016  . TOTAL KNEE ARTHROPLASTY Left 11/20/2016   Procedure: LEFT TOTAL KNEE ARTHROPLASTY;  Surgeon: Garald Balding, MD;  Location: [facility];  Service: Orthopedics;  Laterality: Left;    There were no vitals filed for this visit.      Subjective Assessment - 01/09/17 0836    Subjective Kimberly [name] doing well today noting less soreness than last visit.     Patient Stated Goals improve Lt knee A/ROM, improve endurance,  imrpove gait   Currently in Pain? Yes   Pain Score 2    Pain Location Knee   Pain Orientation Left   Pain Descriptors / Indicators Aching   Pain Type Surgical pain   Pain Onset 1 to 4 weeks ago   Pain Frequency Intermittent   Aggravating Factors  Overdoing it   Pain Relieving Factors ice    Multiple Pain Sites No                         [facility] Adult PT Treatment/Exercise - 01/09/17 3818      Neuro Re-ed    Neuro Re-ed Details  Alternating side step up-and-over on airex x 5 reps each way; no UE support; L SLS stance 2 x 20 sec on airex pad 2 ski poles      Knee/Hip Exercises: Stretches   Ambulance person reps;30 seconds   Quad Stretch Limitations prone with strap     Knee/Hip Exercises: Aerobic   Stationary Bike Level 0 x 6 minutes-full revolutions    Nustep L1, 4 min      Knee/Hip Exercises: Standing   Heel Raises 15 reps;3 seconds   Heel Raises Limitations B con/wt. shift over L ecc   Terminal Knee Extension Limitations TKE L ball squeeze on wall 5" x 15 reps  Step Down 10 reps;Hand Hold: 2;Step Height: 4"   Step Down Limitations focus on slow eccentric quad control    Functional Squat 15 reps;3 seconds   Functional Squat Limitations counter    Other Standing Knee Exercises Alternating lunge onto BOSU ball (up) x 10 reps each leg;    Other Standing Knee Exercises Standing L knee flexion stretch into step 10" x 5 reps      Vasopneumatic   Number Minutes Vasopneumatic  15 minutes   Vasopnuematic Location  Knee   Vasopneumatic Pressure Medium   Vasopneumatic Temperature  3 flakes     Manual Therapy   Manual Therapy Muscle Energy Technique;Joint mobilization   Manual therapy comments PROM work into flexion and extensio to tolerance   Joint Mobilization Patella mobs   Soft tissue mobilization scar mobilization with knee on stretch   Muscle Energy Technique Contract-relax stretch for quad in prone knee flexion                   PT Short  Term Goals - 12/28/16 1050      PT SHORT TERM GOAL #1   Title be independent in initial HEP   Status Achieved     PT SHORT TERM GOAL #2   Title demonstrate Lt knee A/ROM flexion to > or = to 95 degrees to improve sitting and descending steps   Time 4   Period Weeks   Status Achieved     PT SHORT TERM GOAL #3   Title report < or = to 3/10 Lt knee pain with standing and walking   Status Achieved     PT SHORT TERM GOAL #4   Title improve strength and endurance to stand and walk > or = to 25 minutes to improve community ambulation   Time 4   Period Weeks   Status On-going           PT Long Term Goals - 12/28/16 1051      PT LONG TERM GOAL #1   Title be independent in advanced HEP   Time 8   Period Weeks   Status On-going     PT LONG TERM GOAL #2   Title reduce FOTO to < or = to 42% limitation   Time 8   Period Weeks   Status On-going     PT LONG TERM GOAL #3   Title demonstrate Lt knee A/ROM flexion to > or = to 115 degrees to allow for squatting and descending steps without substitution   Time 8   Period Weeks   Status On-going     PT LONG TERM GOAL #4   Title improve Lt knee strength and flexibility to ascend and descend steps with step-over-step gait with use of 1 rail   Time 8   Period Weeks   Status On-going     PT LONG TERM GOAL #5   Title demonstrate 5/5 Lt knee strength with good quad set to improve safety and endurance in the community   Time 8   Period Weeks   Status On-going     PT LONG TERM GOAL #6   Title improve strength and endurance to ambulate for 30-45 minutes without limitation   Time 8   Period Weeks   Status On-going               Plan - 01/09/17 1610    Clinical Impression Statement Pt. doing well today noting less soreness than last visit.  Feels like  she has recovered from "overdoing it" a few days ago.  Tolerated all ROM and strengthening activities well today.  4" eccentric step down added today with focus on quad/TKE  control.  Some difficulty with balance work on compliant surfaces.  Reported a decrease in knee soreness following therex.  Some visible swelling following treatment thus Ice/comrpession applied to L knee to end treatment.  Pt. has therapy in early AM and sees MD later in AM on 8.31.     PT Treatment/Interventions Cryotherapy;ADLs/Self Care Home Management;Functional mobility training;Stair training;Gait training;Ultrasound;Moist Heat;Therapeutic activities;Therapeutic exercise;Balance training;Neuromuscular re-education;Manual techniques;Vasopneumatic Device;Taping   PT Next Visit Plan To MD this Friday: measure ROM next & send note      Patient will benefit from skilled therapeutic intervention in order to improve the following deficits and impairments:  Abnormal gait, Decreased range of motion, Pain, Increased edema, Decreased strength, Decreased scar mobility, Impaired flexibility, Decreased activity tolerance, Decreased endurance  Visit Diagnosis: Acute pain of left knee  Other abnormalities of gait and mobility  Localized edema  Stiffness of left knee, not elsewhere classified  Chronic pain of left knee     Problem List Patient Active Problem List   Diagnosis Date Noted  . S/P total knee replacement using cement, left 11/20/2016  . Osteopenia 02/28/2015  . Left lumbar radiculitis 02/28/2015  . Acute blood loss anemia 09/20/2014  . Primary osteoarthritis of right knee 09/14/2014  . Osteoarthritis of left knee 09/14/2014  . Vitamin B 12 deficiency 07/29/2014  . Obesity (BMI 30-39.9) 12/08/2013  . Osteoarthritis, knee 05/30/2012  . Metabolic syndrome 02/27/5101  . DVT, HX OF 02/10/2010  . Vitamin D deficiency 08/25/2007  . Disorder of bone and cartilage 04/08/2007  . Hypothyroidism 11/18/2006  . ANEMIA-NOS 11/18/2006  . Essential hypertension 11/18/2006  . GERD 11/18/2006    Bess Harvest, PTA 01/09/17 9:30 AM  [facility] [address] [place], [place], [address] Phone: [phone]   Fax:  [phone]  Name: Kimberly [name] MRN: [id number] Date of Birth: 1951-02-25

## 2017-01-11 ENCOUNTER — Encounter (INDEPENDENT_AMBULATORY_CARE_PROVIDER_SITE_OTHER): Payer: Self-pay | Admitting: Orthopaedic Surgery

## 2017-01-11 ENCOUNTER — Ambulatory Visit: Payer: Medicare Other | Admitting: Physical Therapy

## 2017-01-11 ENCOUNTER — Encounter: Payer: Self-pay | Admitting: Physical Therapy

## 2017-01-11 ENCOUNTER — Ambulatory Visit (INDEPENDENT_AMBULATORY_CARE_PROVIDER_SITE_OTHER): Payer: Medicare Other | Admitting: Orthopaedic Surgery

## 2017-01-11 VITALS — BP 158/85 | HR 78 | Resp 14 | Ht 64.0 in | Wt 165.0 lb

## 2017-01-11 DIAGNOSIS — R600 Localized edema: Secondary | ICD-10-CM | POA: Diagnosis not present

## 2017-01-11 DIAGNOSIS — M25562 Pain in left knee: Secondary | ICD-10-CM

## 2017-01-11 DIAGNOSIS — R2689 Other abnormalities of gait and mobility: Secondary | ICD-10-CM

## 2017-01-11 DIAGNOSIS — M6281 Muscle weakness (generalized): Secondary | ICD-10-CM | POA: Diagnosis not present

## 2017-01-11 DIAGNOSIS — M25662 Stiffness of left knee, not elsewhere classified: Secondary | ICD-10-CM

## 2017-01-11 DIAGNOSIS — G8929 Other chronic pain: Secondary | ICD-10-CM | POA: Diagnosis not present

## 2017-01-11 DIAGNOSIS — Z96652 Presence of left artificial knee joint: Secondary | ICD-10-CM

## 2017-01-11 NOTE — Progress Notes (Signed)
Office Visit Note   Patient: Kimberly [name]           Date of Birth: 06-02-1950           MRN: [id number] Visit Date: 01/11/2017              Requested by: Eulas Post, MD [place], [place] [address] PCP: Eulas Post, MD   Assessment & Plan: Visit Diagnoses:  1. History of left knee replacement     Plan: 7 weeks status post primary left total knee replacement and doing well. Not using any ambulatory aid. Working with exercises. Not taking any pain medicine. Continue with exercise program and increasing flexion office 2 months  Follow-Up Instructions: Return in about 2 months (around 03/13/2017).   Orders:  No orders of the defined types were placed in this encounter.  No orders of the defined types were placed in this encounter.     Procedures: No procedures performed   Clinical Data: No additional findings.   Subjective: Chief Complaint  Patient presents with  . Left Knee - Routine Post Op    Kimberly [name] is a 66 y o 7 weeks status post Left TKA. She relates she is still doing PT, ambulates unassisted and is driving now. She does have some night time pain  Doing very well without a limp. Driving and happy with her prior progress. Still having some decreased flexion compared to her right total knee replacement but "working out". Shortness of breath or chest pain. No numbness or tingling. Denies fever or chills  HPI  Review of Systems  Constitutional: Negative for chills, fatigue and fever.  Eyes: Negative for itching.  Respiratory: Negative for chest tightness and shortness of breath.   Cardiovascular: Negative for chest pain, palpitations and leg swelling.  Gastrointestinal: Negative for blood in stool, constipation and diarrhea.  Musculoskeletal: Negative for back pain, joint swelling, neck pain and neck stiffness.  Neurological: Negative for dizziness, weakness, numbness and headaches.  Hematological: Does not bruise/bleed easily.    Psychiatric/Behavioral: Negative for sleep disturbance. The patient is not nervous/anxious.      Objective: Vital Signs: BP (!) 158/85   Pulse 78   Resp 14   Ht 5\' 4"  (1.626 m)   Wt 165 lb (74.8 kg)   LMP 05/14/1998   BMI 28.32 kg/m   Physical Exam  Ortho Exam left total knee replacement with well-healed incision. No localized tenderness. No instability. No effusion. Knee wasn't particularly hot or warm nor red. No calf pain. No swelling distally. Neurovascular exam intact. Straight leg raise negative. Full extension and about 97 of flexion. Popping or clicking or localized areas of tenderness  Specialty Comments:  No specialty comments available.  Imaging: No results found.   PMFS History: Patient Active Problem List   Diagnosis Date Noted  . S/P total knee replacement using cement, left 11/20/2016  . Osteopenia 02/28/2015  . Left lumbar radiculitis 02/28/2015  . Acute blood loss anemia 09/20/2014  . Primary osteoarthritis of right knee 09/14/2014  . Osteoarthritis of left knee 09/14/2014  . Vitamin B 12 deficiency 07/29/2014  . Obesity (BMI 30-39.9) 12/08/2013  . Osteoarthritis, knee 05/30/2012  . Metabolic syndrome 73/41/9379  . DVT, HX OF 02/10/2010  . Vitamin D deficiency 08/25/2007  . Disorder of bone and cartilage 04/08/2007  . Hypothyroidism 11/18/2006  . ANEMIA-NOS 11/18/2006  . Essential hypertension 11/18/2006  . GERD 11/18/2006   Past Medical History:  Diagnosis Date  . Anemia  2005   hb 11.5  . Arthritis   . DVT (deep venous thrombosis) ([place])    2003, coumadin x 6 mon  . GERD (gastroesophageal reflux disease) 2001   no problem now  . Hypertension   . Hypothyroidism    off meds  . Neuromuscular disorder ([place])    painful in L foot, being started on Gabapentin- 11/08/2016  . Osteopenia   . Vegetarian diet    eats fish and eggs; no meat    Family History  Problem Relation Age of Onset  . Diabetes Mother   . Heart failure Father   .  Diabetes Other   . Alcohol abuse Other     Past Surgical History:  Procedure Laterality Date  . JOINT REPLACEMENT    . KNEE ARTHROSCOPY Right 03/16/2003  . OOPHORECTOMY     "think it was my left"  . TOTAL KNEE ARTHROPLASTY Right 09/14/2014   Procedure: RIGHT TOTAL KNEE ARTHROPLASTY;  Surgeon: Garald Balding, MD;  Location: [place];  Service: Orthopedics;  Laterality: Right;  . TOTAL KNEE ARTHROPLASTY Left 11/20/2016  . TOTAL KNEE ARTHROPLASTY Left 11/20/2016   Procedure: LEFT TOTAL KNEE ARTHROPLASTY;  Surgeon: Garald Balding, MD;  Location: [facility];  Service: Orthopedics;  Laterality: Left;   Social History   Occupational History  . Not on file.   Social History Main Topics  . Smoking status: Never Smoker  . Smokeless tobacco: Never Used  . Alcohol use No  . Drug use: No  . Sexual activity: Not on file

## 2017-01-11 NOTE — Therapy (Signed)
[facility] [address], [place], [place], [address] Phone: [phone]   Fax:  [phone]  Physical Therapy Treatment  Patient Details  Name: Kimberly [name] MRN: [id number] Date of Birth: Sep 20, 1950 Referring Provider: [name], MD  Encounter Date: 01/11/2017      PT End of Session - 01/11/17 0851    Visit Number 12   Date for PT Re-Evaluation 02/11/17   PT Start Time 0831   PT Stop Time 0945   PT Time Calculation (min) 74 min   Activity Tolerance Patient tolerated treatment well   Behavior During Therapy [facility] for tasks assessed/performed      Past Medical History:  Diagnosis Date  . Anemia 2005   hb 11.5  . Arthritis   . DVT (deep venous thrombosis) ([place])    2003, coumadin x 6 mon  . GERD (gastroesophageal reflux disease) 2001   no problem now  . Hypertension   . Hypothyroidism    off meds  . Neuromuscular disorder ([place])    painful in L foot, being started on Gabapentin- 11/08/2016  . Osteopenia   . Vegetarian diet    eats fish and eggs; no meat    Past Surgical History:  Procedure Laterality Date  . JOINT REPLACEMENT    . KNEE ARTHROSCOPY Right 03/16/2003  . OOPHORECTOMY     "think it was my left"  . TOTAL KNEE ARTHROPLASTY Right 09/14/2014   Procedure: RIGHT TOTAL KNEE ARTHROPLASTY;  Surgeon: Garald Balding, MD;  Location: [place];  Service: Orthopedics;  Laterality: Right;  . TOTAL KNEE ARTHROPLASTY Left 11/20/2016  . TOTAL KNEE ARTHROPLASTY Left 11/20/2016   Procedure: LEFT TOTAL KNEE ARTHROPLASTY;  Surgeon: Garald Balding, MD;  Location: [place];  Service: Orthopedics;  Laterality: Left;    There were no vitals filed for this visit.      Subjective Assessment - 01/11/17 0853    Subjective No pain until I hit the bike. Stretching pain mainly.    Currently in Pain? No/denies  Not coming into sesison today.   Multiple Pain Sites No            [facility] PT Assessment - 01/11/17  0001      AROM   Left Knee Extension 0   Left Knee Flexion 100     PROM   Left Knee Flexion 101                     [facility] Adult PT Treatment/Exercise - 01/11/17 0001      Knee/Hip Exercises: Stretches   Sports administrator Left;3 reps;20 seconds   Quad Stretch Limitations prone with strap   Knee: Self-Stretch to increase Flexion --  20x in supine with ball   Gastroc Stretch Both;2 reps;30 seconds   Other Knee/Hip Stretches Hip stretching as it was tight from comps     Knee/Hip Exercises: Aerobic   Stationary Bike L1 x 10 min   Nustep L1 x 10 min     Knee/Hip Exercises: Machines for Strengthening   Total Gym Leg Press Seat 6 Bil 60# 20x,LTLE 30# 20x     Knee/Hip Exercises: Prone   Hamstring Curl 1 set;20 reps     Vasopneumatic   Number Minutes Vasopneumatic  15 minutes   Vasopnuematic Location  Knee   Vasopneumatic Pressure Medium   Vasopneumatic Temperature  3 flakes     Manual Therapy   Soft tissue mobilization scar mobilization with knee on stretch  PT Short Term Goals - 12/28/16 1050      PT SHORT TERM GOAL #1   Title be independent in initial HEP   Status Achieved     PT SHORT TERM GOAL #2   Title demonstrate Lt knee A/ROM flexion to > or = to 95 degrees to improve sitting and descending steps   Time 4   Period Weeks   Status Achieved     PT SHORT TERM GOAL #3   Title report < or = to 3/10 Lt knee pain with standing and walking   Status Achieved     PT SHORT TERM GOAL #4   Title improve strength and endurance to stand and walk > or = to 25 minutes to improve community ambulation   Time 4   Period Weeks   Status On-going           PT Long Term Goals - 12/28/16 1051      PT LONG TERM GOAL #1   Title be independent in advanced HEP   Time 8   Period Weeks   Status On-going     PT LONG TERM GOAL #2   Title reduce FOTO to < or = to 42% limitation   Time 8   Period Weeks   Status On-going     PT LONG TERM  GOAL #3   Title demonstrate Lt knee A/ROM flexion to > or = to 115 degrees to allow for squatting and descending steps without substitution   Time 8   Period Weeks   Status On-going     PT LONG TERM GOAL #4   Title improve Lt knee strength and flexibility to ascend and descend steps with step-over-step gait with use of 1 rail   Time 8   Period Weeks   Status On-going     PT LONG TERM GOAL #5   Title demonstrate 5/5 Lt knee strength with good quad set to improve safety and endurance in the community   Time 8   Period Weeks   Status On-going     PT LONG TERM GOAL #6   Title improve strength and endurance to ambulate for 30-45 minutes without limitation   Time 8   Period Weeks   Status On-going               Plan - 01/11/17 3428    Clinical Impression Statement Pt's knee AROM continues to slowly improve. Her Rt knee meaures 105 degrees of flexion so she is almost equal bilaterally. Pt does have increased knee pain with a lot of single leg stance activities/exercises.    Rehab Potential Good   PT Frequency 3x / week   PT Duration 8 weeks   PT Treatment/Interventions Cryotherapy;ADLs/Self Care Home Management;Functional mobility training;Stair training;Gait training;Ultrasound;Moist Heat;Therapeutic activities;Therapeutic exercise;Balance training;Neuromuscular re-education;Manual techniques;Vasopneumatic Device;Taping   Consulted and Agree with Plan of Care Patient      Patient will benefit from skilled therapeutic intervention in order to improve the following deficits and impairments:  Abnormal gait, Decreased range of motion, Pain, Increased edema, Decreased strength, Decreased scar mobility, Impaired flexibility, Decreased activity tolerance, Decreased endurance  Visit Diagnosis: Acute pain of left knee  Other abnormalities of gait and mobility  Localized edema  Stiffness of left knee, not elsewhere classified     Problem List Patient Active Problem List    Diagnosis Date Noted  . S/P total knee replacement using cement, left 11/20/2016  . Osteopenia 02/28/2015  . Left lumbar radiculitis 02/28/2015  .  Acute blood loss anemia 09/20/2014  . Primary osteoarthritis of right knee 09/14/2014  . Osteoarthritis of left knee 09/14/2014  . Vitamin B 12 deficiency 07/29/2014  . Obesity (BMI 30-39.9) 12/08/2013  . Osteoarthritis, knee 05/30/2012  . Metabolic syndrome 02/33/4356  . DVT, HX OF 02/10/2010  . Vitamin D deficiency 08/25/2007  . Disorder of bone and cartilage 04/08/2007  . Hypothyroidism 11/18/2006  . ANEMIA-NOS 11/18/2006  . Essential hypertension 11/18/2006  . GERD 11/18/2006    Teyanna Thielman, PTA 01/11/2017, 9:30 AM  [facility] [address] [place], [place], [address] Phone: [phone]   Fax:  [phone]  Name: Kimberly [name] MRN: [id number] Date of Birth: 07/03/1950

## 2017-01-15 ENCOUNTER — Other Ambulatory Visit (INDEPENDENT_AMBULATORY_CARE_PROVIDER_SITE_OTHER): Payer: Self-pay

## 2017-01-15 ENCOUNTER — Ambulatory Visit: Payer: Medicare Other | Attending: Orthopaedic Surgery | Admitting: Physical Therapy

## 2017-01-15 ENCOUNTER — Encounter: Payer: Self-pay | Admitting: Physical Therapy

## 2017-01-15 DIAGNOSIS — M6281 Muscle weakness (generalized): Secondary | ICD-10-CM | POA: Insufficient documentation

## 2017-01-15 DIAGNOSIS — M25662 Stiffness of left knee, not elsewhere classified: Secondary | ICD-10-CM | POA: Diagnosis not present

## 2017-01-15 DIAGNOSIS — R2689 Other abnormalities of gait and mobility: Secondary | ICD-10-CM

## 2017-01-15 DIAGNOSIS — M25562 Pain in left knee: Secondary | ICD-10-CM | POA: Diagnosis not present

## 2017-01-15 DIAGNOSIS — R600 Localized edema: Secondary | ICD-10-CM | POA: Diagnosis not present

## 2017-01-15 DIAGNOSIS — G8929 Other chronic pain: Secondary | ICD-10-CM | POA: Diagnosis not present

## 2017-01-15 NOTE — Telephone Encounter (Signed)
yes

## 2017-01-15 NOTE — Therapy (Signed)
[facility] [address] [place], [place], [address] Phone: [phone]   Fax:  [phone]  Physical Therapy Treatment  Patient Details  Name: Kimberly [name] MRN: [id number] Date of Birth: 08/01/1950 Referring Provider: [name], MD  Encounter Date: 01/15/2017      PT End of Session - 01/15/17 0846    Visit Number 13   Date for PT Re-Evaluation 02/11/17   PT Start Time 0836   PT Stop Time 0935   PT Time Calculation (min) 59 min   Activity Tolerance Patient tolerated treatment well   Behavior During Therapy [facility] for tasks assessed/performed      Past Medical History:  Diagnosis Date  . Anemia 2005   hb 11.5  . Arthritis   . DVT (deep venous thrombosis) ([place])    2003, coumadin x 6 mon  . GERD (gastroesophageal reflux disease) 2001   no problem now  . Hypertension   . Hypothyroidism    off meds  . Neuromuscular disorder ([place])    painful in L foot, being started on Gabapentin- 11/08/2016  . Osteopenia   . Vegetarian diet    eats fish and eggs; no meat    Past Surgical History:  Procedure Laterality Date  . JOINT REPLACEMENT    . KNEE ARTHROSCOPY Right 03/16/2003  . OOPHORECTOMY     "think it was my left"  . TOTAL KNEE ARTHROPLASTY Right 09/14/2014   Procedure: RIGHT TOTAL KNEE ARTHROPLASTY;  Surgeon: Garald Balding, MD;  Location: [place];  Service: Orthopedics;  Laterality: Right;  . TOTAL KNEE ARTHROPLASTY Left 11/20/2016  . TOTAL KNEE ARTHROPLASTY Left 11/20/2016   Procedure: LEFT TOTAL KNEE ARTHROPLASTY;  Surgeon: Garald Balding, MD;  Location: [place];  Service: Orthopedics;  Laterality: Left;    There were no vitals filed for this visit.      Subjective Assessment - 01/15/17 0857    Subjective Standing in one place is hard . I can stand 10-15 minutes and then my knee is so stiff.  The doctor was happy but said to keep bending it all the time.  He measure my motion 96-97                          [facility] Adult PT Treatment/Exercise - 01/15/17 0001      Knee/Hip Exercises: Stretches   Sports administrator Left;3 reps;20 seconds   Quad Stretch Limitations prone with strap   Gastroc Stretch Both;2 reps;30 seconds     Knee/Hip Exercises: Aerobic   Stationary Bike L1 x 10 min   Nustep L1 x 10 min     Knee/Hip Exercises: Machines for Strengthening   Total Gym Leg Press Seat 6 Bil 60# 20x,LTLE 35# 20x     Knee/Hip Exercises: Standing   Forward Step Up Left;2 sets;10 reps;Hand Hold: 2;Step Height: 6"   Other Standing Knee Exercises Alternating lunge onto BOSU ball (up) x 10 reps each leg;    Other Standing Knee Exercises Standing L knee flexion stretch into step 10" x 5 reps      Knee/Hip Exercises: Prone   Hamstring Curl 1 set;20 reps     Vasopneumatic   Number Minutes Vasopneumatic  15 minutes   Vasopnuematic Location  Knee   Vasopneumatic Pressure Medium   Vasopneumatic Temperature  3 flakes                  PT Short Term Goals - 01/15/17  0856      PT SHORT TERM GOAL #1   Title be independent in initial HEP   Time 4   Period Weeks   Status Achieved     PT SHORT TERM GOAL #2   Title demonstrate Lt knee A/ROM flexion to > or = to 95 degrees to improve sitting and descending steps   Time 4   Period Weeks   Status Achieved     PT SHORT TERM GOAL #3   Title report < or = to 3/10 Lt knee pain with standing and walking   Time 4   Period Weeks   Status Achieved     PT SHORT TERM GOAL #4   Title improve strength and endurance to stand and walk > or = to 25 minutes to improve community ambulation   Time 4   Period Weeks   Status On-going           PT Long Term Goals - 12/28/16 1051      PT LONG TERM GOAL #1   Title be independent in advanced HEP   Time 8   Period Weeks   Status On-going     PT LONG TERM GOAL #2   Title reduce FOTO to < or = to 42% limitation   Time 8   Period Weeks   Status On-going     PT  LONG TERM GOAL #3   Title demonstrate Lt knee A/ROM flexion to > or = to 115 degrees to allow for squatting and descending steps without substitution   Time 8   Period Weeks   Status On-going     PT LONG TERM GOAL #4   Title improve Lt knee strength and flexibility to ascend and descend steps with step-over-step gait with use of 1 rail   Time 8   Period Weeks   Status On-going     PT LONG TERM GOAL #5   Title demonstrate 5/5 Lt knee strength with good quad set to improve safety and endurance in the community   Time 8   Period Weeks   Status On-going     PT LONG TERM GOAL #6   Title improve strength and endurance to ambulate for 30-45 minutes without limitation   Time 8   Period Weeks   Status On-going               Plan - 01/15/17 0844    Clinical Impression Statement Patient continues to progress and able to add some resistance to exercises today.  She continues to work on knee flexion which is difficult and has some soreness with hamstring exercises.  Pt coninues to benefit from skilled therapy for improving ROM and strength to return to functional activities.   Rehab Potential Good   PT Treatment/Interventions Cryotherapy;ADLs/Self Care Home Management;Functional mobility training;Stair training;Gait training;Ultrasound;Moist Heat;Therapeutic activities;Therapeutic exercise;Balance training;Neuromuscular re-education;Manual techniques;Vasopneumatic Device;Taping   PT Next Visit Plan flexion ROM, measure ROM, LE strength   Consulted and Agree with Plan of Care Patient      Patient will benefit from skilled therapeutic intervention in order to improve the following deficits and impairments:  Abnormal gait, Decreased range of motion, Pain, Increased edema, Decreased strength, Decreased scar mobility, Impaired flexibility, Decreased activity tolerance, Decreased endurance  Visit Diagnosis: Acute pain of left knee  Other abnormalities of gait and mobility  Localized  edema  Stiffness of left knee, not elsewhere classified     Problem List Patient Active Problem List   Diagnosis Date  Noted  . S/P total knee replacement using cement, left 11/20/2016  . Osteopenia 02/28/2015  . Left lumbar radiculitis 02/28/2015  . Acute blood loss anemia 09/20/2014  . Primary osteoarthritis of right knee 09/14/2014  . Osteoarthritis of left knee 09/14/2014  . Vitamin B 12 deficiency 07/29/2014  . Obesity (BMI 30-39.9) 12/08/2013  . Osteoarthritis, knee 05/30/2012  . Metabolic syndrome 23/30/0762  . DVT, HX OF 02/10/2010  . Vitamin D deficiency 08/25/2007  . Disorder of bone and cartilage 04/08/2007  . Hypothyroidism 11/18/2006  . ANEMIA-NOS 11/18/2006  . Essential hypertension 11/18/2006  . GERD 11/18/2006    Zannie Cove, PT 01/15/2017, 9:43 AM  [facility] [address] [place], [place], [address] Phone: [phone]   Fax:  [phone]  Name: Kimberly [name] MRN: [id number] Date of Birth: Dec 13, 1950

## 2017-01-16 ENCOUNTER — Encounter: Payer: Self-pay | Admitting: Physical Therapy

## 2017-01-16 ENCOUNTER — Ambulatory Visit: Payer: Medicare Other | Admitting: Physical Therapy

## 2017-01-16 DIAGNOSIS — G8929 Other chronic pain: Secondary | ICD-10-CM | POA: Diagnosis not present

## 2017-01-16 DIAGNOSIS — R2689 Other abnormalities of gait and mobility: Secondary | ICD-10-CM | POA: Diagnosis not present

## 2017-01-16 DIAGNOSIS — R600 Localized edema: Secondary | ICD-10-CM | POA: Diagnosis not present

## 2017-01-16 DIAGNOSIS — M25562 Pain in left knee: Secondary | ICD-10-CM

## 2017-01-16 DIAGNOSIS — M6281 Muscle weakness (generalized): Secondary | ICD-10-CM | POA: Diagnosis not present

## 2017-01-16 DIAGNOSIS — M25662 Stiffness of left knee, not elsewhere classified: Secondary | ICD-10-CM

## 2017-01-16 NOTE — Therapy (Signed)
[facility] [address] [place], [place], [address] Phone: [phone]   Fax:  [phone]  Physical Therapy Treatment  Patient Details  Name: Kimberly [name] MRN: [id number] Date of Birth: 30-Jun-1950 Referring Provider: [name], MD  Encounter Date: 01/16/2017      PT End of Session - 01/16/17 0835    Visit Number 14   Number of Visits 20   Date for PT Re-Evaluation 02/11/17   PT Start Time 0831   PT Stop Time 0940   PT Time Calculation (min) 69 min   Activity Tolerance Patient tolerated treatment well;Patient limited by pain   Behavior During Therapy [facility] for tasks assessed/performed      Past Medical History:  Diagnosis Date  . Anemia 2005   hb 11.5  . Arthritis   . DVT (deep venous thrombosis) ([place])    2003, coumadin x 6 mon  . GERD (gastroesophageal reflux disease) 2001   no problem now  . Hypertension   . Hypothyroidism    off meds  . Neuromuscular disorder ([place])    painful in L foot, being started on Gabapentin- 11/08/2016  . Osteopenia   . Vegetarian diet    eats fish and eggs; no meat    Past Surgical History:  Procedure Laterality Date  . JOINT REPLACEMENT    . KNEE ARTHROSCOPY Right 03/16/2003  . OOPHORECTOMY     "think it was my left"  . TOTAL KNEE ARTHROPLASTY Right 09/14/2014   Procedure: RIGHT TOTAL KNEE ARTHROPLASTY;  Surgeon: Garald Balding, MD;  Location: [place];  Service: Orthopedics;  Laterality: Right;  . TOTAL KNEE ARTHROPLASTY Left 11/20/2016  . TOTAL KNEE ARTHROPLASTY Left 11/20/2016   Procedure: LEFT TOTAL KNEE ARTHROPLASTY;  Surgeon: Garald Balding, MD;  Location: [place];  Service: Orthopedics;  Laterality: Left;    There were no vitals filed for this visit.      Subjective Assessment - 01/16/17 0834    Subjective I hurt too much after last session. I do not want to hurt after my PT.    Currently in Pain? Yes   Pain Score 3    Pain Location Knee   Pain  Orientation Left   Pain Descriptors / Indicators Sore   Aggravating Factors  Overdoing it   Pain Relieving Factors ice   Multiple Pain Sites No                         [facility] Adult PT Treatment/Exercise - 01/16/17 0001      Knee/Hip Exercises: Stretches   Sports administrator Left;3 reps;20 seconds   Quad Stretch Limitations prone with strap   Knee: Self-Stretch to increase Flexion --  20x in supine with ball     Knee/Hip Exercises: Aerobic   Nustep L3 x 10 min  PTA monitored     Knee/Hip Exercises: Standing   Lateral Step Up Left;2 sets;10 reps;Hand Hold: 1;Step Height: 6"   Forward Step Up Left;2 sets;10 reps;Hand Hold: 2;Step Height: 6"   Walking with Sports Cord 15# forward & backward 10x     Knee/Hip Exercises: Seated   Long Arc Quad Strengthening;Left;3 sets;10 reps;Weights  VC to keep LT hip down   [employer] Weight 3 lbs.     Knee/Hip Exercises: Prone   Hamstring Curl 1 set;20 reps     Vasopneumatic   Number Minutes Vasopneumatic  15 minutes   Vasopnuematic Location  Knee   Vasopneumatic  Pressure Medium   Vasopneumatic Temperature  3 flakes     Manual Therapy   Soft tissue mobilization scar mobilization with knee on stretch                  PT Short Term Goals - 01/16/17 5427      PT SHORT TERM GOAL #4   Title improve strength and endurance to stand and walk > or = to 25 minutes to improve community ambulation   Time 4   Period Weeks   Status Partially Met  20-25 min           PT Long Term Goals - 12/28/16 1051      PT LONG TERM GOAL #1   Title be independent in advanced HEP   Time 8   Period Weeks   Status On-going     PT LONG TERM GOAL #2   Title reduce FOTO to < or = to 42% limitation   Time 8   Period Weeks   Status On-going     PT LONG TERM GOAL #3   Title demonstrate Lt knee A/ROM flexion to > or = to 115 degrees to allow for squatting and descending steps without substitution   Time 8   Period Weeks    Status On-going     PT LONG TERM GOAL #4   Title improve Lt knee strength and flexibility to ascend and descend steps with step-over-step gait with use of 1 rail   Time 8   Period Weeks   Status On-going     PT LONG TERM GOAL #5   Title demonstrate 5/5 Lt knee strength with good quad set to improve safety and endurance in the community   Time 8   Period Weeks   Status On-going     PT LONG TERM GOAL #6   Title improve strength and endurance to ambulate for 30-45 minutes without limitation   Time 8   Period Weeks   Status On-going               Plan - 01/16/17 0623    Clinical Impression Statement Pt presented today pretty sore from the last session. We continue to work towards maximizing knee flexion and knee stength. Some difficulty today with the flexion as she was more sore.  Tends to use upper extremities with lateral stepping and descending stairs is still difficcult.    Rehab Potential Good   PT Frequency 3x / week   PT Duration 8 weeks   PT Treatment/Interventions Cryotherapy;ADLs/Self Care Home Management;Functional mobility training;Stair training;Gait training;Ultrasound;Moist Heat;Therapeutic activities;Therapeutic exercise;Balance training;Neuromuscular re-education;Manual techniques;Vasopneumatic Device;Taping   Consulted and Agree with Plan of Care Patient      Patient will benefit from skilled therapeutic intervention in order to improve the following deficits and impairments:  Abnormal gait, Decreased range of motion, Pain, Increased edema, Decreased strength, Decreased scar mobility, Impaired flexibility, Decreased activity tolerance, Decreased endurance  Visit Diagnosis: Acute pain of left knee  Other abnormalities of gait and mobility  Localized edema  Stiffness of left knee, not elsewhere classified  Chronic pain of left knee     Problem List Patient Active Problem List   Diagnosis Date Noted  . S/P total knee replacement using cement, left  11/20/2016  . Osteopenia 02/28/2015  . Left lumbar radiculitis 02/28/2015  . Acute blood loss anemia 09/20/2014  . Primary osteoarthritis of right knee 09/14/2014  . Osteoarthritis of left knee 09/14/2014  . Vitamin B 12 deficiency 07/29/2014  .  Obesity (BMI 30-39.9) 12/08/2013  . Osteoarthritis, knee 05/30/2012  . Metabolic syndrome 39/58/4417  . DVT, HX OF 02/10/2010  . Vitamin D deficiency 08/25/2007  . Disorder of bone and cartilage 04/08/2007  . Hypothyroidism 11/18/2006  . ANEMIA-NOS 11/18/2006  . Essential hypertension 11/18/2006  . GERD 11/18/2006    COCHRAN,JENNIFER, PTA 01/16/2017, 9:24 AM  [facility] [address], [place], [place], [address] Phone: [phone]   Fax:  [phone]  Name: Kimberly [name] MRN: [id number] Date of Birth: Oct 30, 1950

## 2017-01-18 ENCOUNTER — Ambulatory Visit: Payer: Medicare Other | Admitting: Physical Therapy

## 2017-01-18 ENCOUNTER — Encounter: Payer: Self-pay | Admitting: Physical Therapy

## 2017-01-18 DIAGNOSIS — M25562 Pain in left knee: Secondary | ICD-10-CM

## 2017-01-18 DIAGNOSIS — M25662 Stiffness of left knee, not elsewhere classified: Secondary | ICD-10-CM

## 2017-01-18 DIAGNOSIS — R600 Localized edema: Secondary | ICD-10-CM

## 2017-01-18 DIAGNOSIS — R2689 Other abnormalities of gait and mobility: Secondary | ICD-10-CM

## 2017-01-18 DIAGNOSIS — M6281 Muscle weakness (generalized): Secondary | ICD-10-CM | POA: Diagnosis not present

## 2017-01-18 DIAGNOSIS — G8929 Other chronic pain: Secondary | ICD-10-CM | POA: Diagnosis not present

## 2017-01-18 NOTE — Therapy (Signed)
[facility] [address] [place], [place], [address] Phone: [phone]   Fax:  [phone]  Physical Therapy Treatment  Patient Details  Name: Kimberly [name] MRN: [id number] Date of Birth: 30-Sep-1950 Referring Provider: [name], MD  Encounter Date: 01/18/2017      PT End of Session - 01/18/17 0830    Visit Number 15   Number of Visits 20   Date for PT Re-Evaluation 02/11/17   PT Start Time 0830   PT Stop Time 0935   PT Time Calculation (min) 65 min   Activity Tolerance Patient tolerated treatment well   Behavior During Therapy [facility] for tasks assessed/performed      Past Medical History:  Diagnosis Date  . Anemia 2005   hb 11.5  . Arthritis   . DVT (deep venous thrombosis) ([place])    2003, coumadin x 6 mon  . GERD (gastroesophageal reflux disease) 2001   no problem now  . Hypertension   . Hypothyroidism    off meds  . Neuromuscular disorder ([place])    painful in L foot, being started on Gabapentin- 11/08/2016  . Osteopenia   . Vegetarian diet    eats fish and eggs; no meat    Past Surgical History:  Procedure Laterality Date  . JOINT REPLACEMENT    . KNEE ARTHROSCOPY Right 03/16/2003  . OOPHORECTOMY     "think it was my left"  . TOTAL KNEE ARTHROPLASTY Right 09/14/2014   Procedure: RIGHT TOTAL KNEE ARTHROPLASTY;  Surgeon: Garald Balding, MD;  Location: [place];  Service: Orthopedics;  Laterality: Right;  . TOTAL KNEE ARTHROPLASTY Left 11/20/2016  . TOTAL KNEE ARTHROPLASTY Left 11/20/2016   Procedure: LEFT TOTAL KNEE ARTHROPLASTY;  Surgeon: Garald Balding, MD;  Location: [place];  Service: Orthopedics;  Laterality: Left;    There were no vitals filed for this visit.      Subjective Assessment - 01/18/17 0831    Subjective Two good days, had pain last night, took Advil and then it felt better.    Currently in Pain? Yes   Pain Score 1    Pain Location Knee   Pain Orientation Left   Pain  Descriptors / Indicators Dull   Multiple Pain Sites No            [facility] PT Assessment - 01/18/17 0001      AROM   Left Knee Flexion 99                     [facility] Adult PT Treatment/Exercise - 01/18/17 0001      Knee/Hip Exercises: Stretches   Sports administrator Left;3 reps;20 seconds   Quad Stretch Limitations prone with strap   Knee: Self-Stretch to increase Flexion --  20x in supine with ball     Knee/Hip Exercises: Aerobic   Nustep L4 x10 min  PTA monitored status     Knee/Hip Exercises: Machines for Strengthening   Total Gym Leg Press Seat 6; Bil 60# 3x10, LTLE 35# 20x     Knee/Hip Exercises: Standing   Lateral Step Up Left;2 sets;10 reps;Hand Hold: 1;Step Height: 6"   Forward Step Up Left;2 sets;10 reps;Hand Hold: 2;Step Height: 6"     Knee/Hip Exercises: Prone   Hamstring Curl 1 set;20 reps     Vasopneumatic   Number Minutes Vasopneumatic  15 minutes   Vasopnuematic Location  Knee   Vasopneumatic Pressure Medium   Vasopneumatic Temperature  3 flakes  Manual Therapy   Manual therapy comments Soft tissue work to quad and Chief [employer] tissue mobilization scar mobilization with knee on stretch                  PT Short Term Goals - 01/16/17 0922      PT SHORT TERM GOAL #4   Title improve strength and endurance to stand and walk > or = to 25 minutes to improve community ambulation   Time 4   Period Weeks   Status Partially Met  20-25 min           PT Long Term Goals - 12/28/16 1051      PT LONG TERM GOAL #1   Title be independent in advanced HEP   Time 8   Period Weeks   Status On-going     PT LONG TERM GOAL #2   Title reduce FOTO to < or = to 42% limitation   Time 8   Period Weeks   Status On-going     PT LONG TERM GOAL #3   Title demonstrate Lt knee A/ROM flexion to > or = to 115 degrees to allow for squatting and descending steps without substitution   Time 8   Period Weeks   Status On-going     PT LONG TERM  GOAL #4   Title improve Lt knee strength and flexibility to ascend and descend steps with step-over-step gait with use of 1 rail   Time 8   Period Weeks   Status On-going     PT LONG TERM GOAL #5   Title demonstrate 5/5 Lt knee strength with good quad set to improve safety and endurance in the community   Time 8   Period Weeks   Status On-going     PT LONG TERM GOAL #6   Title improve strength and endurance to ambulate for 30-45 minutes without limitation   Time 8   Period Weeks   Status On-going               Plan - 01/18/17 0831    Clinical Impression Statement Pt demonstrated less hip compensations during the leg press and prone exercises. Knee flexion measured 99 degrees today. The number is not more but pt achieved this measurement with greater ewase today.  Pt has recently started taking an Advil daily and she thinks this really helps.    Rehab Potential Good   PT Frequency 3x / week   PT Duration 8 weeks   PT Next Visit Plan flexion ROM,, LE strength   Consulted and Agree with Plan of Care --      Patient will benefit from skilled therapeutic intervention in order to improve the following deficits and impairments:  Abnormal gait, Decreased range of motion, Pain, Increased edema, Decreased strength, Decreased scar mobility, Impaired flexibility, Decreased activity tolerance, Decreased endurance  Visit Diagnosis: Acute pain of left knee  Other abnormalities of gait and mobility  Localized edema  Stiffness of left knee, not elsewhere classified     Problem List Patient Active Problem List   Diagnosis Date Noted  . S/P total knee replacement using cement, left 11/20/2016  . Osteopenia 02/28/2015  . Left lumbar radiculitis 02/28/2015  . Acute blood loss anemia 09/20/2014  . Primary osteoarthritis of right knee 09/14/2014  . Osteoarthritis of left knee 09/14/2014  . Vitamin B 12 deficiency 07/29/2014  . Obesity (BMI 30-39.9) 12/08/2013  . Osteoarthritis,  knee 05/30/2012  . Metabolic syndrome  04/08/2012  . DVT, HX OF 02/10/2010  . Vitamin D deficiency 08/25/2007  . Disorder of bone and cartilage 04/08/2007  . Hypothyroidism 11/18/2006  . ANEMIA-NOS 11/18/2006  . Essential hypertension 11/18/2006  . GERD 11/18/2006    COCHRAN,JENNIFER, PTA 01/18/2017, 9:17 AM  [facility] [address] [place], [place], [address] Phone: [phone]   Fax:  [phone]  Name: Akeila Lana MRN: [id number] Date of Birth: June 11, 1950

## 2017-01-21 ENCOUNTER — Ambulatory Visit: Payer: Medicare Other

## 2017-01-21 DIAGNOSIS — R600 Localized edema: Secondary | ICD-10-CM

## 2017-01-21 DIAGNOSIS — R2689 Other abnormalities of gait and mobility: Secondary | ICD-10-CM | POA: Diagnosis not present

## 2017-01-21 DIAGNOSIS — M25662 Stiffness of left knee, not elsewhere classified: Secondary | ICD-10-CM | POA: Diagnosis not present

## 2017-01-21 DIAGNOSIS — M25562 Pain in left knee: Secondary | ICD-10-CM | POA: Diagnosis not present

## 2017-01-21 DIAGNOSIS — M6281 Muscle weakness (generalized): Secondary | ICD-10-CM | POA: Diagnosis not present

## 2017-01-21 DIAGNOSIS — G8929 Other chronic pain: Secondary | ICD-10-CM | POA: Diagnosis not present

## 2017-01-21 NOTE — Therapy (Signed)
[facility] [address] [place], [place], [address] Phone: [phone]   Fax:  [phone]  Physical Therapy Treatment  Patient Details  Name: Kimberly [name] MRN: [id number] Date of Birth: April 01, 1951 Referring Provider: [name], MD  Encounter Date: 01/21/2017      PT End of Session - 01/21/17 1013    Visit Number 16   Number of Visits 20   PT Start Time 0928   PT Stop Time 1028   PT Time Calculation (min) 60 min   Activity Tolerance Patient tolerated treatment well   Behavior During Therapy [facility] for tasks assessed/performed      Past Medical History:  Diagnosis Date  . Anemia 2005   hb 11.5  . Arthritis   . DVT (deep venous thrombosis) ([place])    2003, coumadin x 6 mon  . GERD (gastroesophageal reflux disease) 2001   no problem now  . Hypertension   . Hypothyroidism    off meds  . Neuromuscular disorder ([place])    painful in L foot, being started on Gabapentin- 11/08/2016  . Osteopenia   . Vegetarian diet    eats fish and eggs; no meat    Past Surgical History:  Procedure Laterality Date  . JOINT REPLACEMENT    . KNEE ARTHROSCOPY Right 03/16/2003  . OOPHORECTOMY     "think it was my left"  . TOTAL KNEE ARTHROPLASTY Right 09/14/2014   Procedure: RIGHT TOTAL KNEE ARTHROPLASTY;  Surgeon: Garald Balding, MD;  Location: [place];  Service: Orthopedics;  Laterality: Right;  . TOTAL KNEE ARTHROPLASTY Left 11/20/2016  . TOTAL KNEE ARTHROPLASTY Left 11/20/2016   Procedure: LEFT TOTAL KNEE ARTHROPLASTY;  Surgeon: Garald Balding, MD;  Location: [place];  Service: Orthopedics;  Laterality: Left;    There were no vitals filed for this visit.      Subjective Assessment - 01/21/17 0938    Subjective I am doing my exercises at home.  A sat a lot yesterday so my knee was stiff.     Patient Stated Goals improve Lt knee A/ROM, improve endurance, imrpove gait   Currently in Pain? No/denies                          [facility] Adult PT Treatment/Exercise - 01/21/17 0001      Knee/Hip Exercises: Aerobic   Stationary Bike Level 1 x 5 minutes    Nustep L4 x10 min  PT monitored status     Knee/Hip Exercises: Machines for Strengthening   Total Gym Leg Press Seat 6; Bil 60# 3x10, LTLE 35# 20x     Knee/Hip Exercises: Standing   Knee Flexion Strengthening;Left;2 sets;10 reps   Knee Flexion Limitations 3# added    Lateral Step Up Left;2 sets;10 reps;Hand Hold: 1;Step Height: 6"   Forward Step Up Left;2 sets;10 reps;Hand Hold: 2;Step Height: 6"   Walking with Sports Cord 15# forward & backward 10x     Knee/Hip Exercises: Seated   Long Arc Quad Strengthening;Left;3 sets;10 reps;Weights  VC to keep LT hip down   Long Arc Quad Weight 3 lbs.     Vasopneumatic   Number Minutes Vasopneumatic  15 minutes   Vasopnuematic Location  Knee   Vasopneumatic Pressure Medium   Vasopneumatic Temperature  3 flakes     Manual Therapy   Manual therapy comments --   Soft tissue mobilization --  PT Short Term Goals - 01/21/17 0939      PT SHORT TERM GOAL #4   Title improve strength and endurance to stand and walk > or = to 25 minutes to improve community ambulation   Baseline 15 minutes   Time 4   Period Weeks   Status On-going           PT Long Term Goals - 01/21/17 1610      PT LONG TERM GOAL #1   Title be independent in advanced HEP   Time 8   Period Weeks   Status On-going     PT LONG TERM GOAL #3   Title demonstrate Lt knee A/ROM flexion to > or = to 115 degrees to allow for squatting and descending steps without substitution   Baseline 99 last visit   Time 8   Period Weeks   Status On-going     PT LONG TERM GOAL #4   Title improve Lt knee strength and flexibility to ascend and descend steps with step-over-step gait with use of 1 rail   Time 8   Period Weeks   Status On-going               Plan - 01/21/17 0944     Clinical Impression Statement Pt is progressing s/p total knee replacement.  Pt with improving Lt knee A/ROM and had 99 degrees last session.  Pt is limited to walking 15 minutes due to pain and fatigue.  Pt is able to ascend and descend steps with step gait with substitution due to pain.  Pt will continue to benefit from skilled  PT for LT strength, A/ROM, gait, and edema management.       PT Frequency 3x / week   PT Duration 8 weeks   PT Treatment/Interventions Cryotherapy;ADLs/Self Care Home Management;Functional mobility training;Stair training;Gait training;Ultrasound;Moist Heat;Therapeutic activities;Therapeutic exercise;Balance training;Neuromuscular re-education;Manual techniques;Vasopneumatic Device;Taping   PT Next Visit Plan flexion ROM, LE strength, edema management   Consulted and Agree with Plan of Care Patient      Patient will benefit from skilled therapeutic intervention in order to improve the following deficits and impairments:  Abnormal gait, Decreased range of motion, Pain, Increased edema, Decreased strength, Decreased scar mobility, Impaired flexibility, Decreased activity tolerance, Decreased endurance  Visit Diagnosis: Acute pain of left knee  Other abnormalities of gait and mobility  Localized edema  Stiffness of left knee, not elsewhere classified     Problem List Patient Active Problem List   Diagnosis Date Noted  . S/P total knee replacement using cement, left 11/20/2016  . Osteopenia 02/28/2015  . Left lumbar radiculitis 02/28/2015  . Acute blood loss anemia 09/20/2014  . Primary osteoarthritis of right knee 09/14/2014  . Osteoarthritis of left knee 09/14/2014  . Vitamin B 12 deficiency 07/29/2014  . Obesity (BMI 30-39.9) 12/08/2013  . Osteoarthritis, knee 05/30/2012  . Metabolic syndrome 96/08/5407  . DVT, HX OF 02/10/2010  . Vitamin D deficiency 08/25/2007  . Disorder of bone and cartilage 04/08/2007  . Hypothyroidism 11/18/2006  . ANEMIA-NOS  11/18/2006  . Essential hypertension 11/18/2006  . GERD 11/18/2006     Sigurd Sos, PT 01/21/17 10:15 AM  [facility] [address] [place], [place], [address] Phone: [phone]   Fax:  [phone]  Name: Kimberly [name] MRN: [id number] Date of Birth: 04-19-1951

## 2017-01-23 ENCOUNTER — Ambulatory Visit: Payer: Medicare Other | Admitting: Physical Therapy

## 2017-01-23 ENCOUNTER — Encounter: Payer: Self-pay | Admitting: Physical Therapy

## 2017-01-23 DIAGNOSIS — M6281 Muscle weakness (generalized): Secondary | ICD-10-CM | POA: Diagnosis not present

## 2017-01-23 DIAGNOSIS — G8929 Other chronic pain: Secondary | ICD-10-CM

## 2017-01-23 DIAGNOSIS — R2689 Other abnormalities of gait and mobility: Secondary | ICD-10-CM | POA: Diagnosis not present

## 2017-01-23 DIAGNOSIS — M25662 Stiffness of left knee, not elsewhere classified: Secondary | ICD-10-CM

## 2017-01-23 DIAGNOSIS — R600 Localized edema: Secondary | ICD-10-CM

## 2017-01-23 DIAGNOSIS — M25562 Pain in left knee: Secondary | ICD-10-CM

## 2017-01-23 NOTE — Therapy (Signed)
[facility] [address] [place], [place], [address] Phone: [phone]   Fax:  [phone]  Physical Therapy Treatment  Patient Details  Name: Kimberly [name] MRN: [id number] Date of Birth: [birth year] Referring Provider: [name], MD  Encounter Date: 01/23/2017      PT End of Session - 01/23/17 0843    Visit Number 17   Number of Visits 20   Date for PT Re-Evaluation 02/11/17   PT Start Time 0841   PT Stop Time 0945   PT Time Calculation (min) 64 min   Activity Tolerance Patient tolerated treatment well   Behavior During Therapy [facility] for tasks assessed/performed      Past Medical History:  Diagnosis Date  . Anemia 2005   hb 11.5  . Arthritis   . DVT (deep venous thrombosis) ([place])    2003, coumadin x 6 mon  . GERD (gastroesophageal reflux disease) 2001   no problem now  . Hypertension   . Hypothyroidism    off meds  . Neuromuscular disorder ([place])    painful in L foot, being started on Gabapentin- 11/08/2016  . Osteopenia   . Vegetarian diet    eats fish and eggs; no meat    Past Surgical History:  Procedure Laterality Date  . JOINT REPLACEMENT    . KNEE ARTHROSCOPY Right 03/16/2003  . OOPHORECTOMY     "think it was my left"  . TOTAL KNEE ARTHROPLASTY Right 09/14/2014   Procedure: RIGHT TOTAL KNEE ARTHROPLASTY;  Surgeon: Garald Balding, MD;  Location: [place];  Service: Orthopedics;  Laterality: Right;  . TOTAL KNEE ARTHROPLASTY Left 11/20/2016  . TOTAL KNEE ARTHROPLASTY Left 11/20/2016   Procedure: LEFT TOTAL KNEE ARTHROPLASTY;  Surgeon: Garald Balding, MD;  Location: [place];  Service: Orthopedics;  Laterality: Left;    There were no vitals filed for this visit.      Subjective Assessment - 01/23/17 0845    Subjective No new complaints this AM. My calf is a littel sore.    Currently in Pain? No/denies   Multiple Pain Sites No            [facility] PT Assessment - 01/23/17 0001      AROM   Left Knee Flexion 100                     [facility] Adult PT Treatment/Exercise - 01/23/17 0001      Knee/Hip Exercises: Stretches   Gastroc Stretch Both;3 reps;30 seconds     Knee/Hip Exercises: Aerobic   Nustep L4 x10 min  PT monitored status     Knee/Hip Exercises: Machines for Strengthening   Cybex Knee Flexion Bil 20# 2x20   Total Gym Leg Press Seat 6: Bil 60# 3x10, Single 35# 3x10     Knee/Hip Exercises: Standing   Lateral Step Up Left;2 sets;10 reps;Hand Hold: 1;Step Height: 6"   Forward Step Up Left;2 sets;10 reps;Hand Hold: 2;Step Height: 6"   Walking with Sports Cord 15# forward & backward 10x     Vasopneumatic   Number Minutes Vasopneumatic  15 minutes   Vasopnuematic Location  Knee   Vasopneumatic Pressure Medium   Vasopneumatic Temperature  3 flakes     Manual Therapy   Manual therapy comments Soft tissue work to quad and Chief Financial Officer tissue mobilization scar mobilization with knee on stretch  PT Short Term Goals - 01/21/17 0939      PT SHORT TERM GOAL #4   Title improve strength and endurance to stand and walk > or = to 25 minutes to improve community ambulation   Baseline 15 minutes   Time 4   Period Weeks   Status On-going           PT Long Term Goals - 01/21/17 1610      PT LONG TERM GOAL #1   Title be independent in advanced HEP   Time 8   Period Weeks   Status On-going     PT LONG TERM GOAL #3   Title demonstrate Lt knee A/ROM flexion to > or = to 115 degrees to allow for squatting and descending steps without substitution   Baseline 99 last visit   Time 8   Period Weeks   Status On-going     PT LONG TERM GOAL #4   Title improve Lt knee strength and flexibility to ascend and descend steps with step-over-step gait with use of 1 rail   Time 8   Period Weeks   Status On-going               Plan - 01/23/17 0844    Clinical Impression Statement Continues to progress well. Pt  reporting more consistent days where she does not limp and is very pleased with this.  Pt measured 100 degrees of knee flexion with ease today.    Rehab Potential Good   PT Frequency 3x / week   PT Duration 8 weeks   PT Treatment/Interventions Cryotherapy;ADLs/Self Care Home Management;Functional mobility training;Stair training;Gait training;Ultrasound;Moist Heat;Therapeutic activities;Therapeutic exercise;Balance training;Neuromuscular re-education;Manual techniques;Vasopneumatic Device;Taping   PT Next Visit Plan flexion ROM, LE strength, edema management   Consulted and Agree with Plan of Care --      Patient will benefit from skilled therapeutic intervention in order to improve the following deficits and impairments:  Abnormal gait, Decreased range of motion, Pain, Increased edema, Decreased strength, Decreased scar mobility, Impaired flexibility, Decreased activity tolerance, Decreased endurance  Visit Diagnosis: Acute pain of left knee  Other abnormalities of gait and mobility  Localized edema  Stiffness of left knee, not elsewhere classified  Chronic pain of left knee  Muscle weakness (generalized)     Problem List Patient Active Problem List   Diagnosis Date Noted  . S/P total knee replacement using cement, left 11/20/2016  . Osteopenia 02/28/2015  . Left lumbar radiculitis 02/28/2015  . Acute blood loss anemia 09/20/2014  . Primary osteoarthritis of right knee 09/14/2014  . Osteoarthritis of left knee 09/14/2014  . Vitamin B 12 deficiency 07/29/2014  . Obesity (BMI 30-39.9) 12/08/2013  . Osteoarthritis, knee 05/30/2012  . Metabolic syndrome 96/08/5407  . DVT, HX OF 02/10/2010  . Vitamin D deficiency 08/25/2007  . Disorder of bone and cartilage 04/08/2007  . Hypothyroidism 11/18/2006  . ANEMIA-NOS 11/18/2006  . Essential hypertension 11/18/2006  . GERD 11/18/2006    Rosslyn Pasion, PTA 01/23/2017, 9:36 AM  [facility] [address], [place], [place], [address] Phone: [phone]   Fax:  [phone]  Name: Kimberly [name] MRN: [id number] Date of Birth: 1950/11/08

## 2017-01-25 ENCOUNTER — Encounter: Payer: Self-pay | Admitting: Physical Therapy

## 2017-01-25 ENCOUNTER — Ambulatory Visit: Payer: Medicare Other | Admitting: Physical Therapy

## 2017-01-25 DIAGNOSIS — R600 Localized edema: Secondary | ICD-10-CM

## 2017-01-25 DIAGNOSIS — M6281 Muscle weakness (generalized): Secondary | ICD-10-CM

## 2017-01-25 DIAGNOSIS — G8929 Other chronic pain: Secondary | ICD-10-CM

## 2017-01-25 DIAGNOSIS — M25562 Pain in left knee: Secondary | ICD-10-CM | POA: Diagnosis not present

## 2017-01-25 DIAGNOSIS — M25662 Stiffness of left knee, not elsewhere classified: Secondary | ICD-10-CM

## 2017-01-25 DIAGNOSIS — R2689 Other abnormalities of gait and mobility: Secondary | ICD-10-CM

## 2017-01-25 NOTE — Therapy (Signed)
[facility] [address], [place], [place], [address] Phone: [phone]   Fax:  [phone]  Physical Therapy Treatment  Patient Details  Name: Kimberly [name] MRN: [id number] Date of Birth: 01/07/1951 Referring Provider: [name], MD  Encounter Date: 01/25/2017      PT End of Session - 01/25/17 0846    Visit Number 18   Number of Visits 20   Date for PT Re-Evaluation 02/11/17   PT Start Time 0845   PT Stop Time 0940   PT Time Calculation (min) 55 min   Activity Tolerance Patient tolerated treatment well   Behavior During Therapy [facility] for tasks assessed/performed      Past Medical History:  Diagnosis Date  . Anemia 2005   hb 11.5  . Arthritis   . DVT (deep venous thrombosis) ([place])    2003, coumadin x 6 mon  . GERD (gastroesophageal reflux disease) 2001   no problem now  . Hypertension   . Hypothyroidism    off meds  . Neuromuscular disorder ([place])    painful in L foot, being started on Gabapentin- 11/08/2016  . Osteopenia   . Vegetarian diet    eats fish and eggs; no meat    Past Surgical History:  Procedure Laterality Date  . JOINT REPLACEMENT    . KNEE ARTHROSCOPY Right 03/16/2003  . OOPHORECTOMY     "think it was my left"  . TOTAL KNEE ARTHROPLASTY Right 09/14/2014   Procedure: RIGHT TOTAL KNEE ARTHROPLASTY;  Surgeon: Garald Balding, MD;  Location: [place];  Service: Orthopedics;  Laterality: Right;  . TOTAL KNEE ARTHROPLASTY Left 11/20/2016  . TOTAL KNEE ARTHROPLASTY Left 11/20/2016   Procedure: LEFT TOTAL KNEE ARTHROPLASTY;  Surgeon: Garald Balding, MD;  Location: [place];  Service: Orthopedics;  Laterality: Left;    There were no vitals filed for this visit.      Subjective Assessment - 01/25/17 0847    Subjective I took a break from exercises yesterday. I needed a rest day.    Currently in Pain? Yes   Pain Score 1    Pain Location Knee   Pain Orientation Left   Pain Descriptors  / Indicators Dull   Aggravating Factors  Nonspecific   Pain Relieving Factors ice/Game Ready   Multiple Pain Sites No                         [facility] Adult PT Treatment/Exercise - 01/25/17 0001      Knee/Hip Exercises: Stretches   Knee: Self-Stretch to increase Flexion --  supine on ball 20x post manual     Knee/Hip Exercises: Aerobic   Stationary Bike L1 x 10 min   Nustep L4 x 10 min     Knee/Hip Exercises: Machines for Strengthening   Total Gym Leg Press Seat 6: Bil 65# 3x10, LTLE 35# 20x     Knee/Hip Exercises: Standing   Lateral Step Up Left;2 sets;10 reps;Hand Hold: 1;Step Height: 6"   Forward Step Up Left;2 sets;10 reps;Hand Hold: 2;Step Height: 6"   Walking with Sports Cord 20# forward and back 10x     Vasopneumatic   Number Minutes Vasopneumatic  15 minutes   Vasopnuematic Location  Knee   Vasopneumatic Pressure Medium   Vasopneumatic Temperature  3 flakes     Manual Therapy   Manual therapy comments Soft tissue work to quad and Chief Financial Officer tissue mobilization scar mobilization with knee on  stretch                  PT Short Term Goals - 01/21/17 0939      PT SHORT TERM GOAL #4   Title improve strength and endurance to stand and walk > or = to 25 minutes to improve community ambulation   Baseline 15 minutes   Time 4   Period Weeks   Status On-going           PT Long Term Goals - 01/21/17 7494      PT LONG TERM GOAL #1   Title be independent in advanced HEP   Time 8   Period Weeks   Status On-going     PT LONG TERM GOAL #3   Title demonstrate Lt knee A/ROM flexion to > or = to 115 degrees to allow for squatting and descending steps without substitution   Baseline 99 last visit   Time 8   Period Weeks   Status On-going     PT LONG TERM GOAL #4   Title improve Lt knee strength and flexibility to ascend and descend steps with step-over-step gait with use of 1 rail   Time 8   Period Weeks   Status On-going                Plan - 01/25/17 0846    Clinical Impression Statement Pt reporting feeling good with her functional activities including sleeping. No sleep interrupptions. Soft tissues feel softer at the quads and hamstrings distal scar remians with some stickiness.  Mild swelling remains.    Rehab Potential Good   PT Frequency 3x / week   PT Duration 8 weeks   PT Next Visit Plan flexion ROM, LE strength, edema management   Consulted and Agree with Plan of Care Patient      Patient will benefit from skilled therapeutic intervention in order to improve the following deficits and impairments:  Abnormal gait, Decreased range of motion, Pain, Increased edema, Decreased strength, Decreased scar mobility, Impaired flexibility, Decreased activity tolerance, Decreased endurance  Visit Diagnosis: Acute pain of left knee  Other abnormalities of gait and mobility  Localized edema  Stiffness of left knee, not elsewhere classified  Chronic pain of left knee  Muscle weakness (generalized)     Problem List Patient Active Problem List   Diagnosis Date Noted  . S/P total knee replacement using cement, left 11/20/2016  . Osteopenia 02/28/2015  . Left lumbar radiculitis 02/28/2015  . Acute blood loss anemia 09/20/2014  . Primary osteoarthritis of right knee 09/14/2014  . Osteoarthritis of left knee 09/14/2014  . Vitamin B 12 deficiency 07/29/2014  . Obesity (BMI 30-39.9) 12/08/2013  . Osteoarthritis, knee 05/30/2012  . Metabolic syndrome 49/67/5916  . DVT, HX OF 02/10/2010  . Vitamin D deficiency 08/25/2007  . Disorder of bone and cartilage 04/08/2007  . Hypothyroidism 11/18/2006  . ANEMIA-NOS 11/18/2006  . Essential hypertension 11/18/2006  . GERD 11/18/2006    COCHRAN,JENNIFER, PTA 01/25/2017, 9:26 AM  [facility] [address] [place], [place], [address] Phone: [phone]   Fax:  [phone]  Name: Lesette Frary MRN: [id number] Date of Birth: 1950/07/09

## 2017-01-28 ENCOUNTER — Encounter: Payer: Self-pay | Admitting: Physical Therapy

## 2017-01-28 ENCOUNTER — Ambulatory Visit: Payer: Medicare Other | Admitting: Physical Therapy

## 2017-01-28 DIAGNOSIS — R2689 Other abnormalities of gait and mobility: Secondary | ICD-10-CM

## 2017-01-28 DIAGNOSIS — M25562 Pain in left knee: Secondary | ICD-10-CM

## 2017-01-28 DIAGNOSIS — M25662 Stiffness of left knee, not elsewhere classified: Secondary | ICD-10-CM

## 2017-01-28 DIAGNOSIS — R600 Localized edema: Secondary | ICD-10-CM

## 2017-01-28 DIAGNOSIS — M6281 Muscle weakness (generalized): Secondary | ICD-10-CM | POA: Diagnosis not present

## 2017-01-28 DIAGNOSIS — G8929 Other chronic pain: Secondary | ICD-10-CM | POA: Diagnosis not present
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
## 2017-01-30 ENCOUNTER — Ambulatory Visit: Payer: Medicare Other | Admitting: Physical Therapy

## 2017-01-30 ENCOUNTER — Encounter: Payer: Self-pay | Admitting: Physical Therapy

## 2017-01-30 DIAGNOSIS — R600 Localized edema: Secondary | ICD-10-CM

## 2017-01-30 DIAGNOSIS — M25562 Pain in left knee: Secondary | ICD-10-CM

## 2017-01-30 DIAGNOSIS — M25662 Stiffness of left knee, not elsewhere classified: Secondary | ICD-10-CM

## 2017-01-30 DIAGNOSIS — G8929 Other chronic pain: Secondary | ICD-10-CM | POA: Diagnosis not present

## 2017-01-30 DIAGNOSIS — R2689 Other abnormalities of gait and mobility: Secondary | ICD-10-CM

## 2017-01-30 DIAGNOSIS — M6281 Muscle weakness (generalized): Secondary | ICD-10-CM | POA: Diagnosis not present

## 2017-01-30 NOTE — Therapy (Addendum)
[facility] [address] [place], [place], [address] Phone: [phone]   Fax:  [phone]  Physical Therapy Treatment  Patient Details  Name: Kimberly [name] MRN: [id number] Date of Birth: 26-Apr-1951 Referring Provider: [name], MD  Encounter Date: 01/30/2017      PT End of Session - 01/30/17 0851    Visit Number 20   Number of Visits 30   Date for PT Re-Evaluation 02/11/17   PT Start Time 0840   PT Stop Time 0955   PT Time Calculation (min) 75 min   Activity Tolerance Patient tolerated treatment well   Behavior During Therapy [facility] for tasks assessed/performed      Past Medical History:  Diagnosis Date  . Anemia 2005   hb 11.5  . Arthritis   . DVT (deep venous thrombosis) ([place])    2003, coumadin x 6 mon  . GERD (gastroesophageal reflux disease) 2001   no problem now  . Hypertension   . Hypothyroidism    off meds  . Neuromuscular disorder ([place])    painful in L foot, being started on Gabapentin- 11/08/2016  . Osteopenia   . Vegetarian diet    eats fish and eggs; no meat    Past Surgical History:  Procedure Laterality Date  . JOINT REPLACEMENT    . KNEE ARTHROSCOPY Right 03/16/2003  . OOPHORECTOMY     "think it was my left"  . TOTAL KNEE ARTHROPLASTY Right 09/14/2014   Procedure: RIGHT TOTAL KNEE ARTHROPLASTY;  Surgeon: Garald Balding, MD;  Location: [place];  Service: Orthopedics;  Laterality: Right;  . TOTAL KNEE ARTHROPLASTY Left 11/20/2016  . TOTAL KNEE ARTHROPLASTY Left 11/20/2016   Procedure: LEFT TOTAL KNEE ARTHROPLASTY;  Surgeon: Garald Balding, MD;  Location: [place];  Service: Orthopedics;  Laterality: Left;    There were no vitals filed for this visit.      Subjective Assessment - 01/30/17 0853    Subjective My knee is still stiff and sore from the other day.   Currently in Pain? Yes   Pain Score 3    Pain Location Knee   Pain Orientation Left   Aggravating Factors  Bending  quick   Pain Relieving Factors ice, Game ready   Multiple Pain Sites No            [facility] PT Assessment - 01/30/17 0001      Observation/Other Assessments   Focus on Therapeutic Outcomes (FOTO)  36% limitations CJ Goal CK                     [facility] Adult PT Treatment/Exercise - 01/30/17 0001      Knee/Hip Exercises: Aerobic   Stationary Bike L1 x 7 min  Stiff at first, improved as she rode.   Nustep L4 x 10 min     Knee/Hip Exercises: Machines for Strengthening   Cybex Knee Flexion Bil 25# 2x10   Total Gym Leg Press Seat 6: Bil 65# 3x10, LTLE 35# 20x  35# still ok for single leg     Knee/Hip Exercises: Standing   Lateral Step Up Left;2 sets;10 reps;Hand Hold: 1;Step Height: 6"   Forward Step Up Left;2 sets;10 reps;Hand Hold: 2;Step Height: 6"   Walking with Sports Cord 25# forward/backward 10x     Vasopneumatic   Number Minutes Vasopneumatic  15 minutes   Vasopnuematic Location  Knee   Vasopneumatic Pressure Medium   Vasopneumatic Temperature  3 flakes  Manual Therapy   Manual therapy comments Soft tissue work to quad and Chief Financial Officer tissue mobilization scar mobilization with knee on stretch                  PT Short Term Goals - 01/28/17 0927      PT SHORT TERM GOAL #4   Title improve strength and endurance to stand and walk > or = to 25 minutes to improve community ambulation   Time 4   Period Weeks   Status Achieved           PT Long Term Goals - 01/21/17 6295      PT LONG TERM GOAL #1   Title be independent in advanced HEP   Time 8   Period Weeks   Status On-going     PT LONG TERM GOAL #3   Title demonstrate Lt knee A/ROM flexion to > or = to 115 degrees to allow for squatting and descending steps without substitution   Baseline 99 last visit   Time 8   Period Weeks   Status On-going     PT LONG TERM GOAL #4   Title improve Lt knee strength and flexibility to ascend and descend steps with step-over-step gait  with use of 1 rail   Time 8   Period Weeks   Status On-going               Plan - 02-26-17 2841    Clinical Impression Statement Pt again presents with more edema this week. Game ready helps reduce the edema. Pt was able to increase the resistance some on the resisted walking and hamstring curls. Pt had a limping gait due to edema and soreness.    Rehab Potential Good   PT Frequency 3x / week   PT Duration 8 weeks   PT Treatment/Interventions Cryotherapy;ADLs/Self Care Home Management;Functional mobility training;Stair training;Gait training;Ultrasound;Moist Heat;Therapeutic activities;Therapeutic exercise;Balance training;Neuromuscular re-education;Manual techniques;Vasopneumatic Device;Taping   PT Next Visit Plan flexion ROM, LE strength, edema management:    Consulted and Agree with Plan of Care Patient      Patient will benefit from skilled therapeutic intervention in order to improve the following deficits and impairments:  Abnormal gait, Decreased range of motion, Pain, Increased edema, Decreased strength, Decreased scar mobility, Impaired flexibility, Decreased activity tolerance, Decreased endurance  Visit Diagnosis: Acute pain of left knee  Localized edema  Other abnormalities of gait and mobility  Stiffness of left knee, not elsewhere classified  Chronic pain of left knee       G-Codes - 02/26/2017 0948    Functional Assessment Tool Used (Outpatient Only) 36% limitation   Functional Limitation Mobility: Walking and moving around   Mobility: Walking and Moving Around Current Status (L2440) At least 20 percent but less than 40 percent impaired, limited or restricted   Mobility: Walking and Moving Around Goal Status [id number]) At least 20 percent but less than 40 percent impaired, limited or restricted    Sigurd Sos, PT Feb 26, 2017 9:49 AM   Problem List Patient Active Problem List   Diagnosis Date Noted  . S/P total knee replacement using cement, left  11/20/2016  . Osteopenia 02/28/2015  . Left lumbar radiculitis 02/28/2015  . Acute blood loss anemia 09/20/2014  . Primary osteoarthritis of right knee 09/14/2014  . Osteoarthritis of left knee 09/14/2014  . Vitamin B 12 deficiency 07/29/2014  . Obesity (BMI 30-39.9) 12/08/2013  . Osteoarthritis, knee 05/30/2012  . Metabolic syndrome 53/66/4403  . DVT,  HX OF 02/10/2010  . Vitamin D deficiency 08/25/2007  . Disorder of bone and cartilage 04/08/2007  . Hypothyroidism 11/18/2006  . ANEMIA-NOS 11/18/2006  . Essential hypertension 11/18/2006  . GERD 11/18/2006    Myrene Galas, PTA 01/30/17 9:48 AM  [facility] [address] [place], [place], [address] Phone: [phone]   Fax:  [phone]  Name: Kimberly [name] MRN: [id number] Date of Birth: 1950/11/02

## 2017-02-01 ENCOUNTER — Encounter: Payer: Self-pay | Admitting: Physical Therapy

## 2017-02-01 ENCOUNTER — Ambulatory Visit: Payer: Medicare Other | Admitting: Physical Therapy

## 2017-02-01 DIAGNOSIS — M25562 Pain in left knee: Secondary | ICD-10-CM | POA: Diagnosis not present

## 2017-02-01 DIAGNOSIS — R600 Localized edema: Secondary | ICD-10-CM

## 2017-02-01 DIAGNOSIS — G8929 Other chronic pain: Secondary | ICD-10-CM

## 2017-02-01 DIAGNOSIS — M25662 Stiffness of left knee, not elsewhere classified: Secondary | ICD-10-CM | POA: Diagnosis not present

## 2017-02-01 DIAGNOSIS — M6281 Muscle weakness (generalized): Secondary | ICD-10-CM | POA: Diagnosis not present

## 2017-02-01 DIAGNOSIS — R2689 Other abnormalities of gait and mobility: Secondary | ICD-10-CM

## 2017-02-01 NOTE — Therapy (Signed)
[facility] [address], [place], [place], [address] Phone: [phone]   Fax:  [phone]  Physical Therapy Treatment  Patient Details  Name: Kimberly [name] MRN: [id number] Date of Birth: 1950-12-08 Referring Provider: [name], MD  Encounter Date: 02/01/2017      PT End of Session - 02/01/17 0849    Visit Number 21   Number of Visits 30   Date for PT Re-Evaluation 02/11/17   PT Start Time 0835   PT Stop Time 0945   PT Time Calculation (min) 70 min   Activity Tolerance Patient tolerated treatment well   Behavior During Therapy [facility] for tasks assessed/performed      Past Medical History:  Diagnosis Date  . Anemia 2005   hb 11.5  . Arthritis   . DVT (deep venous thrombosis) ([place])    2003, coumadin x 6 mon  . GERD (gastroesophageal reflux disease) 2001   no problem now  . Hypertension   . Hypothyroidism    off meds  . Neuromuscular disorder ([place])    painful in L foot, being started on Gabapentin- 11/08/2016  . Osteopenia   . Vegetarian diet    eats fish and eggs; no meat    Past Surgical History:  Procedure Laterality Date  . JOINT REPLACEMENT    . KNEE ARTHROSCOPY Right 03/16/2003  . OOPHORECTOMY     "think it was my left"  . TOTAL KNEE ARTHROPLASTY Right 09/14/2014   Procedure: RIGHT TOTAL KNEE ARTHROPLASTY;  Surgeon: Garald Balding, MD;  Location: [place];  Service: Orthopedics;  Laterality: Right;  . TOTAL KNEE ARTHROPLASTY Left 11/20/2016  . TOTAL KNEE ARTHROPLASTY Left 11/20/2016   Procedure: LEFT TOTAL KNEE ARTHROPLASTY;  Surgeon: Garald Balding, MD;  Location: [place];  Service: Orthopedics;  Laterality: Left;    There were no vitals filed for this visit.      Subjective Assessment - 02/01/17 0850    Subjective My knee is less swollen today and feels better than Wednesday. Pt joined an water exercise class on Wednesday. She is excited about this.    Currently in Pain? Yes   Pain  Score 1    Pain Location Knee   Pain Orientation Left   Pain Descriptors / Indicators Dull   Multiple Pain Sites No            [facility] PT Assessment - 02/01/17 0001      AROM   Left Knee Flexion 100     PROM   Left Knee Flexion 102                     [facility] Adult PT Treatment/Exercise - 02/01/17 0001      Knee/Hip Exercises: Aerobic   Stationary Bike L1 x 10 min   Nustep L4 x 10 min     Knee/Hip Exercises: Machines for Strengthening   Cybex Knee Flexion Bil 25# 2x10   Total Gym Leg Press Seat 6; Bil 65# x10 then 70# 2x10: LTLE 35# 2x10  tried 40# but this was too heavy     Knee/Hip Exercises: Standing   SLS # x20 sec on level ground   Walking with Sports Cord 25# forward/backward 10x     Vasopneumatic   Number Minutes Vasopneumatic  15 minutes   Vasopnuematic Location  Knee   Vasopneumatic Pressure Medium   Vasopneumatic Temperature  3 flakes     Manual Therapy   Manual therapy comments Soft tissue  work to Cytogeneticist with knee on stretch                  PT Short Term Goals - 01/28/17 0927      PT SHORT TERM GOAL #4   Title improve strength and endurance to stand and walk > or = to 25 minutes to improve community ambulation   Time 4   Period Weeks   Status Achieved           PT Long Term Goals - 01/21/17 5732      PT LONG TERM GOAL #1   Title be independent in advanced HEP   Time 8   Period Weeks   Status On-going     PT LONG TERM GOAL #3   Title demonstrate Lt knee A/ROM flexion to > or = to 115 degrees to allow for squatting and descending steps without substitution   Baseline 99 last visit   Time 8   Period Weeks   Status On-going     PT LONG TERM GOAL #4   Title improve Lt knee strength and flexibility to ascend and descend steps with step-over-step gait with use of 1 rail   Time 8   Period Weeks   Status On-going               Plan - 02/01/17 0931     Clinical Impression Statement Pt's knee was less swollen today vs last session and because of this was able to achieve 100 degrees of knee flexion with ease and was able to be pushed slightly more to 102. Pt will add some single leg balance via Tree Pose in yoga to her HEP.    Rehab Potential Good   PT Frequency 3x / week   PT Duration 8 weeks   PT Treatment/Interventions Cryotherapy;ADLs/Self Care Home Management;Functional mobility training;Stair training;Gait training;Ultrasound;Moist Heat;Therapeutic activities;Therapeutic exercise;Balance training;Neuromuscular re-education;Manual techniques;Vasopneumatic Device;Taping   PT Next Visit Plan flexion ROM, LE strength, edema management:    Consulted and Agree with Plan of Care Patient      Patient will benefit from skilled therapeutic intervention in order to improve the following deficits and impairments:  Abnormal gait, Decreased range of motion, Pain, Increased edema, Decreased strength, Decreased scar mobility, Impaired flexibility, Decreased activity tolerance, Decreased endurance  Visit Diagnosis: Acute pain of left knee  Localized edema  Other abnormalities of gait and mobility  Stiffness of left knee, not elsewhere classified  Chronic pain of left knee  Muscle weakness (generalized)     Problem List Patient Active Problem List   Diagnosis Date Noted  . S/P total knee replacement using cement, left 11/20/2016  . Osteopenia 02/28/2015  . Left lumbar radiculitis 02/28/2015  . Acute blood loss anemia 09/20/2014  . Primary osteoarthritis of right knee 09/14/2014  . Osteoarthritis of left knee 09/14/2014  . Vitamin B 12 deficiency 07/29/2014  . Obesity (BMI 30-39.9) 12/08/2013  . Osteoarthritis, knee 05/30/2012  . Metabolic syndrome 20/25/4270  . DVT, HX OF 02/10/2010  . Vitamin D deficiency 08/25/2007  . Disorder of bone and cartilage 04/08/2007  . Hypothyroidism 11/18/2006  . ANEMIA-NOS 11/18/2006  . Essential  hypertension 11/18/2006  . GERD 11/18/2006    Osker Ayoub, PTA 02/01/2017, 9:33 AM  [facility] [address] [place], [place], [address] Phone: [phone]   Fax:  [phone]  Name: Kimberly [name] MRN: [id number] Date of Birth: [birth year]

## 2017-02-04 ENCOUNTER — Ambulatory Visit: Payer: Medicare Other

## 2017-02-04 DIAGNOSIS — R2689 Other abnormalities of gait and mobility: Secondary | ICD-10-CM | POA: Diagnosis not present

## 2017-02-04 DIAGNOSIS — G8929 Other chronic pain: Secondary | ICD-10-CM | POA: Diagnosis not present

## 2017-02-04 DIAGNOSIS — M25562 Pain in left knee: Secondary | ICD-10-CM | POA: Diagnosis not present

## 2017-02-04 DIAGNOSIS — M6281 Muscle weakness (generalized): Secondary | ICD-10-CM | POA: Diagnosis not present

## 2017-02-04 DIAGNOSIS — M25662 Stiffness of left knee, not elsewhere classified: Secondary | ICD-10-CM | POA: Diagnosis not present

## 2017-02-04 DIAGNOSIS — R600 Localized edema: Secondary | ICD-10-CM

## 2017-02-04 NOTE — Therapy (Signed)
[facility] [address] [place], [place], [address] Phone: [phone]   Fax:  [phone]  Physical Therapy Treatment  Patient Details  Name: Kimberly [name] MRN: [id number] Date of Birth: 04-Apr-1951 Referring Provider: [name], MD  Encounter Date: 02/04/2017      Kimberly [name] End of Session - 02/04/17 0929    Visit Number 22   Number of Visits 30   Date for Kimberly [name] Re-Evaluation 02/11/17   Kimberly [name] Start Time 0834   Kimberly [name] Stop Time 0941   Kimberly [name] Time Calculation (min) 67 min   Activity Tolerance Patient tolerated treatment well   Behavior During Therapy [facility] for tasks assessed/performed      Past Medical History:  Diagnosis Date  . Anemia 2005   hb 11.5  . Arthritis   . DVT (deep venous thrombosis) ([place])    2003, coumadin x 6 mon  . GERD (gastroesophageal reflux disease) 2001   no problem now  . Hypertension   . Hypothyroidism    off meds  . Neuromuscular disorder ([place])    painful in L foot, being started on Gabapentin- 11/08/2016  . Osteopenia   . Vegetarian diet    eats fish and eggs; no meat    Past Surgical History:  Procedure Laterality Date  . JOINT REPLACEMENT    . KNEE ARTHROSCOPY Right 03/16/2003  . OOPHORECTOMY     "think it was my left"  . TOTAL KNEE ARTHROPLASTY Right 09/14/2014   Procedure: RIGHT TOTAL KNEE ARTHROPLASTY;  Surgeon: Garald Balding, MD;  Location: [place];  Service: Orthopedics;  Laterality: Right;  . TOTAL KNEE ARTHROPLASTY Left 11/20/2016  . TOTAL KNEE ARTHROPLASTY Left 11/20/2016   Procedure: LEFT TOTAL KNEE ARTHROPLASTY;  Surgeon: Garald Balding, MD;  Location: [place];  Service: Orthopedics;  Laterality: Left;    There were no vitals filed for this visit.      Subjective Assessment - 02/04/17 0856    Subjective I am doing well.  No pain when I woke up.     Currently in Pain? Yes   Pain Score 1    Pain Location Knee   Pain Orientation Left   Pain Descriptors / Indicators Dull    Pain Type Surgical pain   Pain Onset 1 to 4 weeks ago   Pain Frequency Intermittent   Aggravating Factors  bending the knee   Pain Relieving Factors ice, Game Ready                         [facility] Adult Kimberly [name] Treatment/Exercise - 02/04/17 0001      Knee/Hip Exercises: Aerobic   Stationary Bike L1 x 10 min   Nustep L4 x 10 min     Knee/Hip Exercises: Machines for Strengthening   Cybex Knee Flexion Bil 25# 2x10   Total Gym Leg Press seat 6, 75# 3x10 bil, Lt only 35# 2x10     Knee/Hip Exercises: Standing   Lateral Step Up Left;2 sets;10 reps;Hand Hold: 1;Step Height: 6"   Walking with Sports Cord 25# forward/backward 10x     Vasopneumatic   Number Minutes Vasopneumatic  15 minutes   Vasopnuematic Location  Knee   Vasopneumatic Pressure Medium   Vasopneumatic Temperature  3 flakes     Manual Therapy   Manual therapy comments Soft tissue work to quad and Chief Financial Officer tissue mobilization scar mobilization with knee on stretch  Kimberly [name] Short Term Goals - 02/04/17 0859      Kimberly [name] SHORT TERM GOAL #3   Title report < or = to 3/10 Lt knee pain with standing and walking   Status Achieved     Kimberly [name] SHORT TERM GOAL #4   Title improve strength and endurance to stand and walk > or = to 25 minutes to improve community ambulation   Baseline 15 minutes   Time 4   Period Weeks   Status On-going           Kimberly [name] Long Term Goals - 02/04/17 0900      Kimberly [name] LONG TERM GOAL #3   Title demonstrate Lt knee A/ROM flexion to > or = to 115 degrees to allow for squatting and descending steps without substitution   Baseline 100 last visit   Time 8   Period Weeks   Status On-going     Kimberly [name] LONG TERM GOAL #4   Title improve Lt knee strength and flexibility to ascend and descend steps with step-over-step gait with use of 1 rail   Baseline alternating with ascending, step-to with descending due to pain   Time 8   Period Weeks   Status On-going                Plan - 02/04/17 0902    Clinical Impression Statement Kimberly [name] is limited to walking 15 minutes due to fatigue and pain.  Kimberly [name] is able to ascend steps with step over step gain and is limited to step to pattern due to pain with this.  Kimberly [name] is limited to ~100 degrees flexion.  Kimberly [name] will continue to benefit from skilled Kimberly [name] for LE strength, edema management and flexibility.     Kimberly [name] Frequency 3x / week   Kimberly [name] Duration 8 weeks   Kimberly [name] Treatment/Interventions Cryotherapy;ADLs/Self Care Home Management;Functional mobility training;Stair training;Gait training;Ultrasound;Moist Heat;Therapeutic activities;Therapeutic exercise;Balance training;Neuromuscular re-education;Manual techniques;Vasopneumatic Device;Taping   Kimberly [name] Next Visit Plan flexion ROM, LE strength, edema management:    Consulted and Agree with Plan of Care Patient      Patient will benefit from skilled therapeutic intervention in order to improve the following deficits and impairments:  Abnormal gait, Decreased range of motion, Pain, Increased edema, Decreased strength, Decreased scar mobility, Impaired flexibility, Decreased activity tolerance, Decreased endurance  Visit Diagnosis: Acute pain of left knee  Localized edema  Other abnormalities of gait and mobility  Stiffness of left knee, not elsewhere classified     Problem List Patient Active Problem List   Diagnosis Date Noted  . S/P total knee replacement using cement, left 11/20/2016  . Osteopenia 02/28/2015  . Left lumbar radiculitis 02/28/2015  . Acute blood loss anemia 09/20/2014  . Primary osteoarthritis of right knee 09/14/2014  . Osteoarthritis of left knee 09/14/2014  . Vitamin B 12 deficiency 07/29/2014  . Obesity (BMI 30-39.9) 12/08/2013  . Osteoarthritis, knee 05/30/2012  . Metabolic syndrome 16/02/9603  . DVT, HX OF 02/10/2010  . Vitamin D deficiency 08/25/2007  . Disorder of bone and cartilage 04/08/2007  . Hypothyroidism 11/18/2006  . ANEMIA-NOS 11/18/2006  .  Essential hypertension 11/18/2006  . GERD 11/18/2006    Kimberly [name], Kimberly [name] 02/04/17 9:31 AM  [facility] [address] [place], [place], [address] Phone: [phone]   Fax:  [phone]  Name: Kimberly [name] MRN: [id number] Date of Birth: Sep 28, 1950

## 2017-02-06 ENCOUNTER — Encounter: Payer: Self-pay | Admitting: Physical Therapy

## 2017-02-06 ENCOUNTER — Encounter: Payer: Self-pay | Admitting: Family Medicine

## 2017-02-06 ENCOUNTER — Ambulatory Visit: Payer: Medicare Other | Admitting: Physical Therapy

## 2017-02-06 ENCOUNTER — Ambulatory Visit (INDEPENDENT_AMBULATORY_CARE_PROVIDER_SITE_OTHER): Payer: Medicare Other | Admitting: Family Medicine

## 2017-02-06 VITALS — BP 110/70 | HR 82 | Temp 98.1°F | Wt 164.4 lb

## 2017-02-06 DIAGNOSIS — H9201 Otalgia, right ear: Secondary | ICD-10-CM | POA: Diagnosis not present

## 2017-02-06 DIAGNOSIS — M79622 Pain in left upper arm: Secondary | ICD-10-CM

## 2017-02-06 DIAGNOSIS — R2689 Other abnormalities of gait and mobility: Secondary | ICD-10-CM | POA: Diagnosis not present

## 2017-02-06 DIAGNOSIS — M6281 Muscle weakness (generalized): Secondary | ICD-10-CM

## 2017-02-06 DIAGNOSIS — G8929 Other chronic pain: Secondary | ICD-10-CM | POA: Diagnosis not present

## 2017-02-06 DIAGNOSIS — M25662 Stiffness of left knee, not elsewhere classified: Secondary | ICD-10-CM | POA: Diagnosis not present

## 2017-02-06 DIAGNOSIS — M25562 Pain in left knee: Secondary | ICD-10-CM

## 2017-02-06 DIAGNOSIS — R600 Localized edema: Secondary | ICD-10-CM

## 2017-02-06 NOTE — Patient Instructions (Signed)
Barotitis Media Barotitis media is inflammation of the middle ear. This condition occurs when an auditory tube (eustachian tube) is blocked in one or both ears. These tubes lead from the middle ear to the back of the nose (nasopharynx). This condition typically occurs when you experience changes in pressure, such as when flying or scuba diving. Untreated barotitis media may lead to damage or hearing loss (barotrauma), which may become permanent. What are the causes? This condition may be caused by changes in air pressure from:  Flying.  Scuba diving.  A nearby explosion. What increases the risk? The following factors may make you more likely to develop this condition:  Middle ear infection.  Sinus infection.  A cold.  Environmental allergies.  Small eustachian tubes.  Recent ear surgery. What are the signs or symptoms? Symptoms of this condition may include:  Ear pain.  Hearing loss. In severe cases, symptoms can include:  Dizziness and nausea (vertigo).  Temporary facial paralysis. How is this diagnosed? This condition is diagnosed based on:  A physical exam. Your health care provider may:  Use a device (otoscope) to look into your ear canal and check your eardrum.  Do a test that changes air pressure in the middle ear to check how well the eardrum moves and to see if the eustachian tube is working(tympanogram).  Your medical history. In some cases, your health care provider may have you take a hearing test. You may also be referred to someone who specializes in ear treatment (otolaryngologist, "ENT"). How is this treated? This condition may be treated with:  Medicines to relieve congestion in your nose, sinus, or upper respiratory tract (decongestants).  Techniques to equalize pressure (to "pop" your ears), such as:  Yawning.  Chewing gum.  Swallowing. In severe cases, you may need surgery to relieve your symptoms or to prevent future inflammation. Follow  these instructions at home:  Take over-the-counter and prescription medicines only as told by your health care provider.  Do not put anything into your ears to clean or unplug them. Ear drops will not help.  Keep all follow-up visits as told by your health care provider. This is important. How is this prevented? Using these strategies may help to prevent barotitis media:  Chewing gum with frequent, forceful swallowing during takeoff and landing when flying.  Holding your nose and gently blowing to pop your ears for equalizing pressure changes. This forces air into the eustachian tube.  Yawning during air pressure changes.  Using a nasal decongestant about 30-60 minutes before flying, if you have nasal congestion. Contact a health care provider if:  You have vertigo.  You have hearing loss.  Your symptoms do not get better or they get worse.  You have a fever. Get help right away if:  You have a severe headache, ear pain, and dizziness.  You have balance problems.  You cannot move or feel part of your face.  You have bloody or pus-like drainage from your ears. Summary  Barotitis media is inflammation of the middle ear.  This condition typically occurs when you experience changes in pressure, such as when flying or scuba diving.  You may be at a higher risk for this condition if you have small eustachian tubes, had recent ear surgery, or have allergies, a cold, or sinus or middle ear infection.  This condition may be treated with medicines or techniques to equalize pressure in your ears.  Strategies can be used to help prevent barotitis media. This information is   not intended to replace advice given to you by your health care provider. Make sure you discuss any questions you have with your health care provider. Document Released: 04/27/2000 Document Revised: 03/19/2016 Document Reviewed: 03/19/2016 Elsevier Interactive Patient Education  2017 [employer]  

## 2017-02-06 NOTE — Progress Notes (Signed)
Subjective:     Patient ID: Kimberly [name], female   DOB: 1951-04-20, 66 y.o.   MRN: [id number]  HPI Patient here for the following acute/new issues:  Right ear fullness for the past 2-3 weeks. No nasal congestion. No ear drainage. No hearing changes.  No vertigo.  No recent flying.  Second issue is left axillary pain. She states this feels somewhat like shingles did previously which he had on other side. Pain is been present for months. No cervical neck pain. No chest pain. No rash. Denies upper extremity numbness or weakness. She gets yearly mammograms. Has not noted any breast masses or adenopathy. No exacerbating or alleviating features.  Past Medical History:  Diagnosis Date  . Anemia 2005   hb 11.5  . Arthritis   . DVT (deep venous thrombosis) ([place])    2003, coumadin x 6 mon  . GERD (gastroesophageal reflux disease) 2001   no problem now  . Hypertension   . Hypothyroidism    off meds  . Neuromuscular disorder ([place])    painful in L foot, being started on Gabapentin- 11/08/2016  . Osteopenia   . Vegetarian diet    eats fish and eggs; no meat   Past Surgical History:  Procedure Laterality Date  . JOINT REPLACEMENT    . KNEE ARTHROSCOPY Right 03/16/2003  . OOPHORECTOMY     "think it was my left"  . TOTAL KNEE ARTHROPLASTY Right 09/14/2014   Procedure: RIGHT TOTAL KNEE ARTHROPLASTY;  Surgeon: Garald Balding, MD;  Location: [place];  Service: Orthopedics;  Laterality: Right;  . TOTAL KNEE ARTHROPLASTY Left 11/20/2016  . TOTAL KNEE ARTHROPLASTY Left 11/20/2016   Procedure: LEFT TOTAL KNEE ARTHROPLASTY;  Surgeon: Garald Balding, MD;  Location: [place];  Service: Orthopedics;  Laterality: Left;    reports that she has never smoked. She has never used smokeless tobacco. She reports that she does not drink alcohol or use drugs. family history includes Alcohol abuse in her other; Diabetes in her mother and other; Heart failure in her father. Allergies  Allergen Reactions  .  Enoxaparin Sodium Itching and Swelling    SWELLING REACTION UNSPECIFIED      Review of Systems  Constitutional: Negative for chills and fever.  HENT: Positive for ear pain. Negative for congestion, ear discharge, hearing loss, sinus pressure and sore throat.   Respiratory: Negative for cough and shortness of breath.   Cardiovascular: Negative for chest pain.  Skin: Negative for rash.  Hematological: Negative for adenopathy.       Objective:   Physical Exam  Constitutional: She appears well-developed and well-nourished.  HENT:  Right Ear: External ear normal.  Left Ear: External ear normal.  Mouth/Throat: Oropharynx is clear and moist.  Neck: Neck supple.  Cardiovascular: Normal rate and regular rhythm.   Pulmonary/Chest: Effort normal and breath sounds normal. No respiratory distress. She has no wheezes. She has no rales.  Left breast reveals no masses. No nipple inversion. No skin dimpling. Left axillary exam no adenopathy. No erythema. No abscess.  Lymphadenopathy:    She has no cervical adenopathy.  Skin: No rash noted.       Assessment:     #1 right otalgia. Normal exam. No cerumen. ?barotitis media  #2 left axillary pain of uncertain etiology. No evidence for any breast mass or axillary adenopathy.  ?brachial plexus pain.    Plan:     -Reassurance regarding ear symptoms. Follow-up promptly for any hearing changes, unilateral tinnitus, or other concerns -get  back on daily Allegra for any congestion symptoms. -Patient will set up repeat mammogram which she is due for soon anyway  Eulas Post MD [facility]

## 2017-02-06 NOTE — Therapy (Signed)
[facility] [address] [place], [place], [address] Phone: [phone]   Fax:  [phone]  Physical Therapy Treatment  Patient Details  Name: Kimberly [name] MRN: [id number] Date of Birth: [birth year] Referring Provider: [name], MD  Encounter Date: 02/06/2017      PT End of Session - 02/06/17 0901    Visit Number 23   Number of Visits 30   Date for PT Re-Evaluation 02/11/17   PT Start Time 0835   PT Stop Time 0945   PT Time Calculation (min) 70 min   Activity Tolerance Patient tolerated treatment well   Behavior During Therapy [facility] for tasks assessed/performed      Past Medical History:  Diagnosis Date  . Anemia 2005   hb 11.5  . Arthritis   . DVT (deep venous thrombosis) ([place])    2003, coumadin x 6 mon  . GERD (gastroesophageal reflux disease) 2001   no problem now  . Hypertension   . Hypothyroidism    off meds  . Neuromuscular disorder ([place])    painful in L foot, being started on Gabapentin- 11/08/2016  . Osteopenia   . Vegetarian diet    eats fish and eggs; no meat    Past Surgical History:  Procedure Laterality Date  . JOINT REPLACEMENT    . KNEE ARTHROSCOPY Right 03/16/2003  . OOPHORECTOMY     "think it was my left"  . TOTAL KNEE ARTHROPLASTY Right 09/14/2014   Procedure: RIGHT TOTAL KNEE ARTHROPLASTY;  Surgeon: Garald Balding, MD;  Location: [place];  Service: Orthopedics;  Laterality: Right;  . TOTAL KNEE ARTHROPLASTY Left 11/20/2016  . TOTAL KNEE ARTHROPLASTY Left 11/20/2016   Procedure: LEFT TOTAL KNEE ARTHROPLASTY;  Surgeon: Garald Balding, MD;  Location: [place];  Service: Orthopedics;  Laterality: Left;    There were no vitals filed for this visit.      Subjective Assessment - 02/06/17 0902    Subjective Pt has done 2 aquatic exercise classes this week and plans to continue this long term.    Currently in Pain? Yes   Pain Score 1    Pain Orientation Left   Pain  Descriptors / Indicators Dull   Multiple Pain Sites No                         [facility] Adult PT Treatment/Exercise - 02/06/17 0001      Knee/Hip Exercises: Aerobic   Stationary Bike L1 x 10 min   Nustep L4 x 10 min     Knee/Hip Exercises: Machines for Strengthening   Cybex Knee Flexion Bil 25# 2x10   Total Gym Leg Press seat 6, 75# 3x10 bil, Lt only 35# 2x10     Knee/Hip Exercises: Standing   Lateral Step Up Left;2 sets;10 reps;Hand Hold: 1;Step Height: 6"   Walking with Sports Cord 25# forward/backward 10x     Vasopneumatic   Number Minutes Vasopneumatic  15 minutes   Vasopnuematic Location  Knee   Vasopneumatic Pressure Medium   Vasopneumatic Temperature  3 flakes     Manual Therapy   Manual therapy comments Soft tissue work to quad and Chief Financial Officer tissue mobilization scar mobilization with knee on stretch                  PT Short Term Goals - 02/04/17 0859      PT SHORT TERM GOAL #3   Title report < or =  to 3/10 Lt knee pain with standing and walking   Status Achieved     PT SHORT TERM GOAL #4   Title improve strength and endurance to stand and walk > or = to 25 minutes to improve community ambulation   Baseline 15 minutes   Time 4   Period Weeks   Status On-going           PT Long Term Goals - 02/04/17 0900      PT LONG TERM GOAL #3   Title demonstrate Lt knee A/ROM flexion to > or = to 115 degrees to allow for squatting and descending steps without substitution   Baseline 100 last visit   Time 8   Period Weeks   Status On-going     PT LONG TERM GOAL #4   Title improve Lt knee strength and flexibility to ascend and descend steps with step-over-step gait with use of 1 rail   Baseline alternating with ascending, step-to with descending due to pain   Time 8   Period Weeks   Status On-going               Plan - 02/06/17 0930    Clinical Impression Statement Pt had no pain all day Tuesday which was the first time  she had no pain in a 24 hour period. She is now doing water exercise classes to supplement her PT sessions. She wants to be able to walk longer in preparation for trip to [place].    Rehab Potential Good   PT Frequency 3x / week   PT Duration 8 weeks   PT Treatment/Interventions Cryotherapy;ADLs/Self Care Home Management;Functional mobility training;Stair training;Gait training;Ultrasound;Moist Heat;Therapeutic activities;Therapeutic exercise;Balance training;Neuromuscular re-education;Manual techniques;Vasopneumatic Device;Taping   PT Next Visit Plan flexion ROM, LE strength, edema management: Walk outside   Consulted and Agree with Plan of Care Patient      Patient will benefit from skilled therapeutic intervention in order to improve the following deficits and impairments:  Abnormal gait, Decreased range of motion, Pain, Increased edema, Decreased strength, Decreased scar mobility, Impaired flexibility, Decreased activity tolerance, Decreased endurance  Visit Diagnosis: Acute pain of left knee  Localized edema  Other abnormalities of gait and mobility  Stiffness of left knee, not elsewhere classified  Chronic pain of left knee  Muscle weakness (generalized)     Problem List Patient Active Problem List   Diagnosis Date Noted  . S/P total knee replacement using cement, left 11/20/2016  . Osteopenia 02/28/2015  . Left lumbar radiculitis 02/28/2015  . Acute blood loss anemia 09/20/2014  . Primary osteoarthritis of right knee 09/14/2014  . Osteoarthritis of left knee 09/14/2014  . Vitamin B 12 deficiency 07/29/2014  . Obesity (BMI 30-39.9) 12/08/2013  . Osteoarthritis, knee 05/30/2012  . Metabolic syndrome 19/62/2297  . DVT, HX OF 02/10/2010  . Vitamin D deficiency 08/25/2007  . Disorder of bone and cartilage 04/08/2007  . Hypothyroidism 11/18/2006  . ANEMIA-NOS 11/18/2006  . Essential hypertension 11/18/2006  . GERD 11/18/2006    COCHRAN,JENNIFER, PTA 02/06/2017, 9:32  AM  [facility] [address], [place], [place], [address] Phone: [phone]   Fax:  [phone]  Name: Kimberly [name] MRN: [id number] Date of Birth: 1950/07/27

## 2017-02-08 ENCOUNTER — Encounter: Payer: Self-pay | Admitting: Physical Therapy

## 2017-02-08 ENCOUNTER — Ambulatory Visit: Payer: Medicare Other | Admitting: Physical Therapy

## 2017-02-08 DIAGNOSIS — R2689 Other abnormalities of gait and mobility: Secondary | ICD-10-CM

## 2017-02-08 DIAGNOSIS — R600 Localized edema: Secondary | ICD-10-CM

## 2017-02-08 DIAGNOSIS — M25662 Stiffness of left knee, not elsewhere classified: Secondary | ICD-10-CM

## 2017-02-08 DIAGNOSIS — M25562 Pain in left knee: Secondary | ICD-10-CM

## 2017-02-08 DIAGNOSIS — M6281 Muscle weakness (generalized): Secondary | ICD-10-CM

## 2017-02-08 DIAGNOSIS — G8929 Other chronic pain: Secondary | ICD-10-CM | POA: Diagnosis not present

## 2017-02-08 NOTE — Therapy (Signed)
[facility] [address] [place], [place], [address] Phone: [phone]   Fax:  [phone]  Physical Therapy Treatment  Patient Details  Name: Kimberly [name] MRN: [id number] Date of Birth: [birth year] Referring Provider: [name], MD  Encounter Date: 02/08/2017      PT End of Session - 02/08/17 0852    Visit Number 24   Number of Visits 30   Date for PT Re-Evaluation 02/11/17   PT Start Time 0840   PT Stop Time 0945   PT Time Calculation (min) 65 min   Activity Tolerance Patient tolerated treatment well   Behavior During Therapy [facility] for tasks assessed/performed      Past Medical History:  Diagnosis Date  . Anemia 2005   hb 11.5  . Arthritis   . DVT (deep venous thrombosis) ([place])    2003, coumadin x 6 mon  . GERD (gastroesophageal reflux disease) 2001   no problem now  . Hypertension   . Hypothyroidism    off meds  . Neuromuscular disorder ([place])    painful in L foot, being started on Gabapentin- 11/08/2016  . Osteopenia   . Vegetarian diet    eats fish and eggs; no meat    Past Surgical History:  Procedure Laterality Date  . JOINT REPLACEMENT    . KNEE ARTHROSCOPY Right 03/16/2003  . OOPHORECTOMY     "think it was my left"  . TOTAL KNEE ARTHROPLASTY Right 09/14/2014   Procedure: RIGHT TOTAL KNEE ARTHROPLASTY;  Surgeon: Garald Balding, MD;  Location: [place];  Service: Orthopedics;  Laterality: Right;  . TOTAL KNEE ARTHROPLASTY Left 11/20/2016  . TOTAL KNEE ARTHROPLASTY Left 11/20/2016   Procedure: LEFT TOTAL KNEE ARTHROPLASTY;  Surgeon: Garald Balding, MD;  Location: [place];  Service: Orthopedics;  Laterality: Left;    There were no vitals filed for this visit.      Subjective Assessment - 02/08/17 0854    Subjective Had a lot of pain yesterday, not sure why,  took meds and is better today.   Currently in Pain? Yes   Pain Score 2    Pain Location Knee   Pain Orientation Left   Pain  Descriptors / Indicators Dull   Multiple Pain Sites No            [facility] PT Assessment - 02/08/17 0001      AROM   Left Knee Flexion 100                     [facility] Adult PT Treatment/Exercise - 02/08/17 0001      Knee/Hip Exercises: Aerobic   Stationary Bike L1 x 10 min   Nustep L4 x 10 min     Knee/Hip Exercises: Machines for Strengthening   Cybex Knee Flexion Bil 25# 2x10   Total Gym Leg Press seat 6, 75# 3x10 bil, Lt only 35# 2x10     Knee/Hip Exercises: Standing   Lateral Step Up --  With red band side stepping 2x 30-40 feet   Rebounder weight shifting 1 min 3 ways   Walking with Sports Cord 25# forward/backward 10x     Vasopneumatic   Number Minutes Vasopneumatic  15 minutes   Vasopnuematic Location  Knee   Vasopneumatic Pressure Medium   Vasopneumatic Temperature  3 flakes                  PT Short Term Goals - 02/04/17 6812  PT SHORT TERM GOAL #3   Title report < or = to 3/10 Lt knee pain with standing and walking   Status Achieved     PT SHORT TERM GOAL #4   Title improve strength and endurance to stand and walk > or = to 25 minutes to improve community ambulation   Baseline 15 minutes   Time 4   Period Weeks   Status On-going           PT Long Term Goals - 02/04/17 0900      PT LONG TERM GOAL #3   Title demonstrate Lt knee A/ROM flexion to > or = to 115 degrees to allow for squatting and descending steps without substitution   Baseline 100 last visit   Time 8   Period Weeks   Status On-going     PT LONG TERM GOAL #4   Title improve Lt knee strength and flexibility to ascend and descend steps with step-over-step gait with use of 1 rail   Baseline alternating with ascending, step-to with descending due to pain   Time 8   Period Weeks   Status On-going               Plan - 02/08/17 5009    Clinical Impression Statement Pt had alot of pain yesterday and chose to rest and take some medication. She presented  much better today with pain near a 2/10 level and able to complete her exercises without any increased pain in addition to adding resisted sidestepping with the red band.  We did not walk outside due to rain.  Pt 's knee flexion active was again 100 degrees.   PT Next Visit Plan flexion ROM, LE strength, edema management: Walk outside      Patient will benefit from skilled therapeutic intervention in order to improve the following deficits and impairments:     Visit Diagnosis: Acute pain of left knee  Localized edema  Other abnormalities of gait and mobility  Stiffness of left knee, not elsewhere classified  Muscle weakness (generalized)  Chronic pain of left knee     Problem List Patient Active Problem List   Diagnosis Date Noted  . S/P total knee replacement using cement, left 11/20/2016  . Osteopenia 02/28/2015  . Left lumbar radiculitis 02/28/2015  . Acute blood loss anemia 09/20/2014  . Primary osteoarthritis of right knee 09/14/2014  . Osteoarthritis of left knee 09/14/2014  . Vitamin B 12 deficiency 07/29/2014  . Obesity (BMI 30-39.9) 12/08/2013  . Osteoarthritis, knee 05/30/2012  . Metabolic syndrome 38/18/2993  . DVT, HX OF 02/10/2010  . Vitamin D deficiency 08/25/2007  . Disorder of bone and cartilage 04/08/2007  . Hypothyroidism 11/18/2006  . ANEMIA-NOS 11/18/2006  . Essential hypertension 11/18/2006  . GERD 11/18/2006    Emmalynne Courtney, PTA 02/08/2017, 9:32 AM  [facility] [address], [place], [place], [address] Phone: [phone]   Fax:  [phone]  Name: Ilyana Manuele MRN: [id number] Date of Birth: 26-Jun-1950

## 2017-02-10 IMAGING — CR DG CHEST 2V
2 series · 2 of 2 positions shown · non-contrast
Comparison: PA and lateral chest x-ray August 03, 2013

CLINICAL DATA: Preoperative exam prior to total knee replacement,
nonsmoker, history of hypertension.

EXAM:
CHEST  2 VIEW

[w chest pa]
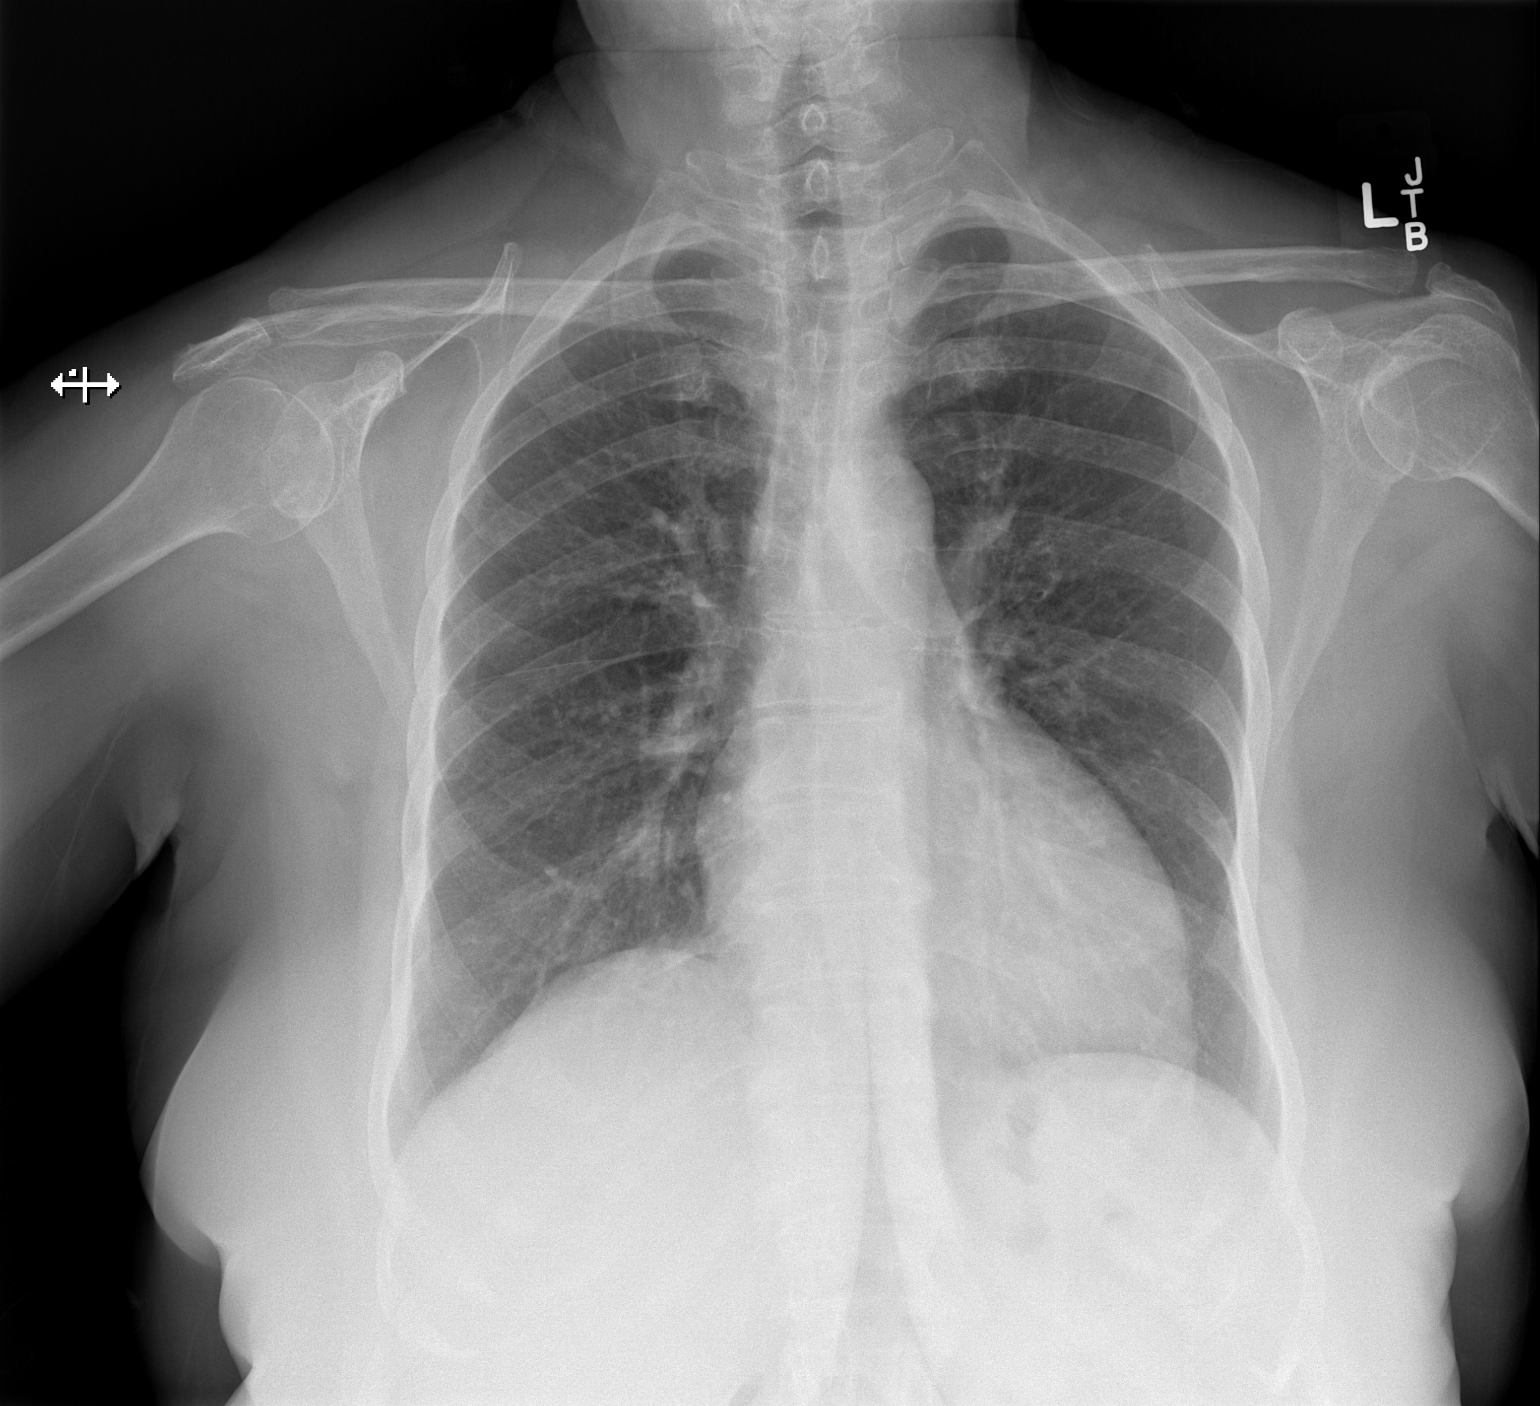

[w chest lat]
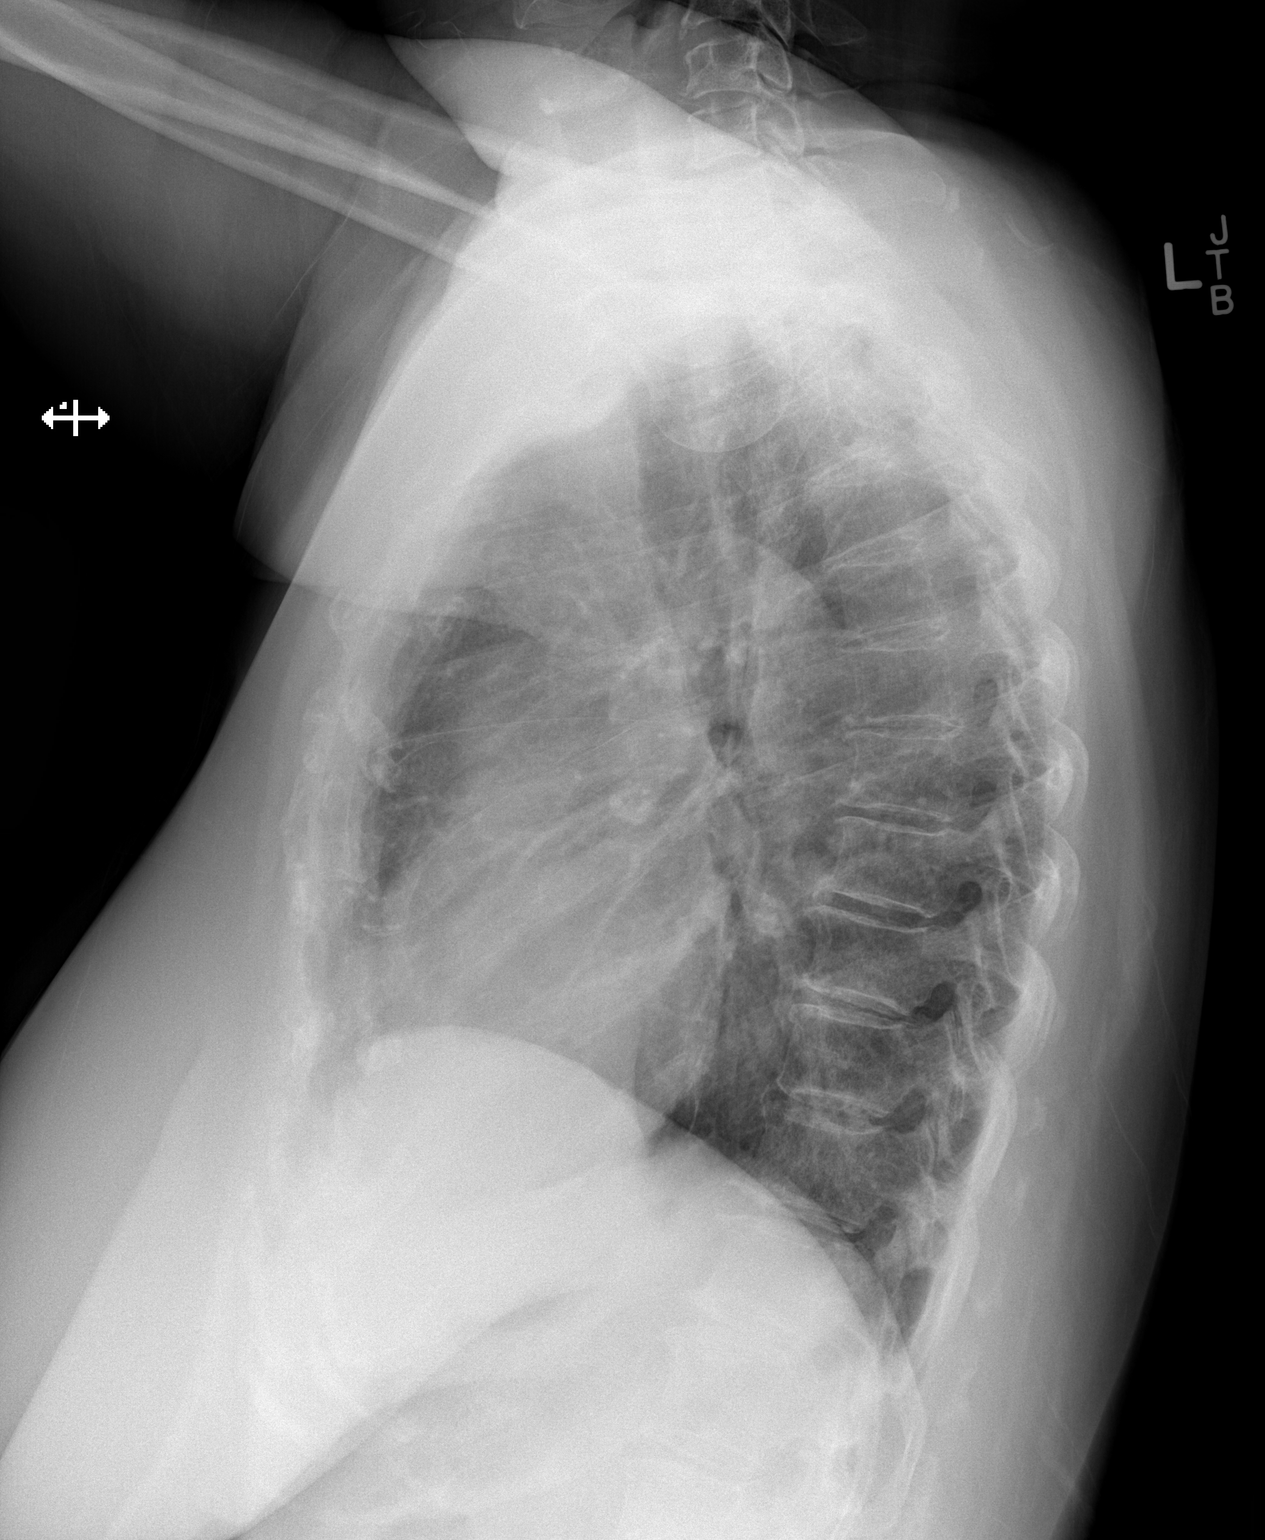

[2 of 2 positions shown; findings below may reference images not displayed]

FINDINGS: The lungs are adequately inflated. There is no focal infiltrate.
There is no pleural effusion. The heart and pulmonary vascularity
are normal. The mediastinum is normal in width. The bony thorax is
unremarkable.
IMPRESSION: There is no active cardiopulmonary disease.

## 2017-02-11 ENCOUNTER — Ambulatory Visit: Payer: Medicare Other | Attending: Orthopaedic Surgery

## 2017-02-11 DIAGNOSIS — M25662 Stiffness of left knee, not elsewhere classified: Secondary | ICD-10-CM | POA: Diagnosis not present

## 2017-02-11 DIAGNOSIS — M6281 Muscle weakness (generalized): Secondary | ICD-10-CM | POA: Insufficient documentation

## 2017-02-11 DIAGNOSIS — M25562 Pain in left knee: Secondary | ICD-10-CM | POA: Insufficient documentation

## 2017-02-11 DIAGNOSIS — R600 Localized edema: Secondary | ICD-10-CM

## 2017-02-11 DIAGNOSIS — G8929 Other chronic pain: Secondary | ICD-10-CM | POA: Insufficient documentation

## 2017-02-11 DIAGNOSIS — R2689 Other abnormalities of gait and mobility: Secondary | ICD-10-CM

## 2017-02-11 NOTE — Therapy (Signed)
[facility] [address], [place], [place], [address] Phone: [phone]   Fax:  [phone]  Physical Therapy Treatment  Patient Details  Name: Kimberly [name] MRN: [id number] Date of Birth: 08/10/1950 Referring Provider: [name], MD  Encounter Date: 02/11/2017      PT End of Session - 02/11/17 0924    Visit Number 25   Number of Visits 30   Date for PT Re-Evaluation 03/11/17   PT Start Time 0839   PT Stop Time 0945   PT Time Calculation (min) 66 min   Activity Tolerance Patient tolerated treatment well   Behavior During Therapy [facility] for tasks assessed/performed      Past Medical History:  Diagnosis Date  . Anemia 2005   hb 11.5  . Arthritis   . DVT (deep venous thrombosis) ([place])    2003, coumadin x 6 mon  . GERD (gastroesophageal reflux disease) 2001   no problem now  . Hypertension   . Hypothyroidism    off meds  . Neuromuscular disorder ([place])    painful in L foot, being started on Gabapentin- 11/08/2016  . Osteopenia   . Vegetarian diet    eats fish and eggs; no meat    Past Surgical History:  Procedure Laterality Date  . JOINT REPLACEMENT    . KNEE ARTHROSCOPY Right 03/16/2003  . OOPHORECTOMY     "think it was my left"  . TOTAL KNEE ARTHROPLASTY Right 09/14/2014   Procedure: RIGHT TOTAL KNEE ARTHROPLASTY;  Surgeon: Garald Balding, MD;  Location: [place];  Service: Orthopedics;  Laterality: Right;  . TOTAL KNEE ARTHROPLASTY Left 11/20/2016  . TOTAL KNEE ARTHROPLASTY Left 11/20/2016   Procedure: LEFT TOTAL KNEE ARTHROPLASTY;  Surgeon: Garald Balding, MD;  Location: [place];  Service: Orthopedics;  Laterality: Left;    There were no vitals filed for this visit.      Subjective Assessment - 02/11/17 0851    Subjective I am doing well.  Still with limited Lt knee A/ROM.     Currently in Pain? Yes   Pain Score 1    Pain Location Knee   Pain Orientation Left   Pain Descriptors /  Indicators Dull   Pain Type Surgical pain   Pain Onset More than a month ago   Pain Frequency Intermittent   Aggravating Factors  descending steps, bending the knee   Pain Relieving Factors ice, Game Ready            [facility] PT Assessment - 02/11/17 0001      Assessment   Medical Diagnosis Lt TKA   Next MD Visit 03/15/17     Precautions   Precautions None     Balance Screen   Has the patient fallen in the past 6 months No   Has the patient had a decrease in activity level because of a fear of falling?  No   Is the patient reluctant to leave their home because of a fear of falling?  No     Home Ecologist residence     Prior Function   Level of Independence Independent     Cognition   Overall Cognitive Status Within Functional Limits for tasks assessed     Observation/Other Assessments   Focus on Therapeutic Outcomes (FOTO)  40% limitation     ROM / Strength   AROM / PROM / Strength AROM;PROM;Strength     AROM   Left Knee Extension 0  Left Knee Flexion 100     PROM   Left Knee Flexion 103     Strength   Left Knee Flexion 4+/5   Left Knee Extension 4+/5     Ambulation/Gait   Ambulation/Gait Yes   Gait Pattern Step-through pattern;Decreased stride length;Decreased hip/knee flexion - left   Stairs Yes   Stairs Assistance 7: Independent   Stair Management Technique Step to pattern                     [facility] Adult PT Treatment/Exercise - 02/11/17 0001      Knee/Hip Exercises: Aerobic   Stationary Bike L1 x 10 min   Nustep L4 x 10 min     Knee/Hip Exercises: Machines for Strengthening   Cybex Knee Flexion Bil 25# 2x10   Total Gym Leg Press seat 6, 75# 3x10 bil, Lt only 35# 2x10     Knee/Hip Exercises: Standing   Rebounder weight shifting 1 min 3 ways   Walking with Sports Cord 25# forward/backward 10x     Vasopneumatic   Number Minutes Vasopneumatic  15 minutes   Vasopnuematic Location  Knee   Vasopneumatic  Pressure Medium   Vasopneumatic Temperature  3 flakes                  PT Short Term Goals - 02/04/17 0859      PT SHORT TERM GOAL #3   Title report < or = to 3/10 Lt knee pain with standing and walking   Status Achieved     PT SHORT TERM GOAL #4   Title improve strength and endurance to stand and walk > or = to 25 minutes to improve community ambulation   Baseline 15 minutes   Time 4   Period Weeks   Status On-going           PT Long Term Goals - 02/11/17 2355      PT LONG TERM GOAL #1   Title be independent in advanced HEP   Time 4   Period Weeks   Status On-going     PT LONG TERM GOAL #2   Title reduce FOTO to < or = to 42% limitation   Baseline 40% limitation   Status Achieved     PT LONG TERM GOAL #3   Title demonstrate Lt knee A/ROM flexion to > or = to 115 degrees to allow for squatting and descending steps without substitution   Baseline 100 degrees   Time 4   Period Weeks   Status On-going     PT LONG TERM GOAL #4   Title improve Lt knee strength and flexibility to ascend and descend steps with step-over-step gait with use of 1 rail   Baseline alternating with ascending at times, step-to with descending due to pain   Time 4   Period Weeks   Status On-going     PT LONG TERM GOAL #5   Title demonstrate 5/5 Lt knee strength with good quad set to improve safety and endurance in the community   Baseline 4+/5 Lt quad and hamstring strength   Time 4   Period Weeks   Status On-going     PT LONG TERM GOAL #6   Title improve strength and endurance to ambulate for 30-45 minutes without limitation   Baseline 20 minutes due to fatigue   Time 4   Period Weeks   Status On-going  Plan - 02/11/17 0901    Clinical Impression Statement Pt is making steady progress s/p Lt TKA.  Pt with improved symmetry with gait.  Pt with limited Lt knee flexion A/ROM to 100 degrees.  She is able to ascend steps with step over step gait ~50% of  the time and is not able to descend with step-over-step due to limited A/ROM and pain with this activity.  Pt is limited to walking 20 minutes due to fatigue/weakness.  Pt with improved strength and reduced edema over all.  Pt Kimberly benefit from continued skilled PT to improve functional strength, mobility and A/ROM.     Rehab Potential Good   PT Frequency 3x / week   PT Duration 4 weeks   PT Treatment/Interventions Cryotherapy;ADLs/Self Care Home Management;Functional mobility training;Stair training;Gait training;Ultrasound;Moist Heat;Therapeutic activities;Therapeutic exercise;Balance training;Neuromuscular re-education;Manual techniques;Vasopneumatic Device;Taping   PT Next Visit Plan flexion ROM, LE strength, edema management, endurance    Recommended Other Services initial certification is signed   Consulted and Agree with Plan of Care Patient      Patient Kimberly benefit from skilled therapeutic intervention in order to improve the following deficits and impairments:  Abnormal gait, Decreased range of motion, Pain, Increased edema, Decreased strength, Decreased scar mobility, Impaired flexibility, Decreased activity tolerance, Decreased endurance  Visit Diagnosis: Acute pain of left knee - Plan: PT plan of care cert/re-cert  Localized edema - Plan: PT plan of care cert/re-cert  Other abnormalities of gait and mobility - Plan: PT plan of care cert/re-cert  Stiffness of left knee, not elsewhere classified - Plan: PT plan of care cert/re-cert  Muscle weakness (generalized) - Plan: PT plan of care cert/re-cert     Problem List Patient Active Problem List   Diagnosis Date Noted  . S/P total knee replacement using cement, left 11/20/2016  . Osteopenia 02/28/2015  . Left lumbar radiculitis 02/28/2015  . Acute blood loss anemia 09/20/2014  . Primary osteoarthritis of right knee 09/14/2014  . Osteoarthritis of left knee 09/14/2014  . Vitamin B 12 deficiency 07/29/2014  . Obesity (BMI  30-39.9) 12/08/2013  . Osteoarthritis, knee 05/30/2012  . Metabolic syndrome 25/63/8937  . DVT, HX OF 02/10/2010  . Vitamin D deficiency 08/25/2007  . Disorder of bone and cartilage 04/08/2007  . Hypothyroidism 11/18/2006  . ANEMIA-NOS 11/18/2006  . Essential hypertension 11/18/2006  . GERD 11/18/2006    Sigurd Sos, PT 02/11/17 9:27 AM  [facility] [address], [place], [place], [address] Phone: [phone]   Fax:  [phone]  Name: Ayra Hodgdon MRN: [id number] Date of Birth: 22-Apr-1951

## 2017-02-13 ENCOUNTER — Ambulatory Visit: Payer: Medicare Other

## 2017-02-13 DIAGNOSIS — M25662 Stiffness of left knee, not elsewhere classified: Secondary | ICD-10-CM | POA: Diagnosis not present

## 2017-02-13 DIAGNOSIS — M6281 Muscle weakness (generalized): Secondary | ICD-10-CM | POA: Diagnosis not present

## 2017-02-13 DIAGNOSIS — R600 Localized edema: Secondary | ICD-10-CM

## 2017-02-13 DIAGNOSIS — M25562 Pain in left knee: Secondary | ICD-10-CM

## 2017-02-13 DIAGNOSIS — R2689 Other abnormalities of gait and mobility: Secondary | ICD-10-CM | POA: Diagnosis not present

## 2017-02-13 DIAGNOSIS — G8929 Other chronic pain: Secondary | ICD-10-CM | POA: Diagnosis not present

## 2017-02-13 NOTE — Therapy (Signed)
[facility] [address] [place], [place], [address] Phone: [phone]   Fax:  [phone]  Physical Therapy Treatment  Patient Details  Name: Kimberly [name] MRN: [id number] Date of Birth: 1951/03/07 Referring Provider: [name], MD  Encounter Date: 02/13/2017      PT End of Session - 02/13/17 1133    Visit Number 26   Number of Visits 30   Date for PT Re-Evaluation 03/11/17   PT Start Time 1055   PT Stop Time 1150   PT Time Calculation (min) 55 min   Activity Tolerance Patient tolerated treatment well   Behavior During Therapy [facility] for tasks assessed/performed      Past Medical History:  Diagnosis Date  . Anemia 2005   hb 11.5  . Arthritis   . DVT (deep venous thrombosis) ([place])    2003, coumadin x 6 mon  . GERD (gastroesophageal reflux disease) 2001   no problem now  . Hypertension   . Hypothyroidism    off meds  . Neuromuscular disorder ([place])    painful in L foot, being started on Gabapentin- 11/08/2016  . Osteopenia   . Vegetarian diet    eats fish and eggs; no meat    Past Surgical History:  Procedure Laterality Date  . JOINT REPLACEMENT    . KNEE ARTHROSCOPY Right 03/16/2003  . OOPHORECTOMY     "think it was my left"  . TOTAL KNEE ARTHROPLASTY Right 09/14/2014   Procedure: RIGHT TOTAL KNEE ARTHROPLASTY;  Surgeon: Garald Balding, MD;  Location: [place];  Service: Orthopedics;  Laterality: Right;  . TOTAL KNEE ARTHROPLASTY Left 11/20/2016  . TOTAL KNEE ARTHROPLASTY Left 11/20/2016   Procedure: LEFT TOTAL KNEE ARTHROPLASTY;  Surgeon: Garald Balding, MD;  Location: [place];  Service: Orthopedics;  Laterality: Left;    There were no vitals filed for this visit.      Subjective Assessment - 02/13/17 1102    Subjective I have very little pain.   Currently in Pain? Yes   Pain Score 1    Pain Location Knee   Pain Orientation Left                         [facility] Adult PT  Treatment/Exercise - 02/13/17 0001      Knee/Hip Exercises: Stretches   Gastroc Stretch Both;3 reps;30 seconds  using slant board     Knee/Hip Exercises: Aerobic   Stationary Bike L1 x 10 min   Nustep L4 x 10 min     Knee/Hip Exercises: Machines for Strengthening   Cybex Knee Flexion Bil 25# 2x10   Total Gym Leg Press seat 6, 75# 3x10 bil, Lt only 35# 2x10     Knee/Hip Exercises: Standing   Rebounder weight shifting 1 min 3 ways   Walking with Sports Cord 25# forward/backward 10x     Knee/Hip Exercises: Seated   Long Arc Quad Strengthening;Left;3 sets;10 reps;Weights   Long Arc Quad Weight 3 lbs.     Vasopneumatic   Number Minutes Vasopneumatic  15 minutes   Vasopnuematic Location  Knee   Vasopneumatic Pressure Medium   Vasopneumatic Temperature  3 flakes                  PT Short Term Goals - 02/04/17 0859      PT SHORT TERM GOAL #3   Title report < or = to 3/10 Lt knee pain with standing and walking  Status Achieved     PT SHORT TERM GOAL #4   Title improve strength and endurance to stand and walk > or = to 25 minutes to improve community ambulation   Baseline 15 minutes   Time 4   Period Weeks   Status On-going           PT Long Term Goals - 02/11/17 3299      PT LONG TERM GOAL #1   Title be independent in advanced HEP   Time 4   Period Weeks   Status On-going     PT LONG TERM GOAL #2   Title reduce FOTO to < or = to 42% limitation   Baseline 40% limitation   Status Achieved     PT LONG TERM GOAL #3   Title demonstrate Lt knee A/ROM flexion to > or = to 115 degrees to allow for squatting and descending steps without substitution   Baseline 100 degrees   Time 4   Period Weeks   Status On-going     PT LONG TERM GOAL #4   Title improve Lt knee strength and flexibility to ascend and descend steps with step-over-step gait with use of 1 rail   Baseline alternating with ascending at times, step-to with descending due to pain   Time 4    Period Weeks   Status On-going     PT LONG TERM GOAL #5   Title demonstrate 5/5 Lt knee strength with good quad set to improve safety and endurance in the community   Baseline 4+/5 Lt quad and hamstring strength   Time 4   Period Weeks   Status On-going     PT LONG TERM GOAL #6   Title improve strength and endurance to ambulate for 30-45 minutes without limitation   Baseline 20 minutes due to fatigue   Time 4   Period Weeks   Status On-going               Plan - 02/13/17 1111    Clinical Impression Statement Pt is making steady progress s/p Lt TKA.  Pt with improved symmetry with gait.  Pt with limited Lt knee flexion A/ROM to 100 degrees.  Pt is able to ascend steps with step over step gait ~50% of the time and is not able to descend with step-over-step due to limited A/ROM and pain with this activity.  Pt is limited to walking 20 minutes due to fatigue/weakness.  Pt with improved strength and reduced edema overall.  Pt will benefit from skilled PT to improve functional strength, mobility and A/ROM.     Rehab Potential Good   PT Frequency 3x / week   PT Duration 4 weeks   PT Treatment/Interventions Cryotherapy;ADLs/Self Care Home Management;Functional mobility training;Stair training;Gait training;Ultrasound;Moist Heat;Therapeutic activities;Therapeutic exercise;Balance training;Neuromuscular re-education;Manual techniques;Vasopneumatic Device;Taping   PT Next Visit Plan flexion ROM, LE strength, edema management, endurance    Consulted and Agree with Plan of Care Patient      Patient will benefit from skilled therapeutic intervention in order to improve the following deficits and impairments:  Abnormal gait, Decreased range of motion, Pain, Increased edema, Decreased strength, Decreased scar mobility, Impaired flexibility, Decreased activity tolerance, Decreased endurance  Visit Diagnosis: Acute pain of left knee  Localized edema  Other abnormalities of gait and  mobility  Stiffness of left knee, not elsewhere classified  Muscle weakness (generalized)     Problem List Patient Active Problem List   Diagnosis Date Noted  . S/P total  knee replacement using cement, left 11/20/2016  . Osteopenia 02/28/2015  . Left lumbar radiculitis 02/28/2015  . Acute blood loss anemia 09/20/2014  . Primary osteoarthritis of right knee 09/14/2014  . Osteoarthritis of left knee 09/14/2014  . Vitamin B 12 deficiency 07/29/2014  . Obesity (BMI 30-39.9) 12/08/2013  . Osteoarthritis, knee 05/30/2012  . Metabolic syndrome 29/56/2130  . DVT, HX OF 02/10/2010  . Vitamin D deficiency 08/25/2007  . Disorder of bone and cartilage 04/08/2007  . Hypothyroidism 11/18/2006  . ANEMIA-NOS 11/18/2006  . Essential hypertension 11/18/2006  . GERD 11/18/2006     Sigurd Sos, PT 02/13/17 11:37 AM  [facility] [address], [place], [place], [address] Phone: [phone]   Fax:  [phone]  Name: Keera Altidor MRN: [id number] Date of Birth: Apr 01, 1951

## 2017-02-18 ENCOUNTER — Encounter: Payer: Self-pay | Admitting: Physical Therapy

## 2017-02-18 ENCOUNTER — Ambulatory Visit: Payer: Medicare Other | Admitting: Physical Therapy

## 2017-02-18 DIAGNOSIS — M6281 Muscle weakness (generalized): Secondary | ICD-10-CM

## 2017-02-18 DIAGNOSIS — R2689 Other abnormalities of gait and mobility: Secondary | ICD-10-CM

## 2017-02-18 DIAGNOSIS — M25562 Pain in left knee: Secondary | ICD-10-CM

## 2017-02-18 DIAGNOSIS — M25662 Stiffness of left knee, not elsewhere classified: Secondary | ICD-10-CM | POA: Diagnosis not present

## 2017-02-18 DIAGNOSIS — R600 Localized edema: Secondary | ICD-10-CM

## 2017-02-18 DIAGNOSIS — G8929 Other chronic pain: Secondary | ICD-10-CM | POA: Diagnosis not present

## 2017-02-18 NOTE — Therapy (Signed)
[facility] [address], [place], [place], [address] Phone: [phone]   Fax:  [phone]  Physical Therapy Treatment  Patient Details  Name: Kimberly [name] MRN: [id number] Date of Birth: Feb 05, 1951 Referring Provider: [name], MD  Encounter Date: 02/18/2017      PT End of Session - 02/18/17 0845    Visit Number 27   Number of Visits 30   Date for PT Re-Evaluation 03/11/17   PT Start Time 0843   PT Stop Time 0950   PT Time Calculation (min) 67 min   Activity Tolerance Patient tolerated treatment well   Behavior During Therapy [facility] for tasks assessed/performed      Past Medical History:  Diagnosis Date  . Anemia 2005   hb 11.5  . Arthritis   . DVT (deep venous thrombosis) ([place])    2003, coumadin x 6 mon  . GERD (gastroesophageal reflux disease) 2001   no problem now  . Hypertension   . Hypothyroidism    off meds  . Neuromuscular disorder ([place])    painful in L foot, being started on Gabapentin- 11/08/2016  . Osteopenia   . Vegetarian diet    eats fish and eggs; no meat    Past Surgical History:  Procedure Laterality Date  . JOINT REPLACEMENT    . KNEE ARTHROSCOPY Right 03/16/2003  . OOPHORECTOMY     "think it was my left"  . TOTAL KNEE ARTHROPLASTY Right 09/14/2014   Procedure: RIGHT TOTAL KNEE ARTHROPLASTY;  Surgeon: Garald Balding, MD;  Location: [facility];  Service: Orthopedics;  Laterality: Right;  . TOTAL KNEE ARTHROPLASTY Left 11/20/2016  . TOTAL KNEE ARTHROPLASTY Left 11/20/2016   Procedure: LEFT TOTAL KNEE ARTHROPLASTY;  Surgeon: Garald Balding, MD;  Location: [facility] [place];  Service: Orthopedics;  Laterality: Left;    There were no vitals filed for this visit.      Subjective Assessment - 02/18/17 0848    Subjective Just tired this weekend from my activities. My knee did well with all my entertaining.   Currently in Pain? No/denies   Multiple Pain Sites No            [facility] PT  Assessment - 02/18/17 0001      AROM   Left Knee Flexion 103     PROM   Left Knee Flexion 104                     [facility] Adult PT Treatment/Exercise - 02/18/17 0001      Knee/Hip Exercises: Aerobic   Stationary Bike L1 x 10 min   Nustep L4 x 10 min     Knee/Hip Exercises: Machines for Strengthening   Cybex Knee Flexion Bil 25# 2x15  VC to not extend her back   Total Gym Leg Press seat 6, 75# 3x10 bil, Lt only 35# 2x10     Knee/Hip Exercises: Standing   Rebounder weight shifting 1 min 3 ways   Walking with Sports Cord 25# forward/backward 10x     Knee/Hip Exercises: Seated   Long Arc Quad Strengthening;Left;3 sets;10 reps;Weights   Long Arc Quad Weight 3 lbs.     Vasopneumatic   Number Minutes Vasopneumatic  15 minutes   Vasopnuematic Location  Knee   Vasopneumatic Pressure Medium   Vasopneumatic Temperature  3 flakes                  PT Short Term Goals - 02/04/17 7510  PT SHORT TERM GOAL #3   Title report < or = to 3/10 Lt knee pain with standing and walking   Status Achieved     PT SHORT TERM GOAL #4   Title improve strength and endurance to stand and walk > or = to 25 minutes to improve community ambulation   Baseline 15 minutes   Time 4   Period Weeks   Status On-going           PT Long Term Goals - 02/11/17 3354      PT LONG TERM GOAL #1   Title be independent in advanced HEP   Time 4   Period Weeks   Status On-going     PT LONG TERM GOAL #2   Title reduce FOTO to < or = to 42% limitation   Baseline 40% limitation   Status Achieved     PT LONG TERM GOAL #3   Title demonstrate Lt knee A/ROM flexion to > or = to 115 degrees to allow for squatting and descending steps without substitution   Baseline 100 degrees   Time 4   Period Weeks   Status On-going     PT LONG TERM GOAL #4   Title improve Lt knee strength and flexibility to ascend and descend steps with step-over-step gait with use of 1 rail   Baseline  alternating with ascending at times, step-to with descending due to pain   Time 4   Period Weeks   Status On-going     PT LONG TERM GOAL #5   Title demonstrate 5/5 Lt knee strength with good quad set to improve safety and endurance in the community   Baseline 4+/5 Lt quad and hamstring strength   Time 4   Period Weeks   Status On-going     PT LONG TERM GOAL #6   Title improve strength and endurance to ambulate for 30-45 minutes without limitation   Baseline 20 minutes due to fatigue   Time 4   Period Weeks   Status On-going               Plan - 02/18/17 0908    Clinical Impression Statement Pt was able to stand for long periods as she hosted an event at her house this weekend. Her knee did well but her ankle did swell. AROM slightly better with 104 degrees of PROM today., easlity attained.    Rehab Potential Good   PT Frequency 3x / week   PT Duration 4 weeks   PT Treatment/Interventions Cryotherapy;ADLs/Self Care Home Management;Functional mobility training;Stair training;Gait training;Ultrasound;Moist Heat;Therapeutic activities;Therapeutic exercise;Balance training;Neuromuscular re-education;Manual techniques;Vasopneumatic Device;Taping   PT Next Visit Plan flexion ROM, LE strength, edema management, endurance    Consulted and Agree with Plan of Care Patient      Patient will benefit from skilled therapeutic intervention in order to improve the following deficits and impairments:  Abnormal gait, Decreased range of motion, Pain, Increased edema, Decreased strength, Decreased scar mobility, Impaired flexibility, Decreased activity tolerance, Decreased endurance  Visit Diagnosis: Acute pain of left knee  Localized edema  Other abnormalities of gait and mobility  Stiffness of left knee, not elsewhere classified  Muscle weakness (generalized)     Problem List Patient Active Problem List   Diagnosis Date Noted  . S/P total knee replacement using cement, left  11/20/2016  . Osteopenia 02/28/2015  . Left lumbar radiculitis 02/28/2015  . Acute blood loss anemia 09/20/2014  . Primary osteoarthritis of right knee 09/14/2014  .  Osteoarthritis of left knee 09/14/2014  . Vitamin B 12 deficiency 07/29/2014  . Obesity (BMI 30-39.9) 12/08/2013  . Osteoarthritis, knee 05/30/2012  . Metabolic syndrome 10/05/9100  . DVT, HX OF 02/10/2010  . Vitamin D deficiency 08/25/2007  . Disorder of bone and cartilage 04/08/2007  . Hypothyroidism 11/18/2006  . ANEMIA-NOS 11/18/2006  . Essential hypertension 11/18/2006  . GERD 11/18/2006    Ed Rayson, PTA 02/18/2017, 9:41 AM  [facility] [address] [place], [place], [address] Phone: [phone]   Fax:  [phone]  Name: Keela Rubert MRN: [id number] Date of Birth: Sep 11, 1950

## 2017-02-20 ENCOUNTER — Ambulatory Visit: Payer: Medicare Other

## 2017-02-20 DIAGNOSIS — G8929 Other chronic pain: Secondary | ICD-10-CM | POA: Diagnosis not present

## 2017-02-20 DIAGNOSIS — M6281 Muscle weakness (generalized): Secondary | ICD-10-CM | POA: Diagnosis not present

## 2017-02-20 DIAGNOSIS — R2689 Other abnormalities of gait and mobility: Secondary | ICD-10-CM | POA: Diagnosis not present

## 2017-02-20 DIAGNOSIS — M25562 Pain in left knee: Secondary | ICD-10-CM | POA: Diagnosis not present

## 2017-02-20 DIAGNOSIS — R600 Localized edema: Secondary | ICD-10-CM | POA: Diagnosis not present

## 2017-02-20 DIAGNOSIS — M25662 Stiffness of left knee, not elsewhere classified: Secondary | ICD-10-CM | POA: Diagnosis not present

## 2017-02-20 NOTE — Therapy (Signed)
[facility] [address] [place], [place], [address] Phone: [phone]   Fax:  [phone]  Physical Therapy Treatment  Patient Details  Name: Kimberly [name] MRN: [id number] Date of Birth: 1950-09-14 Referring Provider: [name], MD  Encounter Date: 02/20/2017      PT End of Session - 02/20/17 0927    Visit Number 28   Number of Visits 30   Date for PT Re-Evaluation 03/11/17   PT Start Time 0844   PT Stop Time 0941   PT Time Calculation (min) 57 min   Activity Tolerance Patient tolerated treatment well   Behavior During Therapy [facility] for tasks assessed/performed      Past Medical History:  Diagnosis Date  . Anemia 2005   hb 11.5  . Arthritis   . DVT (deep venous thrombosis) ([place])    2003, coumadin x 6 mon  . GERD (gastroesophageal reflux disease) 2001   no problem now  . Hypertension   . Hypothyroidism    off meds  . Neuromuscular disorder ([place])    painful in L foot, being started on Gabapentin- 11/08/2016  . Osteopenia   . Vegetarian diet    eats fish and eggs; no meat    Past Surgical History:  Procedure Laterality Date  . JOINT REPLACEMENT    . KNEE ARTHROSCOPY Right 03/16/2003  . OOPHORECTOMY     "think it was my left"  . TOTAL KNEE ARTHROPLASTY Right 09/14/2014   Procedure: RIGHT TOTAL KNEE ARTHROPLASTY;  Surgeon: Garald Balding, MD;  Location: [place];  Service: Orthopedics;  Laterality: Right;  . TOTAL KNEE ARTHROPLASTY Left 11/20/2016  . TOTAL KNEE ARTHROPLASTY Left 11/20/2016   Procedure: LEFT TOTAL KNEE ARTHROPLASTY;  Surgeon: Garald Balding, MD;  Location: [place];  Service: Orthopedics;  Laterality: Left;    There were no vitals filed for this visit.      Subjective Assessment - 02/20/17 0850    Subjective I am tired from housework and entertaining.     Currently in Pain? No/denies                         [facility] Adult PT Treatment/Exercise - 02/20/17 0001       Knee/Hip Exercises: Aerobic   Stationary Bike L1 x 10 min   Nustep L4 x 10 min     Knee/Hip Exercises: Machines for Strengthening   Cybex Knee Flexion Bil 25# 2x15  VC to not extend her back   Total Gym Leg Press seat 6, 80# 3x10 bil, Lt only 35# 2x10     Knee/Hip Exercises: Standing   Rebounder weight shifting 1 min 3 ways   Walking with Sports Cord 25# forward/backward 10x, sidestepping 5x 20#     Knee/Hip Exercises: Seated   Long Arc Quad Strengthening;Left;3 sets;10 reps;Weights   Long Arc Quad Weight 3 lbs.     Vasopneumatic   Number Minutes Vasopneumatic  15 minutes   Vasopnuematic Location  Knee   Vasopneumatic Pressure Medium   Vasopneumatic Temperature  3 flakes                  PT Short Term Goals - 02/04/17 0859      PT SHORT TERM GOAL #3   Title report < or = to 3/10 Lt knee pain with standing and walking   Status Achieved     PT SHORT TERM GOAL #4   Title improve strength and endurance to  stand and walk > or = to 25 minutes to improve community ambulation   Baseline 15 minutes   Time 4   Period Weeks   Status On-going           PT Long Term Goals - 02/11/17 0856      PT LONG TERM GOAL #1   Title be independent in advanced HEP   Time 4   Period Weeks   Status On-going     PT LONG TERM GOAL #2   Title reduce FOTO to < or = to 42% limitation   Baseline 40% limitation   Status Achieved     PT LONG TERM GOAL #3   Title demonstrate Lt knee A/ROM flexion to > or = to 115 degrees to allow for squatting and descending steps without substitution   Baseline 100 degrees   Time 4   Period Weeks   Status On-going     PT LONG TERM GOAL #4   Title improve Lt knee strength and flexibility to ascend and descend steps with step-over-step gait with use of 1 rail   Baseline alternating with ascending at times, step-to with descending due to pain   Time 4   Period Weeks   Status On-going     PT LONG TERM GOAL #5   Title demonstrate 5/5 Lt  knee strength with good quad set to improve safety and endurance in the community   Baseline 4+/5 Lt quad and hamstring strength   Time 4   Period Weeks   Status On-going     PT LONG TERM GOAL #6   Title improve strength and endurance to ambulate for 30-45 minutes without limitation   Baseline 20 minutes due to fatigue   Time 4   Period Weeks   Status On-going               Plan - 02/20/17 0902    Clinical Impression Statement Pt has been on her feet a lot due to entertaining company at her house.  Pt reports some Lt ankle edema due to being on her feet.  Pt able to tolerate increased weight with both legs on leg press today.  Pt with 4 degree improvement in Lt knee A/ROM this week.  Pt will continue to benefit from skilled PT s/p Lt knee TKA for endurance, strength, A/ROM progression and edema management.     Rehab Potential Good   PT Frequency 3x / week   PT Duration 4 weeks   PT Treatment/Interventions Cryotherapy;ADLs/Self Care Home Management;Functional mobility training;Stair training;Gait training;Ultrasound;Moist Heat;Therapeutic activities;Therapeutic exercise;Balance training;Neuromuscular re-education;Manual techniques;Vasopneumatic Device;Taping   PT Next Visit Plan flexion ROM, LE strength, edema management, endurance    Recommended Other Services initial certification and recert have been signed   Consulted and Agree with Plan of Care Patient      Patient will benefit from skilled therapeutic intervention in order to improve the following deficits and impairments:  Abnormal gait, Decreased range of motion, Pain, Increased edema, Decreased strength, Decreased scar mobility, Impaired flexibility, Decreased activity tolerance, Decreased endurance  Visit Diagnosis: Acute pain of left knee  Localized edema  Other abnormalities of gait and mobility  Stiffness of left knee, not elsewhere classified  Muscle weakness (generalized)     Problem List Patient  Active Problem List   Diagnosis Date Noted  . S/P total knee replacement using cement, left 11/20/2016  . Osteopenia 02/28/2015  . Left lumbar radiculitis 02/28/2015  . Acute blood loss anemia 09/20/2014  .  Primary osteoarthritis of right knee 09/14/2014  . Osteoarthritis of left knee 09/14/2014  . Vitamin B 12 deficiency 07/29/2014  . Obesity (BMI 30-39.9) 12/08/2013  . Osteoarthritis, knee 05/30/2012  . Metabolic syndrome 79/39/0300  . DVT, HX OF 02/10/2010  . Vitamin D deficiency 08/25/2007  . Disorder of bone and cartilage 04/08/2007  . Hypothyroidism 11/18/2006  . ANEMIA-NOS 11/18/2006  . Essential hypertension 11/18/2006  . GERD 11/18/2006     Sigurd Sos, PT 02/20/17 9:31 AM  [facility] [address], [place], [place], [address] Phone: [phone]   Fax:  [phone]  Name: Kimberly [name] MRN: [id number] Date of Birth: 10-Apr-1951

## 2017-02-22 ENCOUNTER — Ambulatory Visit: Payer: Medicare Other | Admitting: Physical Therapy

## 2017-02-22 DIAGNOSIS — M25662 Stiffness of left knee, not elsewhere classified: Secondary | ICD-10-CM | POA: Diagnosis not present

## 2017-02-22 DIAGNOSIS — R600 Localized edema: Secondary | ICD-10-CM

## 2017-02-22 DIAGNOSIS — M25562 Pain in left knee: Secondary | ICD-10-CM

## 2017-02-22 DIAGNOSIS — M6281 Muscle weakness (generalized): Secondary | ICD-10-CM

## 2017-02-22 DIAGNOSIS — R2689 Other abnormalities of gait and mobility: Secondary | ICD-10-CM | POA: Diagnosis not present

## 2017-02-22 DIAGNOSIS — G8929 Other chronic pain: Secondary | ICD-10-CM | POA: Diagnosis not present

## 2017-02-22 NOTE — Therapy (Signed)
[facility] [address], [place], [place], [address] Phone: [phone]   Fax:  [phone]  Physical Therapy Treatment  Patient Details  Name: Kimberly [name] MRN: [id number] Date of Birth: 1950-07-16 Referring Provider: [name], MD  Encounter Date: 02/22/2017      PT End of Session - 02/22/17 0924    Visit Number 29   Number of Visits 30   Date for PT Re-Evaluation 03/11/17   PT Start Time 0918   PT Stop Time 1025   PT Time Calculation (min) 67 min   Activity Tolerance Patient tolerated treatment well   Behavior During Therapy [facility] for tasks assessed/performed      Past Medical History:  Diagnosis Date  . Anemia 2005   hb 11.5  . Arthritis   . DVT (deep venous thrombosis) ([place])    2003, coumadin x 6 mon  . GERD (gastroesophageal reflux disease) 2001   no problem now  . Hypertension   . Hypothyroidism    off meds  . Neuromuscular disorder ([place])    painful in L foot, being started on Gabapentin- 11/08/2016  . Osteopenia   . Vegetarian diet    eats fish and eggs; no meat    Past Surgical History:  Procedure Laterality Date  . JOINT REPLACEMENT    . KNEE ARTHROSCOPY Right 03/16/2003  . OOPHORECTOMY     "think it was my left"  . TOTAL KNEE ARTHROPLASTY Right 09/14/2014   Procedure: RIGHT TOTAL KNEE ARTHROPLASTY;  Surgeon: Garald Balding, MD;  Location: [place];  Service: Orthopedics;  Laterality: Right;  . TOTAL KNEE ARTHROPLASTY Left 11/20/2016  . TOTAL KNEE ARTHROPLASTY Left 11/20/2016   Procedure: LEFT TOTAL KNEE ARTHROPLASTY;  Surgeon: Garald Balding, MD;  Location: [place];  Service: Orthopedics;  Laterality: Left;    There were no vitals filed for this visit.      Subjective Assessment - 02/22/17 0925    Subjective My knee is sore today and I think doing a lot of in and out of the car made my ankle hurt.  My company left today.   Pertinent History Rt TKA 2016   Limitations  Standing;Walking   How long can you stand comfortably? 15 minutes   How long can you walk comfortably? 15 minutes   Patient Stated Goals improve Lt knee A/ROM, improve endurance, imrpove gait   Currently in Pain? Yes   Pain Score 2    Pain Location Knee   Pain Descriptors / Indicators Aching   Pain Type Surgical pain   Pain Radiating Towards ankle   Pain Onset More than a month ago   Pain Frequency Intermittent   Aggravating Factors  descending steps   Pain Relieving Factors ice   Effect of Pain on Daily Activities reduced endurance                         [facility] Adult PT Treatment/Exercise - 02/22/17 0001      Knee/Hip Exercises: Stretches   Knee: Self-Stretch to increase Flexion Left;5 reps;10 seconds  with strap and PT to provide overpressure   Gastroc Stretch Both;3 reps;30 seconds  using slant board     Knee/Hip Exercises: Aerobic   Stationary Bike L1 x 10 min   Nustep L4 x 10 min     Knee/Hip Exercises: Machines for Strengthening   Cybex Knee Flexion Bil 25# 2x15; single leg 15# 2x10  VC to not extend her  back   Total Gym Leg Press seat 6, 85# 3x10 bil, Lt only 40# 2x10     Knee/Hip Exercises: Standing   Rebounder weight shifting 1 min 3 ways   Walking with Sports Cord 25# forward/backward 10x, sidestepping 5x 20#     Knee/Hip Exercises: Seated   Long Arc Quad Strengthening;Left;3 sets;10 reps;Weights   Long Arc Quad Weight 3 lbs.     Vasopneumatic   Number Minutes Vasopneumatic  15 minutes   Vasopnuematic Location  Knee   Vasopneumatic Pressure Medium   Vasopneumatic Temperature  3 flakes                  PT Short Term Goals - 02/04/17 0859      PT SHORT TERM GOAL #3   Title report < or = to 3/10 Lt knee pain with standing and walking   Status Achieved     PT SHORT TERM GOAL #4   Title improve strength and endurance to stand and walk > or = to 25 minutes to improve community ambulation   Baseline 15 minutes   Time 4   Period  Weeks   Status On-going           PT Long Term Goals - 02/11/17 7342      PT LONG TERM GOAL #1   Title be independent in advanced HEP   Time 4   Period Weeks   Status On-going     PT LONG TERM GOAL #2   Title reduce FOTO to < or = to 42% limitation   Baseline 40% limitation   Status Achieved     PT LONG TERM GOAL #3   Title demonstrate Lt knee A/ROM flexion to > or = to 115 degrees to allow for squatting and descending steps without substitution   Baseline 100 degrees   Time 4   Period Weeks   Status On-going     PT LONG TERM GOAL #4   Title improve Lt knee strength and flexibility to ascend and descend steps with step-over-step gait with use of 1 rail   Baseline alternating with ascending at times, step-to with descending due to pain   Time 4   Period Weeks   Status On-going     PT LONG TERM GOAL #5   Title demonstrate 5/5 Lt knee strength with good quad set to improve safety and endurance in the community   Baseline 4+/5 Lt quad and hamstring strength   Time 4   Period Weeks   Status On-going     PT LONG TERM GOAL #6   Title improve strength and endurance to ambulate for 30-45 minutes without limitation   Baseline 20 minutes due to fatigue   Time 4   Period Weeks   Status On-going               Plan - 02/22/17 0929    Clinical Impression Statement Patient was able to entertain guests for the past several days due to improved endurance.  She was able to increase weight on leg press machine today.  Pt continues to have decreaesd ROM and strength for full return to function.  Left knee flexion measured at 103 degrees today after stretching.  She still struggles to get knee flexion for getting up and down stairs   PT Treatment/Interventions Cryotherapy;ADLs/Self Care Home Management;Functional mobility training;Stair training;Gait training;Ultrasound;Moist Heat;Therapeutic activities;Therapeutic exercise;Balance training;Neuromuscular re-education;Manual  techniques;Vasopneumatic Device;Taping   PT Next Visit Plan flexion ROM, LE strength, edema management, endurance  Consulted and Agree with Plan of Care Patient      Patient will benefit from skilled therapeutic intervention in order to improve the following deficits and impairments:  Abnormal gait, Decreased range of motion, Pain, Increased edema, Decreased strength, Decreased scar mobility, Impaired flexibility, Decreased activity tolerance, Decreased endurance  Visit Diagnosis: Acute pain of left knee  Localized edema  Other abnormalities of gait and mobility  Stiffness of left knee, not elsewhere classified  Muscle weakness (generalized)     Problem List Patient Active Problem List   Diagnosis Date Noted  . S/P total knee replacement using cement, left 11/20/2016  . Osteopenia 02/28/2015  . Left lumbar radiculitis 02/28/2015  . Acute blood loss anemia 09/20/2014  . Primary osteoarthritis of right knee 09/14/2014  . Osteoarthritis of left knee 09/14/2014  . Vitamin B 12 deficiency 07/29/2014  . Obesity (BMI 30-39.9) 12/08/2013  . Osteoarthritis, knee 05/30/2012  . Metabolic syndrome 62/86/3817  . DVT, HX OF 02/10/2010  . Vitamin D deficiency 08/25/2007  . Disorder of bone and cartilage 04/08/2007  . Hypothyroidism 11/18/2006  . ANEMIA-NOS 11/18/2006  . Essential hypertension 11/18/2006  . GERD 11/18/2006    Zannie Cove, PT 02/22/2017, 10:13 AM  [facility] [address], [place], [place], [address] Phone: [phone]   Fax:  [phone]  Name: Kimberly [name] MRN: [id number] Date of Birth: 1950/10/05

## 2017-02-25 ENCOUNTER — Ambulatory Visit: Payer: Medicare Other | Admitting: Physical Therapy

## 2017-02-26 ENCOUNTER — Encounter: Payer: Self-pay | Admitting: Family Medicine

## 2017-02-26 ENCOUNTER — Ambulatory Visit (INDEPENDENT_AMBULATORY_CARE_PROVIDER_SITE_OTHER): Payer: Medicare Other | Admitting: Family Medicine

## 2017-02-26 VITALS — BP 118/74 | HR 78 | Temp 98.4°F | Resp 12 | Ht 64.0 in | Wt 171.2 lb

## 2017-02-26 DIAGNOSIS — J989 Respiratory disorder, unspecified: Secondary | ICD-10-CM

## 2017-02-26 DIAGNOSIS — R0989 Other specified symptoms and signs involving the circulatory and respiratory systems: Secondary | ICD-10-CM

## 2017-02-26 DIAGNOSIS — J029 Acute pharyngitis, unspecified: Secondary | ICD-10-CM | POA: Diagnosis not present

## 2017-02-26 DIAGNOSIS — J069 Acute upper respiratory infection, unspecified: Secondary | ICD-10-CM | POA: Diagnosis not present

## 2017-02-26 MED ORDER — ALBUTEROL SULFATE (2.5 MG/3ML) 0.083% IN NEBU
2.5000 mg | INHALATION_SOLUTION | Freq: Once | RESPIRATORY_TRACT | Status: AC
  Administered 2017-02-26: 2.5 mg via RESPIRATORY_TRACT

## 2017-02-26 MED ORDER — ALBUTEROL SULFATE HFA 108 (90 BASE) MCG/ACT IN AERS: 2.0000 | INHALATION_SPRAY | Freq: Four times a day (QID) | RESPIRATORY_TRACT | 0 refills | Status: DC | PRN

## 2017-02-26 MED ORDER — PREDNISONE 20 MG PO TABS: 40.0000 mg | ORAL_TABLET | Freq: Every day | ORAL | 0 refills | Status: AC

## 2017-02-26 NOTE — Progress Notes (Signed)
ACUTE VISIT  HPI:  Chief Complaint  Patient presents with  . Sore Throat    [name] is a 66 y.o.female here today complaining of 3 days of sore throat and nonproductive cough. Sore throat is exacerbated by coughing spells.   Sore Throat   This is a new problem. The current episode started in the past 7 days. The problem has been unchanged. There has been no fever. The pain is moderate. Associated symptoms include coughing, a hoarse voice and a plugged ear sensation. Pertinent negatives include no abdominal pain, congestion, diarrhea, ear discharge, ear pain, headaches, shortness of breath, stridor, swollen glands, trouble swallowing or vomiting. She has had no exposure to strep or mono. She has tried acetaminophen for the symptoms. The treatment provided mild relief.   She has not taken Tylenol today.  No Hx of recent travel. + Sick contact, husband had similar symptoms recently. No known insect bite.  Hx of allergies: Denies  On examination today wheezing appreciated. She denies Hx of asthma and no Hx of tobacco use. Denies chest pain, dyspnea, palpitation,edema, orthopnea,or PND.   Review of Systems  Constitutional: Positive for appetite change and fatigue. Negative for activity change, chills and fever.  HENT: Positive for hoarse voice, sore throat and voice change. Negative for congestion, ear discharge, ear pain, mouth sores, sinus pressure and trouble swallowing.   Eyes: Negative for discharge and redness.  Respiratory: Positive for cough. Negative for shortness of breath and stridor.   Cardiovascular: Negative for chest pain and leg swelling.  Gastrointestinal: Negative for abdominal pain, diarrhea, nausea and vomiting.  Genitourinary: Negative for decreased urine volume and hematuria.  Musculoskeletal: Negative for gait problem and myalgias.  Skin: Negative for pallor and rash.  Allergic/Immunologic: Negative for environmental allergies.    Neurological: Negative for syncope, weakness and headaches.  Hematological: Negative for adenopathy. Does not bruise/bleed easily.  Psychiatric/Behavioral: Negative for confusion. The patient is not nervous/anxious.       Current Outpatient Prescriptions on File Prior to Visit  Medication Sig Dispense Refill  . acetaminophen (TYLENOL) 500 MG tablet Take 1,000 mg by mouth daily as needed for moderate pain.    Marland Kitchen b complex vitamins tablet Take 1 tablet by mouth 4 (four) times a week.    . Calcium Carbonate (CALCIUM 600 PO) Take 600 mg by mouth daily.    . cholecalciferol (VITAMIN D) 1000 units tablet Take 1,000 Units by mouth daily.    . irbesartan (AVAPRO) 150 MG tablet Take 1 tablet (150 mg total) by mouth daily. 90 tablet 3  . levothyroxine (SYNTHROID, LEVOTHROID) 50 MCG tablet TAKE 1 TABLET ONE TIME DAILY (Patient taking differently: Take 50 mcg by mouth daily before breakfast. ) 90 tablet 3   No current facility-administered medications on file prior to visit.      Past Medical History:  Diagnosis Date  . Anemia 2005   hb 11.5  . Arthritis   . DVT (deep venous thrombosis) ([place])    2003, coumadin x 6 mon  . GERD (gastroesophageal reflux disease) 2001   no problem now  . Hypertension   . Hypothyroidism    off meds  . Neuromuscular disorder ([place])    painful in L foot, being started on Gabapentin- 11/08/2016  . Osteopenia   . Vegetarian diet    eats fish and eggs; no meat   Allergies  Allergen Reactions  . Enoxaparin Sodium Itching and Swelling    SWELLING REACTION UNSPECIFIED  Social History   Social History  . Marital status: Married    Spouse name: N/A  . Number of children: N/A  . Years of education: N/A   Social History Main Topics  . Smoking status: Never Smoker  . Smokeless tobacco: Never Used  . Alcohol use No  . Drug use: No  . Sexual activity: Not Asked   Other Topics Concern  . None   Social History Narrative   Married   Never smoker    Alcohol use-no   Regular exercise- yes. Walks regularly   Brother-alcohol   Illicit drug- no   Daily Caffeine use- 2 cups of tea daily    Vitals:   02/26/17 1124  BP: 118/74  Pulse: 78  Resp: 12  Temp: 98.4 F (36.9 C)  SpO2: 98%   Body mass index is 29.39 kg/m.   Physical Exam  Nursing note and vitals reviewed. Constitutional: She is oriented to person, place, and time. She appears well-developed. She does not appear ill. No distress.  HENT:  Head: Normocephalic and atraumatic.  Right Ear: Tympanic membrane, external ear and ear canal normal.  Left Ear: Tympanic membrane, external ear and ear canal normal.  Nose: Septal deviation present. Right sinus exhibits no maxillary sinus tenderness and no frontal sinus tenderness. Left sinus exhibits no maxillary sinus tenderness and no frontal sinus tenderness.  Mouth/Throat: Uvula is midline and mucous membranes are normal. Posterior oropharyngeal erythema present. No oropharyngeal exudate, posterior oropharyngeal edema or tonsillar abscesses.  Mild dysphonia.  Eyes: Conjunctivae are normal.  Neck: No muscular tenderness present. No edema and no erythema present.  Cardiovascular: Normal rate and regular rhythm.   No murmur heard. Respiratory: Effort normal. No stridor. No respiratory distress. She has wheezes. She has no rales.  Lymphadenopathy:       Head (right side): No submandibular adenopathy present.       Head (left side): No submandibular adenopathy present.    She has cervical adenopathy.       Right cervical: Posterior cervical adenopathy present.       Left cervical: Posterior cervical adenopathy present.  Neurological: She is alert and oriented to person, place, and time. She has normal strength. Coordination and gait normal.  Skin: Skin is warm. No rash noted. No erythema.  Psychiatric: She has a normal mood and affect. Her speech is normal.  Well groomed, good eye contact.    ASSESSMENT AND PLAN:  [name] was  seen today for sore throat.  Diagnoses and all orders for this visit:  Sore throat  Rapid strep negative.  URI, acute  Symptoms suggests a viral etiology, so I do not think abx is needed at this time. Instructed to monitor for signs of complications,clearly instructed about warning signs. I also explained that cough could last a few days and sometimes weeks. F/U as needed.  Reactive airway disease that is not asthma  No prior Hx. Here in the office she received treatment with DuoNeb, which she tolerated well. Lung auscultation after breathing treatment negative for rales or rhonchi, wheezing greatly improved. After discussion of some side effects of Prednisone, she agrees to take it for 3 days. Albuterol inh 2 puff every 6 hours for a week then as needed for wheezing or shortness of breath.  Further recommendations would be given according to CXR. Follow-up in 7-10 days with PCP, before if needed.  -     albuterol (PROVENTIL HFA;VENTOLIN HFA) 108 (90 Base) MCG/ACT inhaler; Inhale 2 puffs into  the lungs every 6 (six) hours as needed for wheezing or shortness of breath. -     predniSONE (DELTASONE) 20 MG tablet; Take 2 tablets (40 mg total) by mouth daily with breakfast. -     DG Chest 2 View; Future     -Ms. Marelin Tat advised to seek attention immediately if symptoms worsen.       Tonjia Parillo G. [place], MD  [facility] [place] office.

## 2017-02-26 NOTE — Patient Instructions (Addendum)
  Kimberly [name] I have seen you today for an acute visit.  A few things to remember from today's visit:   URI, acute  Reactive airway disease that is not asthma - Plan: albuterol (PROVENTIL HFA;VENTOLIN HFA) 108 (90 Base) MCG/ACT inhaler, predniSONE (DELTASONE) 20 MG tablet, DG Chest 2 View   Today X ray was ordered.  This can be done at [facility] between 8 am and 5 pm: [place] [phone].   Albuterol inh 2 puff every 6 hours for a week then as needed for wheezing or shortness of breath.    In general please monitor for signs of worsening symptoms and seek immediate medical attention if any concerning.    I hope you get better soon!

## 2017-02-27 ENCOUNTER — Encounter: Payer: Medicare Other | Admitting: Physical Therapy

## 2017-03-01 ENCOUNTER — Encounter: Payer: Self-pay | Admitting: Physical Therapy

## 2017-03-01 ENCOUNTER — Ambulatory Visit: Payer: Medicare Other | Admitting: Physical Therapy

## 2017-03-01 DIAGNOSIS — M25662 Stiffness of left knee, not elsewhere classified: Secondary | ICD-10-CM

## 2017-03-01 DIAGNOSIS — M25562 Pain in left knee: Secondary | ICD-10-CM

## 2017-03-01 DIAGNOSIS — R2689 Other abnormalities of gait and mobility: Secondary | ICD-10-CM | POA: Diagnosis not present

## 2017-03-01 DIAGNOSIS — R600 Localized edema: Secondary | ICD-10-CM

## 2017-03-01 DIAGNOSIS — G8929 Other chronic pain: Secondary | ICD-10-CM

## 2017-03-01 DIAGNOSIS — M6281 Muscle weakness (generalized): Secondary | ICD-10-CM

## 2017-03-01 NOTE — Therapy (Addendum)
[facility] [address] [place], [place], [address] Phone: [phone]   Fax:  [phone]  Physical Therapy Treatment  Patient Details  Name: Kimberly [name] MRN: [id number] Date of Birth: [birth year] Referring Provider: [name], MD  Encounter Date: 03/01/2017      PT End of Session - 03/01/17 0849    Visit Number 30   Number of Visits 30   Date for PT Re-Evaluation 03/11/17   PT Start Time 0840   PT Stop Time 0945   PT Time Calculation (min) 65 min   Activity Tolerance Patient tolerated treatment well   Behavior During Therapy [facility] for tasks assessed/performed      Past Medical History:  Diagnosis Date  . Anemia 2005   hb 11.5  . Arthritis   . DVT (deep venous thrombosis) ([place])    2003, coumadin x 6 mon  . GERD (gastroesophageal reflux disease) 2001   no problem now  . Hypertension   . Hypothyroidism    off meds  . Neuromuscular disorder ([place])    painful in L foot, being started on Gabapentin- 11/08/2016  . Osteopenia   . Vegetarian diet    eats fish and eggs; no meat    Past Surgical History:  Procedure Laterality Date  . JOINT REPLACEMENT    . KNEE ARTHROSCOPY Right 03/16/2003  . OOPHORECTOMY     "think it was my left"  . TOTAL KNEE ARTHROPLASTY Right 09/14/2014   Procedure: RIGHT TOTAL KNEE ARTHROPLASTY;  Surgeon: Garald Balding, MD;  Location: [place];  Service: Orthopedics;  Laterality: Right;  . TOTAL KNEE ARTHROPLASTY Left 11/20/2016  . TOTAL KNEE ARTHROPLASTY Left 11/20/2016   Procedure: LEFT TOTAL KNEE ARTHROPLASTY;  Surgeon: Garald Balding, MD;  Location: [place];  Service: Orthopedics;  Laterality: Left;    There were no vitals filed for this visit.      Subjective Assessment - 03/01/17 0852    Subjective Recovering from sickness, has mainly rested. Knee is stiff.    Currently in Pain? No/denies  Stiffness   Multiple Pain Sites No            [facility] PT Assessment -  03/01/17 0001      Observation/Other Assessments   Focus on Therapeutic Outcomes (FOTO)  40% limitation  CK                     [facility] Adult PT Treatment/Exercise - 03/01/17 0001      Knee/Hip Exercises: Stretches   Knee: Self-Stretch to increase Flexion --  On step/lunges 20x     Knee/Hip Exercises: Aerobic   Stationary Bike L1 x 10 min   Nustep L4 x 10 min     Knee/Hip Exercises: Machines for Strengthening   Cybex Knee Flexion Bil 25# 2x15; single leg 15# 2x10  VC to not extend her back   Total Gym Leg Press seat 6, 85# 3x10 bil, Lt only 40# 2x10     Knee/Hip Exercises: Standing   Knee Flexion AROM;Left;2 sets;10 reps  VC to keep pelvis still                  PT Short Term Goals - 03/01/17 0915      PT SHORT TERM GOAL #4   Title improve strength and endurance to stand and walk > or = to 25 minutes to improve community ambulation   Baseline 15 minutes   Period Weeks   Status On-going  15 min           PT Long Term Goals - 02/11/17 0856      PT LONG TERM GOAL #1   Title be independent in advanced HEP   Time 4   Period Weeks   Status On-going     PT LONG TERM GOAL #2   Title reduce FOTO to < or = to 42% limitation   Baseline 40% limitation   Status Achieved     PT LONG TERM GOAL #3   Title demonstrate Lt knee A/ROM flexion to > or = to 115 degrees to allow for squatting and descending steps without substitution   Baseline 100 degrees   Time 4   Period Weeks   Status On-going     PT LONG TERM GOAL #4   Title improve Lt knee strength and flexibility to ascend and descend steps with step-over-step gait with use of 1 rail   Baseline alternating with ascending at times, step-to with descending due to pain   Time 4   Period Weeks   Status On-going     PT LONG TERM GOAL #5   Title demonstrate 5/5 Lt knee strength with good quad set to improve safety and endurance in the community   Baseline 4+/5 Lt quad and hamstring strength    Time 4   Period Weeks   Status On-going     PT LONG TERM GOAL #6   Title improve strength and endurance to ambulate for 30-45 minutes without limitation   Baseline 20 minutes due to fatigue   Time 4   Period Weeks   Status On-going               Plan - 03/01/17 0851    Clinical Impression Statement Pt has been sick the beginning of the week. She reports her knee feels stiff from doing no HEP and resting/sleeping.  She has no problem with the resistive exercises but struggles with flexion greater than 100 degrees. She reports she plans to resume more stretching at home now that she is feeling better. She goes to [place] in 1 month.    Rehab Potential Good   PT Frequency 3x / week   PT Duration 4 weeks   PT Treatment/Interventions Cryotherapy;ADLs/Self Care Home Management;Functional mobility training;Stair training;Gait training;Ultrasound;Moist Heat;Therapeutic activities;Therapeutic exercise;Balance training;Neuromuscular re-education;Manual techniques;Vasopneumatic Device;Taping   PT Next Visit Plan flexion ROM, LE strength, edema management, endurance    Consulted and Agree with Plan of Care Patient      Patient will benefit from skilled therapeutic intervention in order to improve the following deficits and impairments:  Abnormal gait, Decreased range of motion, Pain, Increased edema, Decreased strength, Decreased scar mobility, Impaired flexibility, Decreased activity tolerance, Decreased endurance  Visit Diagnosis: Acute pain of left knee  Localized edema  Other abnormalities of gait and mobility  Stiffness of left knee, not elsewhere classified  Muscle weakness (generalized)  Chronic pain of left knee  Gcodes: mobility and walking Current CJ Goal CJ   Problem List Patient Active Problem List   Diagnosis Date Noted  . S/P total knee replacement using cement, left 11/20/2016  . Osteopenia 02/28/2015  . Left lumbar radiculitis 02/28/2015  . Acute blood loss  anemia 09/20/2014  . Primary osteoarthritis of right knee 09/14/2014  . Osteoarthritis of left knee 09/14/2014  . Vitamin B 12 deficiency 07/29/2014  . Obesity (BMI 30-39.9) 12/08/2013  . Osteoarthritis, knee 05/30/2012  . Metabolic syndrome 16/02/9603  . DVT, HX OF 02/10/2010  .  Vitamin D deficiency 08/25/2007  . Disorder of bone and cartilage 04/08/2007  . Hypothyroidism 11/18/2006  . ANEMIA-NOS 11/18/2006  . Essential hypertension 11/18/2006  . GERD 11/18/2006    Naomy Esham, PTA 03/01/2017, 9:27 AM  [facility] [address] [place], [place], [address] Phone: [phone]   Fax:  [phone]  Name: Catie Chiao MRN: [id number] Date of Birth: Sep 19, 1950  Zannie Cove, PT 03/01/17 10:03 AM

## 2017-03-04 ENCOUNTER — Encounter: Payer: Self-pay | Admitting: Physical Therapy

## 2017-03-04 ENCOUNTER — Ambulatory Visit: Payer: Medicare Other | Admitting: Physical Therapy

## 2017-03-04 ENCOUNTER — Other Ambulatory Visit: Payer: Self-pay | Admitting: Family Medicine

## 2017-03-04 DIAGNOSIS — Z1231 Encounter for screening mammogram for malignant neoplasm of breast: Secondary | ICD-10-CM

## 2017-03-04 DIAGNOSIS — M25562 Pain in left knee: Secondary | ICD-10-CM

## 2017-03-04 DIAGNOSIS — R2689 Other abnormalities of gait and mobility: Secondary | ICD-10-CM

## 2017-03-04 DIAGNOSIS — M6281 Muscle weakness (generalized): Secondary | ICD-10-CM | POA: Diagnosis not present

## 2017-03-04 DIAGNOSIS — R600 Localized edema: Secondary | ICD-10-CM | POA: Diagnosis not present

## 2017-03-04 DIAGNOSIS — M25662 Stiffness of left knee, not elsewhere classified: Secondary | ICD-10-CM

## 2017-03-04 DIAGNOSIS — G8929 Other chronic pain: Secondary | ICD-10-CM

## 2017-03-04 NOTE — Therapy (Signed)
[facility] [address] [place], [place], [address] Phone: [phone]   Fax:  [phone]  Physical Therapy Treatment  Patient Details  Name: Kimberly [name] MRN: [id number] Date of Birth: 1951-03-17 Referring Provider: [name], MD  Encounter Date: 03/04/2017      PT End of Session - 03/04/17 0852    Visit Number 31   Date for PT Re-Evaluation 03/11/17   PT Start Time 0842   PT Stop Time 0945   PT Time Calculation (min) 63 min   Activity Tolerance Patient tolerated treatment well   Behavior During Therapy [facility] for tasks assessed/performed      Past Medical History:  Diagnosis Date  . Anemia 2005   hb 11.5  . Arthritis   . DVT (deep venous thrombosis) ([place])    2003, coumadin x 6 mon  . GERD (gastroesophageal reflux disease) 2001   no problem now  . Hypertension   . Hypothyroidism    off meds  . Neuromuscular disorder ([place])    painful in L foot, being started on Gabapentin- 11/08/2016  . Osteopenia   . Vegetarian diet    eats fish and eggs; no meat    Past Surgical History:  Procedure Laterality Date  . JOINT REPLACEMENT    . KNEE ARTHROSCOPY Right 03/16/2003  . OOPHORECTOMY     "think it was my left"  . TOTAL KNEE ARTHROPLASTY Right 09/14/2014   Procedure: RIGHT TOTAL KNEE ARTHROPLASTY;  Surgeon: Garald Balding, MD;  Location: [place];  Service: Orthopedics;  Laterality: Right;  . TOTAL KNEE ARTHROPLASTY Left 11/20/2016  . TOTAL KNEE ARTHROPLASTY Left 11/20/2016   Procedure: LEFT TOTAL KNEE ARTHROPLASTY;  Surgeon: Garald Balding, MD;  Location: [place];  Service: Orthopedics;  Laterality: Left;    There were no vitals filed for this visit.      Subjective Assessment - 03/04/17 0854    Subjective Did not do too much over the weekend.    Pertinent History Rt TKA 2016   Currently in Pain? No/denies   Multiple Pain Sites No                         [facility] Adult PT  Treatment/Exercise - 03/04/17 0001      Knee/Hip Exercises: Aerobic   Stationary Bike L1 x 10 min   Nustep L4 x 10 min     Knee/Hip Exercises: Machines for Strengthening   Cybex Knee Flexion Bil 25# 2x15; single leg 15# 2x10  VC to not extend her back   Total Gym Leg Press seat 6, 85# 3x10 bil, Lt only 40# 2x10     Knee/Hip Exercises: Supine   Bridges with Clamshell Strengthening;Both;2 sets;10 reps  Green band     Knee/Hip Exercises: Sidelying   Hip ABduction Strengthening;Left;1 set;10 reps     Vasopneumatic   Number Minutes Vasopneumatic  15 minutes   Vasopnuematic Location  Knee   Vasopneumatic Pressure Medium   Vasopneumatic Temperature  3 flakes  Game ready was also done on previous visit despite notation                PT Education - 03/04/17 0917    Education provided Yes   Education Details bed exercises for HEP, bridge and sidelying abduction   Person(s) Educated Patient   Methods Explanation;Demonstration;Tactile cues;Verbal cues;Handout   Comprehension Verbalized understanding;Returned demonstration          PT Short Term Goals -  03/01/17 0915      PT SHORT TERM GOAL #4   Title improve strength and endurance to stand and walk > or = to 25 minutes to improve community ambulation   Baseline 15 minutes   Period Weeks   Status On-going  15 min           PT Long Term Goals - 02/11/17 0856      PT LONG TERM GOAL #1   Title be independent in advanced HEP   Time 4   Period Weeks   Status On-going     PT LONG TERM GOAL #2   Title reduce FOTO to < or = to 42% limitation   Baseline 40% limitation   Status Achieved     PT LONG TERM GOAL #3   Title demonstrate Lt knee A/ROM flexion to > or = to 115 degrees to allow for squatting and descending steps without substitution   Baseline 100 degrees   Time 4   Period Weeks   Status On-going     PT LONG TERM GOAL #4   Title improve Lt knee strength and flexibility to ascend and descend steps  with step-over-step gait with use of 1 rail   Baseline alternating with ascending at times, step-to with descending due to pain   Time 4   Period Weeks   Status On-going     PT LONG TERM GOAL #5   Title demonstrate 5/5 Lt knee strength with good quad set to improve safety and endurance in the community   Baseline 4+/5 Lt quad and hamstring strength   Time 4   Period Weeks   Status On-going     PT LONG TERM GOAL #6   Title improve strength and endurance to ambulate for 30-45 minutes without limitation   Baseline 20 minutes due to fatigue   Time 4   Period Weeks   Status On-going               Plan - 03/04/17 7628    Clinical Impression Statement Pt still recovering from bronchial sickness but is much better. She has not been able to put much effort into her HEP over the last 2 weeks mainly due to fatigue and low endurance overall.    Rehab Potential Good   PT Frequency 3x / week   PT Duration 4 weeks   PT Treatment/Interventions Cryotherapy;ADLs/Self Care Home Management;Functional mobility training;Stair training;Gait training;Ultrasound;Moist Heat;Therapeutic activities;Therapeutic exercise;Balance training;Neuromuscular re-education;Manual techniques;Vasopneumatic Device;Taping   PT Next Visit Plan flexion ROM, LE strength, edema management, endurance, measure knee FRiday   Consulted and Agree with Plan of Care Patient      Patient will benefit from skilled therapeutic intervention in order to improve the following deficits and impairments:  Abnormal gait, Decreased range of motion, Pain, Increased edema, Decreased strength, Decreased scar mobility, Impaired flexibility, Decreased activity tolerance, Decreased endurance  Visit Diagnosis: Acute pain of left knee  Localized edema  Other abnormalities of gait and mobility  Stiffness of left knee, not elsewhere classified  Muscle weakness (generalized)  Chronic pain of left knee     Problem List Patient Active  Problem List   Diagnosis Date Noted  . S/P total knee replacement using cement, left 11/20/2016  . Osteopenia 02/28/2015  . Left lumbar radiculitis 02/28/2015  . Acute blood loss anemia 09/20/2014  . Primary osteoarthritis of right knee 09/14/2014  . Osteoarthritis of left knee 09/14/2014  . Vitamin B 12 deficiency 07/29/2014  . Obesity (BMI 30-39.9) 12/08/2013  .  Osteoarthritis, knee 05/30/2012  . Metabolic syndrome 97/91/5041  . DVT, HX OF 02/10/2010  . Vitamin D deficiency 08/25/2007  . Disorder of bone and cartilage 04/08/2007  . Hypothyroidism 11/18/2006  . ANEMIA-NOS 11/18/2006  . Essential hypertension 11/18/2006  . GERD 11/18/2006    Nekesha Font, PTA 03/04/2017, 9:58 AM  [facility] [address], [place], [place], [address] Phone: [phone]   Fax:  [phone]  Name: Kimberly [name] MRN: [id number] Date of Birth: 03/23/1951

## 2017-03-04 NOTE — Patient Instructions (Signed)
Flexors, Supine Bridge    Green band around knees. Lie supine,press band out first,  feet shoulder-width apart. Lift hips toward ceiling. Hold __5_ seconds. Repeat _10__ times per session. Do __2 sessions per day.  Copyright  VHI. All rights reserved.   ABDUCTION: Side-Lying (Active)    Lie on left side, top leg straight. Raise top leg as far as possible. No weights yet! You CAN bend your bottom knee for balance. Knee straight! Complete 2___ sets of _10__ repetitions. Perform 2___ sessions per day.  http://gtsc.exer.us/95   Copyright  VHI. All rights reserved.

## 2017-03-05 ENCOUNTER — Ambulatory Visit (INDEPENDENT_AMBULATORY_CARE_PROVIDER_SITE_OTHER): Payer: Medicare Other | Admitting: Family Medicine

## 2017-03-05 ENCOUNTER — Encounter: Payer: Self-pay | Admitting: Family Medicine

## 2017-03-05 VITALS — BP 110/70 | HR 84 | Temp 98.0°F | Wt 173.7 lb

## 2017-03-05 DIAGNOSIS — Z7189 Other specified counseling: Secondary | ICD-10-CM

## 2017-03-05 DIAGNOSIS — G5792 Unspecified mononeuropathy of left lower limb: Secondary | ICD-10-CM

## 2017-03-05 DIAGNOSIS — J069 Acute upper respiratory infection, unspecified: Secondary | ICD-10-CM

## 2017-03-05 DIAGNOSIS — Z7184 Encounter for health counseling related to travel: Secondary | ICD-10-CM

## 2017-03-05 MED ORDER — AMOXICILLIN 500 MG PO CAPS: ORAL_CAPSULE | ORAL | 0 refills | Status: DC

## 2017-03-05 MED ORDER — TYPHOID VACCINE PO CPDR: 1.0000 | DELAYED_RELEASE_CAPSULE | ORAL | 0 refills | Status: DC

## 2017-03-05 NOTE — Patient Instructions (Signed)
Food Poisoning and Traveling Food poisoning is an illness caused by organisms present in something you ate or drank. Some types of food poisoning trigger symptoms quickly. Others may take 1-2 weeks for symptoms to appear. Symptoms of food poisoning include:  Diarrhea.  Cramping.  Fever.  Vomiting.  Dizziness.  Aches and pains.  Before you travel, learn as much as you can about the foodborne illnesses that are common in the areas where you are going. The risk for food poisoning varies from country to country and from one region of the world to another. Countries with a low risk include:  The [place].  [place].  [place].  [place].  [place].  Some countries in [place].  Countries with a mid-range risk include:  Countries in [place].  [place].  Some [place].  Countries with a high risk include:  Countries in [place].  Countries in the [place] in [place].  [place].  Countries in [place] and [place].  What types of illness can be passed through food and drinks? Most cases of food poisoning are caused by bacteria or viruses, such as:  E. coli.  Campylobacteriosis.  Shigellosis.  Salmonellosis.  Norovirus.  Rotavirus.  Astrovirus.  Food poisoning can also be caused by some microscopic parasites. These are organisms that live off of another larger organism. Illness caused by parasites can take 1-2 weeks to appear and may last several months. The illnesses include:  Giardiasis.  Amebiasis.  Cyclosporiasis.  Cryptosporidiosis.  Medicines are available to treat these infections. How can I decrease my risk of food poisoning while traveling? Good hand hygiene always helps protect your health. Carry small bottles of alcohol-based hand sanitizer. Use it to clean your hands before you eat. Also follow these basic guidelines for eating and drinking while traveling. Foods that are generally safe to  eat:  Food that is thoroughly cooked.  Food that is served hot.  Hard-boiled eggs.  Fruits and vegetables you wash and peel yourself.  Milk or cheese that is treated with high heat (pasteurized).  Foods to avoid:  Raw or undercooked foods.  Raw or runny eggs.  Food that is not hot (such as food that has been on a buffet or picnic table for a while).  Raw fruits or vegetables that have not been washed and peeled.  Other items made with fresh vegetables or fruits, like salad and salsa.  Milk or cheese that is not pasteurized.  Meat from local animals, such as monkeys and bats.  Drinks that are generally safe:  Bottled waters, soda, or sports drinks.  Drinks you know were sealed until you opened them.  Water you know has been treated, boiled, or filtered to remove microorganisms.  Ice from treated or bottled water.  Drinks made with boiling water, such as tea or coffee.  Pasteurized milk.  Drinks to avoid:  Water from the tap or a well.  Water from a fresh water source, such as a stream.  Ice from a tap, well, or fresh water source.  Beverages that include water from a well, tap, or fresh water source.  Milk that is not pasteurized.  Beverages from soda fountains.  What should I do if I think I have developed food poisoning while traveling?  If you have been vomiting or have diarrhea, drink water or other fluids to replace what you lost.  Take over-the-counter medicine to stop diarrhea. Consider packing a supply of antidiarrheal medicine to take with you.  Contact your health care provider if your symptoms do not clear up after a few days. How is food poisoning treated? Most cases of food poisoning go away without treatment within 48 hours. Food poisoning caused by bacteria may be treated with antibiotic medicines.Viruses cannot be treated with antibiotics.Illnesses caused by parasites might respond to antiparasitic medicines. You can also treat symptoms  of food poisoning with medicines to:  Prevent or slow diarrhea (antidiarrheals).  Rehydrate your body. Oral rehydration therapy (ORT) helps your body replace fluids and electrolytes lost in diarrhea or vomit.  Treat rashes caused by diarrhea using hydrocortisone cream.  Should I be proactively treated for food poisoning before I travel? Talk to your health care provider about your personal risk for contracting food poisoning while traveling. Discuss the foodborne illnesses that are common in the areas you plan to visit. Make sure you understand how to use any medicines your health care provider recommends. Some medicines can help prevent food poisoning. These include:  Bismuth subsalicylate (BSS). This is an ingredient in over-the-counter antidiarrheal medicines. It might help some adult travelers prevent illness.  Probiotics or "good" bacteria. Taking probiotics might help prevent some illnesses.  Antibiotics. These protect against bacteria only. Taking antibiotics to prevent food poisoning is usually suggested only for people who have a compromised immune system.  How can I learn more?  Visit a travel medicine clinic or speak with a health care provider who specializes in travel medicine as soon as you know your travel plans.  Check the Travelers' Health section on the website of the Centers for Disease Control and Prevention (CDC): WaveHands.com.ee This information is not intended to replace advice given to you by your health care provider. Make sure you discuss any questions you have with your health care provider. Document Released: 07/21/2002 Document Revised: 09/27/2015 Document Reviewed: 08/07/2013 Elsevier Interactive Patient Education  2018 [employer].  Consider Hepatitis A vaccine and let us know if interested

## 2017-03-05 NOTE — Progress Notes (Signed)
Subjective:     Patient ID: Kimberly [name], female   DOB: 05-04-1951, 66 y.o.   MRN: [id number]  HPI Patient here to discuss multiple issues today as follows  Recent URI. Was here last week and strep screen negative. She was treated with 3 days of prednisone. Her cough is improving. She never went for chest x-ray. No fever this point. Took some Tylenol Sinus which seemed to help her sinus congestion symptoms. No bloody drainage. No headaches.  Patient preparing to go back to [place]. She'll be there > 2 months. She plans to have 2 dental procedures there and has had prior joint replacement. She is requesting prophylactic antibiotics. She's taken amoxicillin in the past.  Patient has not had prior typhoid vaccination nor any knowledge of hepatitis a. She also still needs influenza vaccine. She plans to be in [place] for over 2 months. She has no contraindications for typhoid vaccination. The prednisone she took recently was only for 3 days and that was a week ago.  She does not take any immunosuppressive medications  She's had some ongoing neuropathy pains basically confined to the left foot. She took gabapentin which did not seem to help much. She was specifically inquiring about amitriptyline and expressed her concerns about anticholinergics at her age  Past Medical History:  Diagnosis Date  . Anemia 2005   hb 11.5  . Arthritis   . DVT (deep venous thrombosis) ([place])    2003, coumadin x 6 mon  . GERD (gastroesophageal reflux disease) 2001   no problem now  . Hypertension   . Hypothyroidism    off meds  . Neuromuscular disorder ([place])    painful in L foot, being started on Gabapentin- 11/08/2016  . Osteopenia   . Vegetarian diet    eats fish and eggs; no meat   Past Surgical History:  Procedure Laterality Date  . JOINT REPLACEMENT    . KNEE ARTHROSCOPY Right 03/16/2003  . OOPHORECTOMY     "think it was my left"  . TOTAL KNEE ARTHROPLASTY Right 09/14/2014   Procedure: RIGHT TOTAL KNEE  ARTHROPLASTY;  Surgeon: Garald Balding, MD;  Location: [place];  Service: Orthopedics;  Laterality: Right;  . TOTAL KNEE ARTHROPLASTY Left 11/20/2016  . TOTAL KNEE ARTHROPLASTY Left 11/20/2016   Procedure: LEFT TOTAL KNEE ARTHROPLASTY;  Surgeon: Garald Balding, MD;  Location: [place];  Service: Orthopedics;  Laterality: Left;    reports that she has never smoked. She has never used smokeless tobacco. She reports that she does not drink alcohol or use drugs. family history includes Alcohol abuse in her other; Diabetes in her mother and other; Heart failure in her father. Allergies  Allergen Reactions  . Enoxaparin Sodium Itching and Swelling    SWELLING REACTION UNSPECIFIED      Review of Systems  Constitutional: Negative for chills, fatigue, fever and unexpected weight change.  Eyes: Negative for visual disturbance.  Respiratory: Positive for cough. Negative for chest tightness, shortness of breath and wheezing.   Cardiovascular: Negative for chest pain, palpitations and leg swelling.  Gastrointestinal: Negative for abdominal pain.  Endocrine: Negative for polydipsia and polyuria.  Genitourinary: Negative for dysuria.  Neurological: Negative for dizziness, seizures, syncope, weakness, light-headedness and headaches.       Objective:   Physical Exam  Constitutional: She appears well-developed and well-nourished.  HENT:  Right Ear: External ear normal.  Left Ear: External ear normal.  Nose: Nose normal.  Mouth/Throat: Oropharynx is clear and moist.  Neck: Neck supple.  Cardiovascular: Normal rate and regular rhythm.   Pulmonary/Chest: Effort normal and breath sounds normal. No respiratory distress. She has no wheezes. She has no rales.  Musculoskeletal: She exhibits no edema.  Lymphadenopathy:    She has no cervical adenopathy.       Assessment:     #1 resolving viral URI  #2 travel medicine advice encounter. See recommendations below  #3 patient requesting  prophylaxis for dental procedure with prior history of joint replacement  #4 neuropathy symptoms involving left foot-relatively mild    Plan:     -we discussed prevention of illness related to travel. We discussed importance of protected food or water sources at all times. We have recommended she take oral typhoid vaccine one every other day for 4 doses.  Also recommended Hep A vaccine and flu vaccine and she wishes to defer until she checks with insurance coverage regarding hepatitis A. -Amoxicillin 500 mg 4 capsules 1 dose 1 hour prior to dental procedure -We discussed Kimberly [name]'s options for treating neuropathic pain including gabapentin, Lyrica, Cymbalta. Would lean against using anticholinergics at her age.  At this point, she has elected to observe since her symptoms are relatively mild. She may consider trial of over-the-counter Zostrix cream to see if that helps  Kimberly Post MD [facility]

## 2017-03-06 ENCOUNTER — Ambulatory Visit: Payer: Medicare Other

## 2017-03-06 DIAGNOSIS — R600 Localized edema: Secondary | ICD-10-CM | POA: Diagnosis not present

## 2017-03-06 DIAGNOSIS — M25662 Stiffness of left knee, not elsewhere classified: Secondary | ICD-10-CM

## 2017-03-06 DIAGNOSIS — R2689 Other abnormalities of gait and mobility: Secondary | ICD-10-CM | POA: Diagnosis not present

## 2017-03-06 DIAGNOSIS — M6281 Muscle weakness (generalized): Secondary | ICD-10-CM | POA: Diagnosis not present

## 2017-03-06 DIAGNOSIS — G8929 Other chronic pain: Secondary | ICD-10-CM | POA: Diagnosis not present

## 2017-03-06 DIAGNOSIS — M25562 Pain in left knee: Secondary | ICD-10-CM | POA: Diagnosis not present

## 2017-03-06 NOTE — Therapy (Signed)
[facility] [address] [place], [place], [address] Phone: [phone]   Fax:  [phone]  Physical Therapy Treatment  Patient Details  Name: Kimberly [name] MRN: [id number] Date of Birth: 25-Jul-1950 Referring Provider: [name], MD  Encounter Date: 03/06/2017      PT End of Session - 03/06/17 0922    Visit Number 32   Number of Visits 40   Date for PT Re-Evaluation 03/11/17   PT Start Time 0840   PT Stop Time 0935   PT Time Calculation (min) 55 min   Activity Tolerance Patient tolerated treatment well   Behavior During Therapy [facility] for tasks assessed/performed      Past Medical History:  Diagnosis Date  . Anemia 2005   hb 11.5  . Arthritis   . DVT (deep venous thrombosis) ([place])    2003, coumadin x 6 mon  . GERD (gastroesophageal reflux disease) 2001   no problem now  . Hypertension   . Hypothyroidism    off meds  . Neuromuscular disorder ([place])    painful in L foot, being started on Gabapentin- 11/08/2016  . Osteopenia   . Vegetarian diet    eats fish and eggs; no meat    Past Surgical History:  Procedure Laterality Date  . JOINT REPLACEMENT    . KNEE ARTHROSCOPY Right 03/16/2003  . OOPHORECTOMY     "think it was my left"  . TOTAL KNEE ARTHROPLASTY Right 09/14/2014   Procedure: RIGHT TOTAL KNEE ARTHROPLASTY;  Surgeon: Garald Balding, MD;  Location: [place];  Service: Orthopedics;  Laterality: Right;  . TOTAL KNEE ARTHROPLASTY Left 11/20/2016  . TOTAL KNEE ARTHROPLASTY Left 11/20/2016   Procedure: LEFT TOTAL KNEE ARTHROPLASTY;  Surgeon: Garald Balding, MD;  Location: [place];  Service: Orthopedics;  Laterality: Left;    There were no vitals filed for this visit.      Subjective Assessment - 03/06/17 0846    Subjective I am feeling better and recovering from illness.     Currently in Pain? No/denies                         [facility] Adult PT Treatment/Exercise - 03/06/17  0001      Knee/Hip Exercises: Aerobic   Stationary Bike --   Nustep L4 x 10 min     Knee/Hip Exercises: Machines for Strengthening   Cybex Knee Flexion Bil 25# 2x15; single leg 15# 2x10  VC to not extend her back   Total Gym Leg Press seat 6, 85# 3x10 bil, Lt only 44# 2x10     Knee/Hip Exercises: Standing   Walking with Sports Cord 25# forward/backward 10x, sidestepping 5x 20#     Knee/Hip Exercises: Supine   Bridges Limitations 2x10 with 5 second hold   Bridges with Clamshell --  Green band     Knee/Hip Exercises: Sidelying   Hip ABduction Strengthening;Left;1 set;10 reps     Vasopneumatic   Number Minutes Vasopneumatic  15 minutes   Vasopnuematic Location  Knee   Vasopneumatic Pressure Medium   Vasopneumatic Temperature  3 flakes  Game ready was also done on previous visit despite notation                  PT Short Term Goals - 03/01/17 0915      PT SHORT TERM GOAL #4   Title improve strength and endurance to stand and walk > or = to 25 minutes  to improve community ambulation   Baseline 15 minutes   Period Weeks   Status On-going  15 min           PT Long Term Goals - 02/11/17 0856      PT LONG TERM GOAL #1   Title be independent in advanced HEP   Time 4   Period Weeks   Status On-going     PT LONG TERM GOAL #2   Title reduce FOTO to < or = to 42% limitation   Baseline 40% limitation   Status Achieved     PT LONG TERM GOAL #3   Title demonstrate Lt knee A/ROM flexion to > or = to 115 degrees to allow for squatting and descending steps without substitution   Baseline 100 degrees   Time 4   Period Weeks   Status On-going     PT LONG TERM GOAL #4   Title improve Lt knee strength and flexibility to ascend and descend steps with step-over-step gait with use of 1 rail   Baseline alternating with ascending at times, step-to with descending due to pain   Time 4   Period Weeks   Status On-going     PT LONG TERM GOAL #5   Title demonstrate  5/5 Lt knee strength with good quad set to improve safety and endurance in the community   Baseline 4+/5 Lt quad and hamstring strength   Time 4   Period Weeks   Status On-going     PT LONG TERM GOAL #6   Title improve strength and endurance to ambulate for 30-45 minutes without limitation   Baseline 20 minutes due to fatigue   Time 4   Period Weeks   Status On-going               Plan - 03/06/17 0848    Clinical Impression Statement Pt has had limited endurance over the past week due to illness.  Pt has been doing strength exercises in the bed while resting.  Pt tolerated all exercise in the clinic today with supervision to monitor for fatigue.  Pt will attend 2 more sessions to advance strength and finalize HEP.     PT Frequency 3x / week   PT Duration 4 weeks   PT Treatment/Interventions Cryotherapy;ADLs/Self Care Home Management;Functional mobility training;Stair training;Gait training;Ultrasound;Moist Heat;Therapeutic activities;Therapeutic exercise;Balance training;Neuromuscular re-education;Manual techniques;Vasopneumatic Device;Taping   PT Next Visit Plan flexion ROM, LE strength, edema management, endurance, measure knee FRiday.  Work on getting up off of the floor   Consulted and Agree with Plan of Care Patient      Patient will benefit from skilled therapeutic intervention in order to improve the following deficits and impairments:  Abnormal gait, Decreased range of motion, Pain, Increased edema, Decreased strength, Decreased scar mobility, Impaired flexibility, Decreased activity tolerance, Decreased endurance  Visit Diagnosis: Acute pain of left knee  Localized edema  Other abnormalities of gait and mobility  Stiffness of left knee, not elsewhere classified  Muscle weakness (generalized)     Problem List Patient Active Problem List   Diagnosis Date Noted  . S/P total knee replacement using cement, left 11/20/2016  . Osteopenia 02/28/2015  . Left  lumbar radiculitis 02/28/2015  . Acute blood loss anemia 09/20/2014  . Primary osteoarthritis of right knee 09/14/2014  . Osteoarthritis of left knee 09/14/2014  . Vitamin B 12 deficiency 07/29/2014  . Obesity (BMI 30-39.9) 12/08/2013  . Osteoarthritis, knee 05/30/2012  . Metabolic syndrome 14/48/1856  . DVT,  HX OF 02/10/2010  . Vitamin D deficiency 08/25/2007  . Disorder of bone and cartilage 04/08/2007  . Hypothyroidism 11/18/2006  . ANEMIA-NOS 11/18/2006  . Essential hypertension 11/18/2006  . GERD 11/18/2006     Sigurd Sos, PT 03/06/17 9:25 AM  [facility] [address], [place], [place], [address] Phone: [phone]   Fax:  [phone]  Name: Kimberly [name] MRN: [id number] Date of Birth: 10/19/1950

## 2017-03-08 ENCOUNTER — Encounter: Payer: Self-pay | Admitting: Physical Therapy

## 2017-03-08 ENCOUNTER — Ambulatory Visit: Payer: Medicare Other | Admitting: Physical Therapy

## 2017-03-08 DIAGNOSIS — R600 Localized edema: Secondary | ICD-10-CM | POA: Diagnosis not present

## 2017-03-08 DIAGNOSIS — R2689 Other abnormalities of gait and mobility: Secondary | ICD-10-CM | POA: Diagnosis not present

## 2017-03-08 DIAGNOSIS — M6281 Muscle weakness (generalized): Secondary | ICD-10-CM

## 2017-03-08 DIAGNOSIS — M25662 Stiffness of left knee, not elsewhere classified: Secondary | ICD-10-CM

## 2017-03-08 DIAGNOSIS — M25562 Pain in left knee: Secondary | ICD-10-CM | POA: Diagnosis not present

## 2017-03-08 DIAGNOSIS — G8929 Other chronic pain: Secondary | ICD-10-CM | POA: Diagnosis not present

## 2017-03-08 NOTE — Therapy (Signed)
[facility] [address] [place], [place], [address] Phone: [phone]   Fax:  [phone]  Physical Therapy Treatment  Patient Details  Name: Kimberly [name] MRN: [id number] Date of Birth: [birth year] Referring Provider: [name], MD  Encounter Date: 03/08/2017      PT End of Session - 03/08/17 0846    Visit Number 33   Number of Visits 40   Date for PT Re-Evaluation 03/11/17   PT Start Time 0842   PT Stop Time 0950   PT Time Calculation (min) 68 min   Activity Tolerance Patient tolerated treatment well   Behavior During Therapy [facility] for tasks assessed/performed      Past Medical History:  Diagnosis Date  . Anemia 2005   hb 11.5  . Arthritis   . DVT (deep venous thrombosis) ([place])    2003, coumadin x 6 mon  . GERD (gastroesophageal reflux disease) 2001   no problem now  . Hypertension   . Hypothyroidism    off meds  . Neuromuscular disorder ([place])    painful in L foot, being started on Gabapentin- 11/08/2016  . Osteopenia   . Vegetarian diet    eats fish and eggs; no meat    Past Surgical History:  Procedure Laterality Date  . JOINT REPLACEMENT    . KNEE ARTHROSCOPY Right 03/16/2003  . OOPHORECTOMY     "think it was my left"  . TOTAL KNEE ARTHROPLASTY Right 09/14/2014   Procedure: RIGHT TOTAL KNEE ARTHROPLASTY;  Surgeon: Garald Balding, MD;  Location: [place];  Service: Orthopedics;  Laterality: Right;  . TOTAL KNEE ARTHROPLASTY Left 11/20/2016  . TOTAL KNEE ARTHROPLASTY Left 11/20/2016   Procedure: LEFT TOTAL KNEE ARTHROPLASTY;  Surgeon: Garald Balding, MD;  Location: [place];  Service: Orthopedics;  Laterality: Left;    There were no vitals filed for this visit.      Subjective Assessment - 03/08/17 0849    Subjective My LT ankle is hurting.   Currently in Pain? Yes  LT ankle sore , worse with weightbearing, better with Advil.   Multiple Pain Sites No            [facility] PT  Assessment - 03/08/17 0001      AROM   Left Knee Flexion 109  post session                     [facility] Adult PT Treatment/Exercise - 03/08/17 0001      Knee/Hip Exercises: Stretches   Knee: Self-Stretch to increase Flexion --  At second step 10x, then 10x with 10 sec hold at end range     Knee/Hip Exercises: Aerobic   Stationary Bike L1 x 10 min  Educated pt on more concentration to not hike at pelvis   Nustep L4 x 10 min     Knee/Hip Exercises: Machines for Strengthening   Cybex Knee Flexion Bil 25# 2x15; single leg 15# 2x10  VC to not extend her back   Total Gym Leg Press seat 6, 85# 3x10 bil, Lt only 44# 2x10     Knee/Hip Exercises: Standing   Forward Step Up Left;2 sets;15 reps;Hand Hold: 1;Step Height: 6"   Rebounder weight shifting 1 min 3 ways     Knee/Hip Exercises: Supine   Bridges Limitations 2x10 with 5 second hold  VC for Lt foot neutral     Vasopneumatic   Number Minutes Vasopneumatic  15 minutes   Vasopnuematic Location  Knee   Vasopneumatic Pressure Medium   Vasopneumatic Temperature  3 flakes  Game ready was also done on previous visit despite notation                  PT Short Term Goals - 03/01/17 0915      PT SHORT TERM GOAL #4   Title improve strength and endurance to stand and walk > or = to 25 minutes to improve community ambulation   Baseline 15 minutes   Period Weeks   Status On-going  15 min           PT Long Term Goals - 02/11/17 0856      PT LONG TERM GOAL #1   Title be independent in advanced HEP   Time 4   Period Weeks   Status On-going     PT LONG TERM GOAL #2   Title reduce FOTO to < or = to 42% limitation   Baseline 40% limitation   Status Achieved     PT LONG TERM GOAL #3   Title demonstrate Lt knee A/ROM flexion to > or = to 115 degrees to allow for squatting and descending steps without substitution   Baseline 100 degrees   Time 4   Period Weeks   Status On-going     PT LONG TERM GOAL #4    Title improve Lt knee strength and flexibility to ascend and descend steps with step-over-step gait with use of 1 rail   Baseline alternating with ascending at times, step-to with descending due to pain   Time 4   Period Weeks   Status On-going     PT LONG TERM GOAL #5   Title demonstrate 5/5 Lt knee strength with good quad set to improve safety and endurance in the community   Baseline 4+/5 Lt quad and hamstring strength   Time 4   Period Weeks   Status On-going     PT LONG TERM GOAL #6   Title improve strength and endurance to ambulate for 30-45 minutes without limitation   Baseline 20 minutes due to fatigue   Time 4   Period Weeks   Status On-going               Plan - 03/08/17 0846    Clinical Impression Statement AROm for knee flexion improved today with measurement. Lt ankle giving pt some problems that she plans on speaking with the MD about. Pt stil lhas weakness going down stairs which she reports she will work onm more at home.    Rehab Potential Good   PT Frequency 3x / week   PT Duration 4 weeks   PT Treatment/Interventions Cryotherapy;ADLs/Self Care Home Management;Functional mobility training;Stair training;Gait training;Ultrasound;Moist Heat;Therapeutic activities;Therapeutic exercise;Balance training;Neuromuscular re-education;Manual techniques;Vasopneumatic Device;Taping   PT Next Visit Plan Discharge next visit per POC   Consulted and Agree with Plan of Care Patient      Patient will benefit from skilled therapeutic intervention in order to improve the following deficits and impairments:  Abnormal gait, Decreased range of motion, Pain, Increased edema, Decreased strength, Decreased scar mobility, Impaired flexibility, Decreased activity tolerance, Decreased endurance  Visit Diagnosis: Acute pain of left knee  Localized edema  Other abnormalities of gait and mobility  Stiffness of left knee, not elsewhere classified  Muscle weakness  (generalized)     Problem List Patient Active Problem List   Diagnosis Date Noted  . S/P total knee replacement using cement, left 11/20/2016  . Osteopenia 02/28/2015  .  Left lumbar radiculitis 02/28/2015  . Acute blood loss anemia 09/20/2014  . Primary osteoarthritis of right knee 09/14/2014  . Osteoarthritis of left knee 09/14/2014  . Vitamin B 12 deficiency 07/29/2014  . Obesity (BMI 30-39.9) 12/08/2013  . Osteoarthritis, knee 05/30/2012  . Metabolic syndrome 91/50/4136  . DVT, HX OF 02/10/2010  . Vitamin D deficiency 08/25/2007  . Disorder of bone and cartilage 04/08/2007  . Hypothyroidism 11/18/2006  . ANEMIA-NOS 11/18/2006  . Essential hypertension 11/18/2006  . GERD 11/18/2006    Manford Sprong, PTA 03/08/2017, 9:36 AM  [facility] [address], [place], [place], [address] Phone: [phone]   Fax:  [phone]  Name: Kimberly [name] MRN: [id number] Date of Birth: 1950-06-17

## 2017-03-10 DIAGNOSIS — Z23 Encounter for immunization: Secondary | ICD-10-CM | POA: Diagnosis not present

## 2017-03-11 ENCOUNTER — Ambulatory Visit: Payer: Medicare Other

## 2017-03-11 ENCOUNTER — Telehealth: Payer: Self-pay | Admitting: Family Medicine

## 2017-03-11 DIAGNOSIS — R2689 Other abnormalities of gait and mobility: Secondary | ICD-10-CM | POA: Diagnosis not present

## 2017-03-11 DIAGNOSIS — G8929 Other chronic pain: Secondary | ICD-10-CM | POA: Diagnosis not present

## 2017-03-11 DIAGNOSIS — M25562 Pain in left knee: Secondary | ICD-10-CM

## 2017-03-11 DIAGNOSIS — M25662 Stiffness of left knee, not elsewhere classified: Secondary | ICD-10-CM | POA: Diagnosis not present

## 2017-03-11 DIAGNOSIS — M6281 Muscle weakness (generalized): Secondary | ICD-10-CM

## 2017-03-11 DIAGNOSIS — R600 Localized edema: Secondary | ICD-10-CM | POA: Diagnosis not present

## 2017-03-11 NOTE — Telephone Encounter (Signed)
Pt is calling and needs HEP A injection only. Can I sch?

## 2017-03-11 NOTE — Telephone Encounter (Signed)
Pt said she check with her insurance company and its covered. Pt sch for 03-13-17

## 2017-03-11 NOTE — Therapy (Signed)
[facility] [address], [place], [place], [address] Phone: [phone]   Fax:  [phone]  Physical Therapy Treatment  Patient Details  Name: Kimberly [name] MRN: [id number] Date of Birth: [birth year] Referring Provider: [name], MD  Encounter Date: 03/11/2017      PT End of Session - 03/11/17 0915    Visit Number 37   PT Start Time 0845   PT Stop Time 0945   PT Time Calculation (min) 60 min   Activity Tolerance Patient tolerated treatment well   Behavior During Therapy [facility] for tasks assessed/performed      Past Medical History:  Diagnosis Date  . Anemia 2005   hb 11.5  . Arthritis   . DVT (deep venous thrombosis) ([place])    2003, coumadin x 6 mon  . GERD (gastroesophageal reflux disease) 2001   no problem now  . Hypertension   . Hypothyroidism    off meds  . Neuromuscular disorder ([place])    painful in L foot, being started on Gabapentin- 11/08/2016  . Osteopenia   . Vegetarian diet    eats fish and eggs; no meat    Past Surgical History:  Procedure Laterality Date  . JOINT REPLACEMENT    . KNEE ARTHROSCOPY Right 03/16/2003  . OOPHORECTOMY     "think it was my left"  . TOTAL KNEE ARTHROPLASTY Right 09/14/2014   Procedure: RIGHT TOTAL KNEE ARTHROPLASTY;  Surgeon: Garald Balding, MD;  Location: [place];  Service: Orthopedics;  Laterality: Right;  . TOTAL KNEE ARTHROPLASTY Left 11/20/2016  . TOTAL KNEE ARTHROPLASTY Left 11/20/2016   Procedure: LEFT TOTAL KNEE ARTHROPLASTY;  Surgeon: Garald Balding, MD;  Location: [place];  Service: Orthopedics;  Laterality: Left;    There were no vitals filed for this visit.      Subjective Assessment - 03/11/17 0848    Subjective My Lt ankle is still hurting.  Lt knee is good.     Currently in Pain? No/denies  Lt ankle pain 2-3/10            [facility] PT Assessment - 03/11/17 0001      Assessment   Medical Diagnosis Lt TKA     Prior Function   Level of Independence Independent     Cognition   Overall Cognitive Status Within Functional Limits for tasks assessed     Observation/Other Assessments   Focus on Therapeutic Outcomes (FOTO)  40% limitation  CK     AROM   Left Knee Flexion 101     Strength   Left Knee Flexion 5/5   Left Knee Extension 5/5     Ambulation/Gait   Ambulation/Gait Yes   Gait Pattern Step-through pattern;Decreased stride length;Decreased hip/knee flexion - left                     [facility] Adult PT Treatment/Exercise - 03/11/17 0001      Knee/Hip Exercises: Aerobic   Stationary Bike L1 x 10 min  Educated pt on more concentration to not hike at pelvis   Nustep L4 x 10 min     Knee/Hip Exercises: Machines for Strengthening   Cybex Knee Flexion Bil 25# 2x15; single leg 15# 2x10  VC to not extend her back   Total Gym Leg Press seat 6, 85# 3x10 bil, Lt only 44# 2x10     Knee/Hip Exercises: Standing   Forward Step Up --   Rebounder weight shifting 1 min 3 ways  Walking with Sports Cord 25# forward/backward 10x, sidestepping 5x 20#     Vasopneumatic   Number Minutes Vasopneumatic  15 minutes   Vasopnuematic Location  Knee   Vasopneumatic Pressure Medium   Vasopneumatic Temperature  3 flakes  Game ready was also done on previous visit despite notation                  PT Short Term Goals - 03/01/17 0915      PT SHORT TERM GOAL #4   Title improve strength and endurance to stand and walk > or = to 25 minutes to improve community ambulation   Baseline 15 minutes   Period Weeks   Status On-going  15 min           PT Long Term Goals - 03/11/17 6712      PT LONG TERM GOAL #1   Title be independent in advanced HEP     PT LONG TERM GOAL #2   Title reduce FOTO to < or = to 42% limitation   Status Achieved     PT LONG TERM GOAL #3   Title demonstrate Lt knee A/ROM flexion to > or = to 115 degrees to allow for squatting and descending steps without substitution    Status Partially Met     PT LONG TERM GOAL #4   Title improve Lt knee strength and flexibility to ascend and descend steps with step-over-step gait with use of 1 rail   Baseline limited by LT ankle pain at this time   Status Partially Met     PT LONG TERM GOAL #5   Title demonstrate 5/5 Lt knee strength with good quad set to improve safety and endurance in the community   Status Achieved     PT LONG TERM GOAL #6   Title improve strength and endurance to ambulate for 30-45 minutes without limitation   Status Achieved               Plan - 03/11/17 0859    Clinical Impression Statement Pt will D/C to HEP today.  Pt with improved and still limited A/ROM in the Lt knee.  Pt able to walk for 30 minutes in the community.  Stairs are limited due to Lt ankle pain at this time.  Pt has HEP in place to address remaining deificits and will follow-up with MD as needed.     PT Next Visit Plan D/C PT to HEP   Consulted and Agree with Plan of Care Patient      Patient will benefit from skilled therapeutic intervention in order to improve the following deficits and impairments:     Visit Diagnosis: Acute pain of left knee  Localized edema  Other abnormalities of gait and mobility  Stiffness of left knee, not elsewhere classified  Muscle weakness (generalized)     Problem List Patient Active Problem List   Diagnosis Date Noted  . S/P total knee replacement using cement, left 11/20/2016  . Osteopenia 02/28/2015  . Left lumbar radiculitis 02/28/2015  . Acute blood loss anemia 09/20/2014  . Primary osteoarthritis of right knee 09/14/2014  . Osteoarthritis of left knee 09/14/2014  . Vitamin B 12 deficiency 07/29/2014  . Obesity (BMI 30-39.9) 12/08/2013  . Osteoarthritis, knee 05/30/2012  . Metabolic syndrome 45/80/9983  . DVT, HX OF 02/10/2010  . Vitamin D deficiency 08/25/2007  . Disorder of bone and cartilage 04/08/2007  . Hypothyroidism 11/18/2006  . ANEMIA-NOS 11/18/2006   . Essential hypertension  11/18/2006  . GERD 11/18/2006   PHYSICAL THERAPY DISCHARGE SUMMARY  Visits from Start of Care: 34  Current functional level related to goals / functional outcomes: Pt with limited Lt knee A/ROM.  Standing and walking are limited due to Lt ankle pain and edema today.  Pt will continue with strength and A/ROM exercises for continued gains.     Remaining deficits: See above for current status.     Education / Equipment: HEP Plan: Patient agrees to discharge.  Patient goals were met. Patient is being discharged due to meeting the stated rehab goals.  ?????         Sigurd Sos, PT 03/11/17 9:20 AM  [facility] [address], [place], [place], [address] Phone: [phone]   Fax:  [phone]  Name: Kimberly [name] MRN: [id number] Date of Birth: 1951/03/11

## 2017-03-11 NOTE — Telephone Encounter (Signed)
Okay please check with insurance first.

## 2017-03-13 ENCOUNTER — Ambulatory Visit (INDEPENDENT_AMBULATORY_CARE_PROVIDER_SITE_OTHER): Payer: Medicare Other | Admitting: *Deleted

## 2017-03-13 DIAGNOSIS — Z23 Encounter for immunization: Secondary | ICD-10-CM

## 2017-03-15 ENCOUNTER — Ambulatory Visit (INDEPENDENT_AMBULATORY_CARE_PROVIDER_SITE_OTHER): Payer: Self-pay | Admitting: Orthopaedic Surgery

## 2017-03-18 ENCOUNTER — Encounter (INDEPENDENT_AMBULATORY_CARE_PROVIDER_SITE_OTHER): Payer: Self-pay | Admitting: Orthopaedic Surgery

## 2017-03-18 ENCOUNTER — Ambulatory Visit (INDEPENDENT_AMBULATORY_CARE_PROVIDER_SITE_OTHER): Payer: Medicare Other | Admitting: Orthopaedic Surgery

## 2017-03-18 VITALS — BP 131/62 | HR 95 | Resp 14 | Ht 64.0 in | Wt 175.0 lb

## 2017-03-18 DIAGNOSIS — Z96652 Presence of left artificial knee joint: Secondary | ICD-10-CM | POA: Diagnosis not present

## 2017-03-18 DIAGNOSIS — M79672 Pain in left foot: Secondary | ICD-10-CM | POA: Diagnosis not present

## 2017-03-18 MED ORDER — METHYLPREDNISOLONE ACETATE 40 MG/ML IJ SUSP
40.0000 mg | INTRAMUSCULAR | Status: AC | PRN
  Administered 2017-03-18: 40 mg via INTRA_ARTICULAR

## 2017-03-18 MED ORDER — LIDOCAINE HCL 1 % IJ SOLN
2.0000 mL | INTRAMUSCULAR | Status: AC | PRN
  Administered 2017-03-18: 2 mL

## 2017-03-18 MED ORDER — DICLOFENAC SODIUM 1 % TD GEL: 4.0000 g | Freq: Three times a day (TID) | TRANSDERMAL | Status: DC | PRN

## 2017-03-18 MED ORDER — DICLOFENAC SODIUM 1 % TD GEL: 4.0000 g | Freq: Three times a day (TID) | TRANSDERMAL | 3 refills | Status: DC | PRN

## 2017-03-18 NOTE — Progress Notes (Signed)
Office Visit Note   Patient: Kimberly [name]           Date of Birth: 24-Jul-1950           MRN: [id number] Visit Date: 03/18/2017              Requested by: Eulas Post, MD [place], [place] [address] PCP: Eulas Post, MD   Assessment & Plan: Visit Diagnoses:  1. History of left knee replacement     Plan: 4 months status post left total knee replacement. Doing well. Planning a trip to [place] for over 2 months.having issues with left ankle. Prior MRI scan demonstrates arthritis. Will inject her ankle with cortisone, prescription for Voltaren gel, comfortable shoes,return to office when necessary.many questions. Total time spent over 30 minutes.  Follow-Up Instructions: Return if symptoms worsen or fail to improve.   Orders:  No orders of the defined types were placed in this encounter.  No orders of the defined types were placed in this encounter.     Procedures: Medium Joint Inj: L ankle on 03/18/2017 2:27 PM Indications: pain Details: 25 G needle, anterolateral approach Medications: 2 mL lidocaine 1 %; 40 mg methylPREDNISolone acetate 40 MG/ML Consent was given by the patient.       Clinical Data: No additional findings.   Subjective: Chief Complaint  Patient presents with  . Left Knee - Routine Post Op  . Left Foot - Pain  4 months status post left total knee replacement. Very happy with progress. Not using any ambulatory aid. Having recurrent pain in left ankle. MRI scan in 2017 notes degenerative changes at the tibiotalar joint. Planning a trip to [place] and wants to "feel better" HPI  Review of Systems  Constitutional: Negative for chills, fatigue and fever.  Eyes: Negative for itching.  Respiratory: Negative for chest tightness and shortness of breath.   Cardiovascular: Negative for chest pain, palpitations and leg swelling.  Gastrointestinal: Negative for blood in stool, constipation and diarrhea.  Endocrine: Negative for  polyuria.  Genitourinary: Negative for dysuria.  Musculoskeletal: Negative for back pain, joint swelling, neck pain and neck stiffness.  Allergic/Immunologic: Negative for immunocompromised state.  Neurological: Negative for dizziness and numbness.  Hematological: Does not bruise/bleed easily.  Psychiatric/Behavioral: Positive for sleep disturbance. The patient is not nervous/anxious.      Objective: Vital Signs: BP 131/62   Pulse 95   Resp 14   Ht 5\' 4"  (1.626 m)   Wt 175 lb (79.4 kg)   LMP 05/14/1998   BMI 30.04 kg/m   Physical Exam  Ortho Exameft knee without increased heat or effusion. Incision healed without problems. Full quick extension probably 95-100 of flexion-similar to right knee. No pain. No instability. No swelling distally. Neurovascular exam intact. Some pain anteriorly at the ankle joint. Mild tenderness about the Achilles tendon. No swelling. No pain behind either malleoli.lateral and medial tendons function without pain. No plantar foot pain. Some pain with dorsiflexion of the ankle joint consistent with the arthritis  Specialty Comments:  No specialty comments available.  Imaging: No results found.   PMFS History: Patient Active Problem List   Diagnosis Date Noted  . S/P total knee replacement using cement, left 11/20/2016  . Osteopenia 02/28/2015  . Left lumbar radiculitis 02/28/2015  . Acute blood loss anemia 09/20/2014  . Primary osteoarthritis of right knee 09/14/2014  . Osteoarthritis of left knee 09/14/2014  . Vitamin B 12 deficiency 07/29/2014  . Obesity (BMI 30-39.9) 12/08/2013  .  Osteoarthritis, knee 05/30/2012  . Metabolic syndrome 77/82/4235  . DVT, HX OF 02/10/2010  . Vitamin D deficiency 08/25/2007  . Disorder of bone and cartilage 04/08/2007  . Hypothyroidism 11/18/2006  . ANEMIA-NOS 11/18/2006  . Essential hypertension 11/18/2006  . GERD 11/18/2006   Past Medical History:  Diagnosis Date  . Anemia 2005   hb 11.5  . Arthritis    . DVT (deep venous thrombosis) ([place])    2003, coumadin x 6 mon  . GERD (gastroesophageal reflux disease) 2001   no problem now  . Hypertension   . Hypothyroidism    off meds  . Neuromuscular disorder ([place])    painful in L foot, being started on Gabapentin- 11/08/2016  . Osteopenia   . Vegetarian diet    eats fish and eggs; no meat    Family History  Problem Relation Age of Onset  . Diabetes Mother   . Heart failure Father   . Diabetes Other   . Alcohol abuse Other     Past Surgical History:  Procedure Laterality Date  . JOINT REPLACEMENT    . KNEE ARTHROSCOPY Right 03/16/2003  . OOPHORECTOMY     "think it was my left"  . TOTAL KNEE ARTHROPLASTY Left 11/20/2016   Social History   Occupational History  . Not on file  Tobacco Use  . Smoking status: Never Smoker  . Smokeless tobacco: Never Used  Substance and Sexual Activity  . Alcohol use: No  . Drug use: No  . Sexual activity: Not on file

## 2017-03-19 ENCOUNTER — Telehealth: Payer: Self-pay

## 2017-03-19 NOTE — Telephone Encounter (Signed)
Patient called stating that her ankle is hurting very badly and would like to know if this is normal due to her getting an injection in her left ankle.  Would like to know what she needs to do?  Cb# is [phone].  Thank you.

## 2017-03-28 ENCOUNTER — Other Ambulatory Visit: Payer: Self-pay | Admitting: Family Medicine

## 2017-05-06 ENCOUNTER — Telehealth: Payer: Self-pay | Admitting: Family Medicine

## 2017-05-06 NOTE — Telephone Encounter (Signed)
Ok with me 

## 2017-05-06 NOTE — Telephone Encounter (Signed)
[place] with me.  Algis Greenhouse. Jerline Pain, MD 05/06/2017 12:56 PM

## 2017-05-06 NOTE — Telephone Encounter (Signed)
Patient would like to transfer from Dr. Elease Hashimoto to Dr. Jerline Pain due to location. Please respond with an approval or denial of transfer.

## 2017-05-09 ENCOUNTER — Ambulatory Visit: Payer: Medicare Other | Admitting: Family Medicine

## 2017-09-02 ENCOUNTER — Telehealth: Payer: Self-pay | Admitting: Family Medicine

## 2017-09-02 DIAGNOSIS — M79622 Pain in left upper arm: Secondary | ICD-10-CM

## 2017-09-02 NOTE — Telephone Encounter (Signed)
Copied from [place] [id number] [id number]. Topic: Inquiry >> Sep 02, 2017  2:26 PM Margot Ables wrote: Reason for CRM: pt is needing to schedule a mammogram. She has these done at [facility]. Patient had pain in her breast at last OV. She believes the [facility] Center needs to know if screening mammogram or diagnostic mammogram is recommended. Pt wants whichever is covered by her insurance. Pt states she sometimes has pain and sometimes she does not (under L arm pit area).

## 2017-09-02 NOTE — Telephone Encounter (Signed)
If having pain and we are considering diagnostic, recommend follow up exam first.

## 2017-09-02 NOTE — Telephone Encounter (Signed)
Please advise, pt was last seen 02/2017 and there was no mention of breast pain. Do you need to see her for an appointment first?

## 2017-09-03 ENCOUNTER — Encounter: Payer: Self-pay | Admitting: Family Medicine

## 2017-09-03 ENCOUNTER — Ambulatory Visit (INDEPENDENT_AMBULATORY_CARE_PROVIDER_SITE_OTHER): Payer: Medicare Other | Admitting: Family Medicine

## 2017-09-03 ENCOUNTER — Other Ambulatory Visit: Payer: Self-pay

## 2017-09-03 VITALS — BP 126/76 | HR 79 | Temp 98.2°F | Resp 16 | Ht 64.0 in | Wt 170.0 lb

## 2017-09-03 DIAGNOSIS — M79622 Pain in left upper arm: Secondary | ICD-10-CM

## 2017-09-03 MED ORDER — LEVOTHYROXINE SODIUM 50 MCG PO TABS: 50.0000 ug | ORAL_TABLET | Freq: Every day | ORAL | 3 refills | Status: DC

## 2017-09-03 NOTE — Telephone Encounter (Signed)
Called patient and she has made an appointment today, 09/03/17 for a follow up for left breast pain.

## 2017-09-03 NOTE — Progress Notes (Signed)
Subjective:     Patient ID: Kimberly [name], female   DOB: 10-Feb-1951, 67 y.o.   MRN: [id number]  HPI Patient called yesterday questioning whether she should get a diagnostic mammogram. She is overdue for screening. She states she's had some very intermittent pain which is really more in her pposterior axillary region and posterior arm region. No injury. No rash. No breast pain. No nipple discharge. No axillary adenopathy. Symptoms are very mild. No clear exacerbating or alleviating factors No neck pain.  Past Medical History:  Diagnosis Date  . Anemia 2005   hb 11.5  . Arthritis   . DVT (deep venous thrombosis) ([place])    2003, coumadin x 6 mon  . GERD (gastroesophageal reflux disease) 2001   no problem now  . Hypertension   . Hypothyroidism    off meds  . Neuromuscular disorder ([place])    painful in L foot, being started on Gabapentin- 11/08/2016  . Osteopenia   . Vegetarian diet    eats fish and eggs; no meat   Past Surgical History:  Procedure Laterality Date  . JOINT REPLACEMENT    . KNEE ARTHROSCOPY Right 03/16/2003  . OOPHORECTOMY     "think it was my left"  . TOTAL KNEE ARTHROPLASTY Right 09/14/2014   Procedure: RIGHT TOTAL KNEE ARTHROPLASTY;  Surgeon: Garald Balding, MD;  Location: [facility];  Service: Orthopedics;  Laterality: Right;  . TOTAL KNEE ARTHROPLASTY Left 11/20/2016  . TOTAL KNEE ARTHROPLASTY Left 11/20/2016   Procedure: LEFT TOTAL KNEE ARTHROPLASTY;  Surgeon: Garald Balding, MD;  Location: [facility];  Service: Orthopedics;  Laterality: Left;    reports that she has never smoked. She has never used smokeless tobacco. She reports that she does not drink alcohol or use drugs. family history includes Alcohol abuse in her other; Diabetes in her mother and other; Heart failure in her father. Allergies  Allergen Reactions  . Enoxaparin Sodium Itching and Swelling    SWELLING REACTION UNSPECIFIED      Review of Systems  Constitutional: Negative for chills and fever.   Skin: Negative for rash.  Hematological: Negative for adenopathy.       Objective:   Physical Exam  Constitutional: She appears well-developed and well-nourished.  Cardiovascular: Normal rate and regular rhythm.  Pulmonary/Chest: Effort normal and breath sounds normal. No respiratory distress. She has no wheezes. She has no rales.  Left breast reveals no mass and no axillary adenopathy. No nipple inversion or nipple discharge. Exam was carried out with nurse present       Assessment:     Left posterior axillary pain. Symptoms are very intermittent and mild and she has unremarkable exam today. We did not see any clear indication for diagnostic mammogram. Suspect musculoskeletal pain    Plan:     -Proceed with screening mammography -Follow-up for any persistent pain rather concerns  Eulas Post MD [facility] at [facility]

## 2017-09-06 NOTE — Telephone Encounter (Signed)
Pt had the appt with Dr Elease Hashimoto, the order for the 3 d is in the system. Pt called the breast center and they do not have the order. Can you please send to the breast center asap. Pt is eager to have this done.  Please advise

## 2017-09-06 NOTE — Telephone Encounter (Signed)
yes

## 2017-09-06 NOTE — Telephone Encounter (Signed)
Okay to order?

## 2017-09-06 NOTE — Telephone Encounter (Signed)
Paper order faxed and confirmed

## 2017-09-09 ENCOUNTER — Ambulatory Visit: Payer: Medicare Other | Admitting: Family Medicine

## 2017-09-10 NOTE — Addendum Note (Signed)
Addended by: Westley Hummer B on: 09/10/2017 03:55 PM   Modules accepted: Orders

## 2017-09-10 NOTE — Telephone Encounter (Signed)
Kimberly [name] from [facility] [phone] called about an order she received. It was for a    3 D mammogram and Dr. Elease Hashimoto said ok to do Diagnostic as pt. Is having a problem.   Please fax to Kimberly [name] [phone]

## 2017-09-10 NOTE — Telephone Encounter (Signed)
New orders placed

## 2017-09-18 ENCOUNTER — Encounter: Payer: Self-pay | Admitting: Podiatry

## 2017-09-18 ENCOUNTER — Ambulatory Visit (INDEPENDENT_AMBULATORY_CARE_PROVIDER_SITE_OTHER): Payer: Medicare Other | Admitting: Podiatry

## 2017-09-18 VITALS — BP 132/78 | HR 74 | Ht 62.0 in | Wt 162.0 lb

## 2017-09-18 DIAGNOSIS — M21962 Unspecified acquired deformity of left lower leg: Secondary | ICD-10-CM

## 2017-09-18 DIAGNOSIS — M7741 Metatarsalgia, right foot: Secondary | ICD-10-CM

## 2017-09-18 DIAGNOSIS — M25572 Pain in left ankle and joints of left foot: Secondary | ICD-10-CM

## 2017-09-18 DIAGNOSIS — M25571 Pain in right ankle and joints of right foot: Secondary | ICD-10-CM

## 2017-09-18 DIAGNOSIS — M7742 Metatarsalgia, left foot: Secondary | ICD-10-CM | POA: Diagnosis not present

## 2017-09-18 NOTE — Progress Notes (Signed)
SUBJECTIVE: 67 y.o. year old female presents accompanied by her husband for painful bunion and numbness on ball of right foot duration of off and on for several month.  Patient is referred by Dr. Elease Hashimoto.  July 2018 right knee done, May 2015 left knee done.   Review of Systems  Constitutional: Negative.   HENT: Negative.   Eyes: Negative.   Respiratory: Negative.   Cardiovascular: Negative.   Gastrointestinal: Negative.   Genitourinary: Negative.   Musculoskeletal:       Bilateral knee replacement done.  Skin: Negative.      OBJECTIVE: DERMATOLOGIC EXAMINATION: Normal findings.  VASCULAR EXAMINATION OF LOWER LIMBS: All pedal pulses are palpable with normal pulsation.  Capillary Filling times within 3 seconds in all digits.  No edema or erythema noted. Temperature gradient from tibial crest to dorsum of foot is within normal bilateral.  NEUROLOGIC EXAMINATION OF THE LOWER LIMBS: All epicritic and tactile sensations grossly intact. Sharp and Dull discriminatory sensations at the plantar ball of hallux is intact bilateral.   MUSCULOSKELETAL EXAMINATION: Positive for elevated first ray bilateral. Forefoot varus bilateral. Lesser metatarsalgia with numbness on bottom left foot.  RADIOGRAPHIC FINDINGS: AP View:  Adducted metatarsal bones with long first metatarsal on both feet.  Lateral view:  Severe first metatarsal elevation, plantar and posterior calcaneal spur, anterior advancement of CYMA line on right. Severe first metatarsal elevation with plantar and posterior calcaneal spur left.   ASSESSMENT: Metatarsus primus elevatus bilateral. Arthralgia 1st MPJ left. Lesser metatarsalgia left foot.  PLAN: Reviewed findings and available treatment options, change in shoe gear, activity, custom orthotics, and surgical options. As per request both feet casted for Orthotics.

## 2017-09-18 NOTE — Patient Instructions (Signed)
Seen for pain in left foot at the bunion area and ball of the foot. Reviewed findings, weak first metatarsal bone and lateral weight shifting. Both feet casted for Orthotics.

## 2017-09-19 ENCOUNTER — Ambulatory Visit: Payer: Medicare Other

## 2017-09-19 ENCOUNTER — Ambulatory Visit
Admission: RE | Admit: 2017-09-19 | Discharge: 2017-09-19 | Disposition: A | Discharge status: Home / Self Care | Payer: Medicare Other | Source: Ambulatory Visit | Attending: Family Medicine | Admitting: Family Medicine

## 2017-09-19 DIAGNOSIS — R928 Other abnormal and inconclusive findings on diagnostic imaging of breast: Secondary | ICD-10-CM | POA: Diagnosis not present

## 2017-09-19 DIAGNOSIS — M79622 Pain in left upper arm: Secondary | ICD-10-CM

## 2017-10-11 ENCOUNTER — Telehealth: Payer: Self-pay

## 2017-10-11 NOTE — Telephone Encounter (Signed)
Left vm advising that orthotics are ready to pick up.

## 2017-10-16 ENCOUNTER — Ambulatory Visit (INDEPENDENT_AMBULATORY_CARE_PROVIDER_SITE_OTHER): Payer: Medicare Other | Admitting: Family Medicine

## 2017-10-16 ENCOUNTER — Encounter: Payer: Self-pay | Admitting: Family Medicine

## 2017-10-16 VITALS — BP 110/70 | HR 66 | Temp 98.1°F | Wt 165.7 lb

## 2017-10-16 DIAGNOSIS — E8881 Metabolic syndrome: Secondary | ICD-10-CM

## 2017-10-16 DIAGNOSIS — H02844 Edema of left upper eyelid: Secondary | ICD-10-CM

## 2017-10-16 DIAGNOSIS — R739 Hyperglycemia, unspecified: Secondary | ICD-10-CM

## 2017-10-16 DIAGNOSIS — I10 Essential (primary) hypertension: Secondary | ICD-10-CM | POA: Diagnosis not present

## 2017-10-16 DIAGNOSIS — E039 Hypothyroidism, unspecified: Secondary | ICD-10-CM | POA: Diagnosis not present

## 2017-10-16 LAB — TSH: TSH: 2.2 u[IU]/mL (ref 0.35–4.50)

## 2017-10-16 LAB — HEMOGLOBIN A1C: HEMOGLOBIN A1C: 6.2 % (ref 4.6–6.5)

## 2017-10-16 NOTE — Patient Instructions (Signed)
Use warm compresses 2-3 times daily  May do some gentle massage or left upper lid  Touch base in one week if not resolving.

## 2017-10-16 NOTE — Progress Notes (Signed)
Subjective:     Patient ID: Kimberly [name], female   DOB: 05/07/1951, 67 y.o.   MRN: [id number]  HPI Patient seen today for the following issues  Acute issue of some left upper eyelid swelling along with some itching and mild burning past couple days. She was out walking 1 night and wondered initially if she had some type of insect in her eye. She rubbed her eye a few times. She has had some swelling upper lid since then. No drainage. No blurred vision. No crusting or matting of the eye. No right eye symptoms  Hypothyroidism. She is on levothyroxin and overdue for follow-up labs. She is compliant with therapy. She has history of prediabetes and metabolic syndrome. Positive family history of diabetes. Mother died of complications of diabetes. Patient had A1c a year ago 6.2. She does not monitor blood sugars regularly. She has done an excellent job with walking and exercise and has been recently walking 5 miles per day and has lost about 5 pounds.  Past Medical History:  Diagnosis Date  . Anemia 2005   hb 11.5  . Arthritis   . DVT (deep venous thrombosis) ([place])    2003, coumadin x 6 mon  . GERD (gastroesophageal reflux disease) 2001   no problem now  . Hypertension   . Hypothyroidism    off meds  . Neuromuscular disorder ([place])    painful in L foot, being started on Gabapentin- 11/08/2016  . Osteopenia   . Vegetarian diet    eats fish and eggs; no meat   Past Surgical History:  Procedure Laterality Date  . JOINT REPLACEMENT    . KNEE ARTHROSCOPY Right 03/16/2003  . OOPHORECTOMY     "think it was my left"  . TOTAL KNEE ARTHROPLASTY Right 09/14/2014   Procedure: RIGHT TOTAL KNEE ARTHROPLASTY;  Surgeon: Garald Balding, MD;  Location: [facility];  Service: Orthopedics;  Laterality: Right;  . TOTAL KNEE ARTHROPLASTY Left 11/20/2016  . TOTAL KNEE ARTHROPLASTY Left 11/20/2016   Procedure: LEFT TOTAL KNEE ARTHROPLASTY;  Surgeon: Garald Balding, MD;  Location: [facility];  Service: Orthopedics;   Laterality: Left;    reports that she has never smoked. She has never used smokeless tobacco. She reports that she does not drink alcohol or use drugs. family history includes Alcohol abuse in her other; Diabetes in her mother and other; Heart failure in her father. Allergies  Allergen Reactions  . Enoxaparin Sodium Itching and Swelling    SWELLING REACTION UNSPECIFIED      Review of Systems  Constitutional: Negative for fatigue.  Eyes: Positive for pain, discharge (only clear discharge) and itching. Negative for visual disturbance.  Respiratory: Negative for cough, chest tightness, shortness of breath and wheezing.   Cardiovascular: Negative for chest pain, palpitations and leg swelling.  Neurological: Negative for dizziness, seizures, syncope, weakness, light-headedness and headaches.       Objective:   Physical Exam  Constitutional: She appears well-developed and well-nourished.  Eyes: Pupils are equal, round, and reactive to light. EOM are normal.  Left upper lid is slightly swollen diffusely. There are no cellulitis changes. Lower lid is normal. Conjunctiva are normal. Pupils equal reactive to light. She has cataracts bilaterally.  fluorescein staining of left eye-no foreign bodies. No abrasions. No ulcerations.  Neck: Neck supple. No JVD present. No thyromegaly present.  Cardiovascular: Normal rate and regular rhythm. Exam reveals no gallop.  Pulmonary/Chest: Effort normal and breath sounds normal. No respiratory distress. She has no wheezes. She has  no rales.  Musculoskeletal: She exhibits no edema.  Neurological: She is alert.       Assessment:     #1 left upper eyelid swelling. Suspect duct obstruction. No peri-ocular rash or cellulitis changes  #2 hypothyroidism  #3 history of metabolic syndrome and positive family history of type 2 diabetes    Plan:     -Recheck TSH and hemoglobin A1c -We recommended warm compresses to left upper lid several times daily and  also try some gentle massage of her left upper lid -Touch base in one week if eyelid edema not resolving and sooner as needed  Eulas Post MD [facility] at [facility]

## 2017-10-18 DIAGNOSIS — H02834 Dermatochalasis of left upper eyelid: Secondary | ICD-10-CM | POA: Diagnosis not present

## 2017-10-18 DIAGNOSIS — H02831 Dermatochalasis of right upper eyelid: Secondary | ICD-10-CM | POA: Diagnosis not present

## 2017-12-02 ENCOUNTER — Ambulatory Visit: Payer: Medicare Other | Admitting: Family Medicine

## 2018-02-03 DIAGNOSIS — H04562 Stenosis of left lacrimal punctum: Secondary | ICD-10-CM | POA: Diagnosis not present

## 2018-02-18 DIAGNOSIS — H2513 Age-related nuclear cataract, bilateral: Secondary | ICD-10-CM | POA: Diagnosis not present

## 2018-03-14 ENCOUNTER — Telehealth (INDEPENDENT_AMBULATORY_CARE_PROVIDER_SITE_OTHER): Payer: Self-pay | Admitting: Orthopaedic Surgery

## 2018-03-14 ENCOUNTER — Other Ambulatory Visit (INDEPENDENT_AMBULATORY_CARE_PROVIDER_SITE_OTHER): Payer: Self-pay | Admitting: Radiology

## 2018-03-14 MED ORDER — AMOXICILLIN 500 MG PO CAPS: ORAL_CAPSULE | ORAL | 1 refills | Status: DC

## 2018-03-14 NOTE — Telephone Encounter (Signed)
Please call in same antibiotic that we previously used

## 2018-03-14 NOTE — Telephone Encounter (Signed)
[place], and needs an antibiotic called in for her. Patient states she needs a larger quantity because she is [place] for [place] procedure. Patient uses [facility]. Please call patient to advise.

## 2018-03-14 NOTE — Telephone Encounter (Signed)
How much would you like me to call in?

## 2018-03-14 NOTE — Telephone Encounter (Signed)
Called in antibiotics and notified pt

## 2018-04-09 DIAGNOSIS — Z23 Encounter for immunization: Secondary | ICD-10-CM | POA: Diagnosis not present

## 2018-05-22 ENCOUNTER — Other Ambulatory Visit: Payer: Self-pay | Admitting: Family Medicine

## 2018-06-05 ENCOUNTER — Other Ambulatory Visit: Payer: Self-pay | Admitting: Family Medicine

## 2018-06-11 ENCOUNTER — Other Ambulatory Visit: Payer: Self-pay

## 2018-06-11 MED ORDER — IRBESARTAN 150 MG PO TABS: 150.0000 mg | ORAL_TABLET | Freq: Every day | ORAL | 0 refills | Status: DC

## 2018-06-14 IMAGING — MR MR ANKLE*L* W/O CM
4 of 5 series · 27 of 40 positions shown · non-contrast
Comparison: 12/21/2015 radiographs.

CLINICAL DATA: Left ankle pain for 1 year.  Weakness.  talar cyst.

EXAM:
MRI OF THE LEFT ANKLE WITHOUT CONTRAST
TECHNIQUE: Multiplanar, multisequence MR imaging of the ankle was performed. No
intravenous contrast was administered.

[Series 5: T2 fat-sat · coronal · 4.0mm · 0.33mm/px · 9 of 24 slices shown (1 of 3)]
[im 1/24]
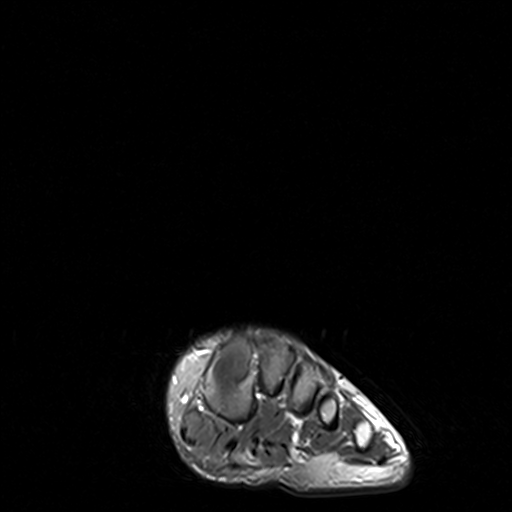
[im 3/24]
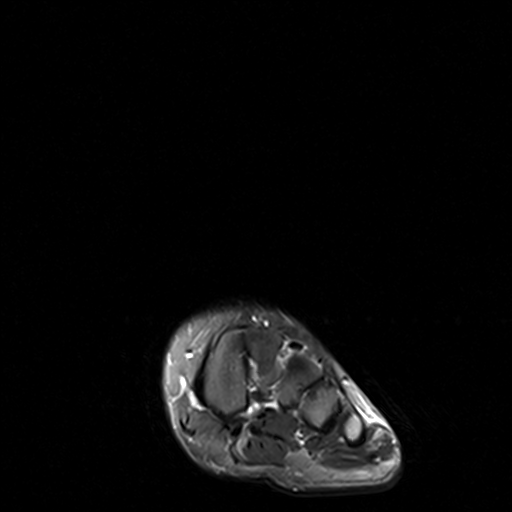
[im 6/24]
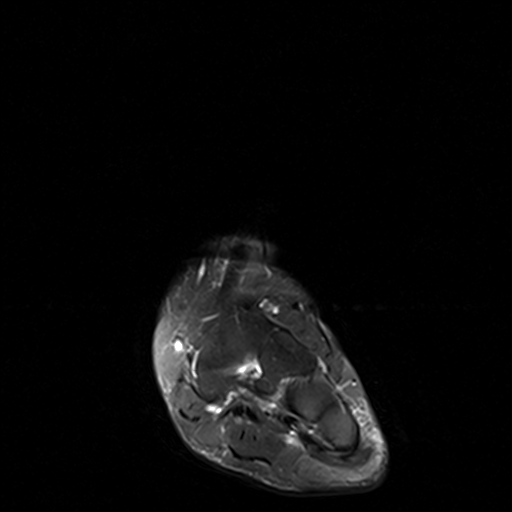
[im 9/24]
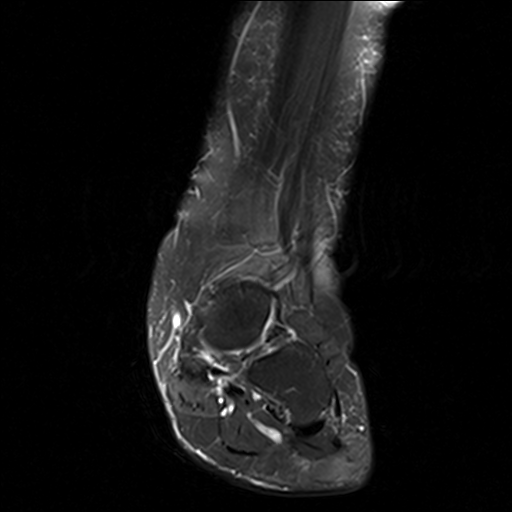
[im 12/24]
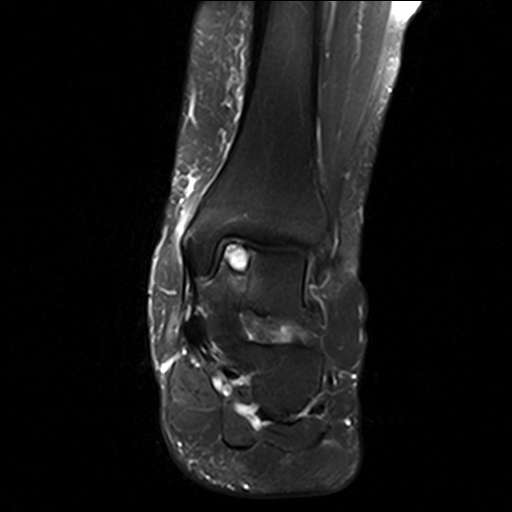
[im 15/24]
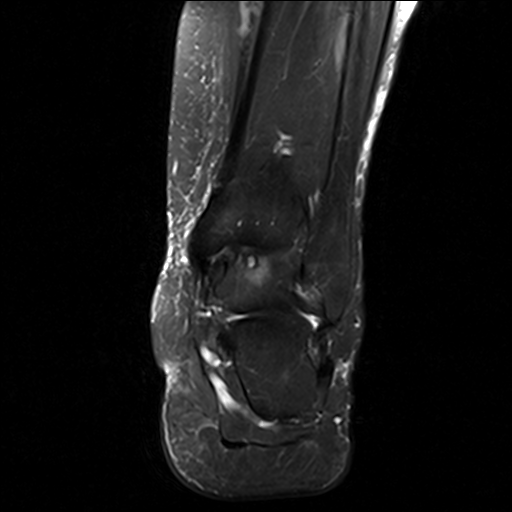
[im 18/24]
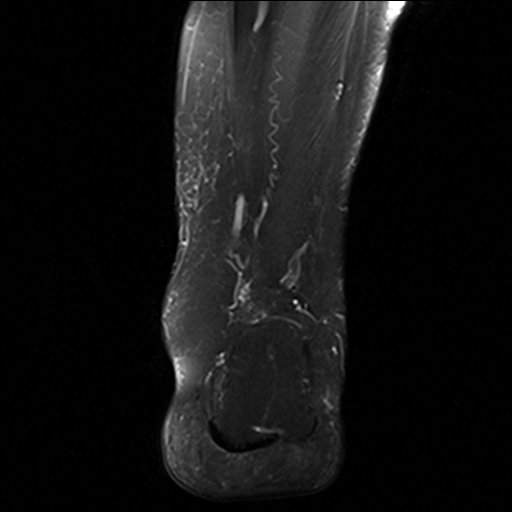
[im 21/24]
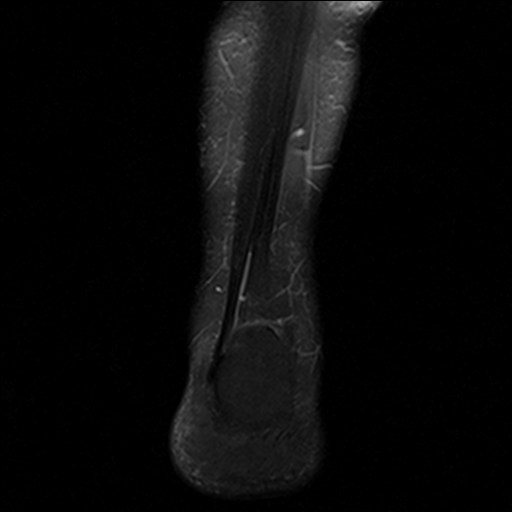
[im 24/24]
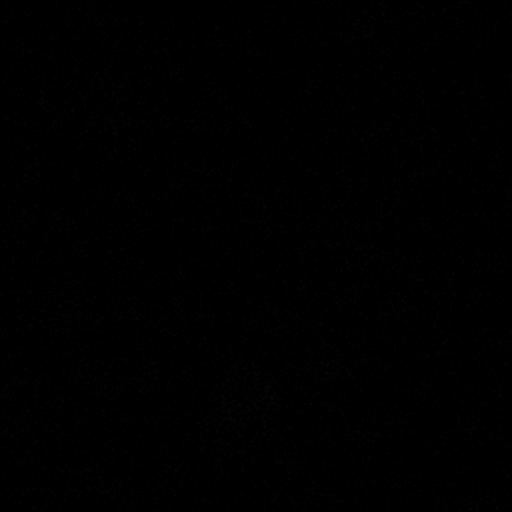

[Series 6: T2 fat-sat · sagittal · 3.0mm · 0.33mm/px · 6 of 19 slices shown (2 of 3)]
[im 1/19]
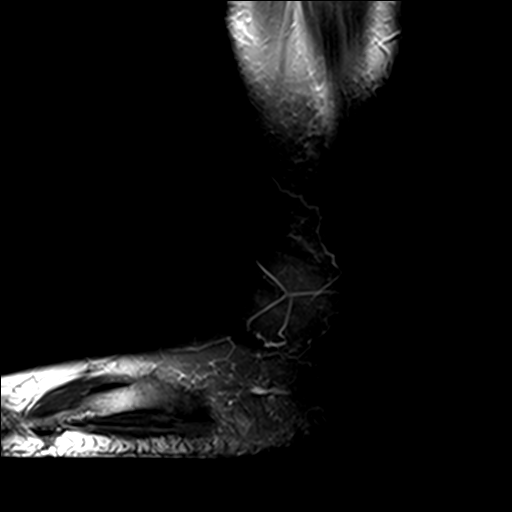
[im 4/19]
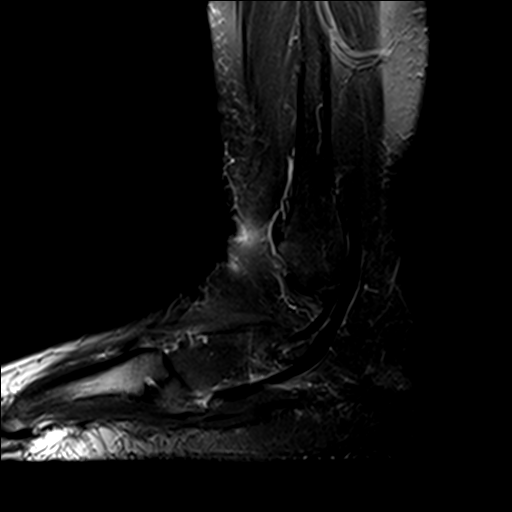
[im 7/19]
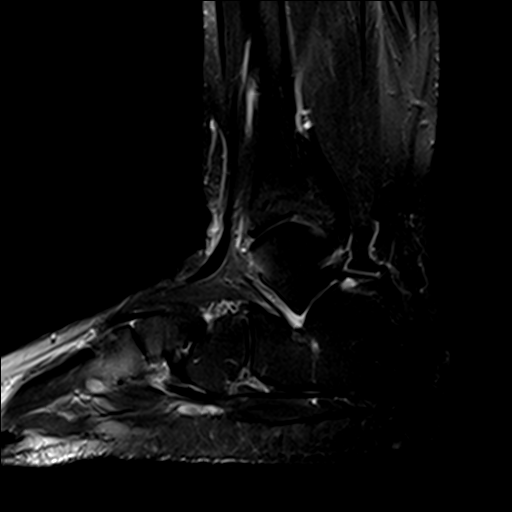
[im 10/19]
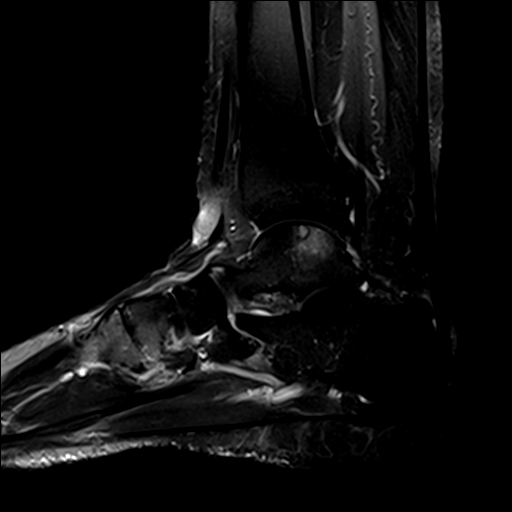
[im 13/19]
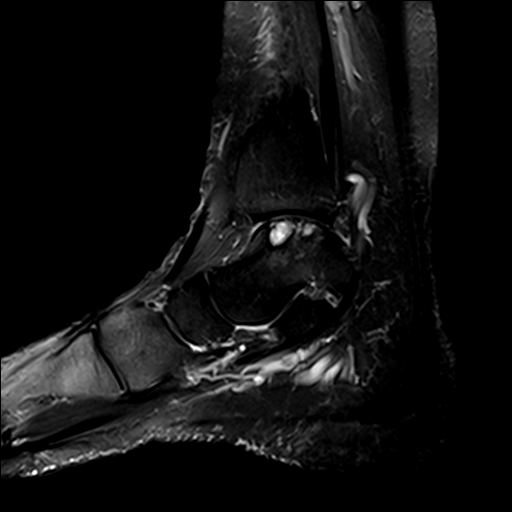
[im 16/19]
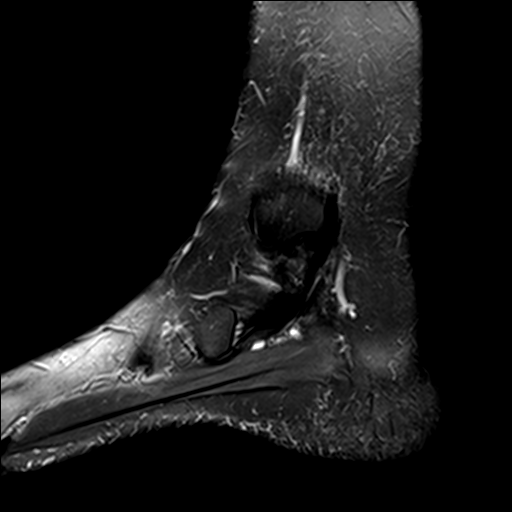

[Series 7: T2 fat-sat · axial · 4.0mm · 0.53mm/px · z∈[-65,+24]mm · 3 of 24 slices shown (3 of 3)]
[im 3/24]
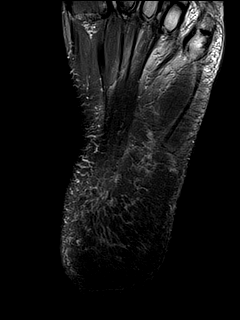
[im 12/24]
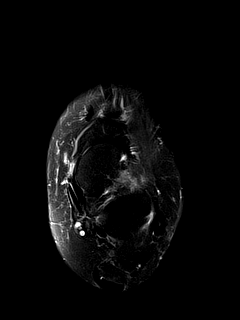
[im 21/24]
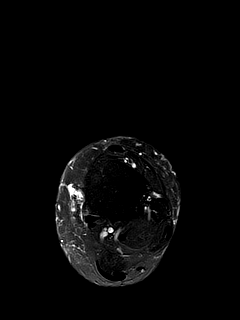

[Series 8: PD fat-sat · axial · 4.0mm · 0.53mm/px · z∈[-75,+39]mm · 9 of 24 slices shown]
[im 1/24]
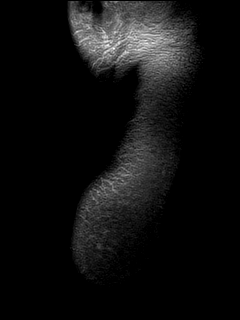
[im 3/24]
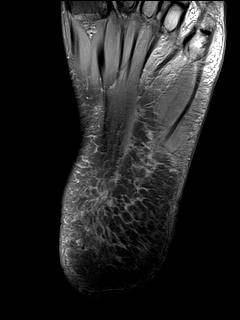
[im 6/24]
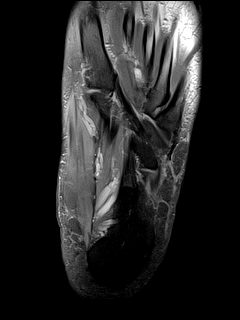
[im 9/24]
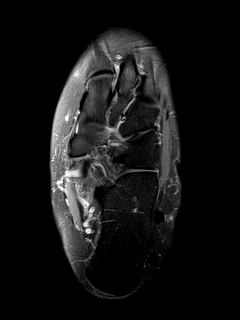
[im 12/24]
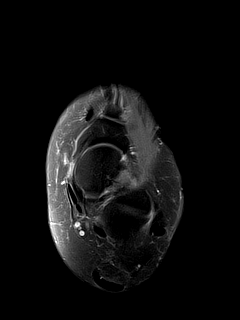
[im 15/24]
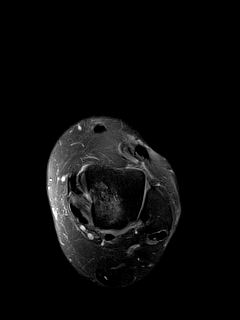
[im 18/24]
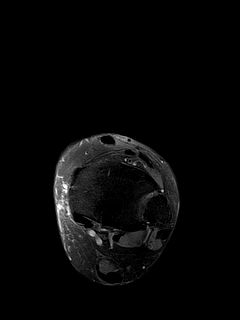
[im 21/24]
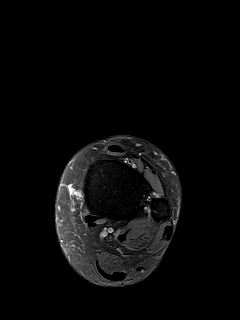
[im 24/24]
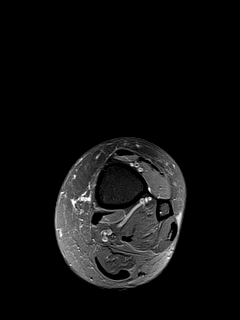

[27 of 40 positions shown; findings below may reference images not displayed]

FINDINGS: TENDONS

Peroneal: Unremarkable

Posteromedial: Mild distal tibialis posterior tendinopathy.

Anterior: Unremarkable

Achilles: Minimal distal Achilles tendinopathy, images 9-13 series
7.

Plantar Fascia: Unremarkable

LIGAMENTS

Lateral: Thickened anterior inferior tibiofibular ligament.

Medial: Unremarkable

CARTILAGE

Ankle Joint: 2.0 by 1.4 by 0.9 cm confluence of subcortical cystic
lesions with overlying mild cortical irregularity and surrounding
marrow edema noted along the medial talar dome corresponding to the
radiographic abnormality. Tibial plafond unremarkable. Moderate
chondral thinning in the tibiotalar joint.

Subtalar Joints/Sinus Tarsi: Unremarkable

Bones: No significant extra-articular osseous abnormalities
identified.

Other: No supplemental non-categorized findings.
IMPRESSION: 1. Confluence of degenerative subcortical cystic lesions in the
medial talar dome with some overlying articular cartilage
irregularity and chondral thinning, and surrounding marrow edema.
Although likely degenerative, the possibility of 1 or more of the
cystic lesions representing geodes is raised as a possibility. No
current fragmentation. No tibiotalar joint effusion.
2. Mild distal tibialis posterior tendinopathy, correlate clinically
in assessing for tibialis posterior dysfunction.
3. Minimal distal Achilles tendinopathy.
4. Thickened anterior inferior tibiofibular ligament, but not
discontinuous, query remote injury. The lower margin of the
syndesmosis appears intact on image [DATE].

## 2018-07-25 DIAGNOSIS — H40013 Open angle with borderline findings, low risk, bilateral: Secondary | ICD-10-CM | POA: Diagnosis not present

## 2018-09-09 ENCOUNTER — Other Ambulatory Visit: Payer: Self-pay | Admitting: Family Medicine

## 2018-09-17 DIAGNOSIS — L290 Pruritus ani: Secondary | ICD-10-CM | POA: Diagnosis not present

## 2018-10-15 ENCOUNTER — Ambulatory Visit (INDEPENDENT_AMBULATORY_CARE_PROVIDER_SITE_OTHER): Payer: Medicare Other | Admitting: Family Medicine

## 2018-10-15 ENCOUNTER — Other Ambulatory Visit: Payer: Self-pay

## 2018-10-15 DIAGNOSIS — E538 Deficiency of other specified B group vitamins: Secondary | ICD-10-CM

## 2018-10-15 DIAGNOSIS — R739 Hyperglycemia, unspecified: Secondary | ICD-10-CM | POA: Diagnosis not present

## 2018-10-15 DIAGNOSIS — I10 Essential (primary) hypertension: Secondary | ICD-10-CM

## 2018-10-15 DIAGNOSIS — M79672 Pain in left foot: Secondary | ICD-10-CM | POA: Diagnosis not present

## 2018-10-15 DIAGNOSIS — E039 Hypothyroidism, unspecified: Secondary | ICD-10-CM

## 2018-10-15 NOTE — Progress Notes (Signed)
Patient ID: Kimberly [name], female   DOB: 07/04/1950, 68 y.o.   MRN: [id number]  This visit type was conducted due to national recommendations for restrictions regarding the COVID-19 pandemic in an effort to limit this patient's exposure and mitigate transmission in our community.   Virtual Visit via Telephone Note  I connected with [name] on 10/15/18 at 11:00 AM EDT by telephone and verified that I am speaking with the correct person using two identifiers.   I discussed the limitations, risks, security and privacy concerns of performing an evaluation and management service by telephone and the availability of in person appointments. I also discussed with the patient that there may be a patient responsible charge related to this service. The patient expressed understanding and agreed to proceed.  Location patient: home Location provider: work or home office Participants present for the call: patient, provider Patient did not have a visit in the prior 7 days to address this/these issue(s).   History of Present Illness:  Patient had difficulty getting video component to work so we converted this to a phone note.  We discussed several items as follows  Left foot pain.  This has been a recurrent problem.  She describes burning sensation which is somewhat diffuse mostly in the evenings.  Denies any injury.  She saw podiatrist last year and had insert and that has not helped much recently.  She denies any swelling.  She does have history of hyperglycemia.  Last A1c was 6.2%.  She has had previous low vitamin D level.  She is a vegetarian.  Last level that was checked was normal.  No leg pain.  No claudication symptoms.  She did take gabapentin previously for her foot pain but only took 100 mg and does not recall this helping much.  There apparently was no further titration.  She has hypothyroidism and is on replacement.  She is due for follow-up labs.   Observations/Objective: Patient sounds  cheerful and well on the phone. I do not appreciate any SOB. Speech and thought processing are grossly intact. Patient reported vitals:  Assessment and Plan:  #1 left foot pain.  This sounds more neuropathic.  She describes burning sensation that is worse at night. -Recommend start back gabapentin 100 mg at night and every 3 nights titrated up 100 mg up to 300 mg if necessary.  Be in touch if not improved at 300 mg dose  #2 history of hyperglycemia -Order placed for repeat A1c  #3 hypothyroidism.  Due for follow-up labs -Future order for TSH  #4 history of B12 deficiency.  Patient has risk factor of being vegetarian -Recheck B12 level with labs next week  Follow Up Instructions:   99441 5-10 99442 11-20 99443 21-30 I did not refer this patient for an OV in the next 24 hours for this/these issue(s).  I discussed the assessment and treatment plan with the patient. The patient was provided an opportunity to ask questions and all were answered. The patient agreed with the plan and demonstrated an understanding of the instructions.   The patient was advised to call back or seek an in-person evaluation if the symptoms worsen or if the condition fails to improve as anticipated.  I provided 23 minutes of non-face-to-face time during this encounter.   Carolann Littler, MD

## 2018-10-24 ENCOUNTER — Telehealth: Payer: Self-pay | Admitting: Family Medicine

## 2018-10-24 NOTE — Telephone Encounter (Signed)
Copied from [place] [phone]. Topic: Quick Communication - See Telephone Encounter >> Oct 24, 2018 10:11 AM Rutherford Nail, NT wrote: CRM for notification. See Telephone encounter for: 10/24/18. Patient calling to inform Dr Elease Hashimoto that the medication that was prescribed at lat visit (gabapentin 100 mg) seems to be working. States that she does have some questions regarding how long she will need to be on this medication. Would like a call back to discuss. Please advise.  CB#: [phone]

## 2018-10-28 NOTE — Telephone Encounter (Signed)
Duration is impossible to determine.  I would recommend at least 6-8 weeks on medication and then consider trial off to see if pain recurs.

## 2018-10-29 NOTE — Telephone Encounter (Signed)
Called patient and gave her message per Dr. Elease Hashimoto. Patient verbalized an understanding and stated that she will call back if she needs to for the 300 mg Gabapentin instead of taking 3 of the 100 mg. Patient scheduled her CPE on August 14th at 10am.

## 2018-11-21 ENCOUNTER — Other Ambulatory Visit: Payer: Self-pay | Admitting: Family Medicine

## 2018-12-04 ENCOUNTER — Other Ambulatory Visit: Payer: Self-pay | Admitting: Family Medicine

## 2018-12-12 ENCOUNTER — Other Ambulatory Visit (INDEPENDENT_AMBULATORY_CARE_PROVIDER_SITE_OTHER): Payer: Self-pay | Admitting: Orthopaedic Surgery

## 2018-12-12 NOTE — Telephone Encounter (Signed)
Please call pt and inquire why she needs the Keflex-ok to prescribe otherwise-call me if you have any questions

## 2018-12-12 NOTE — Telephone Encounter (Signed)
Tried to call patient to see what the cephalexin refill was for. No answer. Left message on machine for patient to please call back for clarification.

## 2018-12-18 ENCOUNTER — Other Ambulatory Visit: Payer: Self-pay | Admitting: Orthopaedic Surgery

## 2018-12-18 MED ORDER — AMOXICILLIN 500 MG PO CAPS: ORAL_CAPSULE | ORAL | 1 refills | Status: DC

## 2018-12-26 ENCOUNTER — Other Ambulatory Visit: Payer: Self-pay

## 2018-12-26 ENCOUNTER — Encounter: Payer: Self-pay | Admitting: Family Medicine

## 2018-12-26 ENCOUNTER — Ambulatory Visit (INDEPENDENT_AMBULATORY_CARE_PROVIDER_SITE_OTHER): Payer: Medicare Other | Admitting: Family Medicine

## 2018-12-26 VITALS — BP 120/68 | HR 78 | Temp 98.2°F | Ht 60.5 in | Wt 168.3 lb

## 2018-12-26 DIAGNOSIS — R739 Hyperglycemia, unspecified: Secondary | ICD-10-CM | POA: Diagnosis not present

## 2018-12-26 DIAGNOSIS — E538 Deficiency of other specified B group vitamins: Secondary | ICD-10-CM | POA: Diagnosis not present

## 2018-12-26 DIAGNOSIS — E8881 Metabolic syndrome: Secondary | ICD-10-CM | POA: Diagnosis not present

## 2018-12-26 DIAGNOSIS — Z1159 Encounter for screening for other viral diseases: Secondary | ICD-10-CM | POA: Diagnosis not present

## 2018-12-26 DIAGNOSIS — R21 Rash and other nonspecific skin eruption: Secondary | ICD-10-CM | POA: Diagnosis not present

## 2018-12-26 DIAGNOSIS — Z780 Asymptomatic menopausal state: Secondary | ICD-10-CM

## 2018-12-26 DIAGNOSIS — E039 Hypothyroidism, unspecified: Secondary | ICD-10-CM

## 2018-12-26 DIAGNOSIS — I10 Essential (primary) hypertension: Secondary | ICD-10-CM | POA: Diagnosis not present

## 2018-12-26 DIAGNOSIS — M8580 Other specified disorders of bone density and structure, unspecified site: Secondary | ICD-10-CM | POA: Diagnosis not present

## 2018-12-26 DIAGNOSIS — Z0000 Encounter for general adult medical examination without abnormal findings: Secondary | ICD-10-CM

## 2018-12-26 LAB — BASIC METABOLIC PANEL
BUN: 13 mg/dL (ref 6–23)
CO2: 26 mEq/L (ref 19–32)
Calcium: 9.6 mg/dL (ref 8.4–10.5)
Chloride: 104 mEq/L (ref 96–112)
Creatinine, Ser: 0.6 mg/dL (ref 0.40–1.20)
GFR: 98.78 mL/min (ref >60.00)
Glucose, Bld: 158 mg/dL — ABNORMAL HIGH (ref 70–99)
Potassium: 4 mEq/L (ref 3.5–5.1)
Sodium: 136 mEq/L (ref 135–145)

## 2018-12-26 LAB — HEMOGLOBIN A1C: Hgb A1c MFr Bld: 6.4 % (ref 4.6–6.5)

## 2018-12-26 LAB — HEPATIC FUNCTION PANEL
ALT: 26 U/L (ref 0–35)
AST: 24 U/L (ref 0–37)
Albumin: 4 g/dL (ref 3.5–5.2)
Alkaline Phosphatase: 65 U/L (ref 39–117)
Bilirubin, Direct: 0.2 mg/dL (ref 0.0–0.3)
Total Bilirubin: 0.8 mg/dL (ref 0.2–1.2)
Total Protein: 7.1 g/dL (ref 6.0–8.3)

## 2018-12-26 LAB — VITAMIN B12: Vitamin B-12: 1353 pg/mL — ABNORMAL HIGH (ref 211–911)

## 2018-12-26 LAB — TSH: TSH: 2.06 u[IU]/mL (ref 0.35–4.50)

## 2018-12-26 MED ORDER — TRIAMCINOLONE ACETONIDE 0.1 % EX CREA: 1.0000 "application " | TOPICAL_CREAM | Freq: Two times a day (BID) | CUTANEOUS | 1 refills | Status: DC

## 2018-12-26 MED ORDER — AMOXICILLIN 500 MG PO CAPS: ORAL_CAPSULE | ORAL | 1 refills | Status: DC

## 2018-12-26 NOTE — Patient Instructions (Signed)
Set up repeat mammogram  Schedule repeat DEXA/ bone density scan.

## 2018-12-26 NOTE — Progress Notes (Signed)
Subjective:     Patient ID: Kimberly [name], female   DOB: 1950-06-24, 68 y.o.   MRN: [id number]  HPI  Patient here for Medicare wellness visit and medical follow-up.  Her chronic problems include history of hypertension, GERD, hypothyroidism, osteoarthritis.  She has had both knees replaced and is currently walking 4.5 miles per day.  She feels great overall.  She is very happy to be walking again after her knee surgeries.  She has history of B12 deficiency.  Takes oral replacement.  We had placed several future lab orders including TSH, B12, hepatic, basic metabolic panel, and W0J and she plans to get labs today.  Medications reviewed.  Her blood pressures been stable on low-dose Avapro.  She takes levothyroxine for her thyroid issue.  She is had recent slightly pruritic somewhat circumferential rash left anterior knee.  Has not tried any topical creams for that.  She takes over-the-counter B12 replacement.  History of prediabetes range blood sugars.  Mother had type 2 diabetes.  Past Medical History:  Diagnosis Date  . Anemia 2005   hb 11.5  . Arthritis   . DVT (deep venous thrombosis) ([place])    2003, coumadin x 6 mon  . GERD (gastroesophageal reflux disease) 2001   no problem now  . Hypertension   . Hypothyroidism    off meds  . Neuromuscular disorder ([place])    painful in L foot, being started on Gabapentin- 11/08/2016  . Osteopenia   . Vegetarian diet    eats fish and eggs; no meat   Past Surgical History:  Procedure Laterality Date  . JOINT REPLACEMENT    . KNEE ARTHROSCOPY Right 03/16/2003  . OOPHORECTOMY     "think it was my left"  . TOTAL KNEE ARTHROPLASTY Right 09/14/2014   Procedure: RIGHT TOTAL KNEE ARTHROPLASTY;  Surgeon: Garald Balding, MD;  Location: [facility];  Service: Orthopedics;  Laterality: Right;  . TOTAL KNEE ARTHROPLASTY Left 11/20/2016  . TOTAL KNEE ARTHROPLASTY Left 11/20/2016   Procedure: LEFT TOTAL KNEE ARTHROPLASTY;  Surgeon: Garald Balding, MD;  Location:  [facility];  Service: Orthopedics;  Laterality: Left;    reports that she has never smoked. She has never used smokeless tobacco. She reports that she does not drink alcohol or use drugs. family history includes Alcohol abuse in an other family member; Diabetes in her mother and another family member; Heart failure in her father. Allergies  Allergen Reactions  . Enoxaparin Sodium Itching and Swelling    SWELLING REACTION UNSPECIFIED    1.  Risk factors based on Past Medical , Social, and Family history reviewed and as indicated above with no changes  2.  Limitations in physical activities None.  No recent falls.  Walking 4-1/2 miles daily  3.  Depression/mood No active depression or anxiety issues PHQ 2=0  4.  Hearing No defiits  5.  ADLs independent in all.  6.  Cognitive function (orientation to time and place, language, writing, speech,memory) no short or long term memory issues.  Language and judgement intact.  7.  Home Safety no issues  8.  Height, weight, and visual acuity.all stable.  9.  Counseling discussed age-appropriate preventative/health maintenance screenings  10. Recommendation of preventive services.  Repeat mammogram.  Continue annual flu vaccine  11. Labs based on risk factors-TSH, B12, hepatic panel, basic metabolic panel, hemoglobin A1c, hepatitis C antibody  12. Care Plan- as above  13. Other Providers Dr. Mickle Mallory surgeon  14. Written schedule of screening/prevention services given  to patient. Health Maintenance Due  Topic Date Due  . Hepatitis C Screening  1950/08/04  . TETANUS/TDAP  02/11/2017  . INFLUENZA VACCINE  12/13/2018     Review of Systems  Constitutional: Negative for activity change, appetite change, fatigue, fever and unexpected weight change.  HENT: Negative for ear pain, hearing loss, sore throat and trouble swallowing.   Eyes: Negative for visual disturbance.  Respiratory: Negative for cough and shortness of breath.    Cardiovascular: Negative for chest pain and palpitations.  Gastrointestinal: Negative for abdominal pain, blood in stool, constipation and diarrhea.  Endocrine: Negative for polydipsia and polyuria.  Genitourinary: Negative for dysuria and hematuria.  Musculoskeletal: Negative for arthralgias, back pain and myalgias.  Skin: Negative for rash.  Neurological: Negative for dizziness, syncope and headaches.  Hematological: Negative for adenopathy.  Psychiatric/Behavioral: Negative for confusion and dysphoric mood.       Objective:   Physical Exam Constitutional:      Appearance: She is well-developed.  HENT:     Head: Normocephalic and atraumatic.  Eyes:     Pupils: Pupils are equal, round, and reactive to light.  Neck:     Musculoskeletal: Normal range of motion and neck supple.     Thyroid: No thyromegaly.  Cardiovascular:     Rate and Rhythm: Normal rate and regular rhythm.     Heart sounds: Normal heart sounds. No murmur.  Pulmonary:     Effort: No respiratory distress.     Breath sounds: Normal breath sounds. No wheezing or rales.  Abdominal:     General: Bowel sounds are normal. There is no distension.     Palpations: Abdomen is soft. There is no mass.     Tenderness: There is no abdominal tenderness. There is no guarding or rebound.  Musculoskeletal: Normal range of motion.  Lymphadenopathy:     Cervical: No cervical adenopathy.  Skin:    Findings: Rash present.     Comments: Patient has skin rash left anterior knee which is nummular with slightly scaly border.  Neurological:     Mental Status: She is alert and oriented to person, place, and time.     Cranial Nerves: No cranial nerve deficit.     Deep Tendon Reflexes: Reflexes normal.  Psychiatric:        Behavior: Behavior normal.        Thought Content: Thought content normal.        Judgment: Judgment normal.        Assessment:     #1 Medicare subsequent annual wellness visit.  We discussed health  maintenance issues as below  #2 hypertension stable and at goal  #3 hypothyroidism  #4 history of B12 deficiency  #5 history of prediabetes  #6 probable eczematous rash left anterior knee    Plan:     -Set up repeat mammogram -Check hepatitis C antibody -Other follow-up labs based on medical problems including TSH, B12, hepatic panel, basic metabolic panel, hemoglobin A1c -Continue annual flu vaccine -Triamcinolone 0.1% cream to left knee rash and be in touch in 2 weeks if not resolving -We discussed setting up repeat DEXA scan.  She had osteopenia on last scan 4 years ago.  Eulas Post MD [facility] at [facility]

## 2018-12-27 ENCOUNTER — Other Ambulatory Visit: Payer: Self-pay | Admitting: Family Medicine

## 2018-12-29 ENCOUNTER — Other Ambulatory Visit: Payer: Self-pay | Admitting: Family Medicine

## 2018-12-29 ENCOUNTER — Other Ambulatory Visit: Payer: Self-pay

## 2018-12-29 ENCOUNTER — Ambulatory Visit (INDEPENDENT_AMBULATORY_CARE_PROVIDER_SITE_OTHER)
Admission: RE | Admit: 2018-12-29 | Discharge: 2018-12-29 | Disposition: A | Discharge status: Home / Self Care | Payer: Medicare Other | Source: Ambulatory Visit | Attending: Family Medicine | Admitting: Family Medicine

## 2018-12-29 DIAGNOSIS — Z1231 Encounter for screening mammogram for malignant neoplasm of breast: Secondary | ICD-10-CM

## 2018-12-29 DIAGNOSIS — Z780 Asymptomatic menopausal state: Secondary | ICD-10-CM | POA: Diagnosis not present

## 2018-12-29 LAB — HEPATITIS C ANTIBODY
Hepatitis C Ab: NONREACTIVE
SIGNAL TO CUT-OFF: 0.03 (ref <1.00)

## 2019-01-17 DIAGNOSIS — Z23 Encounter for immunization: Secondary | ICD-10-CM | POA: Diagnosis not present

## 2019-01-21 DIAGNOSIS — G5792 Unspecified mononeuropathy of left lower limb: Secondary | ICD-10-CM | POA: Diagnosis not present

## 2019-01-21 DIAGNOSIS — G5762 Lesion of plantar nerve, left lower limb: Secondary | ICD-10-CM | POA: Diagnosis not present

## 2019-01-21 DIAGNOSIS — M898X7 Other specified disorders of bone, ankle and foot: Secondary | ICD-10-CM | POA: Diagnosis not present

## 2019-01-23 DIAGNOSIS — H40013 Open angle with borderline findings, low risk, bilateral: Secondary | ICD-10-CM | POA: Diagnosis not present

## 2019-02-10 ENCOUNTER — Other Ambulatory Visit: Payer: Self-pay

## 2019-02-10 ENCOUNTER — Ambulatory Visit
Admission: RE | Admit: 2019-02-10 | Discharge: 2019-02-10 | Disposition: A | Discharge status: Home / Self Care | Payer: Medicare Other | Source: Ambulatory Visit | Attending: Family Medicine | Admitting: Family Medicine

## 2019-02-10 DIAGNOSIS — Z1231 Encounter for screening mammogram for malignant neoplasm of breast: Secondary | ICD-10-CM | POA: Diagnosis not present

## 2019-02-11 DIAGNOSIS — G5792 Unspecified mononeuropathy of left lower limb: Secondary | ICD-10-CM | POA: Diagnosis not present

## 2019-02-11 DIAGNOSIS — M898X7 Other specified disorders of bone, ankle and foot: Secondary | ICD-10-CM | POA: Diagnosis not present

## 2019-03-04 DIAGNOSIS — M898X7 Other specified disorders of bone, ankle and foot: Secondary | ICD-10-CM | POA: Diagnosis not present

## 2019-03-04 DIAGNOSIS — G5792 Unspecified mononeuropathy of left lower limb: Secondary | ICD-10-CM | POA: Diagnosis not present

## 2019-03-05 ENCOUNTER — Other Ambulatory Visit: Payer: Self-pay | Admitting: Family Medicine

## 2019-03-18 DIAGNOSIS — M5416 Radiculopathy, lumbar region: Secondary | ICD-10-CM | POA: Diagnosis not present

## 2019-03-18 DIAGNOSIS — M79672 Pain in left foot: Secondary | ICD-10-CM | POA: Diagnosis not present

## 2019-03-23 DIAGNOSIS — M79672 Pain in left foot: Secondary | ICD-10-CM | POA: Diagnosis not present

## 2019-03-23 DIAGNOSIS — M5416 Radiculopathy, lumbar region: Secondary | ICD-10-CM | POA: Diagnosis not present

## 2019-03-25 DIAGNOSIS — M5416 Radiculopathy, lumbar region: Secondary | ICD-10-CM | POA: Diagnosis not present

## 2019-03-25 DIAGNOSIS — M79672 Pain in left foot: Secondary | ICD-10-CM | POA: Diagnosis not present

## 2019-03-30 ENCOUNTER — Ambulatory Visit: Payer: Self-pay | Admitting: Family Medicine

## 2019-03-30 DIAGNOSIS — R519 Headache, unspecified: Secondary | ICD-10-CM | POA: Diagnosis not present

## 2019-03-30 DIAGNOSIS — R072 Precordial pain: Secondary | ICD-10-CM | POA: Diagnosis not present

## 2019-03-30 DIAGNOSIS — E6609 Other obesity due to excess calories: Secondary | ICD-10-CM | POA: Diagnosis not present

## 2019-03-30 DIAGNOSIS — M79672 Pain in left foot: Secondary | ICD-10-CM | POA: Diagnosis not present

## 2019-03-30 DIAGNOSIS — M5416 Radiculopathy, lumbar region: Secondary | ICD-10-CM | POA: Diagnosis not present

## 2019-03-30 DIAGNOSIS — I10 Essential (primary) hypertension: Secondary | ICD-10-CM | POA: Diagnosis not present

## 2019-03-30 NOTE — Telephone Encounter (Signed)
OK to schedule for same day tomorrow and make 30 minute

## 2019-03-30 NOTE — Telephone Encounter (Signed)
Please see message.  Please advise. 

## 2019-03-30 NOTE — Telephone Encounter (Signed)
Pt reports headache "Sharp shooting pain near right ear "A little to the top of it." Onset 5 days ago. States "Comes and goes."  Took 1 tylenol last evening, did not have another shooting pain until this AM. Denies weakness, no visual changes, no nausea. Does reports mild dizziness at times when first standing."But does not occur with the pain, doesn't happen often."  [place] call ed practice, [facility], for consideration of appt.  Pt is not able to make it to appt today, requested tomorrow, can do virtual as well.  Pt made aware no availability until Wednesday but practice will  route note to Dr. Elease Hashimoto for his review.  Advised she will hear back from practice. Care advise given; advised if pain worsens in intensity or frequency, dizziness worsens, any unilateral weakness or visual changes occurs, go to ED. Pt verbalizes understanding.   CB# cell  [place]  Reason for Disposition . [1] New headache AND [2] age > 75  Answer Assessment - Initial Assessment Questions 1. LOCATION: "Where does it hurt?"     Right side head, shooting pain right above right ear. 2. ONSET: "When did the headache start?" (Minutes, hours or days)      4-5 days ago 3. PATTERN: "Does the pain come and go, or has it been constant since it started?"    Comes and goes 4. SEVERITY: "How bad is the pain?" and "What does it keep you from doing?"  (e.g., Scale 1-10; mild, moderate, or severe)   - MILD (1-3): doesn't interfere with normal activities    - MODERATE (4-7): interferes with normal activities or awakens from sleep    - SEVERE (8-10): excruciating pain, unable to do any normal activities        "Shooting, sharp"5. RECURRENT SYMPTOM: "Have you ever had headaches before?" If so, ask: "When was the last time?" and "What happened that time?"      no 6. CAUSE: "What do you think is causing the headache?"     unsure 7. MIGRAINE: "Have you been diagnosed with migraine headaches?" If so, ask: "Is this  headache similar?"      no 8. HEAD INJURY: "Has there been any recent injury to the head?"      no 9. OTHER SYMPTOMS: "Do you have any other symptoms?" (fever, stiff neck, eye pain, sore throat, cold symptoms)    no  Protocols used: HEADACHE-A-AH

## 2019-03-31 ENCOUNTER — Encounter: Payer: Self-pay | Admitting: Family Medicine

## 2019-03-31 ENCOUNTER — Other Ambulatory Visit: Payer: Self-pay

## 2019-03-31 ENCOUNTER — Ambulatory Visit (INDEPENDENT_AMBULATORY_CARE_PROVIDER_SITE_OTHER): Payer: Medicare Other | Admitting: Family Medicine

## 2019-03-31 VITALS — BP 142/80 | HR 65 | Temp 97.3°F | Ht 60.5 in | Wt 172.2 lb

## 2019-03-31 DIAGNOSIS — R519 Headache, unspecified: Secondary | ICD-10-CM

## 2019-03-31 DIAGNOSIS — G44319 Acute post-traumatic headache, not intractable: Secondary | ICD-10-CM

## 2019-03-31 DIAGNOSIS — I10 Essential (primary) hypertension: Secondary | ICD-10-CM

## 2019-03-31 NOTE — Telephone Encounter (Signed)
Called patient and LMOVM to return call  [place] for [facility] to Discuss results / PCP / recommendations / Schedule patient  Left a detailed voice message to let patient know that I have scheduled her for today at 3:45pm and to please arrive by 3:30pm.   CRM Created.

## 2019-03-31 NOTE — Patient Instructions (Signed)
We will set up CT head  Increase the Avapro to 300 mg daily  Set up 2 week follow up.

## 2019-03-31 NOTE — Progress Notes (Signed)
Subjective:     Patient ID: Kimberly [name], female   DOB: 09-Aug-1950, 68 y.o.   MRN: [id number]  HPI [name] is seen with 4 or 5-day history of intermittent right parietal headache.  She describes this as a sharp pain is very transient lasting 4 to 5 seconds.  No history of similar headache.  No clear triggers.  She had some chronic issues with her right eye but no acute visual changes.  She does relate that she fell about 3 and half weeks ago and bruised her right arm.  She is not sure if she hit her head at that point but there is no loss of consciousness.  Her headaches though did not start until few days ago.  No nausea or vomiting.  Denies any facial pain.  No skin rash.  No sudden visual loss.  No focal weakness.  She has a cardiology friend and called them last night and was actually seen in their office had a EKG which showed nonspecific ST changes and sinus rhythm.  Blood pressure was 180/90 which is very atypical for her.  She was given 1 tablet of Lasix 40 mg and instructed to take half tablet yesterday and half tablet today.  Her blood pressure is some improved today.  No recent chest pain.  Past Medical History:  Diagnosis Date  . Anemia 2005   hb 11.5  . Arthritis   . DVT (deep venous thrombosis) ([place])    2003, coumadin x 6 mon  . GERD (gastroesophageal reflux disease) 2001   no problem now  . Hypertension   . Hypothyroidism    off meds  . Neuromuscular disorder ([place])    painful in L foot, being started on Gabapentin- 11/08/2016  . Osteopenia   . Vegetarian diet    eats fish and eggs; no meat   Past Surgical History:  Procedure Laterality Date  . JOINT REPLACEMENT    . KNEE ARTHROSCOPY Right 03/16/2003  . OOPHORECTOMY     "think it was my left"  . TOTAL KNEE ARTHROPLASTY Right 09/14/2014   Procedure: RIGHT TOTAL KNEE ARTHROPLASTY;  Surgeon: Garald Balding, MD;  Location: [facility];  Service: Orthopedics;  Laterality: Right;  . TOTAL KNEE ARTHROPLASTY Left 11/20/2016  .  TOTAL KNEE ARTHROPLASTY Left 11/20/2016   Procedure: LEFT TOTAL KNEE ARTHROPLASTY;  Surgeon: Garald Balding, MD;  Location: [facility];  Service: Orthopedics;  Laterality: Left;    reports that she has never smoked. She has never used smokeless tobacco. She reports that she does not drink alcohol or use drugs. family history includes Alcohol abuse in an other family member; Diabetes in her mother and another family member; Heart failure in her father. Allergies  Allergen Reactions  . Enoxaparin Sodium Itching and Swelling    SWELLING REACTION UNSPECIFIED      Review of Systems  Constitutional: Negative for chills and fever.  Eyes: Negative for photophobia.  Respiratory: Negative for shortness of breath.   Cardiovascular: Negative for chest pain.  Gastrointestinal: Negative for abdominal pain, nausea and vomiting.  Neurological: Positive for headaches. Negative for seizures, syncope, speech difficulty and weakness.  Psychiatric/Behavioral: Negative for confusion.       Objective:   Physical Exam Vitals signs reviewed.  Constitutional:      Appearance: She is well-developed.  HENT:     Head: Normocephalic and atraumatic.  Eyes:     Pupils: Pupils are equal, round, and reactive to light.  Neck:     Musculoskeletal:  Normal range of motion and neck supple.  Cardiovascular:     Rate and Rhythm: Normal rate and regular rhythm.  Pulmonary:     Effort: Pulmonary effort is normal.     Breath sounds: Normal breath sounds.  Neurological:     Mental Status: She is alert.     Cranial Nerves: No cranial nerve deficit or facial asymmetry.     Sensory: No sensory deficit.     Motor: No weakness.     Coordination: Coordination normal.     Gait: Gait normal.  Psychiatric:        Mood and Affect: Mood normal.        Behavior: Behavior normal.        Assessment:     #1 atypical unilateral headache.  She is describing very fleeting right parietal headache.  Question neuropathic.  Did  have recent fall but not clear that she had any head injury with the fall and she cannot really recall.  She does not have any focal neurologic deficits at this time  #2 hypertension poorly controlled.  Improved from yesterday was still up today    Plan:     -Get CT head without contrast -Went over red flags of things to watch for with headache such as confusion, seizure, focal weakness, recurrent vomiting, etc. -Increase Avapro to 300 mg once daily -Set up 2-week follow-up to reassess blood pressure  Eulas Post MD [facility] at [facility]

## 2019-04-01 ENCOUNTER — Telehealth: Payer: Self-pay

## 2019-04-01 DIAGNOSIS — E6609 Other obesity due to excess calories: Secondary | ICD-10-CM | POA: Diagnosis not present

## 2019-04-01 DIAGNOSIS — R072 Precordial pain: Secondary | ICD-10-CM | POA: Diagnosis not present

## 2019-04-01 DIAGNOSIS — I10 Essential (primary) hypertension: Secondary | ICD-10-CM | POA: Diagnosis not present

## 2019-04-01 DIAGNOSIS — R519 Headache, unspecified: Secondary | ICD-10-CM | POA: Diagnosis not present

## 2019-04-01 NOTE — Telephone Encounter (Signed)
Spoke with husband.  He states the note that I read back from the [facility] does not accurately state his concern.  He was not stating that he was concerned that "nothing was being done ".  His concern only was that the CT was being delayed until next Tuesday pending insurance approval.  Her blood pressure is actually improved today with recent increase in Avapro to 300 mg.  They got blood pressure today of 138/90 which is improving.  Her headache is some better today.  No new symptoms.

## 2019-04-01 NOTE — Telephone Encounter (Signed)
Please see message.  Copied from [place] [id number]. Topic: General - Inquiry >> Apr 01, 2019 11:42 AM Mathis Bud wrote: Reason for CRM: Patient husband called stating they cannot get an appt for GI MRI till next week.  Patient husband is expressing that he is very concerned about patients blood pressure.  Patient is requesting a call back regarding.  Patient husband states she saw PCP yesterday and PCP knew about BP is confused on why nothing is being done.   BP today 154 - does not know lower Wife number [phone] Call back [phone]

## 2019-04-02 ENCOUNTER — Other Ambulatory Visit: Payer: Self-pay | Admitting: Family Medicine

## 2019-04-07 ENCOUNTER — Ambulatory Visit (HOSPITAL_COMMUNITY)
Admission: RE | Admit: 2019-04-07 | Discharge: 2019-04-07 | Disposition: A | Discharge status: Home / Self Care | Payer: Medicare Other | Source: Ambulatory Visit | Attending: Family Medicine | Admitting: Family Medicine

## 2019-04-07 ENCOUNTER — Encounter (HOSPITAL_COMMUNITY): Payer: Self-pay

## 2019-04-07 ENCOUNTER — Other Ambulatory Visit: Payer: Self-pay

## 2019-04-07 DIAGNOSIS — M5416 Radiculopathy, lumbar region: Secondary | ICD-10-CM | POA: Diagnosis not present

## 2019-04-07 DIAGNOSIS — G44319 Acute post-traumatic headache, not intractable: Secondary | ICD-10-CM | POA: Diagnosis not present

## 2019-04-07 DIAGNOSIS — M79672 Pain in left foot: Secondary | ICD-10-CM | POA: Diagnosis not present

## 2019-04-13 DIAGNOSIS — M898X7 Other specified disorders of bone, ankle and foot: Secondary | ICD-10-CM | POA: Diagnosis not present

## 2019-04-13 DIAGNOSIS — G5792 Unspecified mononeuropathy of left lower limb: Secondary | ICD-10-CM | POA: Diagnosis not present

## 2019-04-14 ENCOUNTER — Other Ambulatory Visit: Payer: Self-pay

## 2019-04-14 ENCOUNTER — Ambulatory Visit: Payer: Medicare Other | Admitting: Family Medicine

## 2019-04-15 ENCOUNTER — Encounter: Payer: Self-pay | Admitting: Family Medicine

## 2019-04-15 ENCOUNTER — Other Ambulatory Visit: Payer: Self-pay

## 2019-04-15 ENCOUNTER — Ambulatory Visit (INDEPENDENT_AMBULATORY_CARE_PROVIDER_SITE_OTHER): Payer: Medicare Other | Admitting: Family Medicine

## 2019-04-15 VITALS — BP 124/78 | HR 76 | Temp 97.2°F | Ht 60.5 in | Wt 171.6 lb

## 2019-04-15 DIAGNOSIS — I10 Essential (primary) hypertension: Secondary | ICD-10-CM | POA: Diagnosis not present

## 2019-04-15 MED ORDER — IRBESARTAN 300 MG PO TABS: 300.0000 mg | ORAL_TABLET | Freq: Every day | ORAL | 3 refills | Status: DC

## 2019-04-15 NOTE — Progress Notes (Signed)
Subjective:     Patient ID: Kimberly [name], female   DOB: 03-11-1951, 68 y.o.   MRN: [id number]  HPI Kimberly [name] is seen for follow-up regarding hypertension.  Refer to recent note.  She had atypical headache and had recent fall.  We sent her for CT of head which showed only some chronic microvascular changes but no acute findings.  Her blood pressure was mildly elevated and we increased her Avapro to 300 mg.  Her home blood pressures been slightly up but she thinks her machine might be registering high.  She has well-controlled blood pressure today.  No further headaches.  No confusion.  Past Medical History:  Diagnosis Date  . Anemia 2005   hb 11.5  . Arthritis   . DVT (deep venous thrombosis) ([place])    2003, coumadin x 6 mon  . GERD (gastroesophageal reflux disease) 2001   no problem now  . Hypertension   . Hypothyroidism    off meds  . Neuromuscular disorder ([place])    painful in L foot, being started on Gabapentin- 11/08/2016  . Osteopenia   . Vegetarian diet    eats fish and eggs; no meat   Past Surgical History:  Procedure Laterality Date  . JOINT REPLACEMENT    . KNEE ARTHROSCOPY Right 03/16/2003  . OOPHORECTOMY     "think it was my left"  . TOTAL KNEE ARTHROPLASTY Right 09/14/2014   Procedure: RIGHT TOTAL KNEE ARTHROPLASTY;  Surgeon: Garald Balding, MD;  Location: [place];  Service: Orthopedics;  Laterality: Right;  . TOTAL KNEE ARTHROPLASTY Left 11/20/2016  . TOTAL KNEE ARTHROPLASTY Left 11/20/2016   Procedure: LEFT TOTAL KNEE ARTHROPLASTY;  Surgeon: Garald Balding, MD;  Location: [place];  Service: Orthopedics;  Laterality: Left;    reports that she has never smoked. She has never used smokeless tobacco. She reports that she does not drink alcohol or use drugs. family history includes Alcohol abuse in an other family member; Diabetes in her mother and another family member; Heart failure in her father. Allergies  Allergen Reactions  . Enoxaparin Sodium Itching and Swelling     SWELLING REACTION UNSPECIFIED      Review of Systems  Constitutional: Negative for fatigue.  Eyes: Negative for visual disturbance.  Respiratory: Negative for cough, chest tightness, shortness of breath and wheezing.   Cardiovascular: Negative for chest pain, palpitations and leg swelling.  Neurological: Negative for dizziness, seizures, syncope, weakness, light-headedness and headaches.       Objective:   Physical Exam Constitutional:      Appearance: She is well-developed.  Eyes:     Pupils: Pupils are equal, round, and reactive to light.  Neck:     Musculoskeletal: Neck supple.     Thyroid: No thyromegaly.     Vascular: No JVD.  Cardiovascular:     Rate and Rhythm: Normal rate and regular rhythm.     Heart sounds: No gallop.   Pulmonary:     Effort: Pulmonary effort is normal. No respiratory distress.     Breath sounds: Normal breath sounds. No wheezing or rales.  Neurological:     Mental Status: She is alert.        Assessment:     #1 hypertension improved with recent increased dose of Avapro  #2 recent atypical headaches resolved with recent CT head unremarkable    Plan:     -Refilled Avapro 300 mg daily -Continue to monitor blood pressure and be in touch if consistently greater than 140/90  Kimberly Post MD [facility] at [facility]

## 2019-05-06 ENCOUNTER — Other Ambulatory Visit (HOSPITAL_COMMUNITY): Payer: Self-pay | Admitting: Cardiovascular Disease

## 2019-05-06 DIAGNOSIS — R9431 Abnormal electrocardiogram [ECG] [EKG]: Secondary | ICD-10-CM

## 2019-05-06 DIAGNOSIS — R079 Chest pain, unspecified: Secondary | ICD-10-CM

## 2019-05-13 ENCOUNTER — Other Ambulatory Visit: Payer: Self-pay

## 2019-05-13 ENCOUNTER — Ambulatory Visit (HOSPITAL_COMMUNITY)
Admission: RE | Admit: 2019-05-13 | Discharge: 2019-05-13 | Disposition: A | Discharge status: Home / Self Care | Payer: Medicare Other | Source: Ambulatory Visit | Attending: Cardiovascular Disease | Admitting: Cardiovascular Disease

## 2019-05-13 DIAGNOSIS — E663 Overweight: Secondary | ICD-10-CM | POA: Diagnosis not present

## 2019-05-13 DIAGNOSIS — R9431 Abnormal electrocardiogram [ECG] [EKG]: Secondary | ICD-10-CM | POA: Diagnosis present

## 2019-05-13 DIAGNOSIS — R079 Chest pain, unspecified: Secondary | ICD-10-CM | POA: Diagnosis present

## 2019-05-13 DIAGNOSIS — I10 Essential (primary) hypertension: Secondary | ICD-10-CM | POA: Diagnosis not present

## 2019-05-13 DIAGNOSIS — R072 Precordial pain: Secondary | ICD-10-CM | POA: Diagnosis not present

## 2019-05-13 MED ORDER — REGADENOSON 0.4 MG/5ML IV SOLN
0.4000 mg | Freq: Once | INTRAVENOUS | Status: AC
  Administered 2019-05-13: 0.4 mg via INTRAVENOUS

## 2019-05-13 MED ORDER — TECHNETIUM TC 99M TETROFOSMIN IV KIT
29.6000 | PACK | Freq: Once | INTRAVENOUS | Status: AC | PRN
  Administered 2019-05-13: 29.6 via INTRAVENOUS

## 2019-05-13 MED ORDER — TECHNETIUM TC 99M TETROFOSMIN IV KIT
10.3000 | PACK | Freq: Once | INTRAVENOUS | Status: AC | PRN
  Administered 2019-05-13: 10.3 via INTRAVENOUS

## 2019-05-13 MED ORDER — REGADENOSON 0.4 MG/5ML IV SOLN
INTRAVENOUS | Status: AC
  Filled 2019-05-13: qty 5

## 2019-06-04 ENCOUNTER — Ambulatory Visit: Payer: Medicare Other | Attending: Internal Medicine

## 2019-06-04 DIAGNOSIS — Z23 Encounter for immunization: Secondary | ICD-10-CM | POA: Insufficient documentation

## 2019-06-04 NOTE — Progress Notes (Signed)
   Covid-19 Vaccination Clinic  Name:  Kimberly [name]    MRN: [id number] DOB: 10-08-1950  06/04/2019  [name] was observed post Covid-19 immunization for 15 minutes without incidence. She was provided with Vaccine Information Sheet and instruction to access the V-Safe system.   [name] was instructed to call 911 with any severe reactions post vaccine: Mar[place] Kitchen Difficulty breathing  . Swelling of your face and throat  . A fast heartbeat  . A bad rash all over your body  . Dizziness and weakness    Immunizations Administered    Name Date Dose VIS Date Route   Pfizer COVID-19 Vaccine 06/04/2019  4:25 PM 0.3 mL 04/24/2019 Intramuscular   Manufacturer: [place]   Lot: [id number]   [place]: [id number]

## 2019-06-25 ENCOUNTER — Ambulatory Visit: Payer: Medicare Other | Attending: Internal Medicine

## 2019-06-25 DIAGNOSIS — Z23 Encounter for immunization: Secondary | ICD-10-CM | POA: Insufficient documentation

## 2019-06-25 NOTE — Progress Notes (Signed)
   Covid-19 Vaccination Clinic  Name:  Kimberly [name]    MRN: [id number] DOB: [birth year]  06/25/2019  [name] was observed post Covid-19 immunization for 15 minutes without incidence. She was provided with Vaccine Information Sheet and instruction to access the V-Safe system.   [name] was instructed to call 911 with any severe reactions post vaccine: Marland Kitchen Difficulty breathing  . Swelling of your face and throat  . A fast heartbeat  . A bad rash all over your body  . Dizziness and weakness    Immunizations Administered    Name Date Dose VIS Date Route   Pfizer COVID-19 Vaccine 06/25/2019  4:53 PM 0.3 mL 04/24/2019 Intramuscular   Manufacturer: [place]   Lot: [id number]   [place]: [id number]

## 2019-06-26 ENCOUNTER — Other Ambulatory Visit: Payer: Self-pay | Admitting: Family Medicine

## 2019-07-06 DIAGNOSIS — M21612 Bunion of left foot: Secondary | ICD-10-CM | POA: Diagnosis not present

## 2019-07-06 DIAGNOSIS — M21611 Bunion of right foot: Secondary | ICD-10-CM | POA: Diagnosis not present

## 2019-07-08 ENCOUNTER — Ambulatory Visit: Payer: Medicare Other

## 2019-07-13 ENCOUNTER — Other Ambulatory Visit: Payer: Self-pay | Admitting: Family Medicine

## 2019-07-28 DIAGNOSIS — R635 Abnormal weight gain: Secondary | ICD-10-CM | POA: Diagnosis not present

## 2019-07-28 DIAGNOSIS — R1013 Epigastric pain: Secondary | ICD-10-CM | POA: Diagnosis not present

## 2019-07-28 DIAGNOSIS — K219 Gastro-esophageal reflux disease without esophagitis: Secondary | ICD-10-CM | POA: Diagnosis not present

## 2019-07-29 LAB — HEPATIC FUNCTION PANEL
ALT: 25 (ref 7–35)
AST: 27 (ref 13–35)
Alkaline Phosphatase: 69 (ref 25–125)
Bilirubin, Direct: 0.13 (ref 0.01–0.4)
Bilirubin, Total: 0.5

## 2019-07-29 LAB — CBC: RBC: 4.06 (ref 3.87–5.11)

## 2019-07-29 LAB — BASIC METABOLIC PANEL
BUN: 12 (ref 4–21)
CO2: 23 — AB (ref 13–22)
Chloride: 101 (ref 99–108)
Creatinine: 0.6 (ref 0.5–1.1)
Glucose: 127
Potassium: 4.1 (ref 3.4–5.3)
Sodium: 136 — AB (ref 137–147)

## 2019-07-29 LAB — CBC AND DIFFERENTIAL
HCT: 36 (ref 36–46)
Hemoglobin: 12.5 (ref 12.0–16.0)
Platelets: 266 (ref 150–399)
WBC: 6.5

## 2019-07-29 LAB — COMPREHENSIVE METABOLIC PANEL
Albumin: 4.1 (ref 3.5–5.0)
Calcium: 9.5 (ref 8.7–10.7)
GFR calc Af Amer: 93
GFR calc non Af Amer: 107

## 2019-08-04 ENCOUNTER — Encounter: Payer: Self-pay | Admitting: Family Medicine

## 2019-08-26 DIAGNOSIS — N949 Unspecified condition associated with female genital organs and menstrual cycle: Secondary | ICD-10-CM | POA: Diagnosis not present

## 2019-08-26 DIAGNOSIS — Z124 Encounter for screening for malignant neoplasm of cervix: Secondary | ICD-10-CM | POA: Diagnosis not present

## 2019-08-26 DIAGNOSIS — Z01419 Encounter for gynecological examination (general) (routine) without abnormal findings: Secondary | ICD-10-CM | POA: Diagnosis not present

## 2019-09-24 DIAGNOSIS — R102 Pelvic and perineal pain: Secondary | ICD-10-CM | POA: Diagnosis not present

## 2019-10-07 DIAGNOSIS — M4726 Other spondylosis with radiculopathy, lumbar region: Secondary | ICD-10-CM | POA: Diagnosis not present

## 2019-10-07 DIAGNOSIS — M5136 Other intervertebral disc degeneration, lumbar region: Secondary | ICD-10-CM | POA: Diagnosis not present

## 2019-10-07 DIAGNOSIS — M545 Low back pain: Secondary | ICD-10-CM | POA: Diagnosis not present

## 2019-10-09 ENCOUNTER — Other Ambulatory Visit: Payer: Self-pay | Admitting: Family Medicine

## 2019-10-20 DIAGNOSIS — M5116 Intervertebral disc disorders with radiculopathy, lumbar region: Secondary | ICD-10-CM | POA: Diagnosis not present

## 2019-10-20 DIAGNOSIS — M4726 Other spondylosis with radiculopathy, lumbar region: Secondary | ICD-10-CM | POA: Diagnosis not present

## 2019-10-20 DIAGNOSIS — M4727 Other spondylosis with radiculopathy, lumbosacral region: Secondary | ICD-10-CM | POA: Diagnosis not present

## 2019-11-24 DIAGNOSIS — M48061 Spinal stenosis, lumbar region without neurogenic claudication: Secondary | ICD-10-CM | POA: Diagnosis not present

## 2019-12-22 DIAGNOSIS — M48061 Spinal stenosis, lumbar region without neurogenic claudication: Secondary | ICD-10-CM | POA: Diagnosis not present

## 2019-12-22 DIAGNOSIS — M4726 Other spondylosis with radiculopathy, lumbar region: Secondary | ICD-10-CM | POA: Diagnosis not present

## 2020-01-01 ENCOUNTER — Other Ambulatory Visit: Payer: Self-pay | Admitting: Family Medicine

## 2020-01-20 DIAGNOSIS — Z23 Encounter for immunization: Secondary | ICD-10-CM | POA: Diagnosis not present

## 2020-02-02 ENCOUNTER — Other Ambulatory Visit: Payer: Self-pay | Admitting: Family Medicine

## 2020-02-10 ENCOUNTER — Other Ambulatory Visit: Payer: Self-pay | Admitting: Family Medicine

## 2020-02-10 NOTE — Telephone Encounter (Signed)
Just need your OK to fill for dental procedure?

## 2020-02-11 NOTE — Telephone Encounter (Signed)
OK to refill

## 2020-02-23 ENCOUNTER — Ambulatory Visit (INDEPENDENT_AMBULATORY_CARE_PROVIDER_SITE_OTHER): Payer: Medicare Other | Admitting: Family Medicine

## 2020-02-23 ENCOUNTER — Encounter: Payer: Self-pay | Admitting: Family Medicine

## 2020-02-23 ENCOUNTER — Other Ambulatory Visit: Payer: Self-pay

## 2020-02-23 VITALS — BP 116/72 | HR 81 | Temp 98.4°F | Ht 60.5 in | Wt 172.0 lb

## 2020-02-23 DIAGNOSIS — M792 Neuralgia and neuritis, unspecified: Secondary | ICD-10-CM | POA: Diagnosis not present

## 2020-02-23 DIAGNOSIS — R7303 Prediabetes: Secondary | ICD-10-CM | POA: Insufficient documentation

## 2020-02-23 DIAGNOSIS — M79672 Pain in left foot: Secondary | ICD-10-CM | POA: Diagnosis not present

## 2020-02-23 MED ORDER — PREGABALIN 75 MG PO CAPS: 75.0000 mg | ORAL_CAPSULE | Freq: Two times a day (BID) | ORAL | 1 refills | Status: DC

## 2020-02-23 NOTE — Patient Instructions (Addendum)
If you have lab work done today you will be contacted with your lab results within the next 2 weeks.  If you have not heard from [place] then please contact us. The fastest way to get your results is to register for My Chart.   IF you received an x-ray today, you will receive an invoice from [facility]. Please contact [facility] at [phone] with questions or concerns regarding your invoice.   IF you received labwork today, you will receive an invoice from [place]. Please contact [facility] at [phone] with questions or concerns regarding your invoice.   Our billing staff will not be able to assist you with questions regarding bills from these companies.  You will be contacted with the lab results as soon as they are available. The fastest way to get your results is to activate your My Chart account. Instructions are located on the last page of this paperwork. If you have not heard from [place] regarding the results in 2 weeks, please contact this office.     Neuropathic Pain Neuropathic pain is pain caused by damage to the nerves that are responsible for certain sensations in your body (sensory nerves). The pain can be caused by:  Damage to the sensory nerves that send signals to your spinal cord and brain (peripheral nervous system).  Damage to the sensory nerves in your brain or spinal cord (central nervous system). Neuropathic pain can make you more sensitive to pain. Even a minor sensation can feel very painful. This is usually a long-term condition that can be difficult to treat. The type of pain differs from person to person. It may:  Start suddenly (acute), or it may develop slowly and last for a long time (chronic).  Come and go as damaged nerves heal, or it may stay at the same level for years.  Cause emotional distress, loss of sleep, and a lower quality of life. What are the causes? The most common cause of this condition is diabetes. Many other diseases  and conditions can also cause neuropathic pain. Causes of neuropathic pain can be classified as:  Toxic. This is caused by medicines and chemicals. The most common cause of toxic neuropathic pain is damage from cancer treatments (chemotherapy).  Metabolic. This can be caused by: ? Diabetes. This is the most common disease that damages the nerves. ? Lack of vitamin B from long-term alcohol abuse.  Traumatic. Any injury that cuts, crushes, or stretches a nerve can cause damage and pain. A common example is feeling pain after losing an arm or leg (phantom limb pain).  Compression-related. If a sensory nerve gets trapped or compressed for a long period of time, the blood supply to the nerve can be cut off.  Vascular. Many blood vessel diseases can cause neuropathic pain by decreasing blood supply and oxygen to nerves.  Autoimmune. This type of pain results from diseases in which the body's defense system (immune system) mistakenly attacks sensory nerves. Examples of autoimmune diseases that can cause neuropathic pain include lupus and multiple sclerosis.  Infectious. Many types of viral infections can damage sensory nerves and cause pain. Shingles infection is a common cause of this type of pain.  Inherited. Neuropathic pain can be a symptom of many diseases that are passed down through families (genetic). What increases the risk? You are more likely to develop this condition if:  You have diabetes.  You smoke.  You drink too much alcohol.  You are taking certain medicines, including  medicines that kill cancer cells (chemotherapy) or that treat immune system disorders. What are the signs or symptoms? The main symptom is pain. Neuropathic pain is often described as:  Burning.  Shock-like.  Stinging.  Hot or cold.  Itching. How is this diagnosed? No single test can diagnose neuropathic pain. It is diagnosed based on:  Physical exam and your symptoms. Your health care provider  will ask you about your pain. You may be asked to use a pain scale to describe how bad your pain is.  Tests. These may be done to see if you have a high sensitivity to pain and to help find the cause and location of any sensory nerve damage. They include: ? Nerve conduction studies to test how well nerve signals travel through your sensory nerves (electrodiagnostic testing). ? Stimulating your sensory nerves through electrodes on your skin and measuring the response in your spinal cord and brain (somatosensory evoked potential).  Imaging studies, such as: ? X-rays. ? CT scan. ? MRI. How is this treated? Treatment for neuropathic pain may change over time. You may need to try different treatment options or a combination of treatments. Some options include:  Treating the underlying cause of the neuropathy, such as diabetes, kidney disease, or vitamin deficiencies.  Stopping medicines that can cause neuropathy, such as chemotherapy.  Medicine to relieve pain. Medicines may include: ? Prescription or over-the-counter pain medicine. ? Anti-seizure medicine. ? Antidepressant medicines. ? Pain-relieving patches that are applied to painful areas of skin. ? A medicine to numb the area (local anesthetic), which can be injected as a nerve block.  Transcutaneous nerve stimulation. This uses electrical currents to block painful nerve signals. The treatment is painless.  Alternative treatments, such as: ? Acupuncture. ? Meditation. ? Massage. ? Physical therapy. ? Pain management programs. ? Counseling. Follow these instructions at home: Medicines   Take over-the-counter and prescription medicines only as told by your health care provider.  Do not drive or use heavy machinery while taking prescription pain medicine.  If you are taking prescription pain medicine, take actions to prevent or treat constipation. Your health care provider may recommend that you: ? Drink enough fluid to keep  your urine pale yellow. ? Eat foods that are high in fiber, such as fresh fruits and vegetables, whole grains, and beans. ? Limit foods that are high in fat and processed sugars, such as fried or sweet foods. ? Take an over-the-counter or prescription medicine for constipation. Lifestyle   Have a good support system at home.  Consider joining a chronic pain support group.  Do not use any products that contain nicotine or tobacco, such as cigarettes and e-cigarettes. If you need help quitting, ask your health care provider.  Do not drink alcohol. General instructions  Learn as much as you can about your condition.  Work closely with all your health care providers to find the treatment plan that works best for you.  Ask your health care provider what activities are safe for you.  Keep all follow-up visits as told by your health care provider. This is important. Contact a health care provider if:  Your pain treatments are not working.  You are having side effects from your medicines.  You are struggling with tiredness (fatigue), mood changes, depression, or anxiety. Summary  Neuropathic pain is pain caused by damage to the nerves that are responsible for certain sensations in your body (sensory nerves).  Neuropathic pain may come and go as damaged nerves heal, or  it may stay at the same level for years.  Neuropathic pain is usually a long-term condition that can be difficult to treat. Consider joining a chronic pain support group. This information is not intended to replace advice given to you by your health care provider. Make sure you discuss any questions you have with your health care provider. Document Revised: 08/21/2018 Document Reviewed: 05/17/2017 Elsevier Patient Education  [place].

## 2020-02-23 NOTE — Progress Notes (Signed)
Established Patient Office Visit  Subjective:  Patient ID: Kimberly [name], female    DOB: [birth year]  Age: 69 y.o. MRN: [id number]  CC:  Chief Complaint  Patient presents with  . Foot Problem    burning in the left foot for 2 yrs    HPI [name] presents for severe burning in her left foot especially at night.  She has been dealing with this for over 2 years now.  She has recently seen back specialist and had MRI of the spine.  She had epidural injections of the back which have not helped.  She apparently has been scheduled for EMGs.  She previously took gabapentin but apparently only took low-dose that did not help.  Pain is very disruptive to sleep at night.  She described this as a burning type discomfort.  She has no pain whatsoever with walking.  No visible swelling or redness.  Interfering with sleep.  No classic radiculitis type symptoms.   She does not have any history of diabetes but has hx of prediabetes range blood sugars.  Last A1c was 6.4% and that was last August.  Past Medical History:  Diagnosis Date  . Anemia 2005   hb 11.5  . Arthritis   . DVT (deep venous thrombosis) ([place])    2003, coumadin x 6 mon  . GERD (gastroesophageal reflux disease) 2001   no problem now  . Hypertension   . Hypothyroidism    off meds  . Neuromuscular disorder ([place])    painful in L foot, being started on Gabapentin- 11/08/2016  . Osteopenia   . Vegetarian diet    eats fish and eggs; no meat    Past Surgical History:  Procedure Laterality Date  . JOINT REPLACEMENT    . KNEE ARTHROSCOPY Right 03/16/2003  . OOPHORECTOMY     "think it was my left"  . TOTAL KNEE ARTHROPLASTY Right 09/14/2014   Procedure: RIGHT TOTAL KNEE ARTHROPLASTY;  Surgeon: Garald Balding, MD;  Location: [facility];  Service: Orthopedics;  Laterality: Right;  . TOTAL KNEE ARTHROPLASTY Left 11/20/2016  . TOTAL KNEE ARTHROPLASTY Left 11/20/2016   Procedure: LEFT TOTAL KNEE ARTHROPLASTY;  Surgeon: Garald Balding, MD;   Location: [facility];  Service: Orthopedics;  Laterality: Left;    Family History  Problem Relation Age of Onset  . Diabetes Mother   . Heart failure Father   . Diabetes Other   . Alcohol abuse Other     Social History   Socioeconomic History  . Marital status: Married    Spouse name: Not on file  . Number of children: Not on file  . Years of education: Not on file  . Highest education level: Not on file  Occupational History  . Not on file  Tobacco Use  . Smoking status: Never Smoker  . Smokeless tobacco: Never Used  Vaping Use  . Vaping Use: Never used  Substance and Sexual Activity  . Alcohol use: No  . Drug use: No  . Sexual activity: Not on file  Other Topics Concern  . Not on file  Social History Narrative   Married   Never smoker   Alcohol use-no   Regular exercise- yes. Walks regularly   Brother-alcohol   Illicit drug- no   Daily Caffeine use- 2 cups of tea daily   Social Determinants of Health   Financial Resource Strain:   . Difficulty of Paying Living Expenses: Not on file  Food Insecurity:   . Worried About  Running Out of Food in the Last Year: Not on file  . Ran Out of Food in the Last Year: Not on file  Transportation Needs:   . Lack of Transportation (Medical): Not on file  . Lack of Transportation (Non-Medical): Not on file  Physical Activity:   . Days of Exercise per Week: Not on file  . Minutes of Exercise per Session: Not on file  Stress:   . Feeling of Stress : Not on file  Social Connections:   . Frequency of Communication with Friends and Family: Not on file  . Frequency of Social Gatherings with Friends and Family: Not on file  . Attends Religious Services: Not on file  . Active Member of Clubs or Organizations: Not on file  . Attends Archivist Meetings: Not on file  . Marital Status: Not on file  Intimate Partner Violence:   . Fear of Current or Ex-Partner: Not on file  . Emotionally Abused: Not on file  . Physically  Abused: Not on file  . Sexually Abused: Not on file    Outpatient Medications Prior to Visit  Medication Sig Dispense Refill  . acetaminophen (TYLENOL) 500 MG tablet Take 1,000 mg by mouth daily as needed for moderate pain.    Marland Kitchen amoxicillin (AMOXIL) 500 MG capsule TAKE 4 CAPSULES 1 HOUR PRIOR TO DENTAL PROCEDURE 12 capsule 1  . Calcium Carbonate (CALCIUM 600 PO) Take 600 mg by mouth daily.    . cholecalciferol (VITAMIN D) 1000 units tablet Take 1,000 Units by mouth daily.    . diclofenac sodium (VOLTAREN) 1 % GEL Apply 4 g 3 (three) times daily as needed topically. 3 Tube 3  . irbesartan (AVAPRO) 300 MG tablet Take 1 tablet (300 mg total) by mouth daily. 90 tablet 3  . levothyroxine (SYNTHROID) 50 MCG tablet TAKE 1 TABLET BY MOUTH EVERY DAY BEFORE BREAKFAST 90 tablet 0  . Magnesium 200 MG TABS Take 1 tablet by mouth daily.    Marland Kitchen triamcinolone cream (KENALOG) 0.1 % Apply 1 application topically 2 (two) times daily. 30 g 1   No facility-administered medications prior to visit.    Allergies  Allergen Reactions  . Enoxaparin Sodium Itching and Swelling    SWELLING REACTION UNSPECIFIED     ROS Review of Systems  Constitutional: Negative for fatigue.  Eyes: Negative for visual disturbance.  Respiratory: Negative for cough, chest tightness, shortness of breath and wheezing.   Cardiovascular: Negative for chest pain, palpitations and leg swelling.  Neurological: Negative for dizziness, seizures, syncope, weakness, light-headedness and headaches.      Objective:    Physical Exam Vitals reviewed.  Constitutional:      Appearance: Normal appearance.  Cardiovascular:     Rate and Rhythm: Normal rate and regular rhythm.  Pulmonary:     Effort: Pulmonary effort is normal.     Breath sounds: Normal breath sounds.  Musculoskeletal:     Comments: Left foot reveals no swelling.  No bony tenderness.  Good capillary refill.  She has good distal pulses  Neurological:     Mental Status: She  is alert.     BP 116/72   Pulse 81   Temp 98.4 F (36.9 C)   Ht 5' 0.5" (1.537 m)   Wt 172 lb (78 kg)   LMP 05/14/1998   SpO2 97%   BMI 33.04 kg/m  Wt Readings from Last 3 Encounters:  02/23/20 172 lb (78 kg)  04/15/19 171 lb 9.6 oz (77.8 kg)  03/31/19 172 lb 3.2 oz (78.1 kg)     Health Maintenance Due  Topic Date Due  . TETANUS/TDAP  02/11/2017  . INFLUENZA VACCINE  12/13/2019  . COLONOSCOPY  02/11/2020    There are no preventive care reminders to display for this patient.  Lab Results  Component Value Date   TSH 2.06 12/26/2018   Lab Results  Component Value Date   WBC 6.5 07/29/2019   HGB 12.5 07/29/2019   HCT 36 07/29/2019   MCV 92.3 12/18/2016   PLT 266 07/29/2019   Lab Results  Component Value Date   NA 136 (A) 07/29/2019   K 4.1 07/29/2019   CO2 23 (A) 07/29/2019   GLUCOSE 158 (H) 12/26/2018   BUN 12 07/29/2019   CREATININE 0.6 07/29/2019   BILITOT 0.8 12/26/2018   ALKPHOS 69 07/29/2019   AST 27 07/29/2019   ALT 25 07/29/2019   PROT 7.1 12/26/2018   ALBUMIN 4.1 07/29/2019   CALCIUM 9.5 07/29/2019   ANIONGAP 6 11/23/2016   GFR 98.78 12/26/2018   Lab Results  Component Value Date   CHOL 166 03/27/2016   Lab Results  Component Value Date   HDL 47.40 03/27/2016   Lab Results  Component Value Date   LDLCALC 104 (H) 03/27/2016   Lab Results  Component Value Date   TRIG 75.0 03/27/2016   Lab Results  Component Value Date   CHOLHDL 4 03/27/2016   Lab Results  Component Value Date   HGBA1C 6.4 12/26/2018      Assessment & Plan:   Left foot pain.  This sounds very much neuropathic in origin and occurs with rest and is burning quality and she has no pain with ambulation.  -Recommend trial of Lyrica and she will start 75 mg nightly.  May increase to twice daily if necessary but currently she is having very little daytime pain Reviewed possible side effects with Lyrica  -Recommend follow-up in a few weeks to reassess.  She needs  follow-up labs then will include TSH, A1c, chemistries  Meds ordered this encounter  Medications  . pregabalin (LYRICA) 75 MG capsule    Sig: Take 1 capsule (75 mg total) by mouth 2 (two) times daily.    Dispense:  60 capsule    Refill:  1    Follow-up: Return in about 3 weeks (around 03/15/2020).    Carolann Littler, MD

## 2020-02-24 MED ORDER — PREGABALIN 75 MG PO CAPS: 75.0000 mg | ORAL_CAPSULE | Freq: Two times a day (BID) | ORAL | 1 refills | Status: DC

## 2020-02-24 NOTE — Addendum Note (Signed)
Addended by: Amalia Hailey on: 02/24/2020 01:07 PM   Modules accepted: Orders

## 2020-02-25 ENCOUNTER — Telehealth: Payer: Self-pay | Admitting: Family Medicine

## 2020-02-25 DIAGNOSIS — M79672 Pain in left foot: Secondary | ICD-10-CM

## 2020-02-25 MED ORDER — PREGABALIN 75 MG PO CAPS: 75.0000 mg | ORAL_CAPSULE | Freq: Two times a day (BID) | ORAL | 1 refills | Status: DC

## 2020-02-25 NOTE — Telephone Encounter (Signed)
Patient informed of the message below.

## 2020-02-25 NOTE — Telephone Encounter (Signed)
I sent this again to pharmacy electronically.  Do you know any reason it cannot be sent that way?   I know Lyrica is controlled but we send many other controlled meds through computer.  Hopefully went through this time.

## 2020-02-25 NOTE — Telephone Encounter (Signed)
Pt is calling in stating that she was told the the Rx pregabalin (LYRICA) 75 MG.  Pharm:  [facility] Oppenheim at [facility].  Pt stated that the Rx can not be called in and if it can not be sent electronically she would like to be called so that she can come by and get a hard copy.

## 2020-02-26 ENCOUNTER — Ambulatory Visit: Payer: Medicare Other | Admitting: Family Medicine

## 2020-03-25 ENCOUNTER — Other Ambulatory Visit: Payer: Self-pay | Admitting: Family Medicine

## 2020-03-25 DIAGNOSIS — Z1231 Encounter for screening mammogram for malignant neoplasm of breast: Secondary | ICD-10-CM

## 2020-03-27 ENCOUNTER — Other Ambulatory Visit: Payer: Self-pay | Admitting: Family Medicine

## 2020-03-28 ENCOUNTER — Encounter: Payer: Self-pay | Admitting: Family Medicine

## 2020-03-28 ENCOUNTER — Other Ambulatory Visit: Payer: Self-pay

## 2020-03-28 ENCOUNTER — Ambulatory Visit: Payer: Medicare Other | Admitting: Neurology

## 2020-03-28 ENCOUNTER — Ambulatory Visit (INDEPENDENT_AMBULATORY_CARE_PROVIDER_SITE_OTHER): Payer: Medicare Other | Admitting: Family Medicine

## 2020-03-28 VITALS — BP 132/78 | HR 88 | Ht 60.5 in | Wt 173.0 lb

## 2020-03-28 DIAGNOSIS — I10 Essential (primary) hypertension: Secondary | ICD-10-CM | POA: Diagnosis not present

## 2020-03-28 DIAGNOSIS — E039 Hypothyroidism, unspecified: Secondary | ICD-10-CM

## 2020-03-28 DIAGNOSIS — R7303 Prediabetes: Secondary | ICD-10-CM | POA: Diagnosis not present

## 2020-03-28 DIAGNOSIS — M79672 Pain in left foot: Secondary | ICD-10-CM | POA: Diagnosis not present

## 2020-03-28 DIAGNOSIS — E559 Vitamin D deficiency, unspecified: Secondary | ICD-10-CM | POA: Diagnosis not present

## 2020-03-28 DIAGNOSIS — R7309 Other abnormal glucose: Secondary | ICD-10-CM | POA: Diagnosis not present

## 2020-03-28 MED ORDER — PREGABALIN 75 MG PO CAPS: 75.0000 mg | ORAL_CAPSULE | Freq: Two times a day (BID) | ORAL | 5 refills | Status: DC

## 2020-03-28 NOTE — Telephone Encounter (Signed)
Lab appointment 03/28/20

## 2020-03-28 NOTE — Progress Notes (Signed)
Established Patient Office Visit  Subjective:  Patient ID: Kimberly [name], female    DOB: [birth year]  Age: 69 y.o. MRN: [id number]  CC:  Chief Complaint  Patient presents with  . Follow-up    HPI Kimberly [name] presents for medical follow-up.  She has some recent neuropathic pains in her left foot.  This was occurring in the dorsum of the foot and mostly at night.  She started Lyrica 75 mg at night and saw immediate improvement.  She tried taking this briefly twice daily but had some daytime grogginess and stop the daytime dose.  She seems to be controlled fairly well currently with just using at night.  She has history of prediabetes range blood sugars.  A1c last year was 6.4%.  No polyuria or polydipsia.  She has hypothyroidism treated with levothyroxine.  Due for follow-up thyroid functions.  She has hypertension treated with irbesartan 300 mg daily.  No recent dizziness.  Blood pressures been stable  She has history of vitamin D deficiency.  She does not take vitamin D regularly.  Past Medical History:  Diagnosis Date  . Anemia 2005   hb 11.5  . Arthritis   . DVT (deep venous thrombosis) ([place])    2003, coumadin x 6 mon  . GERD (gastroesophageal reflux disease) 2001   no problem now  . Hypertension   . Hypothyroidism    off meds  . Neuromuscular disorder ([place])    painful in L foot, being started on Gabapentin- 11/08/2016  . Osteopenia   . Vegetarian diet    eats fish and eggs; no meat    Past Surgical History:  Procedure Laterality Date  . JOINT REPLACEMENT    . KNEE ARTHROSCOPY Right 03/16/2003  . OOPHORECTOMY     "think it was my left"  . TOTAL KNEE ARTHROPLASTY Right 09/14/2014   Procedure: RIGHT TOTAL KNEE ARTHROPLASTY;  Surgeon: Garald Balding, MD;  Location: [facility];  Service: Orthopedics;  Laterality: Right;  . TOTAL KNEE ARTHROPLASTY Left 11/20/2016  . TOTAL KNEE ARTHROPLASTY Left 11/20/2016   Procedure: LEFT TOTAL KNEE ARTHROPLASTY;  Surgeon: Garald Balding,  MD;  Location: [facility];  Service: Orthopedics;  Laterality: Left;    Family History  Problem Relation Age of Onset  . Diabetes Mother   . Heart failure Father   . Diabetes Other   . Alcohol abuse Other     Social History   Socioeconomic History  . Marital status: Married    Spouse name: Not on file  . Number of children: Not on file  . Years of education: Not on file  . Highest education level: Not on file  Occupational History  . Not on file  Tobacco Use  . Smoking status: Never Smoker  . Smokeless tobacco: Never Used  Vaping Use  . Vaping Use: Never used  Substance and Sexual Activity  . Alcohol use: No  . Drug use: No  . Sexual activity: Not on file  Other Topics Concern  . Not on file  Social History Narrative   Married   Never smoker   Alcohol use-no   Regular exercise- yes. Walks regularly   Brother-alcohol   Illicit drug- no   Daily Caffeine use- 2 cups of tea daily   Social Determinants of Health   Financial Resource Strain:   . Difficulty of Paying Living Expenses: Not on file  Food Insecurity:   . Worried About Charity fundraiser in the Last Year: Not on file  .  Ran Out of Food in the Last Year: Not on file  Transportation Needs:   . Lack of Transportation (Medical): Not on file  . Lack of Transportation (Non-Medical): Not on file  Physical Activity:   . Days of Exercise per Week: Not on file  . Minutes of Exercise per Session: Not on file  Stress:   . Feeling of Stress : Not on file  Social Connections:   . Frequency of Communication with Friends and Family: Not on file  . Frequency of Social Gatherings with Friends and Family: Not on file  . Attends Religious Services: Not on file  . Active Member of Clubs or Organizations: Not on file  . Attends Archivist Meetings: Not on file  . Marital Status: Not on file  Intimate Partner Violence:   . Fear of Current or Ex-Partner: Not on file  . Emotionally Abused: Not on file  . Physically  Abused: Not on file  . Sexually Abused: Not on file    Outpatient Medications Prior to Visit  Medication Sig Dispense Refill  . acetaminophen (TYLENOL) 500 MG tablet Take 1,000 mg by mouth daily as needed for moderate pain.    Marland Kitchen amoxicillin (AMOXIL) 500 MG capsule TAKE 4 CAPSULES 1 HOUR PRIOR TO DENTAL PROCEDURE 12 capsule 1  . Calcium Carbonate (CALCIUM 600 PO) Take 600 mg by mouth daily.    . cholecalciferol (VITAMIN D) 1000 units tablet Take 1,000 Units by mouth daily.    . diclofenac sodium (VOLTAREN) 1 % GEL Apply 4 g 3 (three) times daily as needed topically. 3 Tube 3  . irbesartan (AVAPRO) 300 MG tablet Take 1 tablet (300 mg total) by mouth daily. 90 tablet 3  . levothyroxine (SYNTHROID) 50 MCG tablet TAKE 1 TABLET BY MOUTH EVERY DAY BEFORE BREAKFAST 90 tablet 0  . Magnesium 200 MG TABS Take 1 tablet by mouth daily.    Marland Kitchen triamcinolone cream (KENALOG) 0.1 % Apply 1 application topically 2 (two) times daily. 30 g 1  . pregabalin (LYRICA) 75 MG capsule Take 1 capsule (75 mg total) by mouth 2 (two) times daily. 60 capsule 1   No facility-administered medications prior to visit.    Allergies  Allergen Reactions  . Enoxaparin Sodium Itching and Swelling    SWELLING REACTION UNSPECIFIED     ROS Review of Systems  Constitutional: Negative for fatigue and unexpected weight change.  Eyes: Negative for visual disturbance.  Respiratory: Negative for cough, chest tightness, shortness of breath and wheezing.   Cardiovascular: Negative for chest pain, palpitations and leg swelling.  Endocrine: Negative for polydipsia and polyuria.  Neurological: Negative for dizziness, seizures, syncope, weakness, light-headedness and headaches.      Objective:    Physical Exam Constitutional:      Appearance: She is well-developed.  Eyes:     Pupils: Pupils are equal, round, and reactive to light.  Neck:     Thyroid: No thyromegaly.     Vascular: No JVD.  Cardiovascular:     Rate and Rhythm:  Normal rate and regular rhythm.     Heart sounds: No gallop.   Pulmonary:     Effort: Pulmonary effort is normal. No respiratory distress.     Breath sounds: Normal breath sounds. No wheezing or rales.  Musculoskeletal:     Cervical back: Neck supple.     Right lower leg: No edema.     Left lower leg: No edema.  Neurological:     Mental Status: She is  alert.     BP 132/78   Pulse 88   Ht 5' 0.5" (1.537 m)   Wt 173 lb (78.5 kg)   LMP 05/14/1998   SpO2 98%   BMI 33.23 kg/m  Wt Readings from Last 3 Encounters:  03/28/20 173 lb (78.5 kg)  02/23/20 172 lb (78 kg)  04/15/19 171 lb 9.6 oz (77.8 kg)     Health Maintenance Due  Topic Date Due  . TETANUS/TDAP  02/11/2017  . INFLUENZA VACCINE  12/13/2019  . COLONOSCOPY  02/11/2020    There are no preventive care reminders to display for this patient.  Lab Results  Component Value Date   TSH 2.06 12/26/2018   Lab Results  Component Value Date   WBC 6.5 07/29/2019   HGB 12.5 07/29/2019   HCT 36 07/29/2019   MCV 92.3 12/18/2016   PLT 266 07/29/2019   Lab Results  Component Value Date   NA 136 (A) 07/29/2019   K 4.1 07/29/2019   CO2 23 (A) 07/29/2019   GLUCOSE 158 (H) 12/26/2018   BUN 12 07/29/2019   CREATININE 0.6 07/29/2019   BILITOT 0.8 12/26/2018   ALKPHOS 69 07/29/2019   AST 27 07/29/2019   ALT 25 07/29/2019   PROT 7.1 12/26/2018   ALBUMIN 4.1 07/29/2019   CALCIUM 9.5 07/29/2019   ANIONGAP 6 11/23/2016   GFR 98.78 12/26/2018   Lab Results  Component Value Date   CHOL 166 03/27/2016   Lab Results  Component Value Date   HDL 47.40 03/27/2016   Lab Results  Component Value Date   LDLCALC 104 (H) 03/27/2016   Lab Results  Component Value Date   TRIG 75.0 03/27/2016   Lab Results  Component Value Date   CHOLHDL 4 03/27/2016   Lab Results  Component Value Date   HGBA1C 6.4 12/26/2018      Assessment & Plan:   #1 neuropathic pain left foot improved on Lyrica.  Currently getting good  control is taking just 75 mg Lyrica at night alone.  She does have EMG study scheduled -Refilled Lyrica for 6 months  #2 history of prediabetes -Recheck A1c -Continue low glycemic diet  #3 hypothyroidism currently treated with levothyroxine 50 mcg daily -Recheck TSH  #4 hypertension currently stable on Avapro 300 mg daily -Continue current medication  #5 history of vitamin D deficiency -Recheck 25 hydroxy vitamin D  Meds ordered this encounter  Medications  . pregabalin (LYRICA) 75 MG capsule    Sig: Take 1 capsule (75 mg total) by mouth 2 (two) times daily.    Dispense:  60 capsule    Refill:  5    Follow-up: Return in about 6 months (around 09/25/2020).    Carolann Littler, MD

## 2020-03-29 ENCOUNTER — Encounter: Payer: Self-pay | Admitting: *Deleted

## 2020-03-29 ENCOUNTER — Telehealth: Payer: Self-pay

## 2020-03-29 ENCOUNTER — Other Ambulatory Visit: Payer: Self-pay | Admitting: Family Medicine

## 2020-03-29 LAB — HEMOGLOBIN A1C
Hgb A1c MFr Bld: 6.7 % of total Hgb — ABNORMAL HIGH (ref <5.7)
Mean Plasma Glucose: 146 (calc)
eAG (mmol/L): 8.1 (calc)

## 2020-03-29 LAB — BASIC METABOLIC PANEL
BUN/Creatinine Ratio: 22 (calc) (ref 6–22)
BUN: 11 mg/dL (ref 7–25)
CO2: 27 mmol/L (ref 20–32)
Calcium: 10.2 mg/dL (ref 8.6–10.4)
Chloride: 102 mmol/L (ref 98–110)
Creat: 0.5 mg/dL — ABNORMAL LOW (ref 0.60–0.93)
Glucose, Bld: 107 mg/dL — ABNORMAL HIGH (ref 65–99)
Potassium: 4.7 mmol/L (ref 3.5–5.3)
Sodium: 138 mmol/L (ref 135–146)

## 2020-03-29 LAB — VITAMIN D 25 HYDROXY (VIT D DEFICIENCY, FRACTURES): Vit D, 25-Hydroxy: 35 ng/mL (ref 30–100)

## 2020-03-29 LAB — TSH: TSH: 2.83 mIU/L (ref 0.40–4.50)

## 2020-03-29 NOTE — Telephone Encounter (Signed)
Patient aware of results and recommendations. °

## 2020-03-29 NOTE — Telephone Encounter (Signed)
-----   Message from Eulas Post, MD sent at 03/29/2020 12:50 PM EST ----- Vit D normal range.  A1C is up slightly to 6.7%.  thyroid normal.  Kidney function and electrolytes stable.  Keep sugar and white starch consumption low.  Recommend 61-month follow-up and recheck A1c.  If not improved at that time will consider medication options to get further to goal

## 2020-03-30 ENCOUNTER — Encounter: Payer: Self-pay | Admitting: Diagnostic Neuroimaging

## 2020-03-30 ENCOUNTER — Ambulatory Visit (INDEPENDENT_AMBULATORY_CARE_PROVIDER_SITE_OTHER): Payer: Medicare Other | Admitting: Diagnostic Neuroimaging

## 2020-03-30 VITALS — BP 160/84 | HR 64 | Ht 61.0 in | Wt 175.2 lb

## 2020-03-30 DIAGNOSIS — Z96652 Presence of left artificial knee joint: Secondary | ICD-10-CM

## 2020-03-30 DIAGNOSIS — R7303 Prediabetes: Secondary | ICD-10-CM | POA: Diagnosis not present

## 2020-03-30 DIAGNOSIS — M79672 Pain in left foot: Secondary | ICD-10-CM | POA: Diagnosis not present

## 2020-03-30 DIAGNOSIS — E538 Deficiency of other specified B group vitamins: Secondary | ICD-10-CM | POA: Diagnosis not present

## 2020-03-30 DIAGNOSIS — M48062 Spinal stenosis, lumbar region with neurogenic claudication: Secondary | ICD-10-CM

## 2020-03-30 NOTE — Patient Instructions (Signed)
LEFT FOOT BURNING (likely related to lumbar spinal stenosis, left L4-5 foraminal stenosis, history of left knee surgery, and borderline diabetes)  - consider PT evaluation for lumbar spinal stenosis, left knee tightness, hip flexor weakness - consider lumbar spine surgery evaluation - continue lyrica; consider capsaicin cream, lidocaine patch / cream

## 2020-03-30 NOTE — Progress Notes (Signed)
[facility]  PATIENT: Kimberly [name] DOB: 17-Sep-1950  REFERRING CLINICIAN: Eulas Post, MD HISTORY FROM: patient  REASON FOR VISIT: new consult    HISTORICAL  CHIEF COMPLAINT:  Chief Complaint  Patient presents with  . Spondylosis with radiculoapthy,  lumbar region    rm 7 New Pt husband- Sat "left foot burning x 1 month, Lyrica helps; have had MRI lumbar spine"    HISTORY OF PRESENT ILLNESS:   69 year old female here for evaluation of left foot burning sensation.  Symptoms started around 2017.  She was having left ankle pain, found to have some cystic and bone changes in the left ankle, treated with injections, without relief.  She also tried compression with ankle wraps at that time without relief.  Symptoms progressed over time.  She also had left knee surgery in 2018 and following the surgery her left foot symptoms worsened.  Patient was also having low back pain radiating to left buttock and left leg around that time, had MRI in 2017 which showed moderate to severe spinal stenosis at L2-3.  Patient also has borderline diabetes on medical management.  Patient has tried epidural steroid injection without relief.  She has tried Lyrica without relief.  Patient able to walk about 2 to 3 miles fairly well but does have some discomfort at the end of her walk.  She has some pain and discomfort when she bends over and lifts things.  Her main discomfort is in the top of her left foot.  She has some low back pain.    REVIEW OF SYSTEMS: Full 14 system review of systems performed and negative with exception of: As per HPI.  ALLERGIES: Allergies  Allergen Reactions  . Enoxaparin Sodium Itching and Swelling    SWELLING REACTION UNSPECIFIED     HOME MEDICATIONS: Outpatient Medications Prior to Visit  Medication Sig Dispense Refill  . acetaminophen (TYLENOL) 500 MG tablet Take 1,000 mg by mouth daily as needed for moderate pain.    . Calcium Carbonate (CALCIUM  600 PO) Take 600 mg by mouth daily.    . cholecalciferol (VITAMIN D) 1000 units tablet Take 1,000 Units by mouth daily.    . diclofenac sodium (VOLTAREN) 1 % GEL Apply 4 g 3 (three) times daily as needed topically. 3 Tube 3  . irbesartan (AVAPRO) 300 MG tablet TAKE 1 TABLET BY MOUTH EVERY DAY 90 tablet 3  . levothyroxine (SYNTHROID) 50 MCG tablet TAKE 1 TABLET BY MOUTH EVERY DAY BEFORE BREAKFAST 90 tablet 3  . Magnesium 200 MG TABS Take 1 tablet by mouth daily.    . pregabalin (LYRICA) 75 MG capsule Take 1 capsule (75 mg total) by mouth 2 (two) times daily. 60 capsule 5  . triamcinolone cream (KENALOG) 0.1 % Apply 1 application topically 2 (two) times daily. 30 g 1  . amoxicillin (AMOXIL) 500 MG capsule TAKE 4 CAPSULES 1 HOUR PRIOR TO DENTAL PROCEDURE (Patient not taking: Reported on 03/30/2020) 12 capsule 1   No facility-administered medications prior to visit.    PAST MEDICAL HISTORY: Past Medical History:  Diagnosis Date  . Anemia 2005   hb 11.5  . Arthritis   . DVT (deep venous thrombosis) ([place])    2003, coumadin x 6 mon  . GERD (gastroesophageal reflux disease) 2001   no problem now  . Hypertension   . Hypothyroidism    off meds  . Neuromuscular disorder ([place])    painful in L foot, being started on Gabapentin- 11/08/2016  .  Osteopenia   . Vegetarian diet    eats fish and eggs; no meat    PAST SURGICAL HISTORY: Past Surgical History:  Procedure Laterality Date  . JOINT REPLACEMENT    . KNEE ARTHROSCOPY Right 03/16/2003  . OOPHORECTOMY     "think it was my left"  . TOTAL KNEE ARTHROPLASTY Right 09/14/2014   Procedure: RIGHT TOTAL KNEE ARTHROPLASTY;  Surgeon: Garald Balding, MD;  Location: [facility];  Service: Orthopedics;  Laterality: Right;  . TOTAL KNEE ARTHROPLASTY Left 11/20/2016  . TOTAL KNEE ARTHROPLASTY Left 11/20/2016   Procedure: LEFT TOTAL KNEE ARTHROPLASTY;  Surgeon: Garald Balding, MD;  Location: [facility];  Service: Orthopedics;  Laterality: Left;    FAMILY  HISTORY: Family History  Problem Relation Age of Onset  . Diabetes Mother   . Heart failure Father   . Diabetes Other   . Alcohol abuse Other     SOCIAL HISTORY: Social History   Socioeconomic History  . Marital status: Married    Spouse name: Not on file  . Number of children: 2  . Years of education: Not on file  . Highest education level: Master's degree (e.g., MA, MS, MEng, MEd, MSW, MBA)  Occupational History    Comment: retired  Tobacco Use  . Smoking status: Never Smoker  . Smokeless tobacco: Never Used  Vaping Use  . Vaping Use: Never used  Substance and Sexual Activity  . Alcohol use: No  . Drug use: No  . Sexual activity: Not on file  Other Topics Concern  . Not on file  Social History Narrative   Married   Never smoker   Alcohol use-no   Regular exercise- yes. Walks regularly   Brother-alcohol   Illicit drug- no   Daily Caffeine use- 4 cups of tea daily   Social Determinants of Health   Financial Resource Strain:   . Difficulty of Paying Living Expenses: Not on file  Food Insecurity:   . Worried About Charity fundraiser in the Last Year: Not on file  . Ran Out of Food in the Last Year: Not on file  Transportation Needs:   . Lack of Transportation (Medical): Not on file  . Lack of Transportation (Non-Medical): Not on file  Physical Activity:   . Days of Exercise per Week: Not on file  . Minutes of Exercise per Session: Not on file  Stress:   . Feeling of Stress : Not on file  Social Connections:   . Frequency of Communication with Friends and Family: Not on file  . Frequency of Social Gatherings with Friends and Family: Not on file  . Attends Religious Services: Not on file  . Active Member of Clubs or Organizations: Not on file  . Attends Archivist Meetings: Not on file  . Marital Status: Not on file  Intimate Partner Violence:   . Fear of Current or Ex-Partner: Not on file  . Emotionally Abused: Not on file  . Physically Abused:  Not on file  . Sexually Abused: Not on file     PHYSICAL EXAM  GENERAL EXAM/CONSTITUTIONAL: Vitals:  Vitals:   03/30/20 0811  BP: (!) 160/84  Pulse: 64  Weight: 175 lb 3.2 oz (79.5 kg)  Height: 5\' 1"  (1.549 m)     Body mass index is 33.1 kg/m. Wt Readings from Last 3 Encounters:  03/30/20 175 lb 3.2 oz (79.5 kg)  03/28/20 173 lb (78.5 kg)  02/23/20 172 lb (78 kg)  Patient is in no distress; well developed, nourished and groomed; neck is supple  CARDIOVASCULAR:  Examination of carotid arteries is normal; no carotid bruits  Regular rate and rhythm, no murmurs  Examination of peripheral vascular system by observation and palpation is normal  EYES:  Ophthalmoscopic exam of optic discs and posterior segments is normal; no papilledema or hemorrhages  No exam data present  MUSCULOSKELETAL:  Gait, strength, tone, movements noted in Neurologic exam below  NEUROLOGIC: MENTAL STATUS:  No flowsheet data found.  awake, alert, oriented to person, place and time  recent and remote memory intact  normal attention and concentration  language fluent, comprehension intact, naming intact  fund of knowledge appropriate  CRANIAL NERVE:   2nd - no papilledema on fundoscopic exam  2nd, 3rd, 4th, 6th - pupils equal and reactive to light, visual fields full to confrontation, extraocular muscles intact, no nystagmus  5th - facial sensation symmetric  7th - facial strength symmetric  8th - hearing intact  9th - palate elevates symmetrically, uvula midline  11th - shoulder shrug symmetric  12th - tongue protrusion midline  MOTOR:   normal bulk and tone, full strength in the BUE, BLE; EXCEPT BILATERAL HIP FLEXOR WEAKNESS (3-4/5) AND LEFT FOOT DF WEAKNESS (4+/5)  SENSORY:   normal and symmetric to light touch, pinprick, temperature, vibration; EXCEPT SLIGHTLY DECREASED IN LEFT FOOT (PLANTAR)  MILD DECR ROM IN STRAIGHT LEG RAISE TESTING; MILD DISCOMFORT IN  RIGHT BUTTOCK WITH RIGHT LEG RAISE  COORDINATION:   finger-nose-finger, fine finger movements normal  REFLEXES:   deep tendon reflexes 2+ IN BUE; 1+ IN BLE and symmetric  GAIT/STATION:   narrow based gait     DIAGNOSTIC DATA (LABS, IMAGING, TESTING) - I reviewed patient records, labs, notes, testing and imaging myself where available.  Lab Results  Component Value Date   WBC 6.5 07/29/2019   HGB 12.5 07/29/2019   HCT 36 07/29/2019   MCV 92.3 12/18/2016   PLT 266 07/29/2019      Component Value Date/Time   NA 138 03/28/2020 1105   NA 136 (A) 07/29/2019 0000   K 4.7 03/28/2020 1105   CL 102 03/28/2020 1105   CO2 27 03/28/2020 1105   GLUCOSE 107 (H) 03/28/2020 1105   GLUCOSE 94 03/20/2006 1039   BUN 11 03/28/2020 1105   BUN 12 07/29/2019 0000   CREATININE 0.50 (L) 03/28/2020 1105   CALCIUM 10.2 03/28/2020 1105   PROT 7.1 12/26/2018 1104   ALBUMIN 4.1 07/29/2019 0000   AST 27 07/29/2019 0000   ALT 25 07/29/2019 0000   ALKPHOS 69 07/29/2019 0000   BILITOT 0.8 12/26/2018 1104   GFRNONAA 107 07/29/2019 0000   GFRAA 93 07/29/2019 0000   Lab Results  Component Value Date   CHOL 166 03/27/2016   HDL 47.40 03/27/2016   LDLCALC 104 (H) 03/27/2016   LDLDIRECT 142.2 03/20/2006   TRIG 75.0 03/27/2016   CHOLHDL 4 03/27/2016   Lab Results  Component Value Date   HGBA1C 6.7 (H) 03/28/2020   Lab Results  Component Value Date   VITAMINB12 1,353 (H) 12/26/2018   Lab Results  Component Value Date   TSH 2.83 03/28/2020    01/04/16 MRI left ankle 1. Confluence of degenerative subcortical cystic lesions in the medial talar dome with some overlying articular cartilage irregularity and chondral thinning, and surrounding marrow edema. Although likely degenerative, the possibility of 1 or more of the cystic lesions representing geodes is raised as a possibility. No current fragmentation.  No tibiotalar joint effusion. 2. Mild distal tibialis posterior tendinopathy,  correlate clinically in assessing for tibialis posterior dysfunction. 3. Minimal distal Achilles tendinopathy. 4. Thickened anterior inferior tibiofibular ligament, but not discontinuous, query remote injury. The lower margin of the syndesmosis appears intact on image 1/7.  02/18/16 MRI lumbar spine [I reviewed images myself and agree with interpretation. Moderate spinal stenosis at L2-3; mild right and moderate left foraminal stenosis at L4-5. -VRP]  1. Moderate-sized broad-based disc protrusion centrally and to the right with mass effect on the thecal sac and bilateral lateral recess encroachment. 2. Short pedicles and bulging discs at L3-4 and L4-5 with mild bilateral lateral recess encroachment. These findings appear relatively stable. 3. Stable shallow broad-based central and right paracentral disc protrusion at L5-S1.  10/21/19 MRI lumbar spine [report only] Multilevel mild-to-moderate degenerative changes.  There is moderate spinal stenosis at L2-L3 due to disc bulge and facet arthropathy.  No other specific cause for a left sided radiculopathy seen.     ASSESSMENT AND PLAN  69 y.o. year old female here with:  Localization:  Ddx:  1. Left foot pain   2. Spinal stenosis of lumbar region with neurogenic claudication   3. History of left knee replacement   4. Prediabetes   5. Vitamin B 12 deficiency      PLAN:  LEFT FOOT BURNING (likely related to chronic left ankle arthritis, cystic changes + lumbar spinal stenosis + left L4-5 foraminal stenosis + history of left knee surgery; also with borderline diabetes)  - consider PT evaluation for lumbar spinal stenosis, left knee tightness, hip flexor weakness - consider lumbar spine surgery evaluation - consider left foot / ankle evaluation (prior arthritis / cystic changes) - continue lyrica; consider capsaicin cream, lidocaine patch / cream - due to multifactorial etiologies, abnl MRI lumbar spine, left knee and left ankle  issues, EMG not likely to help elucidate etiology further nor change treatment  Return for return to PCP, pending if symptoms worsen or fail to improve.    Penni Bombard, MD 45/99/7741, 4:23 AM Certified in Neurology, Neurophysiology and Neuroimaging  [facility] [address] [place], [place] [address] [phone]

## 2020-04-06 ENCOUNTER — Ambulatory Visit: Payer: Medicare Other | Admitting: Diagnostic Neuroimaging

## 2020-04-14 DIAGNOSIS — M48061 Spinal stenosis, lumbar region without neurogenic claudication: Secondary | ICD-10-CM | POA: Diagnosis not present

## 2020-04-14 DIAGNOSIS — M4726 Other spondylosis with radiculopathy, lumbar region: Secondary | ICD-10-CM | POA: Diagnosis not present

## 2020-04-26 DIAGNOSIS — M5416 Radiculopathy, lumbar region: Secondary | ICD-10-CM | POA: Diagnosis not present

## 2020-04-27 ENCOUNTER — Other Ambulatory Visit: Payer: Self-pay

## 2020-04-27 ENCOUNTER — Ambulatory Visit (INDEPENDENT_AMBULATORY_CARE_PROVIDER_SITE_OTHER): Payer: Medicare Other

## 2020-04-27 DIAGNOSIS — Z0000 Encounter for general adult medical examination without abnormal findings: Secondary | ICD-10-CM | POA: Diagnosis not present

## 2020-04-27 DIAGNOSIS — Z1211 Encounter for screening for malignant neoplasm of colon: Secondary | ICD-10-CM | POA: Diagnosis not present

## 2020-04-27 NOTE — Patient Instructions (Addendum)
Kimberly [name] , Thank you for taking time to come for your Medicare Wellness Visit. I appreciate your ongoing commitment to your health goals. Please review the following plan we discussed and let me know if I can assist you in the future.   Screening recommendations/referrals: Colonoscopy: ordered 04/27/20 Mammogram: Done 02/10/19 Bone Density: Done 12/29/18 Recommended yearly ophthalmology/optometry visit for glaucoma screening and checkup Recommended yearly dental visit for hygiene and checkup  Vaccinations: Influenza vaccine: Unfounded Pt stated she completed unable to confirm dates Pneumococcal vaccine: Up to date Tdap vaccine: Due and discusssed Shingles vaccine: Shingrix discussed. Please contact your pharmacy for coverage information.    Covid-19:Completed 1/21, 2/11, & 01/20/20  Advanced directives: Please bring a copy of your health care power of attorney and living will to the office at your convenience.  Conditions/risks identified: Reduce weight  Next appointment: Follow up in one year for your annual wellness visit    Preventive Care 65 Years and Older, Female Preventive care refers to lifestyle choices and visits with your health care provider that can promote health and wellness. What does preventive care include?  A yearly physical exam. This is also called an annual well check.  Dental exams once or twice a year.  Routine eye exams. Ask your health care provider how often you should have your eyes checked.  Personal lifestyle choices, including:  Daily care of your teeth and gums.  Regular physical activity.  Eating a healthy diet.  Avoiding tobacco and drug use.  Limiting alcohol use.  Practicing safe sex.  Taking low-dose aspirin every day.  Taking vitamin and mineral supplements as recommended by your health care provider. What happens during an annual well check? The services and screenings done by your health care provider during your annual well  check will depend on your age, overall health, lifestyle risk factors, and family history of disease. Counseling  Your health care provider may ask you questions about your:  Alcohol use.  Tobacco use.  Drug use.  Emotional well-being.  Home and relationship well-being.  Sexual activity.  Eating habits.  History of falls.  Memory and ability to understand (cognition).  Work and work Statistician.  Reproductive health. Screening  You may have the following tests or measurements:  Height, weight, and BMI.  Blood pressure.  Lipid and cholesterol levels. These may be checked every 5 years, or more frequently if you are over 89 years old.  Skin check.  Lung cancer screening. You may have this screening every year starting at age 81 if you have a 30-pack-year history of smoking and currently smoke or have quit within the past 15 years.  Fecal occult blood test (FOBT) of the stool. You may have this test every year starting at age 81.  Flexible sigmoidoscopy or colonoscopy. You may have a sigmoidoscopy every 5 years or a colonoscopy every 10 years starting at age 91.  Hepatitis C blood test.  Hepatitis B blood test.  Sexually transmitted disease (STD) testing.  Diabetes screening. This is done by checking your blood sugar (glucose) after you have not eaten for a while (fasting). You may have this done every 1-3 years.  Bone density scan. This is done to screen for osteoporosis. You may have this done starting at age 9.  Mammogram. This may be done every 1-2 years. Talk to your health care provider about how often you should have regular mammograms. Talk with your health care provider about your test results, treatment options, and if necessary, the  need for more tests. Vaccines  Your health care provider may recommend certain vaccines, such as:  Influenza vaccine. This is recommended every year.  Tetanus, diphtheria, and acellular pertussis (Tdap, Td) vaccine. You  may need a Td booster every 10 years.  Zoster vaccine. You may need this after age 79.  Pneumococcal 13-valent conjugate (PCV13) vaccine. One dose is recommended after age 29.  Pneumococcal polysaccharide (PPSV23) vaccine. One dose is recommended after age 84. Talk to your health care provider about which screenings and vaccines you need and how often you need them. This information is not intended to replace advice given to you by your health care provider. Make sure you discuss any questions you have with your health care provider. Document Released: 05/27/2015 Document Revised: 01/18/2016 Document Reviewed: 03/01/2015 Elsevier Interactive Patient Education  2017 [place] Prevention in the Home Falls can cause injuries. They can happen to people of all ages. There are many things you can do to make your home safe and to help prevent falls. What can I do on the outside of my home?  Regularly fix the edges of walkways and driveways and fix any cracks.  Remove anything that might make you trip as you walk through a door, such as a raised step or threshold.  Trim any bushes or trees on the path to your home.  Use bright outdoor lighting.  Clear any walking paths of anything that might make someone trip, such as rocks or tools.  Regularly check to see if handrails are loose or broken. Make sure that both sides of any steps have handrails.  Any raised decks and porches should have guardrails on the edges.  Have any leaves, snow, or ice cleared regularly.  Use sand or salt on walking paths during winter.  Clean up any spills in your garage right away. This includes oil or grease spills. What can I do in the bathroom?  Use night lights.  Install grab bars by the toilet and in the tub and shower. Do not use towel bars as grab bars.  Use non-skid mats or decals in the tub or shower.  If you need to sit down in the shower, use a plastic, non-slip stool.  Keep the floor  dry. Clean up any water that spills on the floor as soon as it happens.  Remove soap buildup in the tub or shower regularly.  Attach bath mats securely with double-sided non-slip rug tape.  Do not have throw rugs and other things on the floor that can make you trip. What can I do in the bedroom?  Use night lights.  Make sure that you have a light by your bed that is easy to reach.  Do not use any sheets or blankets that are too big for your bed. They should not hang down onto the floor.  Have a firm chair that has side arms. You can use this for support while you get dressed.  Do not have throw rugs and other things on the floor that can make you trip. What can I do in the kitchen?  Clean up any spills right away.  Avoid walking on wet floors.  Keep items that you use a lot in easy-to-reach places.  If you need to reach something above you, use a strong step stool that has a grab bar.  Keep electrical cords out of the way.  Do not use floor polish or wax that makes floors slippery. If you must use wax,  use non-skid floor wax.  Do not have throw rugs and other things on the floor that can make you trip. What can I do with my stairs?  Do not leave any items on the stairs.  Make sure that there are handrails on both sides of the stairs and use them. Fix handrails that are broken or loose. Make sure that handrails are as long as the stairways.  Check any carpeting to make sure that it is firmly attached to the stairs. Fix any carpet that is loose or worn.  Avoid having throw rugs at the top or bottom of the stairs. If you do have throw rugs, attach them to the floor with carpet tape.  Make sure that you have a light switch at the top of the stairs and the bottom of the stairs. If you do not have them, ask someone to add them for you. What else can I do to help prevent falls?  Wear shoes that:  Do not have high heels.  Have rubber bottoms.  Are comfortable and fit you  well.  Are closed at the toe. Do not wear sandals.  If you use a stepladder:  Make sure that it is fully opened. Do not climb a closed stepladder.  Make sure that both sides of the stepladder are locked into place.  Ask someone to hold it for you, if possible.  Clearly mark and make sure that you can see:  Any grab bars or handrails.  First and last steps.  Where the edge of each step is.  Use tools that help you move around (mobility aids) if they are needed. These include:  Canes.  Walkers.  Scooters.  Crutches.  Turn on the lights when you go into a dark area. Replace any light bulbs as soon as they burn out.  Set up your furniture so you have a clear path. Avoid moving your furniture around.  If any of your floors are uneven, fix them.  If there are any pets around you, be aware of where they are.  Review your medicines with your doctor. Some medicines can make you feel dizzy. This can increase your chance of falling. Ask your doctor what other things that you can do to help prevent falls. This information is not intended to replace advice given to you by your health care provider. Make sure you discuss any questions you have with your health care provider. Document Released: 02/24/2009 Document Revised: 10/06/2015 Document Reviewed: 06/04/2014 Elsevier Interactive Patient Education  2017 [employer].

## 2020-04-27 NOTE — Progress Notes (Signed)
Virtual Visit via Telephone Note  I connected with  Kimberly [name] on 04/27/20 at 11:00 AM EST by telephone and verified that I am speaking with the correct person using two identifiers.  Medicare Annual Wellness visit completed telephonically due to Covid-19 pandemic.   Persons participating in this call: This Health Coach and this patient.   Location: Patient: Home Provider: office   I discussed the limitations, risks, security and privacy concerns of performing an evaluation and management service by telephone and the availability of in person appointments. The patient expressed understanding and agreed to proceed.  Unable to perform video visit due to video visit attempted and failed and/or patient does not have video capability.   Some vital signs may be absent or patient reported.   Willette Brace, LPN    Subjective:   Kimberly [name] is a 69 y.o. female who presents for Medicare Annual (Subsequent) preventive examination.  Review of Systems     Cardiac Risk Factors include: advanced age (>16men, >32 women);hypertension;obesity (BMI >30kg/m2)     Objective:    There were no vitals filed for this visit. There is no height or weight on file to calculate BMI.  Advanced Directives 04/27/2020 12/17/2016 11/20/2016 11/08/2016 02/09/2016 02/28/2015 02/23/2015  Does Patient Have a Medical Advance Directive? Yes Yes Yes Yes Yes Yes Yes  Type of Advance Directive Living will [place];Living will Living will Living will [place];Living will - -  Does patient want to make changes to medical advance directive? - No - Patient declined Yes (Inpatient - patient defers changing a medical advance directive at this time) No - Patient declined No - Patient declined - -  Copy of [place] in Chart? - No - copy requested - - No - copy requested - -  Would patient like information on creating a medical advance directive? - - - - No - patient  declined information - -    Current Medications (verified) Outpatient Encounter Medications as of 04/27/2020  Medication Sig  . acetaminophen (TYLENOL) 500 MG tablet Take 1,000 mg by mouth daily as needed for moderate pain.  . Calcium Carbonate (CALCIUM 600 PO) Take 600 mg by mouth daily.  . cholecalciferol (VITAMIN D) 1000 units tablet Take 1,000 Units by mouth daily.  . diclofenac sodium (VOLTAREN) 1 % GEL Apply 4 g 3 (three) times daily as needed topically.  . irbesartan (AVAPRO) 300 MG tablet TAKE 1 TABLET BY MOUTH EVERY DAY  . levothyroxine (SYNTHROID) 50 MCG tablet TAKE 1 TABLET BY MOUTH EVERY DAY BEFORE BREAKFAST  . Magnesium 200 MG TABS Take 1 tablet by mouth daily.  . pregabalin (LYRICA) 75 MG capsule Take 1 capsule (75 mg total) by mouth 2 (two) times daily.  Marland Kitchen amoxicillin (AMOXIL) 500 MG capsule TAKE 4 CAPSULES 1 HOUR PRIOR TO DENTAL PROCEDURE (Patient not taking: No sig reported)  . [DISCONTINUED] triamcinolone cream (KENALOG) 0.1 % Apply 1 application topically 2 (two) times daily. (Patient not taking: Reported on 04/27/2020)   No facility-administered encounter medications on file as of 04/27/2020.    Allergies (verified) Enoxaparin sodium   History: Past Medical History:  Diagnosis Date  . Anemia 2005   hb 11.5  . Arthritis   . DVT (deep venous thrombosis) ([place])    2003, coumadin x 6 mon  . GERD (gastroesophageal reflux disease) 2001   no problem now  . Hypertension   . Hypothyroidism    off meds  . Neuromuscular disorder (  [place])    painful in L foot, being started on Gabapentin- 11/08/2016  . Osteopenia   . Vegetarian diet    eats fish and eggs; no meat   Past Surgical History:  Procedure Laterality Date  . JOINT REPLACEMENT    . KNEE ARTHROSCOPY Right 03/16/2003  . OOPHORECTOMY     "think it was my left"  . TOTAL KNEE ARTHROPLASTY Right 09/14/2014   Procedure: RIGHT TOTAL KNEE ARTHROPLASTY;  Surgeon: Garald Balding, MD;  Location: [facility];  Service:  Orthopedics;  Laterality: Right;  . TOTAL KNEE ARTHROPLASTY Left 11/20/2016  . TOTAL KNEE ARTHROPLASTY Left 11/20/2016   Procedure: LEFT TOTAL KNEE ARTHROPLASTY;  Surgeon: Garald Balding, MD;  Location: [facility];  Service: Orthopedics;  Laterality: Left;   Family History  Problem Relation Age of Onset  . Diabetes Mother   . Heart failure Father   . Diabetes Other   . Alcohol abuse Other    Social History   Socioeconomic History  . Marital status: Married    Spouse name: Not on file  . Number of children: 2  . Years of education: Not on file  . Highest education level: Master's degree (e.g., MA, MS, MEng, MEd, MSW, MBA)  Occupational History    Comment: retired  Tobacco Use  . Smoking status: Never Smoker  . Smokeless tobacco: Never Used  Vaping Use  . Vaping Use: Never used  Substance and Sexual Activity  . Alcohol use: No  . Drug use: No  . Sexual activity: Not on file  Other Topics Concern  . Not on file  Social History Narrative   Married   Never smoker   Alcohol use-no   Regular exercise- yes. Walks regularly   Brother-alcohol   Illicit drug- no   Daily Caffeine use- 4 cups of tea daily   Social Determinants of Health   Financial Resource Strain: Low Risk   . Difficulty of Paying Living Expenses: Not hard at all  Food Insecurity: No Food Insecurity  . Worried About Charity fundraiser in the Last Year: Never true  . Ran Out of Food in the Last Year: Never true  Transportation Needs: No Transportation Needs  . Lack of Transportation (Medical): No  . Lack of Transportation (Non-Medical): No  Physical Activity: Sufficiently Active  . Days of Exercise per Week: 5 days  . Minutes of Exercise per Session: 50 min  Stress: No Stress Concern Present  . Feeling of Stress : Not at all  Social Connections: Moderately Integrated  . Frequency of Communication with Friends and Family: More than three times a week  . Frequency of Social Gatherings with Friends and  Family: Three times a week  . Attends Religious Services: More than 4 times per year  . Active Member of Clubs or Organizations: No  . Attends Archivist Meetings: Never  . Marital Status: Married    Tobacco Counseling Counseling given: Not Answered   Clinical Intake:  Pre-visit preparation completed: Yes  Pain : No/denies pain     BMI - recorded: 33.12 Nutritional Status: BMI > 30  Obese Diabetes: No  How often do you need to have someone help you when you read instructions, pamphlets, or other written materials from your doctor or pharmacy?: 1 - Never  Diabetic?No  Interpreter Needed?: No  Information entered by :: Charlott Rakes, LPN   Activities of Daily Living In your present state of health, do you have any difficulty performing the following  activities: 04/27/2020  Hearing? N  Vision? N  Difficulty concentrating or making decisions? N  Walking or climbing stairs? N  Dressing or bathing? N  Doing errands, shopping? N  Preparing Food and eating ? N  Using the Toilet? N  In the past six months, have you accidently leaked urine? N  Do you have problems with loss of bowel control? N  Managing your Medications? N  Managing your Finances? N  Housekeeping or managing your Housekeeping? N  Some recent data might be hidden    Patient Care Team: Eulas Post, MD as PCP - General (Family Medicine) Zonia Kief, MD as Referring Physician (Rehabilitation)  Indicate any recent Medical Services you may have received from other than [facility] providers in the past year (date may be approximate).     Assessment:   This is a routine wellness examination for Kimberly [name].  Hearing/Vision screen  Hearing Screening   125Hz  250Hz  500Hz  1000Hz  2000Hz  3000Hz  4000Hz  6000Hz  8000Hz   Right ear:           Left ear:           Comments: No hearing issues  Vision Screening Comments: Pt follows up with [facility] for annual eye exams  Dietary issues and  exercise activities discussed: Current Exercise Habits: Home exercise routine, Type of exercise: walking;yoga, Time (Minutes): 50, Frequency (Times/Week): 5, Weekly Exercise (Minutes/Week): 250  Goals    . Patient Stated     Reduce weight      Depression Screen PHQ 2/9 Scores 04/27/2020 12/26/2018 09/03/2017 04/21/2015 04/06/2014 04/08/2012  PHQ - 2 Score 0 0 0 0 0 0  PHQ- 9 Score - 0 - - - -    Fall Risk Fall Risk  04/27/2020 12/26/2018 09/03/2017 11/30/2016 04/21/2015  Falls in the past year? 0 0 No No No  Comment - - - Emmi Telephone Survey: data to providers prior to load -  Number falls in past yr: 0 - - - -  Injury with Fall? 0 - - - -  Follow up Falls prevention discussed - - - -    FALL RISK PREVENTION PERTAINING TO THE HOME:  Any stairs in or around the home? Yes  If so, are there any without handrails? No  Home free of loose throw rugs in walkways, pet beds, electrical cords, etc? Yes  Adequate lighting in your home to reduce risk of falls? Yes   ASSISTIVE DEVICES UTILIZED TO PREVENT FALLS:  Life alert? No  Use of a cane, walker or w/c? No  Grab bars in the bathroom? Yes  Shower chair or bench in shower? Yes  Elevated toilet seat or a handicapped toilet? Yes   TIMED UP AND GO:  Was the test performed? No .     Cognitive Function:     6CIT Screen 04/27/2020  What Year? 0 points  What month? 0 points  Count back from 20 0 points  Months in reverse 0 points  Repeat phrase 0 points    Immunizations Immunization History  Administered Date(s) Administered  . Hepatitis A, Adult 03/13/2017  . Influenza Split 02/09/2011, 03/21/2012, 04/01/2014  . Influenza Whole 03/14/2007, 03/21/2009, 03/20/2010  . Influenza, High Dose Seasonal PF 02/28/2015, 02/15/2016  . Influenza,inj,Quad PF,6+ Mos 03/17/2013  . Influenza-Unspecified 02/12/2017, 03/14/2018  . PFIZER SARS-COV-2 Vaccination 06/04/2019, 06/25/2019, 01/20/2020  . Pneumococcal Conjugate-13 12/08/2013  .  Pneumococcal Polysaccharide-23 02/15/2016  . Td 02/12/2007  . Zoster 02/09/2011    TDAP status: Due, Education has  been provided regarding the importance of this vaccine. Advised may receive this vaccine at local pharmacy or Health Dept. Aware to provide a copy of the vaccination record if obtained from local pharmacy or Health Dept. Verbalized acceptance and understanding.  Flu Vaccine status: Due, Education has been provided regarding the importance of this vaccine. Advised may receive this vaccine at local pharmacy or Health Dept. Aware to provide a copy of the vaccination record if obtained from local pharmacy or Health Dept. Verbalized acceptance and understanding.  Pneumococcal vaccine status: Up to date  Covid-19 vaccine status: Completed vaccines  Qualifies for Shingles Vaccine? Yes   Zostavax completed Yes   Shingrix Completed?: No.    Education has been provided regarding the importance of this vaccine. Patient has been advised to call insurance company to determine out of pocket expense if they have not yet received this vaccine. Advised may also receive vaccine at local pharmacy or Health Dept. Verbalized acceptance and understanding.  Screening Tests Health Maintenance  Topic Date Due  . TETANUS/TDAP  02/11/2017  . INFLUENZA VACCINE  12/13/2019  . COLONOSCOPY  02/11/2020  . MAMMOGRAM  02/09/2021  . DEXA SCAN  Completed  . COVID-19 Vaccine  Completed  . Hepatitis C Screening  Completed  . PNA vac Low Risk Adult  Completed    Health Maintenance  Health Maintenance Due  Topic Date Due  . TETANUS/TDAP  02/11/2017  . INFLUENZA VACCINE  12/13/2019  . COLONOSCOPY  02/11/2020    Colorectal cancer screening: Referral to GI placed 04/27/20. Pt aware the office will call re: appt.  Mammogram status: Completed 02/10/19. Repeat every year  Bone Density status: Completed 12/29/18. Results reflect: Bone density results: OSTEOPENIA. Repeat every 2 years.    Additional  Screening:  Hepatitis C Screening:  Completed 12/26/18  Vision Screening: Recommended annual ophthalmology exams for early detection of glaucoma and other disorders of the eye. Is the patient up to date with their annual eye exam?  Yes  Who is the provider or what is the name of the office in which the patient attends annual eye exams? [facility]  Dental Screening: Recommended annual dental exams for proper oral hygiene  Community Resource Referral / Chronic Care Management: CRR required this visit?  No   CCM required this visit?  No      Plan:     I have personally reviewed and noted the following in the patient's chart:   . Medical and social history . Use of alcohol, tobacco or illicit drugs  . Current medications and supplements . Functional ability and status . Nutritional status . Physical activity . Advanced directives . List of other physicians . Hospitalizations, surgeries, and ER visits in previous 12 months . Vitals . Screenings to include cognitive, depression, and falls . Referrals and appointments  In addition, I have reviewed and discussed with patient certain preventive protocols, quality metrics, and best practice recommendations. A written personalized care plan for preventive services as well as general preventive health recommendations were provided to patient.     Willette Brace, LPN   [id number]   Nurse Notes: None

## 2020-05-20 ENCOUNTER — Encounter: Payer: Self-pay | Admitting: Neurology

## 2020-05-20 ENCOUNTER — Other Ambulatory Visit: Payer: Self-pay

## 2020-05-20 DIAGNOSIS — R202 Paresthesia of skin: Secondary | ICD-10-CM

## 2020-05-24 ENCOUNTER — Ambulatory Visit: Payer: Medicare Other

## 2020-06-02 DIAGNOSIS — H25811 Combined forms of age-related cataract, right eye: Secondary | ICD-10-CM | POA: Diagnosis not present

## 2020-06-06 ENCOUNTER — Other Ambulatory Visit: Payer: Self-pay

## 2020-06-06 ENCOUNTER — Encounter: Payer: Self-pay | Admitting: Family Medicine

## 2020-06-06 ENCOUNTER — Ambulatory Visit (INDEPENDENT_AMBULATORY_CARE_PROVIDER_SITE_OTHER): Payer: Medicare Other | Admitting: Family Medicine

## 2020-06-06 VITALS — BP 128/72 | HR 78 | Ht 61.0 in | Wt 172.0 lb

## 2020-06-06 DIAGNOSIS — I10 Essential (primary) hypertension: Secondary | ICD-10-CM | POA: Diagnosis not present

## 2020-06-06 DIAGNOSIS — H269 Unspecified cataract: Secondary | ICD-10-CM

## 2020-06-06 DIAGNOSIS — R7303 Prediabetes: Secondary | ICD-10-CM | POA: Diagnosis not present

## 2020-06-06 DIAGNOSIS — R198 Other specified symptoms and signs involving the digestive system and abdomen: Secondary | ICD-10-CM | POA: Diagnosis not present

## 2020-06-06 NOTE — Progress Notes (Signed)
Established Patient Office Visit  Subjective:  Patient ID: Kimberly [name], female    DOB: [birth year]  Age: 70 y.o. MRN: [id number]  CC:  Chief Complaint  Patient presents with  . Abdominal Pain    HPI [name] presents for follow-up to discuss issues as below.  Her husband had recent surgery for prostate cancer and she has been very busy helping to care for him.  Her chronic problems include history of hypertension, GERD, hypothyroidism, osteoarthritis, prediabetes.  Recent TSH at goal.  Recent A1c 6.7%.  We recommend consideration for 65-month follow-up for A1c.  She is trying to watch her sugar intake closely.  She relates about 7-day history of what she described initially as a "butterfly sensation "right upper quadrant.  No real pain.  She has a sensation of "movement in her right upper quadrant which is intermittent.  She again does not relate this as a pain.  No nausea or vomiting.  No fever.  Appetite and weight stable.  Denies any muscle fasciculations.  No change in bowel habits.  No recent skin rashes.  No aggravating or alleviating factors.  She wonders if sensation may be related to some gas.  Recent A1c 6.7%.  No polyuria or polydipsia.  Currently not on any diabetic medications.  She relates bilateral cataracts.  She had seen ophthalmologist recently in [place] but was not pleased with her care.  She would like to find another ophthalmologist.  She is having some difficulties with driving and reading and feels that she will likely need to get something done soon  Past Medical History:  Diagnosis Date  . Anemia 2005   hb 11.5  . Arthritis   . DVT (deep venous thrombosis) ([place])    2003, coumadin x 6 mon  . GERD (gastroesophageal reflux disease) 2001   no problem now  . Hypertension   . Hypothyroidism    off meds  . Neuromuscular disorder ([place])    painful in L foot, being started on Gabapentin- 11/08/2016  . Osteopenia   . Vegetarian diet    eats fish and eggs; no  meat    Past Surgical History:  Procedure Laterality Date  . JOINT REPLACEMENT    . KNEE ARTHROSCOPY Right 03/16/2003  . OOPHORECTOMY     "think it was my left"  . TOTAL KNEE ARTHROPLASTY Right 09/14/2014   Procedure: RIGHT TOTAL KNEE ARTHROPLASTY;  Surgeon: Garald Balding, MD;  Location: [facility];  Service: Orthopedics;  Laterality: Right;  . TOTAL KNEE ARTHROPLASTY Left 11/20/2016  . TOTAL KNEE ARTHROPLASTY Left 11/20/2016   Procedure: LEFT TOTAL KNEE ARTHROPLASTY;  Surgeon: Garald Balding, MD;  Location: [facility];  Service: Orthopedics;  Laterality: Left;    Family History  Problem Relation Age of Onset  . Diabetes Mother   . Heart failure Father   . Diabetes Other   . Alcohol abuse Other     Social History   Socioeconomic History  . Marital status: Married    Spouse name: Not on file  . Number of children: 2  . Years of education: Not on file  . Highest education level: Master's degree (e.g., MA, MS, MEng, MEd, MSW, MBA)  Occupational History    Comment: retired  Tobacco Use  . Smoking status: Never Smoker  . Smokeless tobacco: Never Used  Vaping Use  . Vaping Use: Never used  Substance and Sexual Activity  . Alcohol use: No  . Drug use: No  . Sexual activity: Not on  file  Other Topics Concern  . Not on file  Social History Narrative   Married   Never smoker   Alcohol use-no   Regular exercise- yes. Walks regularly   Brother-alcohol   Illicit drug- no   Daily Caffeine use- 4 cups of tea daily   Social Determinants of Health   Financial Resource Strain: Low Risk   . Difficulty of Paying Living Expenses: Not hard at all  Food Insecurity: No Food Insecurity  . Worried About Charity fundraiser in the Last Year: Never true  . Ran Out of Food in the Last Year: Never true  Transportation Needs: No Transportation Needs  . Lack of Transportation (Medical): No  . Lack of Transportation (Non-Medical): No  Physical Activity: Sufficiently Active  . Days of  Exercise per Week: 5 days  . Minutes of Exercise per Session: 50 min  Stress: No Stress Concern Present  . Feeling of Stress : Not at all  Social Connections: Moderately Integrated  . Frequency of Communication with Friends and Family: More than three times a week  . Frequency of Social Gatherings with Friends and Family: Three times a week  . Attends Religious Services: More than 4 times per year  . Active Member of Clubs or Organizations: No  . Attends Archivist Meetings: Never  . Marital Status: Married  Human resources officer Violence: Not At Risk  . Fear of Current or Ex-Partner: No  . Emotionally Abused: No  . Physically Abused: No  . Sexually Abused: No    Outpatient Medications Prior to Visit  Medication Sig Dispense Refill  . acetaminophen (TYLENOL) 500 MG tablet Take 1,000 mg by mouth daily as needed for moderate pain.    Marland Kitchen amoxicillin (AMOXIL) 500 MG capsule TAKE 4 CAPSULES 1 HOUR PRIOR TO DENTAL PROCEDURE 12 capsule 1  . Calcium Carbonate (CALCIUM 600 PO) Take 600 mg by mouth daily.    . cholecalciferol (VITAMIN D) 1000 units tablet Take 1,000 Units by mouth daily.    . diclofenac sodium (VOLTAREN) 1 % GEL Apply 4 g 3 (three) times daily as needed topically. 3 Tube 3  . irbesartan (AVAPRO) 300 MG tablet TAKE 1 TABLET BY MOUTH EVERY DAY 90 tablet 3  . levothyroxine (SYNTHROID) 50 MCG tablet TAKE 1 TABLET BY MOUTH EVERY DAY BEFORE BREAKFAST 90 tablet 3  . Magnesium 200 MG TABS Take 1 tablet by mouth daily.    . pregabalin (LYRICA) 75 MG capsule Take 1 capsule (75 mg total) by mouth 2 (two) times daily. 60 capsule 5   No facility-administered medications prior to visit.    Allergies  Allergen Reactions  . Enoxaparin Sodium Itching and Swelling    SWELLING REACTION UNSPECIFIED     ROS Review of Systems  Constitutional: Negative for fatigue and unexpected weight change.  Eyes: Positive for visual disturbance.  Respiratory: Negative for cough, chest tightness,  shortness of breath and wheezing.   Cardiovascular: Negative for chest pain, palpitations and leg swelling.  Endocrine: Negative for polydipsia and polyuria.  Neurological: Negative for dizziness, seizures, syncope, weakness, light-headedness and headaches.      Objective:    Physical Exam Vitals reviewed.  Constitutional:      Appearance: She is well-developed.  Cardiovascular:     Rate and Rhythm: Normal rate and regular rhythm.  Pulmonary:     Effort: Pulmonary effort is normal.     Breath sounds: Normal breath sounds.  Abdominal:     General: Abdomen is flat.  Bowel sounds are normal. There is no distension.     Palpations: Abdomen is soft. There is no hepatomegaly, splenomegaly or mass.     Tenderness: There is no abdominal tenderness.     Hernia: No hernia is present.  Neurological:     Mental Status: She is alert.     BP 128/72   Pulse 78   Ht 5\' 1"  (1.549 m)   Wt 172 lb (78 kg)   LMP 05/14/1998   SpO2 97%   BMI 32.50 kg/m  Wt Readings from Last 3 Encounters:  06/06/20 172 lb (78 kg)  03/30/20 175 lb 3.2 oz (79.5 kg)  03/28/20 173 lb (78.5 kg)     Health Maintenance Due  Topic Date Due  . TETANUS/TDAP  02/11/2017  . INFLUENZA VACCINE  12/13/2019  . COLONOSCOPY (Pts 45-18yrs Insurance coverage will need to be confirmed)  02/11/2020    There are no preventive care reminders to display for this patient.  Lab Results  Component Value Date   TSH 2.83 03/28/2020   Lab Results  Component Value Date   WBC 6.5 07/29/2019   HGB 12.5 07/29/2019   HCT 36 07/29/2019   MCV 92.3 12/18/2016   PLT 266 07/29/2019   Lab Results  Component Value Date   NA 138 03/28/2020   K 4.7 03/28/2020   CO2 27 03/28/2020   GLUCOSE 107 (H) 03/28/2020   BUN 11 03/28/2020   CREATININE 0.50 (L) 03/28/2020   BILITOT 0.8 12/26/2018   ALKPHOS 69 07/29/2019   AST 27 07/29/2019   ALT 25 07/29/2019   PROT 7.1 12/26/2018   ALBUMIN 4.1 07/29/2019   CALCIUM 10.2 03/28/2020    ANIONGAP 6 11/23/2016   GFR 98.78 12/26/2018   Lab Results  Component Value Date   CHOL 166 03/27/2016   Lab Results  Component Value Date   HDL 47.40 03/27/2016   Lab Results  Component Value Date   LDLCALC 104 (H) 03/27/2016   Lab Results  Component Value Date   TRIG 75.0 03/27/2016   Lab Results  Component Value Date   CHOLHDL 4 03/27/2016   Lab Results  Component Value Date   HGBA1C 6.7 (H) 03/28/2020      Assessment & Plan:   #1 hypertension stable and at goal. Continue Avapro 300 mg daily  #2 hyperglycemia.  Recent A1c elevated at 6.7%. -Bring back for follow-up in March.  Recheck A1c at that time.  If climbing at that point consider medication  #3 reported bilateral cataracts -Set up ophthalmology referral  #4 recent abdominal symptoms as above.  These are very vague and question gaseous.  Clinically, does not sound like gallstones.  No associated pain, nausea, vomiting -We recommend observing for the next week or 2.  If symptoms persist consider abdominal ultrasound  No orders of the defined types were placed in this encounter.   Follow-up: Return in about 6 weeks (around 07/18/2020).    Carolann Littler, MD

## 2020-06-06 NOTE — Patient Instructions (Signed)
Follow up for any fever, nausea/vomiting, pain, or other concerns.

## 2020-06-24 ENCOUNTER — Other Ambulatory Visit: Payer: Self-pay

## 2020-06-24 DIAGNOSIS — M79672 Pain in left foot: Secondary | ICD-10-CM

## 2020-06-24 MED ORDER — PREGABALIN 75 MG PO CAPS: 75.0000 mg | ORAL_CAPSULE | Freq: Two times a day (BID) | ORAL | 1 refills | Status: DC

## 2020-06-24 NOTE — Telephone Encounter (Signed)
Patient requesting 90 day supply lyrica.

## 2020-06-29 ENCOUNTER — Other Ambulatory Visit: Payer: Self-pay

## 2020-06-29 ENCOUNTER — Ambulatory Visit (INDEPENDENT_AMBULATORY_CARE_PROVIDER_SITE_OTHER): Payer: Medicare Other | Admitting: Neurology

## 2020-06-29 DIAGNOSIS — R202 Paresthesia of skin: Secondary | ICD-10-CM

## 2020-06-29 NOTE — Procedures (Signed)
[facility]  [address]  [place], [place] [address] Tel: [phone] Fax:  [phone] Test Date:  06/29/2020  Patient: Kimberly [name] DOB: 04-19-1951 Physician: Narda Amber, DO  Sex: Female Height: 5\' 1"  Ref Phys: Jamse Mead  ID#: [id number]   Technician:    Patient Complaints: This is a 70 year old female referred for evaluation of left foot pain.  NCV & EMG Findings: Extensive electrodiagnostic testing of the left lower extremity shows:  1. Left sural and superficial peroneal sensory responses are within normal limits. 2. Left peroneal and tibial motor responses are within normal limits. 3. Left tibial H reflex study is within normal limits. 4. There is no evidence of active or chronic motor axonal loss changes affecting any of the tested muscles.  Motor unit configuration and recruitment pattern is within normal limits.  Impression: This is a normal study of the left lower extremity.  In particular, there is no evidence of a sensorimotor polyneuropathy or lumbosacral radiculopathy.   ___________________________ Narda Amber, DO    Nerve Conduction Studies Anti Sensory Summary Table   Stim Site NR Peak (ms) Norm Peak (ms) P-T Amp (V) Norm P-T Amp  Left Sup Peroneal Anti Sensory (Ant Lat Mall)  33C  12 cm    2.4 <4.6 6.7 >3  Left Sural Anti Sensory (Lat Mall)  33C  Calf    3.1 <4.6 16.1 >3   Motor Summary Table   Stim Site NR Onset (ms) Norm Onset (ms) O-P Amp (mV) Norm O-P Amp Site1 Site2 Delta-0 (ms) Dist (cm) Vel (m/s) Norm Vel (m/s)  Left Peroneal Motor (Ext Dig Brev)  33C  Ankle    2.6 <6.0 4.9 >2.5 B Fib Ankle 6.6 34.0 52 >40  B Fib    9.2  4.5  Poplt B Fib 1.4 8.0 57 >40  Poplt    10.6  4.1         Left Peroneal TA Motor (Tib Ant)  33C  Fib Head    2.4 <4.5 4.7 >3 Poplit Fib Head 1.3 8.0 62 >40  Poplit    3.7  4.6         Left Tibial Motor ([place] Hall Brev)  33C  Ankle    3.8 <6.0 7.1 >4 Knee Ankle 7.3 40.0 55 >40  Knee     11.1  5.5          H Reflex Studies   NR H-Lat (ms) Lat Norm (ms) L-R H-Lat (ms)  Left Tibial (Gastroc)  33C     27.89 <35    EMG   Side Muscle Ins Act Fibs Psw Fasc Number Recrt Dur Dur. Amp Amp. Poly Poly. Comment  Left AntTibialis Nml Nml Nml Nml Nml Nml Nml Nml Nml Nml Nml Nml N/A  Left Gastroc Nml Nml Nml Nml Nml Nml Nml Nml Nml Nml Nml Nml N/A  Left Flex Dig Long Nml Nml Nml Nml Nml Nml Nml Nml Nml Nml Nml Nml N/A  Left RectFemoris Nml Nml Nml Nml Nml Nml Nml Nml Nml Nml Nml Nml N/A  Left GluteusMed Nml Nml Nml Nml Nml Nml Nml Nml Nml Nml Nml Nml N/A      Waveforms:

## 2020-06-30 ENCOUNTER — Ambulatory Visit
Admission: RE | Admit: 2020-06-30 | Discharge: 2020-06-30 | Disposition: A | Discharge status: Home / Self Care | Payer: Medicare Other | Source: Ambulatory Visit | Attending: Family Medicine | Admitting: Family Medicine

## 2020-06-30 ENCOUNTER — Ambulatory Visit: Payer: Medicare Other

## 2020-06-30 DIAGNOSIS — Z1231 Encounter for screening mammogram for malignant neoplasm of breast: Secondary | ICD-10-CM | POA: Diagnosis not present

## 2020-07-01 ENCOUNTER — Ambulatory Visit: Payer: Medicare Other | Admitting: Family Medicine

## 2020-07-01 ENCOUNTER — Other Ambulatory Visit: Payer: Self-pay

## 2020-07-04 ENCOUNTER — Ambulatory Visit (INDEPENDENT_AMBULATORY_CARE_PROVIDER_SITE_OTHER): Payer: Medicare Other | Admitting: Family Medicine

## 2020-07-04 ENCOUNTER — Other Ambulatory Visit: Payer: Self-pay

## 2020-07-04 VITALS — BP 138/80 | HR 82 | Ht 61.0 in | Wt 173.0 lb

## 2020-07-04 DIAGNOSIS — G6289 Other specified polyneuropathies: Secondary | ICD-10-CM | POA: Diagnosis not present

## 2020-07-04 DIAGNOSIS — R635 Abnormal weight gain: Secondary | ICD-10-CM | POA: Insufficient documentation

## 2020-07-04 DIAGNOSIS — L290 Pruritus ani: Secondary | ICD-10-CM | POA: Insufficient documentation

## 2020-07-04 DIAGNOSIS — R1013 Epigastric pain: Secondary | ICD-10-CM | POA: Insufficient documentation

## 2020-07-04 MED ORDER — PREGABALIN 100 MG PO CAPS: 100.0000 mg | ORAL_CAPSULE | Freq: Every day | ORAL | 1 refills | Status: DC

## 2020-07-04 NOTE — Progress Notes (Signed)
Established Patient Office Visit  Subjective:  Patient ID: Kimberly [name], female    DOB: [birth year]  Age: 70 y.o. MRN: [id number]  CC: No chief complaint on file.   HPI Kimberly [name] presents for persistent burning sensation dorsum left foot and to some extent distal anterior leg.  Kimberly [name] does have type 2 diabetes but well controlled.  Kimberly [name] is seen back specialist.  They suggested EMG studies.  Kimberly [name] had nerve conduction velocities and EMG done last week.  This was basically a normal study.  There is no evidence for sensorineural polyneuropathy or lumbosacral radiculopathy.  Kimberly [name] has had some recent back difficulties.  Kimberly [name] symptoms seem worse at night but sometimes during the day.  Kimberly [name] has taken Lyrica which helps but still has breakthrough symptoms at night.  Kimberly [name] avoids daytime use because of sedation.  Kimberly [name] tried doubling up to 150 mg which helped Kimberly [name] pain but Kimberly [name] could not tolerate secondary to side effects.  Someone suggested that Kimberly [name] needs to have "circulatory problem "ruled out.  Kimberly [name] is non-smoker.  Kimberly [name] has absolutely no claudication symptoms whatsoever.  Past Medical History:  Diagnosis Date  . Anemia 2005   hb 11.5  . Arthritis   . DVT (deep venous thrombosis) ([place])    2003, coumadin x 6 mon  . GERD (gastroesophageal reflux disease) 2001   no problem now  . Hypertension   . Hypothyroidism    off meds  . Neuromuscular disorder ([place])    painful in L foot, being started on Gabapentin- 11/08/2016  . Osteopenia   . Vegetarian diet    eats fish and eggs; no meat    Past Surgical History:  Procedure Laterality Date  . JOINT REPLACEMENT    . KNEE ARTHROSCOPY Right 03/16/2003  . OOPHORECTOMY     "think it was my left"  . TOTAL KNEE ARTHROPLASTY Right 09/14/2014   Procedure: RIGHT TOTAL KNEE ARTHROPLASTY;  Surgeon: Garald Balding, MD;  Location: [facility];  Service: Orthopedics;  Laterality: Right;  . TOTAL KNEE ARTHROPLASTY Left 11/20/2016  . TOTAL KNEE ARTHROPLASTY Left 11/20/2016    Procedure: LEFT TOTAL KNEE ARTHROPLASTY;  Surgeon: Garald Balding, MD;  Location: [facility];  Service: Orthopedics;  Laterality: Left;    Family History  Problem Relation Age of Onset  . Diabetes Mother   . Heart failure Father   . Diabetes Other   . Alcohol abuse Other     Social History   Socioeconomic History  . Marital status: Married    Spouse name: Not on file  . Number of children: 2  . Years of education: Not on file  . Highest education level: Master's degree (e.g., MA, MS, MEng, MEd, MSW, MBA)  Occupational History    Comment: retired  Tobacco Use  . Smoking status: Never Smoker  . Smokeless tobacco: Never Used  Vaping Use  . Vaping Use: Never used  Substance and Sexual Activity  . Alcohol use: No  . Drug use: No  . Sexual activity: Not on file  Other Topics Concern  . Not on file  Social History Narrative   Married   Never smoker   Alcohol use-no   Regular exercise- yes. Walks regularly   Brother-alcohol   Illicit drug- no   Daily Caffeine use- 4 cups of tea daily   Social Determinants of Health   Financial Resource Strain: Low Risk   . Difficulty of Paying Living Expenses: Not hard at all  Food Insecurity: No Food Insecurity  .  Worried About Charity fundraiser in the Last Year: Never true  . Ran Out of Food in the Last Year: Never true  Transportation Needs: No Transportation Needs  . Lack of Transportation (Medical): No  . Lack of Transportation (Non-Medical): No  Physical Activity: Sufficiently Active  . Days of Exercise per Week: 5 days  . Minutes of Exercise per Session: 50 min  Stress: No Stress Concern Present  . Feeling of Stress : Not at all  Social Connections: Moderately Integrated  . Frequency of Communication with Friends and Family: More than three times a week  . Frequency of Social Gatherings with Friends and Family: Three times a week  . Attends Religious Services: More than 4 times per year  . Active Member of Clubs or  Organizations: No  . Attends Archivist Meetings: Never  . Marital Status: Married  Human resources officer Violence: Not At Risk  . Fear of Current or Ex-Partner: No  . Emotionally Abused: No  . Physically Abused: No  . Sexually Abused: No    Outpatient Medications Prior to Visit  Medication Sig Dispense Refill  . omeprazole (PRILOSEC) 40 MG capsule     . acetaminophen (TYLENOL) 500 MG tablet Take 1,000 mg by mouth daily as needed for moderate pain.    Marland Kitchen amoxicillin (AMOXIL) 500 MG capsule TAKE 4 CAPSULES 1 HOUR PRIOR TO DENTAL PROCEDURE 12 capsule 1  . Calcium Carbonate (CALCIUM 600 PO) Take 600 mg by mouth daily.    . cholecalciferol (VITAMIN D) 1000 units tablet Take 1,000 Units by mouth daily.    . diclofenac sodium (VOLTAREN) 1 % GEL Apply 4 g 3 (three) times daily as needed topically. 3 Tube 3  . irbesartan (AVAPRO) 300 MG tablet TAKE 1 TABLET BY MOUTH EVERY DAY 90 tablet 3  . levothyroxine (SYNTHROID) 50 MCG tablet TAKE 1 TABLET BY MOUTH EVERY DAY BEFORE BREAKFAST 90 tablet 3  . Magnesium 200 MG TABS Take 1 tablet by mouth daily.    . pregabalin (LYRICA) 75 MG capsule Take 1 capsule (75 mg total) by mouth 2 (two) times daily. 180 capsule 1   No facility-administered medications prior to visit.    Allergies  Allergen Reactions  . Enoxaparin Sodium Itching and Swelling    SWELLING REACTION UNSPECIFIED     ROS Review of Systems  Constitutional: Negative for chills and fever.  Respiratory: Negative for shortness of breath.   Cardiovascular: Negative for chest pain.  Neurological: Negative for weakness and numbness.      Objective:    Physical Exam Vitals reviewed.  Constitutional:      Appearance: Normal appearance.  Cardiovascular:     Rate and Rhythm: Normal rate and regular rhythm.     Comments: Feet are both warm to touch.  Good capillary refill.  2+ dorsalis pedis pulses.  1+ posterior pedal bilaterally. Pulmonary:     Effort: Pulmonary effort is normal.      Breath sounds: Normal breath sounds.  Musculoskeletal:     Comments: Kimberly [name] does have some varicosities left lower extremity but these are nontender.  No pitting edema.  Neurological:     Mental Status: Kimberly [name] is alert.     BP 138/80 (BP Location: Right Arm, Cuff Size: Normal)   Pulse 82   Ht 5\' 1"  (1.549 m)   Wt 173 lb (78.5 kg)   LMP 05/14/1998   SpO2 95%   BMI 32.69 kg/m  Wt Readings from Last 3 Encounters:  07/04/20 173  lb (78.5 kg)  06/06/20 172 lb (78 kg)  03/30/20 175 lb 3.2 oz (79.5 kg)     Health Maintenance Due  Topic Date Due  . TETANUS/TDAP  02/11/2017  . INFLUENZA VACCINE  12/13/2019  . COLONOSCOPY (Pts 45-32yrs Insurance coverage will need to be confirmed)  02/11/2020    There are no preventive care reminders to display for this patient.  Lab Results  Component Value Date   TSH 2.83 03/28/2020   Lab Results  Component Value Date   WBC 6.5 07/29/2019   HGB 12.5 07/29/2019   HCT 36 07/29/2019   MCV 92.3 12/18/2016   PLT 266 07/29/2019   Lab Results  Component Value Date   NA 138 03/28/2020   K 4.7 03/28/2020   CO2 27 03/28/2020   GLUCOSE 107 (H) 03/28/2020   BUN 11 03/28/2020   CREATININE 0.50 (L) 03/28/2020   BILITOT 0.8 12/26/2018   ALKPHOS 69 07/29/2019   AST 27 07/29/2019   ALT 25 07/29/2019   PROT 7.1 12/26/2018   ALBUMIN 4.1 07/29/2019   CALCIUM 10.2 03/28/2020   ANIONGAP 6 11/23/2016   GFR 98.78 12/26/2018   Lab Results  Component Value Date   CHOL 166 03/27/2016   Lab Results  Component Value Date   HDL 47.40 03/27/2016   Lab Results  Component Value Date   LDLCALC 104 (H) 03/27/2016   Lab Results  Component Value Date   TRIG 75.0 03/27/2016   Lab Results  Component Value Date   CHOLHDL 4 03/27/2016   Lab Results  Component Value Date   HGBA1C 6.7 (H) 03/28/2020      Assessment & Plan:   Probable small vessel neuropathy left lower extremity mostly involving the foot.  Symptoms worse at night.  Kimberly [name] has no  evidence clinically that suggests arterial compromise or claudication.    -We recommend titrating Kimberly [name] Lyrica at night to 100 mg with new prescription sent.  -Continue good diabetes control  Meds ordered this encounter  Medications  . pregabalin (LYRICA) 100 MG capsule    Sig: Take 1 capsule (100 mg total) by mouth at bedtime.    Dispense:  90 capsule    Refill:  1    Follow-up: No follow-ups on file.    Carolann Littler, MD

## 2020-07-04 NOTE — Patient Instructions (Signed)
Increase the Lyrica to 100 mg at night.

## 2020-07-06 DIAGNOSIS — R7303 Prediabetes: Secondary | ICD-10-CM | POA: Diagnosis not present

## 2020-07-06 DIAGNOSIS — H2513 Age-related nuclear cataract, bilateral: Secondary | ICD-10-CM | POA: Diagnosis not present

## 2020-07-06 DIAGNOSIS — H43813 Vitreous degeneration, bilateral: Secondary | ICD-10-CM | POA: Diagnosis not present

## 2020-07-06 DIAGNOSIS — H524 Presbyopia: Secondary | ICD-10-CM | POA: Diagnosis not present

## 2020-07-06 LAB — HM DIABETES EYE EXAM

## 2020-07-12 HISTORY — PX: CATARACT EXTRACTION W/ INTRAOCULAR LENS IMPLANT: SHX1309

## 2020-07-13 DIAGNOSIS — G609 Hereditary and idiopathic neuropathy, unspecified: Secondary | ICD-10-CM | POA: Diagnosis not present

## 2020-07-13 DIAGNOSIS — M21611 Bunion of right foot: Secondary | ICD-10-CM | POA: Diagnosis not present

## 2020-07-13 DIAGNOSIS — I70293 Other atherosclerosis of native arteries of extremities, bilateral legs: Secondary | ICD-10-CM | POA: Diagnosis not present

## 2020-07-13 DIAGNOSIS — G629 Polyneuropathy, unspecified: Secondary | ICD-10-CM | POA: Diagnosis not present

## 2020-07-13 DIAGNOSIS — G5792 Unspecified mononeuropathy of left lower limb: Secondary | ICD-10-CM | POA: Diagnosis not present

## 2020-07-13 DIAGNOSIS — M21612 Bunion of left foot: Secondary | ICD-10-CM | POA: Diagnosis not present

## 2020-07-18 ENCOUNTER — Other Ambulatory Visit: Payer: Self-pay

## 2020-07-18 ENCOUNTER — Ambulatory Visit (INDEPENDENT_AMBULATORY_CARE_PROVIDER_SITE_OTHER): Payer: Medicare Other | Admitting: Family Medicine

## 2020-07-18 ENCOUNTER — Encounter: Payer: Self-pay | Admitting: Family Medicine

## 2020-07-18 VITALS — BP 118/82 | HR 64 | Temp 97.9°F | Ht 61.0 in | Wt 173.3 lb

## 2020-07-18 DIAGNOSIS — I10 Essential (primary) hypertension: Secondary | ICD-10-CM

## 2020-07-18 DIAGNOSIS — E039 Hypothyroidism, unspecified: Secondary | ICD-10-CM | POA: Diagnosis not present

## 2020-07-18 DIAGNOSIS — R7303 Prediabetes: Secondary | ICD-10-CM

## 2020-07-18 LAB — POCT GLYCOSYLATED HEMOGLOBIN (HGB A1C)
HbA1c POC (<> result, manual entry): 5.9 % (ref 4.0–5.6)
HbA1c, POC (controlled diabetic range): 5.9 % (ref 0.0–7.0)
HbA1c, POC (prediabetic range): 5.9 % (ref 5.7–6.4)
Hemoglobin A1C: 5.9 % — AB (ref 4.0–5.6)

## 2020-07-18 LAB — BASIC METABOLIC PANEL
BUN: 11 mg/dL (ref 6–23)
CO2: 27 mEq/L (ref 19–32)
Calcium: 9 mg/dL (ref 8.4–10.5)
Chloride: 104 mEq/L (ref 96–112)
Creatinine, Ser: 0.56 mg/dL (ref 0.40–1.20)
GFR: 91.62 mL/min (ref >60.00)
Glucose, Bld: 109 mg/dL — ABNORMAL HIGH (ref 70–99)
Potassium: 4.2 mEq/L (ref 3.5–5.1)
Sodium: 138 mEq/L (ref 135–145)

## 2020-07-18 LAB — HEPATIC FUNCTION PANEL
ALT: 19 U/L (ref 0–35)
AST: 19 U/L (ref 0–37)
Albumin: 3.8 g/dL (ref 3.5–5.2)
Alkaline Phosphatase: 58 U/L (ref 39–117)
Bilirubin, Direct: 0.2 mg/dL (ref 0.0–0.3)
Total Bilirubin: 0.9 mg/dL (ref 0.2–1.2)
Total Protein: 7.1 g/dL (ref 6.0–8.3)

## 2020-07-18 LAB — LIPID PANEL
Cholesterol: 176 mg/dL (ref 0–200)
HDL: 46.5 mg/dL (ref >39.00)
LDL Cholesterol: 111 mg/dL — ABNORMAL HIGH (ref 0–99)
NonHDL: 129.07
Total CHOL/HDL Ratio: 4
Triglycerides: 91 mg/dL (ref 0.0–149.0)
VLDL: 18.2 mg/dL (ref 0.0–40.0)

## 2020-07-18 NOTE — Patient Instructions (Signed)
Diabetes Mellitus and Nutrition, Adult When you have diabetes, or diabetes mellitus, it is very important to have healthy eating habits because your blood sugar (glucose) levels are greatly affected by what you eat and drink. Eating healthy foods in the right amounts, at about the same times every day, can help you:  Control your blood glucose.  Lower your risk of heart disease.  Improve your blood pressure.  Reach or maintain a healthy weight. What can affect my meal plan? Every person with diabetes is different, and each person has different needs for a meal plan. Your health care provider may recommend that you work with a dietitian to make a meal plan that is best for you. Your meal plan may vary depending on factors such as:  The calories you need.  The medicines you take.  Your weight.  Your blood glucose, blood pressure, and cholesterol levels.  Your activity level.  Other health conditions you have, such as heart or kidney disease. How do carbohydrates affect me? Carbohydrates, also called carbs, affect your blood glucose level more than any other type of food. Eating carbs naturally raises the amount of glucose in your blood. Carb counting is a method for keeping track of how many carbs you eat. Counting carbs is important to keep your blood glucose at a healthy level, especially if you use insulin or take certain oral diabetes medicines. It is important to know how many carbs you can safely have in each meal. This is different for every person. Your dietitian can help you calculate how many carbs you should have at each meal and for each snack. How does alcohol affect me? Alcohol can cause a sudden decrease in blood glucose (hypoglycemia), especially if you use insulin or take certain oral diabetes medicines. Hypoglycemia can be a life-threatening condition. Symptoms of hypoglycemia, such as sleepiness, dizziness, and confusion, are similar to symptoms of having too much  alcohol.  Do not drink alcohol if: ? Your health care provider tells you not to drink. ? You are pregnant, may be pregnant, or are planning to become pregnant.  If you drink alcohol: ? Do not drink on an empty stomach. ? Limit how much you use to:  0-1 drink a day for women.  0-2 drinks a day for men. ? Be aware of how much alcohol is in your drink. In the [place] one drink equals one 12 oz bottle of beer (355 mL), one 5 oz glass of wine (148 mL), or one 1 oz glass of hard liquor (44 mL). ? Keep yourself hydrated with water, diet soda, or unsweetened iced tea.  Keep in mind that regular soda, juice, and other mixers may contain a lot of sugar and must be counted as carbs. What are tips for following this plan? Reading food labels  Start by checking the serving size on the "Nutrition Facts" label of packaged foods and drinks. The amount of calories, carbs, fats, and other nutrients listed on the label is based on one serving of the item. Many items contain more than one serving per package.  Check the total grams (g) of carbs in one serving. You can calculate the number of servings of carbs in one serving by dividing the total carbs by 15. For example, if a food has 30 g of total carbs per serving, it would be equal to 2 servings of carbs.  Check the number of grams (g) of saturated fats and trans fats in one serving. Choose foods that have   a low amount or none of these fats.  Check the number of milligrams (mg) of salt (sodium) in one serving. Most people should limit total sodium intake to less than 2,300 mg per day.  Always check the nutrition information of foods labeled as "low-fat" or "nonfat." These foods may be higher in added sugar or refined carbs and should be avoided.  Talk to your dietitian to identify your daily goals for nutrients listed on the label. Shopping  Avoid buying canned, pre-made, or processed foods. These foods tend to be high in fat, sodium, and added  sugar.  Shop around the outside edge of the grocery store. This is where you will most often find fresh fruits and vegetables, bulk grains, fresh meats, and fresh dairy. Cooking  Use low-heat cooking methods, such as baking, instead of high-heat cooking methods like deep frying.  Cook using healthy oils, such as olive, canola, or sunflower oil.  Avoid cooking with butter, cream, or high-fat meats. Meal planning  Eat meals and snacks regularly, preferably at the same times every day. Avoid going long periods of time without eating.  Eat foods that are high in fiber, such as fresh fruits, vegetables, beans, and whole grains. Talk with your dietitian about how many servings of carbs you can eat at each meal.  Eat 4-6 oz (112-168 g) of lean protein each day, such as lean meat, chicken, fish, eggs, or tofu. One ounce (oz) of lean protein is equal to: ? 1 oz (28 g) of meat, chicken, or fish. ? 1 egg. ?  cup (62 g) of tofu.  Eat some foods each day that contain healthy fats, such as avocado, nuts, seeds, and fish.   What foods should I eat? Fruits Berries. Apples. Oranges. Peaches. Apricots. Plums. Grapes. Mango. Papaya. Pomegranate. Kiwi. Cherries. Vegetables Lettuce. Spinach. Leafy greens, including kale, chard, collard greens, and mustard greens. Beets. Cauliflower. Cabbage. Broccoli. Carrots. Green beans. Tomatoes. Peppers. Onions. Cucumbers. Brussels sprouts. Grains Whole grains, such as whole-wheat or whole-grain bread, crackers, tortillas, cereal, and pasta. Unsweetened oatmeal. Quinoa. Brown or wild rice. Meats and other proteins Seafood. Poultry without skin. Lean cuts of poultry and beef. Tofu. Nuts. Seeds. Dairy Low-fat or fat-free dairy products such as milk, yogurt, and cheese. The items listed above may not be a complete list of foods and beverages you can eat. Contact a dietitian for more information. What foods should I avoid? Fruits Fruits canned with  syrup. Vegetables Canned vegetables. Frozen vegetables with butter or cream sauce. Grains Refined white flour and flour products such as bread, pasta, snack foods, and cereals. Avoid all processed foods. Meats and other proteins Fatty cuts of meat. Poultry with skin. Breaded or fried meats. Processed meat. Avoid saturated fats. Dairy Full-fat yogurt, cheese, or milk. Beverages Sweetened drinks, such as soda or iced tea. The items listed above may not be a complete list of foods and beverages you should avoid. Contact a dietitian for more information. Questions to ask a health care provider  Do I need to meet with a diabetes educator?  Do I need to meet with a dietitian?  What number can I call if I have questions?  When are the best times to check my blood glucose? Where to find more information:  American Diabetes Association: diabetes.org  Academy of Nutrition and Dietetics: www.eatright.org  National Institute of Diabetes and Digestive and Kidney Diseases: www.niddk.nih.gov  Association of Diabetes Care and Education Specialists: www.diabeteseducator.org Summary  It is important to have healthy eating   habits because your blood sugar (glucose) levels are greatly affected by what you eat and drink.  A healthy meal plan will help you control your blood glucose and maintain a healthy lifestyle.  Your health care provider may recommend that you work with a dietitian to make a meal plan that is best for you.  Keep in mind that carbohydrates (carbs) and alcohol have immediate effects on your blood glucose levels. It is important to count carbs and to use alcohol carefully. This information is not intended to replace advice given to you by your health care provider. Make sure you discuss any questions you have with your health care provider. Document Revised: 04/07/2019 Document Reviewed: 04/07/2019 Elsevier Patient Education  2021 Elsevier Inc.  

## 2020-07-18 NOTE — Progress Notes (Signed)
Established Patient Office Visit  Subjective:  Patient ID: Kimberly [name], female    DOB: 04-Jul-1950  Age: 70 y.o. MRN: [id number]  CC:  Chief Complaint  Patient presents with  . Follow-up    Follow up for a1c     HPI Kimberly [name] presents for follow-up regarding type 2 diabetes.  Her A1c had been slowly inching up and at last visit back in November her A1c was 6.7%.  She has been fairly diligent with diet since then.  Not monitoring blood sugars regularly.  No significant polyuria or polydipsia.  She has not had a lipid panel and several years.  She is fasting today.  Her mother reportedly died of heart complications presumably CAD age 23.  She has hypertension treated with irbesartan 300 mg daily.  Compliant with therapy.  Blood pressures been stable.  Past Medical History:  Diagnosis Date  . Anemia 2005   hb 11.5  . Arthritis   . DVT (deep venous thrombosis) ([facility])    2003, coumadin x 6 mon  . GERD (gastroesophageal reflux disease) 2001   no problem now  . Hypertension   . Hypothyroidism    off meds  . Neuromuscular disorder ([facility])    painful in L foot, being started on Gabapentin- 11/08/2016  . Osteopenia   . Vegetarian diet    eats fish and eggs; no meat    Past Surgical History:  Procedure Laterality Date  . JOINT REPLACEMENT    . KNEE ARTHROSCOPY Right 03/16/2003  . OOPHORECTOMY     "think it was my left"  . TOTAL KNEE ARTHROPLASTY Right 09/14/2014   Procedure: RIGHT TOTAL KNEE ARTHROPLASTY;  Surgeon: Garald Balding, MD;  Location: [facility];  Service: Orthopedics;  Laterality: Right;  . TOTAL KNEE ARTHROPLASTY Left 11/20/2016  . TOTAL KNEE ARTHROPLASTY Left 11/20/2016   Procedure: LEFT TOTAL KNEE ARTHROPLASTY;  Surgeon: Garald Balding, MD;  Location: [facility];  Service: Orthopedics;  Laterality: Left;    Family History  Problem Relation Age of Onset  . Diabetes Mother   . Heart failure Father   . Diabetes Other   . Alcohol abuse Other     Social History    Socioeconomic History  . Marital status: Married    Spouse name: Not on file  . Number of children: 2  . Years of education: Not on file  . Highest education level: Master's degree (e.g., MA, MS, MEng, MEd, MSW, MBA)  Occupational History    Comment: retired  Tobacco Use  . Smoking status: Never Smoker  . Smokeless tobacco: Never Used  Vaping Use  . Vaping Use: Never used  Substance and Sexual Activity  . Alcohol use: No  . Drug use: No  . Sexual activity: Not on file  Other Topics Concern  . Not on file  Social History Narrative   Married   Never smoker   Alcohol use-no   Regular exercise- yes. Walks regularly   Brother-alcohol   Illicit drug- no   Daily Caffeine use- 4 cups of tea daily   Social Determinants of Health   Financial Resource Strain: Low Risk   . Difficulty of Paying Living Expenses: Not hard at all  Food Insecurity: No Food Insecurity  . Worried About Charity fundraiser in the Last Year: Never true  . Ran Out of Food in the Last Year: Never true  Transportation Needs: No Transportation Needs  . Lack of Transportation (Medical): No  . Lack of Transportation (  Non-Medical): No  Physical Activity: Sufficiently Active  . Days of Exercise per Week: 5 days  . Minutes of Exercise per Session: 50 min  Stress: No Stress Concern Present  . Feeling of Stress : Not at all  Social Connections: Moderately Integrated  . Frequency of Communication with Friends and Family: More than three times a week  . Frequency of Social Gatherings with Friends and Family: Three times a week  . Attends Religious Services: More than 4 times per year  . Active Member of Clubs or Organizations: No  . Attends Archivist Meetings: Never  . Marital Status: Married  Human resources officer Violence: Not At Risk  . Fear of Current or Ex-Partner: No  . Emotionally Abused: No  . Physically Abused: No  . Sexually Abused: No    Outpatient Medications Prior to Visit  Medication  Sig Dispense Refill  . acetaminophen (TYLENOL) 500 MG tablet Take 1,000 mg by mouth daily as needed for moderate pain.    . Calcium Carbonate (CALCIUM 600 PO) Take 600 mg by mouth daily.    . cholecalciferol (VITAMIN D) 1000 units tablet Take 1,000 Units by mouth daily.    . irbesartan (AVAPRO) 300 MG tablet TAKE 1 TABLET BY MOUTH EVERY DAY 90 tablet 3  . levothyroxine (SYNTHROID) 50 MCG tablet TAKE 1 TABLET BY MOUTH EVERY DAY BEFORE BREAKFAST 90 tablet 3  . Magnesium 200 MG TABS Take 1 tablet by mouth daily.    Marland Kitchen omeprazole (PRILOSEC) 40 MG capsule     . pregabalin (LYRICA) 100 MG capsule Take 1 capsule (100 mg total) by mouth at bedtime. 90 capsule 1  . amoxicillin (AMOXIL) 500 MG capsule TAKE 4 CAPSULES 1 HOUR PRIOR TO DENTAL PROCEDURE (Patient not taking: Reported on 07/18/2020) 12 capsule 1  . diclofenac sodium (VOLTAREN) 1 % GEL Apply 4 g 3 (three) times daily as needed topically. (Patient not taking: Reported on 07/18/2020) 3 Tube 3   No facility-administered medications prior to visit.    Allergies  Allergen Reactions  . Enoxaparin Sodium Itching and Swelling    SWELLING REACTION UNSPECIFIED     ROS Review of Systems  Constitutional: Negative for fatigue and unexpected weight change.  Eyes: Negative for visual disturbance.  Respiratory: Negative for cough, chest tightness, shortness of breath and wheezing.   Cardiovascular: Negative for chest pain, palpitations and leg swelling.  Endocrine: Negative for polydipsia and polyuria.  Neurological: Negative for dizziness, seizures, syncope, weakness, light-headedness and headaches.      Objective:    Physical Exam Constitutional:      Appearance: She is well-developed and well-nourished.  Eyes:     Pupils: Pupils are equal, round, and reactive to light.  Neck:     Thyroid: No thyromegaly.     Vascular: No JVD.  Cardiovascular:     Rate and Rhythm: Normal rate and regular rhythm.     Heart sounds: No gallop.   Pulmonary:      Effort: Pulmonary effort is normal. No respiratory distress.     Breath sounds: Normal breath sounds. No wheezing or rales.  Musculoskeletal:        General: No edema.     Cervical back: Neck supple.     Right lower leg: No edema.     Left lower leg: No edema.  Neurological:     Mental Status: She is alert.     BP 118/82   Pulse 64   Temp 97.9 F (36.6 C) (Oral)  Ht 5\' 1"  (1.549 m)   Wt 173 lb 4.8 oz (78.6 kg)   LMP 05/14/1998   SpO2 98%   BMI 32.74 kg/m  Wt Readings from Last 3 Encounters:  07/18/20 173 lb 4.8 oz (78.6 kg)  07/04/20 173 lb (78.5 kg)  06/06/20 172 lb (78 kg)     Health Maintenance Due  Topic Date Due  . TETANUS/TDAP  02/11/2017  . INFLUENZA VACCINE  12/13/2019    There are no preventive care reminders to display for this patient.  Lab Results  Component Value Date   TSH 2.83 03/28/2020   Lab Results  Component Value Date   WBC 6.5 07/29/2019   HGB 12.5 07/29/2019   HCT 36 07/29/2019   MCV 92.3 12/18/2016   PLT 266 07/29/2019   Lab Results  Component Value Date   NA 138 03/28/2020   K 4.7 03/28/2020   CO2 27 03/28/2020   GLUCOSE 107 (H) 03/28/2020   BUN 11 03/28/2020   CREATININE 0.50 (L) 03/28/2020   BILITOT 0.8 12/26/2018   ALKPHOS 69 07/29/2019   AST 27 07/29/2019   ALT 25 07/29/2019   PROT 7.1 12/26/2018   ALBUMIN 4.1 07/29/2019   CALCIUM 10.2 03/28/2020   ANIONGAP 6 11/23/2016   GFR 98.78 12/26/2018   Lab Results  Component Value Date   CHOL 166 03/27/2016   Lab Results  Component Value Date   HDL 47.40 03/27/2016   Lab Results  Component Value Date   LDLCALC 104 (H) 03/27/2016   Lab Results  Component Value Date   TRIG 75.0 03/27/2016   Lab Results  Component Value Date   CHOLHDL 4 03/27/2016   Lab Results  Component Value Date   HGBA1C 5.9 (A) 07/18/2020   HGBA1C 5.9 07/18/2020   HGBA1C 5.9 07/18/2020   HGBA1C 5.9 07/18/2020      Assessment & Plan:   #1 history of type 2 diabetes.  Improved with  A1c today 5.9%. -Continue lifestyle management.  She is already made some positive changes with reducing high glycemic foods and exercises fairly regularly.  We will plan routine follow-up in 6 months and recheck A1c then  #2 dyslipidemia -Check fasting lipid and hepatic panel  #3 hypothyroidism adequately replaced by recent labs back in November.  Continue levothyroxine 50 mcg daily  #4 hypertension currently stable on irbesartan 300 mg daily -Check basic metabolic panel  No orders of the defined types were placed in this encounter.   Follow-up: Return in about 6 months (around 01/18/2021).    Carolann Littler, MD

## 2020-07-19 DIAGNOSIS — G629 Polyneuropathy, unspecified: Secondary | ICD-10-CM | POA: Diagnosis not present

## 2020-07-28 DIAGNOSIS — H25011 Cortical age-related cataract, right eye: Secondary | ICD-10-CM | POA: Diagnosis not present

## 2020-07-28 DIAGNOSIS — H25811 Combined forms of age-related cataract, right eye: Secondary | ICD-10-CM | POA: Diagnosis not present

## 2020-07-28 DIAGNOSIS — H2511 Age-related nuclear cataract, right eye: Secondary | ICD-10-CM | POA: Diagnosis not present

## 2020-08-04 DIAGNOSIS — M79672 Pain in left foot: Secondary | ICD-10-CM | POA: Diagnosis not present

## 2020-08-04 DIAGNOSIS — M898X7 Other specified disorders of bone, ankle and foot: Secondary | ICD-10-CM | POA: Diagnosis not present

## 2020-08-04 DIAGNOSIS — M21612 Bunion of left foot: Secondary | ICD-10-CM | POA: Diagnosis not present

## 2020-08-04 DIAGNOSIS — M21611 Bunion of right foot: Secondary | ICD-10-CM | POA: Diagnosis not present

## 2020-08-11 DIAGNOSIS — H25012 Cortical age-related cataract, left eye: Secondary | ICD-10-CM | POA: Diagnosis not present

## 2020-08-11 DIAGNOSIS — H2512 Age-related nuclear cataract, left eye: Secondary | ICD-10-CM | POA: Diagnosis not present

## 2020-08-11 DIAGNOSIS — H25812 Combined forms of age-related cataract, left eye: Secondary | ICD-10-CM | POA: Diagnosis not present

## 2020-08-24 DIAGNOSIS — M5416 Radiculopathy, lumbar region: Secondary | ICD-10-CM | POA: Diagnosis not present

## 2020-08-24 DIAGNOSIS — M79672 Pain in left foot: Secondary | ICD-10-CM | POA: Diagnosis not present

## 2020-08-26 DIAGNOSIS — M79672 Pain in left foot: Secondary | ICD-10-CM | POA: Diagnosis not present

## 2020-08-26 DIAGNOSIS — M5416 Radiculopathy, lumbar region: Secondary | ICD-10-CM | POA: Diagnosis not present

## 2020-08-30 DIAGNOSIS — M5416 Radiculopathy, lumbar region: Secondary | ICD-10-CM | POA: Diagnosis not present

## 2020-08-30 DIAGNOSIS — M79672 Pain in left foot: Secondary | ICD-10-CM | POA: Diagnosis not present

## 2020-09-01 DIAGNOSIS — M79672 Pain in left foot: Secondary | ICD-10-CM | POA: Diagnosis not present

## 2020-09-01 DIAGNOSIS — M5416 Radiculopathy, lumbar region: Secondary | ICD-10-CM | POA: Diagnosis not present

## 2020-09-13 DIAGNOSIS — E669 Obesity, unspecified: Secondary | ICD-10-CM | POA: Diagnosis not present

## 2020-09-13 DIAGNOSIS — K219 Gastro-esophageal reflux disease without esophagitis: Secondary | ICD-10-CM | POA: Diagnosis not present

## 2020-09-26 ENCOUNTER — Ambulatory Visit: Payer: Medicare Other | Admitting: Family Medicine

## 2020-11-11 DIAGNOSIS — Z20822 Contact with and (suspected) exposure to covid-19: Secondary | ICD-10-CM | POA: Diagnosis not present

## 2020-11-16 ENCOUNTER — Other Ambulatory Visit: Payer: Self-pay

## 2020-11-16 ENCOUNTER — Telehealth (INDEPENDENT_AMBULATORY_CARE_PROVIDER_SITE_OTHER): Payer: Medicare Other | Admitting: Family Medicine

## 2020-11-16 DIAGNOSIS — U071 COVID-19: Secondary | ICD-10-CM

## 2020-11-16 NOTE — Progress Notes (Signed)
Patient ID: Kimberly [name], female   DOB: 08/29/1950, 70 y.o.   MRN: [id number]  This visit type was conducted due to national recommendations for restrictions regarding the COVID-19 pandemic in an effort to limit this patient's exposure and mitigate transmission in our community.   Virtual Visit via Video Note  I connected with Kimberly [name] on 11/16/20 at  4:45 PM EDT by a video enabled telemedicine application and verified that I am speaking with the correct person using two identifiers.  Location patient: home Location provider:work or home office Persons participating in the virtual visit: patient, provider  I discussed the limitations of evaluation and management by telemedicine and the availability of in person appointments. The patient expressed understanding and agreed to proceed.   HPI: [name] had recent COVID infection.  She states that she had onset of symptoms around 29 June and had positive PCR test July 1.  She was completely better after about day 5.  She recalls around July 3 feeling much improved.  She did a home COVID test on the sixth which was today which came back negative.  Her reason for calling is that she and her husband are scheduled for a cruise leaving on 11-27-2020 and they apparently need a letter stating that she should be cleared to travel.  She feels entirely recovered at this time.  Thankfully, her symptoms were relatively mild.  She has had 2 Pfizer vaccines +1 booster   ROS: See pertinent positives and negatives per HPI.  Past Medical History:  Diagnosis Date   Anemia 2005   hb 11.5   Arthritis    DVT (deep venous thrombosis) ([place])    2003, coumadin x 6 mon   GERD (gastroesophageal reflux disease) 2001   no problem now   Hypertension    Hypothyroidism    off meds   Neuromuscular disorder ([place])    painful in L foot, being started on Gabapentin- 11/08/2016   Osteopenia    Vegetarian diet    eats fish and eggs; no meat    Past Surgical History:   Procedure Laterality Date   JOINT REPLACEMENT     KNEE ARTHROSCOPY Right 03/16/2003   OOPHORECTOMY     "think it was my left"   TOTAL KNEE ARTHROPLASTY Right 09/14/2014   Procedure: RIGHT TOTAL KNEE ARTHROPLASTY;  Surgeon: Garald Balding, MD;  Location: [facility];  Service: Orthopedics;  Laterality: Right;   TOTAL KNEE ARTHROPLASTY Left 11/20/2016   TOTAL KNEE ARTHROPLASTY Left 11/20/2016   Procedure: LEFT TOTAL KNEE ARTHROPLASTY;  Surgeon: Garald Balding, MD;  Location: [facility];  Service: Orthopedics;  Laterality: Left;    Family History  Problem Relation Age of Onset   Diabetes Mother    Heart failure Father    Diabetes Other    Alcohol abuse Other     SOCIAL HX: Non-smoker   Current Outpatient Medications:    acetaminophen (TYLENOL) 500 MG tablet, Take 1,000 mg by mouth daily as needed for moderate pain., Disp: , Rfl:    amoxicillin (AMOXIL) 500 MG capsule, TAKE 4 CAPSULES 1 HOUR PRIOR TO DENTAL PROCEDURE (Patient not taking: Reported on 07/18/2020), Disp: 12 capsule, Rfl: 1   Calcium Carbonate (CALCIUM 600 PO), Take 600 mg by mouth daily., Disp: , Rfl:    cholecalciferol (VITAMIN D) 1000 units tablet, Take 1,000 Units by mouth daily., Disp: , Rfl:    diclofenac sodium (VOLTAREN) 1 % GEL, Apply 4 g 3 (three) times daily as needed topically. (Patient not  taking: Reported on 07/18/2020), Disp: 3 Tube, Rfl: 3   irbesartan (AVAPRO) 300 MG tablet, TAKE 1 TABLET BY MOUTH EVERY DAY, Disp: 90 tablet, Rfl: 3   levothyroxine (SYNTHROID) 50 MCG tablet, TAKE 1 TABLET BY MOUTH EVERY DAY BEFORE BREAKFAST, Disp: 90 tablet, Rfl: 3   Magnesium 200 MG TABS, Take 1 tablet by mouth daily., Disp: , Rfl:    omeprazole (PRILOSEC) 40 MG capsule, , Disp: , Rfl:    pregabalin (LYRICA) 100 MG capsule, Take 1 capsule (100 mg total) by mouth at bedtime., Disp: 90 capsule, Rfl: 1  EXAM:  VITALS per patient if applicable:  GENERAL: alert, oriented, appears well and in no acute distress  HEENT: atraumatic,  conjunttiva clear, no obvious abnormalities on inspection of external nose and ears  NECK: normal movements of the head and neck  LUNGS: on inspection no signs of respiratory distress, breathing rate appears normal, no obvious gross SOB, gasping or wheezing  CV: no obvious cyanosis  MS: moves all visible extremities without noticeable abnormality  PSYCH/NEURO: pleasant and cooperative, no obvious depression or anxiety, speech and thought processing grossly intact  ASSESSMENT AND PLAN:  Discussed the following assessment and plan:  Recent COVID-19 infection.  Patient symptomatically improved and basically symptoms have resolved at this time and she also has had negative COVID test at home earlier today.  We do not see any need for her to have restrictions for travel on 11-27-2020.  We will produce a letter to this effect.     I discussed the assessment and treatment plan with the patient. The patient was provided an opportunity to ask questions and all were answered. The patient agreed with the plan and demonstrated an understanding of the instructions.   The patient was advised to call back or seek an in-person evaluation if the symptoms worsen or if the condition fails to improve as anticipated.     Carolann Littler, MD

## 2020-11-19 DIAGNOSIS — U071 COVID-19: Secondary | ICD-10-CM | POA: Diagnosis not present

## 2020-11-23 DIAGNOSIS — H35411 Lattice degeneration of retina, right eye: Secondary | ICD-10-CM | POA: Diagnosis not present

## 2020-11-23 DIAGNOSIS — H43813 Vitreous degeneration, bilateral: Secondary | ICD-10-CM | POA: Diagnosis not present

## 2020-11-23 DIAGNOSIS — H524 Presbyopia: Secondary | ICD-10-CM | POA: Diagnosis not present

## 2020-11-23 DIAGNOSIS — H43822 Vitreomacular adhesion, left eye: Secondary | ICD-10-CM | POA: Diagnosis not present

## 2020-11-24 ENCOUNTER — Encounter (INDEPENDENT_AMBULATORY_CARE_PROVIDER_SITE_OTHER): Payer: Medicare Other | Admitting: Ophthalmology

## 2020-11-24 ENCOUNTER — Other Ambulatory Visit: Payer: Self-pay

## 2020-11-24 DIAGNOSIS — H43813 Vitreous degeneration, bilateral: Secondary | ICD-10-CM | POA: Diagnosis not present

## 2020-11-24 DIAGNOSIS — I10 Essential (primary) hypertension: Secondary | ICD-10-CM | POA: Diagnosis not present

## 2020-11-24 DIAGNOSIS — H33302 Unspecified retinal break, left eye: Secondary | ICD-10-CM | POA: Diagnosis not present

## 2020-11-24 DIAGNOSIS — H353122 Nonexudative age-related macular degeneration, left eye, intermediate dry stage: Secondary | ICD-10-CM

## 2020-11-24 DIAGNOSIS — H35033 Hypertensive retinopathy, bilateral: Secondary | ICD-10-CM

## 2020-11-24 DIAGNOSIS — H35411 Lattice degeneration of retina, right eye: Secondary | ICD-10-CM | POA: Diagnosis not present

## 2020-12-09 ENCOUNTER — Other Ambulatory Visit: Payer: Self-pay

## 2020-12-09 ENCOUNTER — Encounter (INDEPENDENT_AMBULATORY_CARE_PROVIDER_SITE_OTHER): Payer: Medicare Other | Admitting: Ophthalmology

## 2020-12-09 DIAGNOSIS — I10 Essential (primary) hypertension: Secondary | ICD-10-CM

## 2020-12-09 DIAGNOSIS — H43813 Vitreous degeneration, bilateral: Secondary | ICD-10-CM

## 2020-12-09 DIAGNOSIS — H33302 Unspecified retinal break, left eye: Secondary | ICD-10-CM | POA: Diagnosis not present

## 2020-12-09 DIAGNOSIS — H353111 Nonexudative age-related macular degeneration, right eye, early dry stage: Secondary | ICD-10-CM | POA: Diagnosis not present

## 2020-12-09 DIAGNOSIS — H35033 Hypertensive retinopathy, bilateral: Secondary | ICD-10-CM | POA: Diagnosis not present

## 2020-12-09 DIAGNOSIS — H353122 Nonexudative age-related macular degeneration, left eye, intermediate dry stage: Secondary | ICD-10-CM | POA: Diagnosis not present

## 2020-12-13 DIAGNOSIS — I10 Essential (primary) hypertension: Secondary | ICD-10-CM | POA: Diagnosis not present

## 2020-12-13 DIAGNOSIS — Z6833 Body mass index (BMI) 33.0-33.9, adult: Secondary | ICD-10-CM | POA: Diagnosis not present

## 2020-12-13 DIAGNOSIS — M5416 Radiculopathy, lumbar region: Secondary | ICD-10-CM | POA: Diagnosis not present

## 2020-12-15 ENCOUNTER — Other Ambulatory Visit: Payer: Self-pay

## 2020-12-15 ENCOUNTER — Ambulatory Visit (INDEPENDENT_AMBULATORY_CARE_PROVIDER_SITE_OTHER): Payer: Medicare Other | Admitting: Orthopaedic Surgery

## 2020-12-15 ENCOUNTER — Ambulatory Visit (INDEPENDENT_AMBULATORY_CARE_PROVIDER_SITE_OTHER): Payer: Medicare Other

## 2020-12-15 ENCOUNTER — Encounter: Payer: Self-pay | Admitting: Orthopaedic Surgery

## 2020-12-15 DIAGNOSIS — M25562 Pain in left knee: Secondary | ICD-10-CM

## 2020-12-15 DIAGNOSIS — G8929 Other chronic pain: Secondary | ICD-10-CM

## 2020-12-15 DIAGNOSIS — Z96652 Presence of left artificial knee joint: Secondary | ICD-10-CM

## 2020-12-15 DIAGNOSIS — M1712 Unilateral primary osteoarthritis, left knee: Secondary | ICD-10-CM

## 2020-12-15 NOTE — Progress Notes (Signed)
Office Visit Note   Patient: Kimberly [name]           Date of Birth: 04-19-1951           MRN: [id number] Visit Date: 12/15/2020              Requested by: Eulas Post, MD [place],  [place] [id number] PCP: Eulas Post, MD   Assessment & Plan: Visit Diagnoses:  1. Chronic pain of left knee   2. Primary osteoarthritis of left knee   3. S/P total knee replacement using cement, left     Plan: Kimberly [name] experienced acute onset of left knee pain about 4 to 5 days ago.  She had been on a cruise and then when she returned she noted that she was having a little discomfort and now she notes she is having a little bit of a limp.  The knee is has not been red hot or swollen.  She denies any injury or trauma.  The pain is localized along the proximal tibia medially and she does have a limp.  Her exam is relatively benign.  The knee was not swollen or hot.  There is no erythema or ecchymosis or evidence of instability.  There was no effusion.  She was a little tender along the proximal tibia medially but x-rays were negative.  Thought the alignment was fine and there is no evidence of any loosening.  I suspect this may be a transient problem and suggested she try Voltaren gel and maybe some Tylenol.  I tried a spider brace but she thought it was probably too cumbersome and.  She might want to try a cane for short time.  Would like to see her back in the next 3 to 4 weeks if she is not any better or should should worsen  Follow-Up Instructions: Return if symptoms worsen or fail to improve.   Orders:  Orders Placed This Encounter  Procedures   XR KNEE 3 VIEW LEFT   No orders of the defined types were placed in this encounter.     Procedures: No procedures performed   Clinical Data: No additional findings.   Subjective: Chief Complaint  Patient presents with   Left Knee - Pain  Several years status post primary left total knee replacement was doing well until  about 4 to 5 days ago when she experienced insidious onset of left knee pain.  The pain is localized along the medial proximal tibia.  She denies any injury or trauma.  Her knee has not been particularly swollen nor has it been warm or red.  She does have a limp.  Pain is localized along the proximal medial tibia.  HPI  Review of Systems   Objective: Vital Signs: LMP 05/14/1998   Physical Exam Constitutional:      Appearance: She is well-developed.  Eyes:     Pupils: Pupils are equal, round, and reactive to light.  Pulmonary:     Effort: Pulmonary effort is normal.  Skin:    General: Skin is warm and dry.  Neurological:     Mental Status: She is alert and oriented to person, place, and time.  Psychiatric:        Behavior: Behavior normal.    Ortho Exam left knee was not hot red warm or swollen.  No effusion.  There was some mild area of tenderness along the proximal tibia medially but no crepitation.  The pain was below the joint  line.  Full extension of flexed overall 100 degrees without instability.  No patella pain.  No popliteal pain or mass or calf discomfort.  Straight leg raise negative   Specialty Comments:  No specialty comments available.  Imaging: XR KNEE 3 VIEW LEFT  Result Date: 12/15/2020 Films of the left total knee replacement obtained in 3 projections.  There is no evidence of any loosening or acute change.  This nice alignment.  Joint spaces are symmetrical.  There is a nice glue mantle on all 3 components.    PMFS History: Patient Active Problem List   Diagnosis Date Noted   Abnormal weight gain 07/04/2020   Epigastric pain 07/04/2020   Pruritus ani 07/04/2020   Prediabetes 02/23/2020   S/P total knee replacement using cement, left 11/20/2016   Osteopenia 02/28/2015   Left lumbar radiculitis 02/28/2015   Acute blood loss anemia 09/20/2014   Primary osteoarthritis of right knee 09/14/2014   Osteoarthritis of left knee 09/14/2014   Vitamin B 12  deficiency 07/29/2014   Obesity (BMI 30-39.9) 12/08/2013   Osteoarthritis, knee [id number]   Metabolic syndrome [id number]   DVT, HX OF 02/10/2010   Vitamin D deficiency 08/25/2007   Disorder of bone and cartilage 04/08/2007   Hypothyroidism 11/18/2006   ANEMIA-NOS 11/18/2006   Essential hypertension 11/18/2006   GERD 11/18/2006   Past Medical History:  Diagnosis Date   Anemia 2005   hb 11.5   Arthritis    DVT (deep venous thrombosis) ([place] [facility])    2003, coumadin x 6 mon   GERD (gastroesophageal reflux disease) 2001   no problem now   Hypertension    Hypothyroidism    off meds   Neuromuscular disorder ([facility])    painful in L foot, being started on Gabapentin- 11/08/2016   Osteopenia    Vegetarian diet    eats fish and eggs; no meat    Family History  Problem Relation Age of Onset   Diabetes Mother    Heart failure Father    Diabetes Other    Alcohol abuse Other     Past Surgical History:  Procedure Laterality Date   JOINT REPLACEMENT     KNEE ARTHROSCOPY Right 03/16/2003   OOPHORECTOMY     "think it was my left"   TOTAL KNEE ARTHROPLASTY Right 09/14/2014   Procedure: RIGHT TOTAL KNEE ARTHROPLASTY;  Surgeon: Garald Balding, MD;  Location: [facility];  Service: Orthopedics;  Laterality: Right;   TOTAL KNEE ARTHROPLASTY Left 11/20/2016   TOTAL KNEE ARTHROPLASTY Left 11/20/2016   Procedure: LEFT TOTAL KNEE ARTHROPLASTY;  Surgeon: Garald Balding, MD;  Location: [facility];  Service: Orthopedics;  Laterality: Left;   Social History   Occupational History    Comment: retired  Tobacco Use   Smoking status: Never   Smokeless tobacco: Never  Vaping Use   Vaping Use: Never used  Substance and Sexual Activity   Alcohol use: No   Drug use: No   Sexual activity: Not on file     Garald Balding, MD   Note - This record has been created using Editor, commissioning.  Chart creation errors have been sought, but may not always  have been located. Such creation errors do not reflect  on  the standard of medical care.

## 2020-12-21 DIAGNOSIS — M545 Low back pain, unspecified: Secondary | ICD-10-CM | POA: Diagnosis not present

## 2020-12-21 DIAGNOSIS — M5416 Radiculopathy, lumbar region: Secondary | ICD-10-CM | POA: Diagnosis not present

## 2021-01-20 ENCOUNTER — Encounter (INDEPENDENT_AMBULATORY_CARE_PROVIDER_SITE_OTHER): Payer: Medicare Other | Admitting: Ophthalmology

## 2021-01-20 ENCOUNTER — Other Ambulatory Visit: Payer: Self-pay

## 2021-01-20 DIAGNOSIS — H35033 Hypertensive retinopathy, bilateral: Secondary | ICD-10-CM | POA: Diagnosis not present

## 2021-01-20 DIAGNOSIS — H3562 Retinal hemorrhage, left eye: Secondary | ICD-10-CM | POA: Diagnosis not present

## 2021-01-20 DIAGNOSIS — H33302 Unspecified retinal break, left eye: Secondary | ICD-10-CM

## 2021-01-20 DIAGNOSIS — H353122 Nonexudative age-related macular degeneration, left eye, intermediate dry stage: Secondary | ICD-10-CM

## 2021-01-20 DIAGNOSIS — I10 Essential (primary) hypertension: Secondary | ICD-10-CM

## 2021-01-20 DIAGNOSIS — H353111 Nonexudative age-related macular degeneration, right eye, early dry stage: Secondary | ICD-10-CM

## 2021-01-20 DIAGNOSIS — H43813 Vitreous degeneration, bilateral: Secondary | ICD-10-CM | POA: Diagnosis not present

## 2021-02-06 DIAGNOSIS — M5416 Radiculopathy, lumbar region: Secondary | ICD-10-CM | POA: Diagnosis not present

## 2021-02-06 DIAGNOSIS — M48061 Spinal stenosis, lumbar region without neurogenic claudication: Secondary | ICD-10-CM | POA: Diagnosis not present

## 2021-02-06 DIAGNOSIS — M47816 Spondylosis without myelopathy or radiculopathy, lumbar region: Secondary | ICD-10-CM | POA: Diagnosis not present

## 2021-02-06 DIAGNOSIS — Z6832 Body mass index (BMI) 32.0-32.9, adult: Secondary | ICD-10-CM | POA: Diagnosis not present

## 2021-02-06 DIAGNOSIS — I10 Essential (primary) hypertension: Secondary | ICD-10-CM | POA: Diagnosis not present

## 2021-02-28 DIAGNOSIS — M5416 Radiculopathy, lumbar region: Secondary | ICD-10-CM | POA: Diagnosis not present

## 2021-03-10 ENCOUNTER — Ambulatory Visit (INDEPENDENT_AMBULATORY_CARE_PROVIDER_SITE_OTHER): Payer: Medicare Other | Admitting: Family Medicine

## 2021-03-10 ENCOUNTER — Encounter: Payer: Self-pay | Admitting: Family Medicine

## 2021-03-10 ENCOUNTER — Other Ambulatory Visit: Payer: Self-pay

## 2021-03-10 VITALS — BP 142/70 | HR 62 | Temp 98.2°F | Wt 171.8 lb

## 2021-03-10 DIAGNOSIS — H6980 Other specified disorders of Eustachian tube, unspecified ear: Secondary | ICD-10-CM

## 2021-03-10 DIAGNOSIS — H7291 Unspecified perforation of tympanic membrane, right ear: Secondary | ICD-10-CM | POA: Diagnosis not present

## 2021-03-10 DIAGNOSIS — H6990 Unspecified Eustachian tube disorder, unspecified ear: Secondary | ICD-10-CM

## 2021-03-10 MED ORDER — FLUTICASONE PROPIONATE 50 MCG/ACT NA SUSP: 1.0000 | Freq: Every day | NASAL | 0 refills | Status: DC

## 2021-03-10 NOTE — Progress Notes (Signed)
Subjective:    Patient ID: Kimberly [name], female    DOB: 06/17/1950, 70 y.o.   MRN: [id number]  Chief Complaint  Patient presents with   Ear Fullness    Right ear looks like it is plugged. About 6-7 days ago. Has not used anything.    HPI Patient was seen today for acute concern, followed by Dr. Elease Hashimoto.  Patient endorses right ear popping sound when breathing through nose x 1 wk. endorses subtle change in hearing.  Patient denies pain/discomfort in the right ear, discharge from right ear, history of seasonal allergies, recent viral infection, fever, chills, headache, sore throat, rhinorrhea.  Pt flew to [place] last week when sensation started.  Tried Allegra which made her feel dried out.  Past Medical History:  Diagnosis Date   Anemia 2005   hb 11.5   Arthritis    DVT (deep venous thrombosis) ([facility])    2003, coumadin x 6 mon   GERD (gastroesophageal reflux disease) 2001   no problem now   Hypertension    Hypothyroidism    off meds   Neuromuscular disorder ([facility])    painful in L foot, being started on Gabapentin- 11/08/2016   Osteopenia    Vegetarian diet    eats fish and eggs; no meat    Allergies  Allergen Reactions   Enoxaparin Sodium Itching and Swelling    SWELLING REACTION UNSPECIFIED     ROS General: Denies fever, chills, night sweats, changes in weight, changes in appetite HEENT: Denies headaches, ear pain, changes in vision, rhinorrhea, sore throat  + right ear popping, decreased hearing in right CV: Denies CP, palpitations, SOB, orthopnea Pulm: Denies SOB, cough, wheezing GI: Denies abdominal pain, nausea, vomiting, diarrhea, constipation GU: Denies dysuria, hematuria, frequency, vaginal discharge Msk: Denies muscle cramps, joint pains Neuro: Denies weakness, numbness, tingling Skin: Denies rashes, bruising Psych: Denies depression, anxiety, hallucinations      Objective:    Blood pressure (!) 142/70, pulse 62, temperature 98.2 F (36.8 C), temperature  source Oral, weight 171 lb 12.8 oz (77.9 kg), last menstrual period 05/14/1998, SpO2 99 %.  Gen. Pleasant, well-nourished, in no distress, normal affect   HEENT: [place]/AT, face symmetric, conjunctiva clear, no scleral icterus, PERRLA, EOMI, nares patent without drainage, pharynx without erythema or exudate.  Left external ear and canal normal.  Left TM dull and flat.  Right external ear and canal without drainage or debris.  Right TM flat, dull, gray with moderate sized perforation.  No suppurative fluid noted behind R TM. Lungs: no accessory muscle use Cardiovascular: RRR, no peripheral edema Neuro:  A&Ox3, CN II-XII intact, normal gait Skin:  Warm, no lesions/ rash   Wt Readings from Last 3 Encounters:  07/18/20 173 lb 4.8 oz (78.6 kg)  07/04/20 173 lb (78.5 kg)  06/06/20 172 lb (78 kg)    Lab Results  Component Value Date   WBC 6.5 07/29/2019   HGB 12.5 07/29/2019   HCT 36 07/29/2019   PLT 266 07/29/2019   GLUCOSE 109 (H) 07/18/2020   CHOL 176 07/18/2020   TRIG 91.0 07/18/2020   HDL 46.50 07/18/2020   LDLDIRECT 142.2 03/20/2006   LDLCALC 111 (H) 07/18/2020   ALT 19 07/18/2020   AST 19 07/18/2020   NA 138 07/18/2020   K 4.2 07/18/2020   CL 104 07/18/2020   CREATININE 0.56 07/18/2020   BUN 11 07/18/2020   CO2 27 07/18/2020   TSH 2.83 03/28/2020   INR 1.07 11/08/2016   HGBA1C 5.9 (A)  07/18/2020   HGBA1C 5.9 07/18/2020   HGBA1C 5.9 07/18/2020   HGBA1C 5.9 07/18/2020    Assessment/Plan:  Perforation of right tympanic membrane -Advised likely cause by recent change of altitude when flying and eustachian tube dysfunction -Discussed supportive care including avoiding submerging head in water or getting other fluids in ear. -Given handout -Given precautions -For increased or worsening symptoms ENT referral -Given handout  Dysfunction of Eustachian tube, unspecified laterality -Advised OTC antihistamines helpful -As Allegra caused patient to feel dried out advised to try  half a tab daily or qod versus trying another allergy medication such as Zyrtec or Claritin -Also discussed using Flonase nasal spray. -Given handout - Plan: fluticasone (FLONASE) 50 MCG/ACT nasal spray  F/u as needed  Grier Mitts, MD

## 2021-03-15 ENCOUNTER — Telehealth: Payer: Self-pay | Admitting: Family Medicine

## 2021-03-15 NOTE — Telephone Encounter (Signed)
Patient got advice from Dr. Volanda Napoleon to take Allegra and Flutiasone Nasal Spray.  She states her R ear is still hurting and feels plugged up.  She is wanting to see if she can get a different medication.  She is requesting a call back.  Dr. Elease Hashimoto is booked until Monday.

## 2021-03-15 NOTE — Telephone Encounter (Signed)
Please advise 

## 2021-03-16 DIAGNOSIS — N950 Postmenopausal bleeding: Secondary | ICD-10-CM | POA: Diagnosis not present

## 2021-03-16 DIAGNOSIS — R9389 Abnormal findings on diagnostic imaging of other specified body structures: Secondary | ICD-10-CM | POA: Diagnosis not present

## 2021-03-16 DIAGNOSIS — N841 Polyp of cervix uteri: Secondary | ICD-10-CM | POA: Diagnosis not present

## 2021-03-16 NOTE — Telephone Encounter (Signed)
Spoke with the patient. She stated she has not tried mucinex yet and will pick up some from her pharmacy.

## 2021-03-23 ENCOUNTER — Encounter (HOSPITAL_BASED_OUTPATIENT_CLINIC_OR_DEPARTMENT_OTHER): Payer: Self-pay | Admitting: Obstetrics & Gynecology

## 2021-03-23 ENCOUNTER — Other Ambulatory Visit: Payer: Self-pay | Admitting: Family Medicine

## 2021-03-23 ENCOUNTER — Ambulatory Visit (HOSPITAL_COMMUNITY)
Admission: RE | Admit: 2021-03-23 | Discharge: 2021-03-23 | Disposition: A | Discharge status: Home / Self Care | Payer: Medicare Other | Source: Ambulatory Visit | Attending: Cardiovascular Disease | Admitting: Cardiovascular Disease

## 2021-03-23 ENCOUNTER — Other Ambulatory Visit: Payer: Self-pay

## 2021-03-23 ENCOUNTER — Other Ambulatory Visit: Payer: Self-pay | Admitting: Obstetrics & Gynecology

## 2021-03-23 ENCOUNTER — Ambulatory Visit (HOSPITAL_COMMUNITY): Payer: Medicare Other

## 2021-03-23 ENCOUNTER — Other Ambulatory Visit (HOSPITAL_COMMUNITY): Payer: Self-pay | Admitting: Cardiovascular Disease

## 2021-03-23 DIAGNOSIS — R079 Chest pain, unspecified: Secondary | ICD-10-CM | POA: Insufficient documentation

## 2021-03-23 DIAGNOSIS — I10 Essential (primary) hypertension: Secondary | ICD-10-CM | POA: Diagnosis not present

## 2021-03-23 DIAGNOSIS — R0602 Shortness of breath: Secondary | ICD-10-CM | POA: Diagnosis not present

## 2021-03-23 DIAGNOSIS — R072 Precordial pain: Secondary | ICD-10-CM | POA: Diagnosis not present

## 2021-03-23 DIAGNOSIS — M25562 Pain in left knee: Secondary | ICD-10-CM | POA: Diagnosis not present

## 2021-03-23 DIAGNOSIS — I340 Nonrheumatic mitral (valve) insufficiency: Secondary | ICD-10-CM | POA: Diagnosis not present

## 2021-03-23 DIAGNOSIS — N950 Postmenopausal bleeding: Secondary | ICD-10-CM | POA: Diagnosis not present

## 2021-03-23 LAB — ECHOCARDIOGRAM COMPLETE
Area-P 1/2: 3.91 cm2
S' Lateral: 2.2 cm

## 2021-03-23 NOTE — Progress Notes (Signed)
  Echocardiogram 2D Echocardiogram has been performed.  Kimberly [name] 03/23/2021, 4:14 PM

## 2021-03-24 ENCOUNTER — Encounter (HOSPITAL_BASED_OUTPATIENT_CLINIC_OR_DEPARTMENT_OTHER): Payer: Self-pay | Admitting: Obstetrics & Gynecology

## 2021-03-24 ENCOUNTER — Ambulatory Visit (INDEPENDENT_AMBULATORY_CARE_PROVIDER_SITE_OTHER): Payer: Medicare Other | Admitting: Family Medicine

## 2021-03-24 ENCOUNTER — Other Ambulatory Visit: Payer: Self-pay

## 2021-03-24 VITALS — BP 122/68 | HR 100 | Temp 97.8°F | Wt 169.6 lb

## 2021-03-24 DIAGNOSIS — N950 Postmenopausal bleeding: Secondary | ICD-10-CM | POA: Diagnosis not present

## 2021-03-24 DIAGNOSIS — R634 Abnormal weight loss: Secondary | ICD-10-CM | POA: Diagnosis not present

## 2021-03-24 DIAGNOSIS — I10 Essential (primary) hypertension: Secondary | ICD-10-CM | POA: Diagnosis not present

## 2021-03-24 LAB — CBC WITH DIFFERENTIAL/PLATELET
Basophils Absolute: 0 10*3/uL (ref 0.0–0.1)
Basophils Relative: 0.4 % (ref 0.0–3.0)
Eosinophils Absolute: 0.2 10*3/uL (ref 0.0–0.7)
Eosinophils Relative: 2.5 % (ref 0.0–5.0)
HCT: 40.2 % (ref 36.0–46.0)
Hemoglobin: 13.3 g/dL (ref 12.0–15.0)
Lymphocytes Relative: 26.3 % (ref 12.0–46.0)
Lymphs Abs: 1.8 10*3/uL (ref 0.7–4.0)
MCHC: 33.2 g/dL (ref 30.0–36.0)
MCV: 91.5 fl (ref 78.0–100.0)
Monocytes Absolute: 0.4 10*3/uL (ref 0.1–1.0)
Monocytes Relative: 6.1 % (ref 3.0–12.0)
Neutro Abs: 4.3 10*3/uL (ref 1.4–7.7)
Neutrophils Relative %: 64.7 % (ref 43.0–77.0)
Platelets: 237 10*3/uL (ref 150.0–400.0)
RBC: 4.39 Mil/uL (ref 3.87–5.11)
RDW: 13.5 % (ref 11.5–15.5)
WBC: 6.7 10*3/uL (ref 4.0–10.5)

## 2021-03-24 LAB — COMPREHENSIVE METABOLIC PANEL
ALT: 38 U/L — ABNORMAL HIGH (ref 0–35)
AST: 32 U/L (ref 0–37)
Albumin: 4 g/dL (ref 3.5–5.2)
Alkaline Phosphatase: 56 U/L (ref 39–117)
BUN: 13 mg/dL (ref 6–23)
CO2: 25 mEq/L (ref 19–32)
Calcium: 9.7 mg/dL (ref 8.4–10.5)
Chloride: 103 mEq/L (ref 96–112)
Creatinine, Ser: 0.67 mg/dL (ref 0.40–1.20)
GFR: 87.32 mL/min (ref >60.00)
Glucose, Bld: 188 mg/dL — ABNORMAL HIGH (ref 70–99)
Potassium: 3.8 mEq/L (ref 3.5–5.1)
Sodium: 136 mEq/L (ref 135–145)
Total Bilirubin: 0.6 mg/dL (ref 0.2–1.2)
Total Protein: 7.6 g/dL (ref 6.0–8.3)

## 2021-03-24 LAB — TSH: TSH: 2.38 u[IU]/mL (ref 0.35–5.50)

## 2021-03-24 LAB — T4, FREE: Free T4: 1.09 ng/dL (ref 0.60–1.60)

## 2021-03-24 NOTE — Progress Notes (Addendum)
ADDENDUM:  Received current ekg tracing and office visit note from Dr Doylene Canard office via fax, place with chart.   Spoke w/ via phone for pre-op interview--- pt and pt's husband Lab needs dos----    cbc, bmp   (entered order for ekg if have not received current one done 03-23-2021 by fax)         Lab results------ pt stated had an ekg done yesterday at dr a. Doylene Canard office (requested tracing to be faxed) COVID test -----patient states asymptomatic no test needed Arrive at -------  0815 on 03-27-2021 NPO after MN NO Solid Food.  Clear liquids from MN until--- 0715 Med rec completed Medications to take morning of surgery ----- synthroid, prilosec Diabetic medication ----- n/a Patient instructed no nail polish to be worn day of surgery Patient instructed to bring photo id and insurance card day of surgery Patient aware to have Driver (ride ) / caregiver  for 24 hours after surgery --husband, Sat Patient Special Instructions -----n/a Pre-Op special Istructions ----- called and requested office note and 12 lead ekg tracing done at dr a. Doylene Canard office 03-23-2021 to be faxed. Patient verbalized understanding of instructions that were given at this phone interview. Patient denies shortness of breath, chest pain, fever, cough at this phone interview.    Anesthesia Review: HTN;  pt had cardiologist evaluation by dr Doylene Canard 03-23-2021 for chest pain/ sob, called and requested office note to be faxed.  Pt had echo done in epic , normal .  Pt stated dr Doylene Canard call her yesterday and told everything was okay.  Pt denies chest pain/ sob.  PCP: Dr Elease Hashimoto (lov 07-18-2020 epic) Cardiologist : Dr Doylene Canard 03-23-2021 requested note Chest x-ray :  EKG : epic 11-08-2016/  ekg 03-23-2021 requested tracing to be faxed Echo : 03-23-2021 epic Stress test: nuclear 05-13-2019 epic Cardiac Cath :  no Activity level:  denies sob w/ any activity Sleep Study/ CPAP : no Fasting Blood Sugar :      / Checks Blood  Sugar -- times a day:  Pre-diabetes Blood Thinner/ Instructions /Last Dose: no ASA / Instructions/ Last Dose :  no

## 2021-03-24 NOTE — Progress Notes (Signed)
Established Patient Office Visit  Subjective:  Patient ID: Kimberly [name], female    DOB: 1950/10/24  Age: 70 y.o. MRN: [id number]  CC:  Chief Complaint  Patient presents with   Hypertension    BP was 178/88 yesterday at another appointment    HPI Kimberly [name] presents for  Medical follow-up.  She had recent episode about 9 days ago of post menopausal vaginal bleeding.  She states she went through about 2 pads per day.  She went to gynecologist and had ultrasound and apparently had endocervical polyp which was removed yesterday.  She also had endometrial biopsy which did not show any cancer.  She apparently has 1 other polyp that will be removed early next week.  She is currently on medroxyprogesterone.  Her bleeding has subsided.  She has had some stress issues regarding recent events and she thinks that may have decreased her appetite.  She is here predominantly because she is concerned about a 4 pound weight loss.  She has had occasional palpitations.  She saw cardiologist yesterday and had echocardiogram and those results were reviewed.  She had normal EF and no major valvular abnormalities or other significant abnormalities. Past Medical History:  Diagnosis Date   DDD (degenerative disc disease), lumbar    GERD (gastroesophageal reflux disease)    History of anemia    History of COVID-19    11-11-2020 positive result in care everywhere   History of DVT (deep vein thrombosis) 2003   completed coumadin x 6 mon   Hypertension    Hypothyroidism    Lattice degeneration of right retina    Neuromuscular disorder ([facility])    painful in L foot, being started on Gabapentin- 11/08/2016   OA (osteoarthritis)    Osteopenia    PMB (postmenopausal bleeding)    Pre-diabetes    Vegetarian diet    eats fish and eggs; no meat    Past Surgical History:  Procedure Laterality Date   COLONOSCOPY  2016   KNEE ARTHROSCOPY Right 03/16/2003   OOPHORECTOMY     "think it was my left"   TOTAL KNEE  ARTHROPLASTY Right 09/14/2014   Procedure: RIGHT TOTAL KNEE ARTHROPLASTY;  Surgeon: Garald Balding, MD;  Location: [facility];  Service: Orthopedics;  Laterality: Right;   TOTAL KNEE ARTHROPLASTY Left 11/20/2016   Procedure: LEFT TOTAL KNEE ARTHROPLASTY;  Surgeon: Garald Balding, MD;  Location: [facility];  Service: Orthopedics;  Laterality: Left;    Family History  Problem Relation Age of Onset   Diabetes Mother    Heart failure Father    Diabetes Other    Alcohol abuse Other     Social History   Socioeconomic History   Marital status: Married    Spouse name: Not on file   Number of children: 2   Years of education: Not on file   Highest education level: Master's degree (e.g., MA, MS, MEng, MEd, MSW, MBA)  Occupational History    Comment: retired  Tobacco Use   Smoking status: Never   Smokeless tobacco: Never  Vaping Use   Vaping Use: Never used  Substance and Sexual Activity   Alcohol use: No   Drug use: No   Sexual activity: Not on file  Other Topics Concern   Not on file  Social History Narrative   Married   Never smoker   Alcohol use-no   Regular exercise- yes. Walks regularly   Brother-alcohol   Illicit drug- no   Daily Caffeine use- 4 cups  of tea daily   Social Determinants of Health   Financial Resource Strain: Low Risk    Difficulty of Paying Living Expenses: Not hard at all  Food Insecurity: No Food Insecurity   Worried About Charity fundraiser in the Last Year: Never true   Arboriculturist in the Last Year: Never true  Transportation Needs: No Transportation Needs   Lack of Transportation (Medical): No   Lack of Transportation (Non-Medical): No  Physical Activity: Sufficiently Active   Days of Exercise per Week: 5 days   Minutes of Exercise per Session: 50 min  Stress: No Stress Concern Present   Feeling of Stress : Not at all  Social Connections: Moderately Integrated   Frequency of Communication with Friends and Family: More than three times a  week   Frequency of Social Gatherings with Friends and Family: Three times a week   Attends Religious Services: More than 4 times per year   Active Member of Clubs or Organizations: No   Attends Archivist Meetings: Never   Marital Status: Married  Human resources officer Violence: Not At Risk   Fear of Current or Ex-Partner: No   Emotionally Abused: No   Physically Abused: No   Sexually Abused: No    Outpatient Medications Prior to Visit  Medication Sig Dispense Refill   acetaminophen (TYLENOL) 500 MG tablet Take 1,000 mg by mouth daily as needed for moderate pain.     amoxicillin (AMOXIL) 500 MG capsule TAKE 4 CAPSULES 1 HOUR PRIOR TO DENTAL PROCEDURE (Patient taking differently: as directed. TAKE 4 CAPSULES 1 HOUR PRIOR TO DENTAL PROCEDURE) 12 capsule 1   Calcium Carbonate (CALCIUM 600 PO) Take 600 mg by mouth daily.     cholecalciferol (VITAMIN D) 1000 units tablet Take 1,000 Units by mouth daily.     diclofenac sodium (VOLTAREN) 1 % GEL Apply 4 g 3 (three) times daily as needed topically. 3 Tube 3   fluticasone (FLONASE) 50 MCG/ACT nasal spray Place 1 spray into both nostrils daily. 16 g 0   irbesartan (AVAPRO) 300 MG tablet TAKE 1 TABLET BY MOUTH EVERY DAY 90 tablet 3   levothyroxine (SYNTHROID) 50 MCG tablet TAKE 1 TABLET BY MOUTH EVERY DAY BEFORE BREAKFAST 90 tablet 3   Magnesium 200 MG TABS Take 1 tablet by mouth daily.     omeprazole (PRILOSEC) 40 MG capsule      pregabalin (LYRICA) 100 MG capsule Take 1 capsule (100 mg total) by mouth at bedtime. 90 capsule 1   No facility-administered medications prior to visit.    Allergies  Allergen Reactions   Enoxaparin Sodium Itching and Swelling    SWELLING REACTION UNSPECIFIED     ROS Review of Systems  Constitutional:  Positive for appetite change and unexpected weight change. Negative for fatigue.  Eyes:  Negative for visual disturbance.  Respiratory:  Negative for cough, chest tightness, shortness of breath and  wheezing.   Cardiovascular:  Positive for palpitations. Negative for chest pain and leg swelling.  Genitourinary:  Negative for dysuria and hematuria.  Neurological:  Negative for dizziness, seizures, syncope, weakness, light-headedness and headaches.     Objective:    Physical Exam Constitutional:      Appearance: She is well-developed.  Eyes:     Pupils: Pupils are equal, round, and reactive to light.  Neck:     Thyroid: No thyromegaly.     Vascular: No JVD.  Cardiovascular:     Rate and Rhythm: Normal rate and  regular rhythm.     Heart sounds:    No gallop.  Pulmonary:     Effort: Pulmonary effort is normal. No respiratory distress.     Breath sounds: Normal breath sounds. No wheezing or rales.  Musculoskeletal:     Cervical back: Neck supple.     Right lower leg: No edema.     Left lower leg: No edema.  Neurological:     Mental Status: She is alert.    BP 122/68 (BP Location: Left Arm, Patient Position: Sitting, Cuff Size: Normal)   Pulse 100   Temp 97.8 F (36.6 C) (Oral)   Wt 169 lb 9.6 oz (76.9 kg)   LMP 05/14/1998   SpO2 98%   BMI 32.05 kg/m  Wt Readings from Last 3 Encounters:  03/24/21 169 lb 9.6 oz (76.9 kg)  03/10/21 171 lb 12.8 oz (77.9 kg)  07/18/20 173 lb 4.8 oz (78.6 kg)     Health Maintenance Due  Topic Date Due   Zoster Vaccines- Shingrix (1 of 2) Never done   TETANUS/TDAP  02/11/2017   COVID-19 Vaccine (4 - Booster for Pfizer series) 03/16/2020   INFLUENZA VACCINE  12/12/2020    There are no preventive care reminders to display for this patient.  Lab Results  Component Value Date   TSH 2.83 03/28/2020   Lab Results  Component Value Date   WBC 6.5 07/29/2019   HGB 12.5 07/29/2019   HCT 36 07/29/2019   MCV 92.3 12/18/2016   PLT 266 07/29/2019   Lab Results  Component Value Date   NA 138 07/18/2020   K 4.2 07/18/2020   CO2 27 07/18/2020   GLUCOSE 109 (H) 07/18/2020   BUN 11 07/18/2020   CREATININE 0.56 07/18/2020   BILITOT  0.9 07/18/2020   ALKPHOS 58 07/18/2020   AST 19 07/18/2020   ALT 19 07/18/2020   PROT 7.1 07/18/2020   ALBUMIN 3.8 07/18/2020   CALCIUM 9.0 07/18/2020   ANIONGAP 6 11/23/2016   GFR 91.62 07/18/2020   Lab Results  Component Value Date   CHOL 176 07/18/2020   Lab Results  Component Value Date   HDL 46.50 07/18/2020   Lab Results  Component Value Date   LDLCALC 111 (H) 07/18/2020   Lab Results  Component Value Date   TRIG 91.0 07/18/2020   Lab Results  Component Value Date   CHOLHDL 4 07/18/2020   Lab Results  Component Value Date   HGBA1C 5.9 (A) 07/18/2020   HGBA1C 5.9 07/18/2020   HGBA1C 5.9 07/18/2020   HGBA1C 5.9 07/18/2020      Assessment & Plan:   Problem List Items Addressed This Visit   None Visit Diagnoses     Weight loss    -  Primary   Relevant Orders   CBC with Differential/Platelet   TSH   CMP   T4, Free   Postmenopausal vaginal bleeding       Relevant Orders   CBC with Differential/Platelet     Patient has some modest weight loss by her scales but she states by her home scales about 6 to 7 pounds down from her baseline.  Does have increased stress issues as above-which may have affected her recent appetite.  Her blood pressure is elevated today with repeat left arm seated at rest 140/68.  She states she had reading yesterday 178/88.  She is on medroxyprogesterone which could be having some effect  -Check labs with CBC, comprehensive metabolic panel, TSH -Bring back in  2 weeks when she is off the medroxyprogesterone and reassess blood pressure.  If still up at that point consider either addition of low-dose thiazide or possible low-dose calcium channel blocker  No orders of the defined types were placed in this encounter.   Follow-up: No follow-ups on file.    Carolann Littler, MD

## 2021-03-27 ENCOUNTER — Ambulatory Visit (HOSPITAL_BASED_OUTPATIENT_CLINIC_OR_DEPARTMENT_OTHER)
Admission: RE | Admit: 2021-03-27 | Discharge: 2021-03-27 | Disposition: A | Discharge status: Home / Self Care | Payer: Medicare Other | Source: Ambulatory Visit | Attending: Obstetrics & Gynecology | Admitting: Obstetrics & Gynecology

## 2021-03-27 ENCOUNTER — Other Ambulatory Visit: Payer: Self-pay | Admitting: Family Medicine

## 2021-03-27 ENCOUNTER — Encounter (HOSPITAL_BASED_OUTPATIENT_CLINIC_OR_DEPARTMENT_OTHER): Payer: Self-pay | Admitting: Obstetrics & Gynecology

## 2021-03-27 ENCOUNTER — Other Ambulatory Visit: Payer: Self-pay

## 2021-03-27 ENCOUNTER — Ambulatory Visit (HOSPITAL_BASED_OUTPATIENT_CLINIC_OR_DEPARTMENT_OTHER): Payer: Medicare Other | Admitting: Anesthesiology

## 2021-03-27 ENCOUNTER — Encounter (HOSPITAL_BASED_OUTPATIENT_CLINIC_OR_DEPARTMENT_OTHER)
Admission: RE | Disposition: A | Discharge status: Home / Self Care | Payer: Self-pay | Source: Ambulatory Visit | Attending: Obstetrics & Gynecology

## 2021-03-27 DIAGNOSIS — N950 Postmenopausal bleeding: Secondary | ICD-10-CM | POA: Diagnosis not present

## 2021-03-27 DIAGNOSIS — N841 Polyp of cervix uteri: Secondary | ICD-10-CM | POA: Diagnosis not present

## 2021-03-27 DIAGNOSIS — D62 Acute posthemorrhagic anemia: Secondary | ICD-10-CM | POA: Diagnosis not present

## 2021-03-27 DIAGNOSIS — I10 Essential (primary) hypertension: Secondary | ICD-10-CM | POA: Diagnosis not present

## 2021-03-27 DIAGNOSIS — E559 Vitamin D deficiency, unspecified: Secondary | ICD-10-CM | POA: Diagnosis not present

## 2021-03-27 DIAGNOSIS — N840 Polyp of corpus uteri: Secondary | ICD-10-CM | POA: Diagnosis not present

## 2021-03-27 HISTORY — DX: Prediabetes: R73.03

## 2021-03-27 HISTORY — DX: Personal history of COVID-19: Z86.16

## 2021-03-27 HISTORY — DX: Unspecified osteoarthritis, unspecified site: M19.90

## 2021-03-27 HISTORY — PX: DILATATION & CURETTAGE/HYSTEROSCOPY WITH MYOSURE: SHX6511

## 2021-03-27 HISTORY — DX: Other intervertebral disc degeneration, lumbar region without mention of lumbar back pain or lower extremity pain: M51.369

## 2021-03-27 HISTORY — DX: Postmenopausal bleeding: N95.0

## 2021-03-27 HISTORY — DX: Other intervertebral disc degeneration, lumbar region: M51.36

## 2021-03-27 HISTORY — DX: Lattice degeneration of retina, right eye: H35.411

## 2021-03-27 HISTORY — DX: Personal history of diseases of the blood and blood-forming organs and certain disorders involving the immune mechanism: Z86.2

## 2021-03-27 LAB — BASIC METABOLIC PANEL
Anion gap: 8 (ref 5–15)
BUN: 10 mg/dL (ref 8–23)
CO2: 24 mmol/L (ref 22–32)
Calcium: 9.3 mg/dL (ref 8.9–10.3)
Chloride: 101 mmol/L (ref 98–111)
Creatinine, Ser: 0.62 mg/dL (ref 0.44–1.00)
GFR, Estimated: >60 mL/min (ref >60)
Glucose, Bld: 139 mg/dL — ABNORMAL HIGH (ref 70–99)
Potassium: 4 mmol/L (ref 3.5–5.1)
Sodium: 133 mmol/L — ABNORMAL LOW (ref 135–145)

## 2021-03-27 LAB — CBC
HCT: 41.8 % (ref 36.0–46.0)
Hemoglobin: 14.1 g/dL (ref 12.0–15.0)
MCH: 30.7 pg (ref 26.0–34.0)
MCHC: 33.7 g/dL (ref 30.0–36.0)
MCV: 90.9 fL (ref 80.0–100.0)
Platelets: 250 10*3/uL (ref 150–400)
RBC: 4.6 MIL/uL (ref 3.87–5.11)
RDW: 13 % (ref 11.5–15.5)
WBC: 6.7 10*3/uL (ref 4.0–10.5)
nRBC: 0 % (ref 0.0–0.2)

## 2021-03-27 SURGERY — DILATATION & CURETTAGE/HYSTEROSCOPY WITH MYOSURE
Anesthesia: General | Site: Uterus

## 2021-03-27 MED ORDER — PROPOFOL 10 MG/ML IV BOLUS
INTRAVENOUS | Status: AC
  Filled 2021-03-27: qty 20

## 2021-03-27 MED ORDER — ONDANSETRON HCL 4 MG/2ML IJ SOLN
INTRAMUSCULAR | Status: DC | PRN
  Administered 2021-03-27: 4 mg via INTRAVENOUS

## 2021-03-27 MED ORDER — LACTATED RINGERS IV SOLN: INTRAVENOUS | Status: DC

## 2021-03-27 MED ORDER — KETOROLAC TROMETHAMINE 30 MG/ML IJ SOLN
INTRAMUSCULAR | Status: AC
  Filled 2021-03-27: qty 1

## 2021-03-27 MED ORDER — DEXAMETHASONE SODIUM PHOSPHATE 10 MG/ML IJ SOLN
INTRAMUSCULAR | Status: AC
  Filled 2021-03-27: qty 1

## 2021-03-27 MED ORDER — POVIDONE-IODINE 10 % EX SWAB: 2.0000 "application " | Freq: Once | CUTANEOUS | Status: DC

## 2021-03-27 MED ORDER — LIDOCAINE 2% (20 MG/ML) 5 ML SYRINGE
INTRAMUSCULAR | Status: DC | PRN
  Administered 2021-03-27: 60 mg via INTRAVENOUS

## 2021-03-27 MED ORDER — FENTANYL CITRATE (PF) 100 MCG/2ML IJ SOLN: 25.0000 ug | INTRAMUSCULAR | Status: DC | PRN

## 2021-03-27 MED ORDER — CEFAZOLIN SODIUM-DEXTROSE 2-4 GM/100ML-% IV SOLN
INTRAVENOUS | Status: AC
  Filled 2021-03-27: qty 100

## 2021-03-27 MED ORDER — SODIUM CHLORIDE 0.9 % IR SOLN
Status: DC | PRN
  Administered 2021-03-27: 3000 mL

## 2021-03-27 MED ORDER — KETOROLAC TROMETHAMINE 30 MG/ML IJ SOLN
INTRAMUSCULAR | Status: DC | PRN
  Administered 2021-03-27: 30 mg via INTRAVENOUS

## 2021-03-27 MED ORDER — FENTANYL CITRATE (PF) 100 MCG/2ML IJ SOLN
INTRAMUSCULAR | Status: AC
  Filled 2021-03-27: qty 2

## 2021-03-27 MED ORDER — PROPOFOL 10 MG/ML IV BOLUS
INTRAVENOUS | Status: DC | PRN
  Administered 2021-03-27: 50 mg via INTRAVENOUS
  Administered 2021-03-27: 150 mg via INTRAVENOUS

## 2021-03-27 MED ORDER — FENTANYL CITRATE (PF) 100 MCG/2ML IJ SOLN
INTRAMUSCULAR | Status: DC | PRN
  Administered 2021-03-27: 50 ug via INTRAVENOUS
  Administered 2021-03-27 (×2): 25 ug via INTRAVENOUS

## 2021-03-27 MED ORDER — DEXAMETHASONE SODIUM PHOSPHATE 4 MG/ML IJ SOLN
INTRAMUSCULAR | Status: DC | PRN
  Administered 2021-03-27: 5 mg via INTRAVENOUS

## 2021-03-27 MED ORDER — LIDOCAINE-EPINEPHRINE 1 %-1:100000 IJ SOLN
INTRAMUSCULAR | Status: DC | PRN
  Administered 2021-03-27: 10 mL

## 2021-03-27 MED ORDER — ACETAMINOPHEN 10 MG/ML IV SOLN: 1000.0000 mg | Freq: Once | INTRAVENOUS | Status: DC | PRN

## 2021-03-27 MED ORDER — ONDANSETRON HCL 4 MG/2ML IJ SOLN
INTRAMUSCULAR | Status: AC
  Filled 2021-03-27: qty 2

## 2021-03-27 MED ORDER — CEFAZOLIN SODIUM-DEXTROSE 2-4 GM/100ML-% IV SOLN
2.0000 g | INTRAVENOUS | Status: AC
  Administered 2021-03-27: 2 g via INTRAVENOUS

## 2021-03-27 MED ORDER — LIDOCAINE 2% (20 MG/ML) 5 ML SYRINGE
INTRAMUSCULAR | Status: AC
  Filled 2021-03-27: qty 5

## 2021-03-27 SURGICAL SUPPLY — 15 items
CATH ROBINSON RED A/P 16FR (CATHETERS) ×2 IMPLANT
DEVICE MYOSURE LITE (MISCELLANEOUS) IMPLANT
DEVICE MYOSURE REACH (MISCELLANEOUS) ×2 IMPLANT
GAUZE 4X4 16PLY ~~LOC~~+RFID DBL (SPONGE) ×4 IMPLANT
GLOVE SURG ENC MOIS LTX SZ7 (GLOVE) ×2 IMPLANT
GLOVE SURG UNDER POLY LF SZ7 (GLOVE) ×4 IMPLANT
GOWN STRL REUS W/TWL LRG LVL3 (GOWN DISPOSABLE) ×2 IMPLANT
IV NS IRRIG 3000ML ARTHROMATIC (IV SOLUTION) ×2 IMPLANT
KIT PROCEDURE FLUENT (KITS) ×2 IMPLANT
KIT TURNOVER CYSTO (KITS) ×2 IMPLANT
PACK VAGINAL MINOR WOMEN LF (CUSTOM PROCEDURE TRAY) ×2 IMPLANT
PAD OB MATERNITY 4.3X12.25 (PERSONAL CARE ITEMS) ×2 IMPLANT
SEAL CERVICAL OMNI LOK (ABLATOR) IMPLANT
SEAL ROD LENS SCOPE MYOSURE (ABLATOR) ×2 IMPLANT
TOWEL OR 17X26 10 PK STRL BLUE (TOWEL DISPOSABLE) ×2 IMPLANT

## 2021-03-27 NOTE — Anesthesia Postprocedure Evaluation (Signed)
Anesthesia Post Note  Patient: Kimberly [name]  Procedure(s) Performed: DILATATION & CURETTAGE/HYSTEROSCOPY/Polypectomy WITH MYOSURE (Uterus)     Patient location during evaluation: PACU Anesthesia Type: General Level of consciousness: awake and alert Pain management: pain level controlled Vital Signs Assessment: post-procedure vital signs reviewed and stable Respiratory status: spontaneous breathing, nonlabored ventilation, respiratory function stable and patient connected to nasal cannula oxygen Cardiovascular status: blood pressure returned to baseline and stable Postop Assessment: no apparent nausea or vomiting Anesthetic complications: no   No notable events documented.  Last Vitals:  Vitals:   03/27/21 1137 03/27/21 1200  BP: 115/60 (!) 135/57  Pulse: 64 62  Resp: 14 15  Temp:  37.5 C  SpO2: 97% 100%    Last Pain:  Vitals:   03/27/21 1200  TempSrc:   PainSc: 0-No pain                 Belenda Cruise P Temari Schooler

## 2021-03-27 NOTE — Anesthesia Procedure Notes (Signed)
Procedure Name: LMA Insertion Date/Time: 03/27/2021 10:36 AM Performed by: Justice Rocher, CRNA Pre-anesthesia Checklist: Patient identified, Emergency Drugs available, Suction available, Patient being monitored and Timeout performed Patient Re-evaluated:Patient Re-evaluated prior to induction Oxygen Delivery Method: Circle system utilized Preoxygenation: Pre-oxygenation with 100% oxygen Induction Type: IV induction Ventilation: Mask ventilation without difficulty LMA: LMA inserted LMA Size: 4.0 Number of attempts: 1 Airway Equipment and Method: Bite block Placement Confirmation: positive ETCO2, breath sounds checked- equal and bilateral and CO2 detector Tube secured with: Tape Dental Injury: Teeth and Oropharynx as per pre-operative assessment

## 2021-03-27 NOTE — H&P (Signed)
Kimberly [name] is an 70 y.o. female with new onset of postmenopausal vaginal bleeding noting flow of blood. No HRT or anticoagulation. Office sono noted normal uterus, possible endometrial polyp and thick EMS at 58mm with slight blood flow and office exam noted cervical polyp.  Endometrial biopsy performed was benign and cervical polyp was also benign. Vaginal bleeding continued, got a bit heavy, so Megestrol started that is helping now. So pt is set up for operative hysteroscopy and D&C today  I reviewed her medical history and meds list   Pertinent Gynecological History: Menses: post-menopausal Bleeding: post menopausal bleeding Contraception: none DES exposure: denies Blood transfusions: none Sexually transmitted diseases: none Last mammogram:upto date this year, normal paps  G2P2   Patient's last menstrual period was 05/14/1998.    Past Medical History:  Diagnosis Date   DDD (degenerative disc disease), lumbar    GERD (gastroesophageal reflux disease)    History of anemia    History of COVID-19    11-11-2020 positive result in care everywhere   History of DVT (deep vein thrombosis) 2003   per pt completed coumadin x 6 mon, never a blood clot prior to 2003 and none since   Hypertension    followed by pcp   (nuclear stress test in epic 05-13-2019, Low risk without ischemia, normal LV and wall motion , ef 68%)   Hypothyroidism    followed by pcp   Lattice degeneration of right retina    OA (osteoarthritis)    Osteopenia    PMB (postmenopausal bleeding)    Pre-diabetes    Vegetarian diet    eats fish and eggs; no meat    Past Surgical History:  Procedure Laterality Date   CATARACT EXTRACTION W/ INTRAOCULAR LENS IMPLANT Bilateral 07/2020   COLONOSCOPY  2016   KNEE ARTHROSCOPY Right 03/16/2003   OOPHORECTOMY Left 1999   benign cyst per pt   TOTAL KNEE ARTHROPLASTY Right 09/14/2014   Procedure: RIGHT TOTAL KNEE ARTHROPLASTY;  Surgeon: Garald Balding, MD;  Location: [place];   Service: Orthopedics;  Laterality: Right;   TOTAL KNEE ARTHROPLASTY Left 11/20/2016   Procedure: LEFT TOTAL KNEE ARTHROPLASTY;  Surgeon: Garald Balding, MD;  Location: [place];  Service: Orthopedics;  Laterality: Left;    Family History  Problem Relation Age of Onset   Diabetes Mother    Heart failure Father    Diabetes Other    Alcohol abuse Other     Social History:  reports that she has never smoked. She has never used smokeless tobacco. She reports that she does not drink alcohol and does not use drugs.  Allergies:  Allergies  Allergen Reactions   Enoxaparin Sodium Itching and Swelling    SWELLING REACTION UNSPECIFIED     No medications prior to admission.    Review of Systems  Height 5\' 1"  (1.549 m), weight 74.8 kg, last menstrual period 05/14/1998. Physical Exam Physical exam:  A&O x 3, no acute distress. Pleasant HEENT neg, no thyromegaly Lungs CTA bilat CV RRR, S1S2 normal Abdo soft, non tender, non acute Extr no edema/ tenderness Pelvic   No results found for this or any previous visit (from the past 24 hour(s)).  No results found.  Assessment/Plan: 70 yo menopausal female with vaginal bleeding. Office biopsy was inactive tissue and small polyp, here for hysteroscopy, polypectomy, possible myosure resection and D&C.  Risks/complications of surgery reviewed incl infection, bleeding, damage to internal organs including bladder, bowels, ureters, blood vessels, other risks from anesthesia, VTE and  delayed complications of any surgery, complications in future surgery reviewed.   Kimberly [name] 03/27/2021,

## 2021-03-27 NOTE — Discharge Instructions (Addendum)
   D & C Home care Instructions:   Personal hygiene:  Used sanitary napkins for vaginal drainage not tampons. Shower or tub bathe the day after your procedure. No douching until bleeding stops. Always wipe from front to back after  Elimination.  Activity: Do not drive or operate any equipment today. The effects of the anesthesia are still present and drowsiness may result. Rest today, not necessarily flat bed rest, just take it easy. You may resume your normal activity in one to 2 days.  Sexual activity: No intercourse for one week or as indicated by your physician  Diet: Eat a light diet as desired this evening. You may resume a regular diet tomorrow.  Return to work: One to 2 days.  General Expectations of your surgery: Vaginal bleeding should be no heavier than a normal period. Spotting may continue up to 10 days. Mild cramps may continue for a couple of days. You may have a regular period in 2-6 weeks.  Unexpected observations call your doctor if these occur: persistent or heavy bleeding. Severe abdominal cramping or pain. Elevation of temperature greater than 100F.  Start with tylenol for pain. Do not take any ibuprofen until 5pm if needed.   Post Anesthesia Home Care Instructions  Activity: Get plenty of rest for the remainder of the day. A responsible individual must stay with you for 24 hours following the procedure.  For the next 24 hours, DO NOT: -Drive a car -Paediatric nurse -Drink alcoholic beverages -Take any medication unless instructed by your physician -Make any legal decisions or sign important papers.  Meals: Start with liquid foods such as gelatin or soup. Progress to regular foods as tolerated. Avoid greasy, spicy, heavy foods. If nausea and/or vomiting occur, drink only clear liquids until the nausea and/or vomiting subsides. Call your physician if vomiting continues.  Special Instructions/Symptoms: Your throat may feel dry or sore from the anesthesia or  the breathing tube placed in your throat during surgery. If this causes discomfort, gargle with warm salt water. The discomfort should disappear within 24 hours.

## 2021-03-27 NOTE — Anesthesia Preprocedure Evaluation (Signed)
Anesthesia Evaluation  Patient identified by MRN, date of birth, ID band Patient awake    Reviewed: Allergy & Precautions, NPO status , Patient's Chart, lab work & pertinent test results  Airway Mallampati: II  TM Distance: >3 FB Neck ROM: Full    Dental no notable dental hx.    Pulmonary neg pulmonary ROS,    Pulmonary exam normal        Cardiovascular hypertension, Pt. on medications + DVT   Rhythm:Regular Rate:Normal     Neuro/Psych negative neurological ROS  negative psych ROS   GI/Hepatic Neg liver ROS, GERD  Medicated,  Endo/Other  Hypothyroidism   Renal/GU negative Renal ROS  Female GU complaint PMB    Musculoskeletal  (+) Arthritis , Osteoarthritis,    Abdominal Normal abdominal exam  (+)   Peds  Hematology  (+) anemia ,   Anesthesia Other Findings   Reproductive/Obstetrics                             Anesthesia Physical Anesthesia Plan  ASA: 2  Anesthesia Plan: General   Post-op Pain Management:    Induction: Intravenous  PONV Risk Score and Plan: 3 and Ondansetron, Dexamethasone and Treatment may vary due to age or medical condition  Airway Management Planned: LMA and Mask  Additional Equipment: None  Intra-op Plan:   Post-operative Plan: Extubation in OR  Informed Consent: I have reviewed the patients History and Physical, chart, labs and discussed the procedure including the risks, benefits and alternatives for the proposed anesthesia with the patient or authorized representative who has indicated his/her understanding and acceptance.     Dental advisory given  Plan Discussed with: CRNA  Anesthesia Plan Comments:         Anesthesia Quick Evaluation

## 2021-03-27 NOTE — Transfer of Care (Signed)
Immediate Anesthesia Transfer of Care Note  Patient: Kimberly [name]  Procedure(s) Performed: Procedure(s) (LRB): DILATATION & CURETTAGE/HYSTEROSCOPY/Polypectomy WITH MYOSURE (N/A)  Patient Location: PACU  Anesthesia Type: General  Level of Consciousness: awake, sedated, patient cooperative and responds to stimulation  Airway & Oxygen Therapy: Patient Spontanous Breathing and Patient connected to  02 and soft FM   Post-op Assessment: Report given to PACU RN, Post -op Vital signs reviewed and stable and Patient moving all extremities  Post vital signs: Reviewed and stable  Complications: No apparent anesthesia complications

## 2021-03-27 NOTE — Op Note (Signed)
Kimberly [name] 03/27/2021  Procedure: Hysteroscopic polypectomy, D&C with Myosure Reach  Preoperative diagnosis: Postmenopausal uterine bleeding, endometrial polyp  Postop diagnosis: as above.  Anesthesia General via LMA and 10 cc Paracervical block wit 1% Lidocaine with epinephrine Surgeon: Azucena Fallen, MD  IV fluids 900 cc LR Estimated blood loss 5 cc Urine output: NA, voided before surgery Complications none  Condition stable  Disposition PACU  Specimen: Endometrial polyp remnant with endometrial curettings   Procedure  Indication: Postmenopausal bleeding. Office endometrial biopsy noted inactive endometrial tissue and cervical polyp. Office ultrasound noted endometrial stripe of 4.8 mm. She was counseled on risks/ complications including infection, bleeding, damage to internal organs, she understood and agrees, gave informed written consent.  Patient was brought to the operating room with IV running. Time out was carried out. She underwent general anesthesia via LMA without complications. She was given dorsolithotomy position. Parts were prepped and draped in standard fashion. Bimanual exam revealed uterus to be anteverted and normal size. Speculum was placed and cervix was grasped with single-tooth tenaculum. Cervical block with 10 cc 1% Lidocaine with epinephrine. The uterus was sounded to 7 cm. Cervical os was dilated to 19 [place] dilator. Myosure Hysteroscope was introduced in the uterine cavity under vision, using saline for irrigation.  Findings: Small stalk of polyp noted in lower segment of uterus, Fundus normal cavity with normal bilateral tubal ostii. Endometrium was hyperemic but thin.  Hysteroscopic polypectomy  and then visual curettage of endometrium done with Myosure Reach.Specimen sent to path. Hysteroscope was removed.  Fluid deficit 350  cc.  All counts are correct x2. No complications. Patient brought to the recovery room in stable condition.  [name] Follow up in 2 weeks in office. Warning signs of infection and excessive bleeding reviewed.  [name], MD.   Erling Conte ObGyn

## 2021-03-28 ENCOUNTER — Encounter (HOSPITAL_BASED_OUTPATIENT_CLINIC_OR_DEPARTMENT_OTHER): Payer: Self-pay | Admitting: Obstetrics & Gynecology

## 2021-03-28 LAB — SURGICAL PATHOLOGY

## 2021-04-01 DIAGNOSIS — Z23 Encounter for immunization: Secondary | ICD-10-CM | POA: Diagnosis not present

## 2021-04-08 DIAGNOSIS — U071 COVID-19: Secondary | ICD-10-CM | POA: Diagnosis not present

## 2021-04-10 ENCOUNTER — Ambulatory Visit (INDEPENDENT_AMBULATORY_CARE_PROVIDER_SITE_OTHER): Payer: Medicare Other | Admitting: Family Medicine

## 2021-04-10 VITALS — BP 150/78 | HR 90 | Temp 97.6°F | Wt 170.2 lb

## 2021-04-10 DIAGNOSIS — I10 Essential (primary) hypertension: Secondary | ICD-10-CM

## 2021-04-10 DIAGNOSIS — E1165 Type 2 diabetes mellitus with hyperglycemia: Secondary | ICD-10-CM | POA: Diagnosis not present

## 2021-04-10 DIAGNOSIS — R7303 Prediabetes: Secondary | ICD-10-CM | POA: Diagnosis not present

## 2021-04-10 DIAGNOSIS — Z1231 Encounter for screening mammogram for malignant neoplasm of breast: Secondary | ICD-10-CM | POA: Diagnosis not present

## 2021-04-10 DIAGNOSIS — Z09 Encounter for follow-up examination after completed treatment for conditions other than malignant neoplasm: Secondary | ICD-10-CM | POA: Diagnosis not present

## 2021-04-10 LAB — POCT GLYCOSYLATED HEMOGLOBIN (HGB A1C): Hemoglobin A1C: 8 % — AB (ref 4.0–5.6)

## 2021-04-10 MED ORDER — METFORMIN HCL 500 MG PO TABS: 500.0000 mg | ORAL_TABLET | Freq: Two times a day (BID) | ORAL | 3 refills | Status: DC

## 2021-04-10 MED ORDER — AMLODIPINE BESYLATE 2.5 MG PO TABS: 2.5000 mg | ORAL_TABLET | Freq: Every day | ORAL | 3 refills | Status: DC

## 2021-04-10 NOTE — Progress Notes (Signed)
Established Patient Office Visit  Subjective:  Patient ID: Kimberly [name], female    DOB: 26-Mar-1951  Age: 70 y.o. MRN: [id number]  CC:  Chief Complaint  Patient presents with   Follow-up    HPI Kimberly [name] is seen in follow-up to reassess blood pressure.  Refer to last note.  She had presented to her GYN with 9-day history of postmenopausal vaginal bleeding.  She underwent D&C and had removal of benign polyp.  Had some expected spotting afterwards but no bleeding since then.  She is now off medroxyprogesterone.  She was seen here recently with elevated blood pressure and we recommended reassessment.  She does take Atacand 300 mg daily.  Increased malaise recently.  She had some mild weight loss.  Recent labs including thyroid function were normal.  Hemoglobin normal.  Her glucose was 188 and 3 days later during her hospital visit for [facility] this was 139.  Her highest previous A1c 6.7% and 5.9% last spring.  No polyuria or polydipsia.  She had been placed years ago on metformin with A1c 5.9% and she decided to come off of that time.  Has not been doing any recent walking and hopes to start that back soon.  She is getting ready to go back to [place] for 1 month visit leaving next week  Past Medical History:  Diagnosis Date   DDD (degenerative disc disease), lumbar    GERD (gastroesophageal reflux disease)    History of anemia    History of COVID-19    11-11-2020 positive result in care everywhere   History of DVT (deep vein thrombosis) 2003   per pt completed coumadin x 6 mon, never a blood clot prior to 2003 and none since   Hypertension    followed by pcp   (nuclear stress test in epic 05-13-2019, Low risk without ischemia, normal LV and wall motion , ef 68%)   Hypothyroidism    followed by pcp   Lattice degeneration of right retina    OA (osteoarthritis)    Osteopenia    PMB (postmenopausal bleeding)    Pre-diabetes    Vegetarian diet    eats fish and eggs; no meat    Past Surgical  History:  Procedure Laterality Date   CATARACT EXTRACTION W/ INTRAOCULAR LENS IMPLANT Bilateral 07/2020   COLONOSCOPY  2016   DILATATION & CURETTAGE/HYSTEROSCOPY WITH MYOSURE N/A 03/27/2021   Procedure: DILATATION & CURETTAGE/HYSTEROSCOPY/Polypectomy WITH MYOSURE;  Surgeon: Azucena Fallen, MD;  Location: [facility];  Service: Gynecology;  Laterality: N/A;   KNEE ARTHROSCOPY Right 03/16/2003   OOPHORECTOMY Left 1999   benign cyst per pt   TOTAL KNEE ARTHROPLASTY Right 09/14/2014   Procedure: RIGHT TOTAL KNEE ARTHROPLASTY;  Surgeon: Garald Balding, MD;  Location: [place];  Service: Orthopedics;  Laterality: Right;   TOTAL KNEE ARTHROPLASTY Left 11/20/2016   Procedure: LEFT TOTAL KNEE ARTHROPLASTY;  Surgeon: Garald Balding, MD;  Location: [place];  Service: Orthopedics;  Laterality: Left;    Family History  Problem Relation Age of Onset   Diabetes Mother    Heart failure Father    Diabetes Other    Alcohol abuse Other     Social History   Socioeconomic History   Marital status: Married    Spouse name: Not on file   Number of children: 2   Years of education: Not on file   Highest education level: Master's degree (e.g., MA, MS, MEng, MEd, MSW, MBA)  Occupational History  Comment: retired  Tobacco Use   Smoking status: Never   Smokeless tobacco: Never  Vaping Use   Vaping Use: Never used  Substance and Sexual Activity   Alcohol use: No   Drug use: Never   Sexual activity: Not on file  Other Topics Concern   Not on file  Social History Narrative   Married   Never smoker   Alcohol use-no   Regular exercise- yes. Walks regularly   Brother-alcohol   Illicit drug- no   Daily Caffeine use- 4 cups of tea daily   Social Determinants of Health   Financial Resource Strain: Low Risk    Difficulty of Paying Living Expenses: Not hard at all  Food Insecurity: No Food Insecurity   Worried About Charity fundraiser in the Last Year: Never true   Ran Out of  Food in the Last Year: Never true  Transportation Needs: No Transportation Needs   Lack of Transportation (Medical): No   Lack of Transportation (Non-Medical): No  Physical Activity: Sufficiently Active   Days of Exercise per Week: 5 days   Minutes of Exercise per Session: 50 min  Stress: No Stress Concern Present   Feeling of Stress : Not at all  Social Connections: Moderately Integrated   Frequency of Communication with Friends and Family: More than three times a week   Frequency of Social Gatherings with Friends and Family: Three times a week   Attends Religious Services: More than 4 times per year   Active Member of Clubs or Organizations: No   Attends Archivist Meetings: Never   Marital Status: Married  Human resources officer Violence: Not At Risk   Fear of Current or Ex-Partner: No   Emotionally Abused: No   Physically Abused: No   Sexually Abused: No    Outpatient Medications Prior to Visit  Medication Sig Dispense Refill   acetaminophen (TYLENOL) 500 MG tablet Take 1,000 mg by mouth daily as needed for moderate pain.     amoxicillin (AMOXIL) 500 MG capsule TAKE 4 CAPSULES 1 HOUR PRIOR TO DENTAL PROCEDURE (Patient taking differently: as directed. TAKE 4 CAPSULES 1 HOUR PRIOR TO DENTAL PROCEDURE) 12 capsule 1   Calcium Carbonate (CALCIUM 600 PO) Take 600 mg by mouth 2 (two) times a week.     cholecalciferol (VITAMIN D) 1000 units tablet Take 1,000 Units by mouth daily.     fluticasone (FLONASE) 50 MCG/ACT nasal spray Place 1 spray into both nostrils daily. (Patient taking differently: Place 1 spray into both nostrils daily as needed.) 16 g 0   irbesartan (AVAPRO) 300 MG tablet TAKE 1 TABLET BY MOUTH EVERY DAY (Patient taking differently: Take 300 mg by mouth.) 90 tablet 3   levothyroxine (SYNTHROID) 50 MCG tablet TAKE 1 TABLET BY MOUTH EVERY DAY BEFORE BREAKFAST 90 tablet 3   omeprazole (PRILOSEC) 40 MG capsule Take 40 mg by mouth daily as needed.     No  facility-administered medications prior to visit.    Allergies  Allergen Reactions   Enoxaparin Sodium Itching and Swelling    SWELLING REACTION UNSPECIFIED     ROS Review of Systems  Constitutional:  Negative for fatigue and unexpected weight change.  Eyes:  Negative for visual disturbance.  Respiratory:  Negative for cough, chest tightness, shortness of breath and wheezing.   Cardiovascular:  Negative for chest pain, palpitations and leg swelling.  Endocrine: Negative for polydipsia and polyuria.  Neurological:  Negative for dizziness, seizures, syncope, weakness, light-headedness and headaches.  Objective:    Physical Exam Constitutional:      Appearance: She is well-developed.  HENT:     Right Ear: Tympanic membrane normal.     Left Ear: Tympanic membrane normal.  Eyes:     Pupils: Pupils are equal, round, and reactive to light.  Neck:     Thyroid: No thyromegaly.     Vascular: No JVD.  Cardiovascular:     Rate and Rhythm: Normal rate and regular rhythm.     Heart sounds:    No gallop.  Pulmonary:     Effort: Pulmonary effort is normal. No respiratory distress.     Breath sounds: Normal breath sounds. No wheezing or rales.  Musculoskeletal:     Cervical back: Neck supple.  Neurological:     Mental Status: She is alert.    BP 130/62 (BP Location: Left Arm, Patient Position: Sitting, Cuff Size: Normal)   Pulse 90   Temp 97.6 F (36.4 C) (Oral)   Wt 170 lb 3.2 oz (77.2 kg)   LMP 05/14/1998   SpO2 98%   BMI 32.16 kg/m  Wt Readings from Last 3 Encounters:  04/10/21 170 lb 3.2 oz (77.2 kg)  03/27/21 169 lb 14.4 oz (77.1 kg)  03/24/21 169 lb 9.6 oz (76.9 kg)     Health Maintenance Due  Topic Date Due   Zoster Vaccines- Shingrix (1 of 2) Never done   TETANUS/TDAP  02/11/2017   COVID-19 Vaccine (4 - Booster for Pfizer series) 03/16/2020   INFLUENZA VACCINE  12/12/2020    There are no preventive care reminders to display for this patient.  Lab  Results  Component Value Date   TSH 2.38 03/24/2021   Lab Results  Component Value Date   WBC 6.7 03/27/2021   HGB 14.1 03/27/2021   HCT 41.8 03/27/2021   MCV 90.9 03/27/2021   PLT 250 03/27/2021   Lab Results  Component Value Date   NA 133 (L) 03/27/2021   K 4.0 03/27/2021   CO2 24 03/27/2021   GLUCOSE 139 (H) 03/27/2021   BUN 10 03/27/2021   CREATININE 0.62 03/27/2021   BILITOT 0.6 03/24/2021   ALKPHOS 56 03/24/2021   AST 32 03/24/2021   ALT 38 (H) 03/24/2021   PROT 7.6 03/24/2021   ALBUMIN 4.0 03/24/2021   CALCIUM 9.3 03/27/2021   ANIONGAP 8 03/27/2021   GFR 87.32 03/24/2021   Lab Results  Component Value Date   CHOL 176 07/18/2020   Lab Results  Component Value Date   HDL 46.50 07/18/2020   Lab Results  Component Value Date   LDLCALC 111 (H) 07/18/2020   Lab Results  Component Value Date   TRIG 91.0 07/18/2020   Lab Results  Component Value Date   CHOLHDL 4 07/18/2020   Lab Results  Component Value Date   HGBA1C 8.0 (A) 04/10/2021      Assessment & Plan:   #1 hypertension.  Poorly controlled.  Repeat blood pressure both right and left arm seated after rest 150/78.  We discussed nonpharmacologic factors such as watching sodium intake closely and starting back regular walking program.  Start amlodipine 2.5 mg daily in addition to her Atacand.  Monitor blood pressure closely be in touch if not consistently less than 633 systolic over the next couple weeks  #2 type 2 diabetes with A1c today 8.0%.  Discussed diet.  Reduce sugars and starches.  Start back metformin 500 mg twice daily and reassess A1c in 3 months We also discussed getting  her into see nutritionist soon when she gets back from a trip to [place] and she agrees   Meds ordered this encounter  Medications   amLODipine (NORVASC) 2.5 MG tablet    Sig: Take 1 tablet (2.5 mg total) by mouth daily.    Dispense:  90 tablet    Refill:  3   metFORMIN (GLUCOPHAGE) 500 MG tablet    Sig: Take 1  tablet (500 mg total) by mouth 2 (two) times daily with a meal.    Dispense:  180 tablet    Refill:  3    Follow-up: Return in about 3 months (around 07/11/2021).    Carolann Littler, MD

## 2021-04-10 NOTE — Patient Instructions (Addendum)
Monitor blood pressure and be in touch if systolic (top number) not consistently < 140 when you get back.    A1C today is 8.0%.    Go ahead and start the Metformin.

## 2021-04-11 ENCOUNTER — Encounter (INDEPENDENT_AMBULATORY_CARE_PROVIDER_SITE_OTHER): Payer: Medicare Other | Admitting: Ophthalmology

## 2021-04-11 ENCOUNTER — Other Ambulatory Visit: Payer: Self-pay

## 2021-04-11 DIAGNOSIS — H35033 Hypertensive retinopathy, bilateral: Secondary | ICD-10-CM | POA: Diagnosis not present

## 2021-04-11 DIAGNOSIS — H353111 Nonexudative age-related macular degeneration, right eye, early dry stage: Secondary | ICD-10-CM

## 2021-04-11 DIAGNOSIS — H353122 Nonexudative age-related macular degeneration, left eye, intermediate dry stage: Secondary | ICD-10-CM

## 2021-04-11 DIAGNOSIS — H43813 Vitreous degeneration, bilateral: Secondary | ICD-10-CM

## 2021-04-11 DIAGNOSIS — H33302 Unspecified retinal break, left eye: Secondary | ICD-10-CM

## 2021-04-11 DIAGNOSIS — I10 Essential (primary) hypertension: Secondary | ICD-10-CM

## 2021-04-13 DIAGNOSIS — U071 COVID-19: Secondary | ICD-10-CM | POA: Diagnosis not present

## 2021-05-03 ENCOUNTER — Ambulatory Visit: Payer: Medicare Other

## 2021-07-08 ENCOUNTER — Other Ambulatory Visit: Payer: Self-pay | Admitting: Family Medicine

## 2021-07-08 DIAGNOSIS — H6980 Other specified disorders of Eustachian tube, unspecified ear: Secondary | ICD-10-CM

## 2021-07-11 ENCOUNTER — Encounter: Payer: Self-pay | Admitting: Family Medicine

## 2021-07-11 ENCOUNTER — Ambulatory Visit (INDEPENDENT_AMBULATORY_CARE_PROVIDER_SITE_OTHER): Payer: Medicare Other | Admitting: Family Medicine

## 2021-07-11 VITALS — BP 102/72 | HR 82 | Temp 97.9°F | Ht 61.0 in | Wt 168.7 lb

## 2021-07-11 DIAGNOSIS — E039 Hypothyroidism, unspecified: Secondary | ICD-10-CM | POA: Diagnosis not present

## 2021-07-11 DIAGNOSIS — E1165 Type 2 diabetes mellitus with hyperglycemia: Secondary | ICD-10-CM | POA: Diagnosis not present

## 2021-07-11 DIAGNOSIS — R300 Dysuria: Secondary | ICD-10-CM

## 2021-07-11 DIAGNOSIS — I10 Essential (primary) hypertension: Secondary | ICD-10-CM | POA: Diagnosis not present

## 2021-07-11 LAB — URINALYSIS
Bilirubin Urine: NEGATIVE
Hgb urine dipstick: NEGATIVE
Ketones, ur: NEGATIVE
Leukocytes,Ua: NEGATIVE
Nitrite: NEGATIVE
Specific Gravity, Urine: 1.02 (ref 1.000–1.030)
Total Protein, Urine: NEGATIVE
Urine Glucose: NEGATIVE
Urobilinogen, UA: 0.2 (ref 0.0–1.0)
pH: 5.5 (ref 5.0–8.0)

## 2021-07-11 LAB — POCT GLYCOSYLATED HEMOGLOBIN (HGB A1C): Hemoglobin A1C: 7.3 % — AB (ref 4.0–5.6)

## 2021-07-11 NOTE — Progress Notes (Signed)
Established Patient Office Visit  Subjective:  Patient ID: Kimberly [name], female    DOB: [birth year]  Age: 71 y.o. MRN: [id number]  CC:  Chief Complaint  Patient presents with   Follow-up    HPI Kimberly [name] presents for follow-up regarding type 2 diabetes and hypertension.  When she was seen back in November she was not taking metformin.  We prescribed the metformin but she went to [place] in December and was gone for almost a month and did not take her metformin consistently until around January 6.  She has been back on the metformin now for about 6 weeks.  No polyuria or polydipsia.  We recently added low-dose amlodipine 2.5 mg daily for her hypertension.  Her blood pressures have been very well controlled.  No recent dizziness.  She also remains on irbesartan.  She has hypothyroidism.  She is on replacement.  Last TSH was few months ago and normal at 2.38.  She plans to start walking more for exercise soon.  She feels like her diet was challenged when she was in [place] because of a lot of starchy food choices.  She is back on a better diet at this time.  Past Medical History:  Diagnosis Date   DDD (degenerative disc disease), lumbar    GERD (gastroesophageal reflux disease)    History of anemia    History of COVID-19    11-11-2020 positive result in care everywhere   History of DVT (deep vein thrombosis) 2003   per pt completed coumadin x 6 mon, never a blood clot prior to 2003 and none since   Hypertension    followed by pcp   (nuclear stress test in epic 05-13-2019, Low risk without ischemia, normal LV and wall motion , ef 68%)   Hypothyroidism    followed by pcp   Lattice degeneration of right retina    OA (osteoarthritis)    Osteopenia    PMB (postmenopausal bleeding)    Pre-diabetes    Vegetarian diet    eats fish and eggs; no meat    Past Surgical History:  Procedure Laterality Date   CATARACT EXTRACTION W/ INTRAOCULAR LENS IMPLANT Bilateral 07/2020   COLONOSCOPY  2016    DILATATION & CURETTAGE/HYSTEROSCOPY WITH MYOSURE N/A 03/27/2021   Procedure: DILATATION & CURETTAGE/HYSTEROSCOPY/Polypectomy WITH MYOSURE;  Surgeon: Azucena Fallen, MD;  Location: [facility];  Service: Gynecology;  Laterality: N/A;   KNEE ARTHROSCOPY Right 03/16/2003   OOPHORECTOMY Left 1999   benign cyst per pt   TOTAL KNEE ARTHROPLASTY Right 09/14/2014   Procedure: RIGHT TOTAL KNEE ARTHROPLASTY;  Surgeon: Garald Balding, MD;  Location: [place];  Service: Orthopedics;  Laterality: Right;   TOTAL KNEE ARTHROPLASTY Left 11/20/2016   Procedure: LEFT TOTAL KNEE ARTHROPLASTY;  Surgeon: Garald Balding, MD;  Location: [place];  Service: Orthopedics;  Laterality: Left;    Family History  Problem Relation Age of Onset   Diabetes Mother    Heart failure Father    Diabetes Other    Alcohol abuse Other     Social History   Socioeconomic History   Marital status: Married    Spouse name: Not on file   Number of children: 2   Years of education: Not on file   Highest education level: Master's degree (e.g., MA, MS, MEng, MEd, MSW, MBA)  Occupational History    Comment: retired  Tobacco Use   Smoking status: Never   Smokeless tobacco: Never  Vaping Use   Vaping  Use: Never used  Substance and Sexual Activity   Alcohol use: No   Drug use: Never   Sexual activity: Not on file  Other Topics Concern   Not on file  Social History Narrative   Married   Never smoker   Alcohol use-no   Regular exercise- yes. Walks regularly   Brother-alcohol   Illicit drug- no   Daily Caffeine use- 4 cups of tea daily   Social Determinants of Health   Financial Resource Strain: Not on file  Food Insecurity: Not on file  Transportation Needs: Not on file  Physical Activity: Not on file  Stress: Not on file  Social Connections: Not on file  Intimate Partner Violence: Not on file    Outpatient Medications Prior to Visit  Medication Sig Dispense Refill   acetaminophen (TYLENOL)  500 MG tablet Take 1,000 mg by mouth daily as needed for moderate pain.     amLODipine (NORVASC) 2.5 MG tablet Take 1 tablet (2.5 mg total) by mouth daily. 90 tablet 3   amoxicillin (AMOXIL) 500 MG capsule TAKE 4 CAPSULES 1 HOUR PRIOR TO DENTAL PROCEDURE (Patient taking differently: as directed. TAKE 4 CAPSULES 1 HOUR PRIOR TO DENTAL PROCEDURE) 12 capsule 1   Calcium Carbonate (CALCIUM 600 PO) Take 600 mg by mouth 2 (two) times a week.     cholecalciferol (VITAMIN D) 1000 units tablet Take 1,000 Units by mouth daily.     irbesartan (AVAPRO) 300 MG tablet TAKE 1 TABLET BY MOUTH EVERY DAY (Patient taking differently: Take 300 mg by mouth.) 90 tablet 3   levothyroxine (SYNTHROID) 50 MCG tablet TAKE 1 TABLET BY MOUTH EVERY DAY BEFORE BREAKFAST 90 tablet 3   metFORMIN (GLUCOPHAGE) 500 MG tablet Take 1 tablet (500 mg total) by mouth 2 (two) times daily with a meal. 180 tablet 3   omeprazole (PRILOSEC) 40 MG capsule Take 40 mg by mouth daily as needed.     fluticasone (FLONASE) 50 MCG/ACT nasal spray SPRAY 1 SPRAY INTO BOTH NOSTRILS DAILY. 16 mL 11   No facility-administered medications prior to visit.    Allergies  Allergen Reactions   Enoxaparin Sodium Itching and Swelling    SWELLING REACTION UNSPECIFIED     ROS Review of Systems  Constitutional:  Negative for fatigue and unexpected weight change.  Eyes:  Negative for visual disturbance.  Respiratory:  Negative for cough, chest tightness, shortness of breath and wheezing.   Cardiovascular:  Negative for chest pain, palpitations and leg swelling.  Endocrine: Negative for polydipsia and polyuria.  Neurological:  Negative for dizziness, seizures, syncope, weakness, light-headedness and headaches.     Objective:    Physical Exam Constitutional:      Appearance: She is well-developed.  Eyes:     Pupils: Pupils are equal, round, and reactive to light.  Neck:     Thyroid: No thyromegaly.     Vascular: No JVD.  Cardiovascular:     Rate  and Rhythm: Normal rate and regular rhythm.     Heart sounds:    No gallop.  Pulmonary:     Effort: Pulmonary effort is normal. No respiratory distress.     Breath sounds: Normal breath sounds. No wheezing or rales.  Musculoskeletal:     Cervical back: Neck supple.  Neurological:     Mental Status: She is alert.    BP 102/72 (BP Location: Left Arm, Patient Position: Sitting, Cuff Size: Large)    Pulse 82    Temp 97.9 F (36.6 C) (Oral)  Ht 5\' 1"  (1.549 m)    Wt 168 lb 11.2 oz (76.5 kg)    LMP 05/14/1998    SpO2 98%    BMI 31.88 kg/m  Wt Readings from Last 3 Encounters:  07/11/21 168 lb 11.2 oz (76.5 kg)  04/10/21 170 lb 3.2 oz (77.2 kg)  03/27/21 169 lb 14.4 oz (77.1 kg)     Health Maintenance Due  Topic Date Due   FOOT EXAM  Never done   TETANUS/TDAP  02/11/2017   OPHTHALMOLOGY EXAM  07/06/2021    There are no preventive care reminders to display for this patient.  Lab Results  Component Value Date   TSH 2.38 03/24/2021   Lab Results  Component Value Date   WBC 6.7 03/27/2021   HGB 14.1 03/27/2021   HCT 41.8 03/27/2021   MCV 90.9 03/27/2021   PLT 250 03/27/2021   Lab Results  Component Value Date   NA 133 (L) 03/27/2021   K 4.0 03/27/2021   CO2 24 03/27/2021   GLUCOSE 139 (H) 03/27/2021   BUN 10 03/27/2021   CREATININE 0.62 03/27/2021   BILITOT 0.6 03/24/2021   ALKPHOS 56 03/24/2021   AST 32 03/24/2021   ALT 38 (H) 03/24/2021   PROT 7.6 03/24/2021   ALBUMIN 4.0 03/24/2021   CALCIUM 9.3 03/27/2021   ANIONGAP 8 03/27/2021   GFR 87.32 03/24/2021   Lab Results  Component Value Date   CHOL 176 07/18/2020   Lab Results  Component Value Date   HDL 46.50 07/18/2020   Lab Results  Component Value Date   LDLCALC 111 (H) 07/18/2020   Lab Results  Component Value Date   TRIG 91.0 07/18/2020   Lab Results  Component Value Date   CHOLHDL 4 07/18/2020   Lab Results  Component Value Date   HGBA1C 7.3 (A) 07/11/2021      Assessment & Plan:    #1 type 2 diabetes improved with A1c reduction from 8.0 to 7.3%.  She has only been on her metformin consistently for about 6 weeks.  We recommended giving this 3 more months and reassess.  Step up exercise.  If A1c not fully to goal at follow-up in 3 months consider either SGLT2 or possibly GLP-1 medication  #2 hypertension stable and at goal.  Continue low-dose amlodipine and Avapro  #3 hyperlipidemia-history of mild elevated LDL in the past.  Recheck fasting lipids with next labs in 3 months  #4 hypothyroidism.  Patient on replacement with levothyroxine 75 mcg daily and recent TSH at goal.  Continue current dosage.   No orders of the defined types were placed in this encounter.   Follow-up: Return in about 3 months (around 10/08/2021).    Carolann Littler, MD

## 2021-07-11 NOTE — Patient Instructions (Signed)
A1C today improved to 7.3%.  Let's give this 3 more months and if A1C not further improved at that time consider Jardiance or Ozempic

## 2021-07-20 ENCOUNTER — Telehealth: Payer: Self-pay | Admitting: Family Medicine

## 2021-07-20 NOTE — Telephone Encounter (Signed)
Left message for patient to call back and schedule Medicare Annual Wellness Visit (AWV) either virtually or in office. Left  my Kimberly [name] number [phone] ? ? ?Last AWV 04/27/20 ? please schedule at anytime with [facility] Nurse Health Advisor 1 or 2 ? ? ?This should be a 45 minute visit.  ?

## 2021-07-31 DIAGNOSIS — M7661 Achilles tendinitis, right leg: Secondary | ICD-10-CM | POA: Diagnosis not present

## 2021-07-31 DIAGNOSIS — M7731 Calcaneal spur, right foot: Secondary | ICD-10-CM | POA: Diagnosis not present

## 2021-07-31 DIAGNOSIS — M7732 Calcaneal spur, left foot: Secondary | ICD-10-CM | POA: Diagnosis not present

## 2021-08-28 DIAGNOSIS — M7661 Achilles tendinitis, right leg: Secondary | ICD-10-CM | POA: Diagnosis not present

## 2021-08-28 DIAGNOSIS — M7732 Calcaneal spur, left foot: Secondary | ICD-10-CM | POA: Diagnosis not present

## 2021-08-28 DIAGNOSIS — M24571 Contracture, right ankle: Secondary | ICD-10-CM | POA: Diagnosis not present

## 2021-08-28 DIAGNOSIS — M7731 Calcaneal spur, right foot: Secondary | ICD-10-CM | POA: Diagnosis not present

## 2021-08-30 ENCOUNTER — Ambulatory Visit (INDEPENDENT_AMBULATORY_CARE_PROVIDER_SITE_OTHER): Payer: Medicare Other

## 2021-08-30 VITALS — Ht 61.0 in | Wt 158.0 lb

## 2021-08-30 DIAGNOSIS — Z0000 Encounter for general adult medical examination without abnormal findings: Secondary | ICD-10-CM

## 2021-08-30 NOTE — Progress Notes (Signed)
? ?Subjective:  ? Kimberly [name] is a 71 y.o. female who presents for Medicare Annual (Subsequent) preventive examination. ? ?Review of Systems    ?Virtual Visit via Telephone Note ? ?I connected with  Kimberly [name] on 08/30/21 at  3:15 PM EDT by telephone and verified that I am speaking with the correct person using two identifiers. ? ?Location: ?Patient: Home ?Provider: Office ?Persons participating in the virtual visit: patient/Nurse Health Advisor ?  ?I discussed the limitations, risks, security and privacy concerns of performing an evaluation and management service by telephone and the availability of in person appointments. The patient expressed understanding and agreed to proceed. ? ?Interactive audio and video telecommunications were attempted between this nurse and patient, however failed, due to patient having technical difficulties OR patient did not have access to video capability.  We continued and completed visit with audio only. ? ?Some vital signs may be absent or patient reported.  ? ?Kimberly Peaches, LPN  ?Cardiac Risk Factors include: advanced age (>45mn, >>45women);hypertension ? ?   ?Objective:  ?  ?Today's Vitals  ? 08/30/21 1521  ?Weight: 158 lb (71.7 kg)  ?Height: '5\' 1"'$  (1.549 m)  ? ?Body mass index is 29.85 kg/m?. ? ? ?  08/30/2021  ?  3:31 PM 03/27/2021  ?  8:39 AM 04/27/2020  ? 11:13 AM 12/17/2016  ? 11:07 AM 11/20/2016  ?  8:20 PM 11/08/2016  ?  1:00 PM 02/09/2016  ?  8:49 AM  ?Advanced Directives  ?Does Patient Have a Medical Advance Directive? No Yes Yes Yes Yes Yes Yes  ?Type of ACorporate treasurerof [place] will Living will [place] will Living will Living will [place] will  ?Does patient want to make changes to medical advance directive?    No - Patient declined Yes (Inpatient - patient defers changing a medical advance directive at this time) No - Patient declined No - Patient declined  ?Copy of [place]?  No - copy requested  No - copy requested   No - copy requested  ?Would patient like information on creating a medical advance directive? No - Patient declined      No - patient declined information  ? ? ?Current Medications (verified) ?Outpatient Encounter Medications as of 08/30/2021  ?Medication Sig  ? acetaminophen (TYLENOL) 500 MG tablet Take 1,000 mg by mouth daily as needed for moderate pain.  ? amLODipine (NORVASC) 2.5 MG tablet Take 1 tablet (2.5 mg total) by mouth daily.  ? amoxicillin (AMOXIL) 500 MG capsule TAKE 4 CAPSULES 1 HOUR PRIOR TO DENTAL PROCEDURE (Patient taking differently: as directed. TAKE 4 CAPSULES 1 HOUR PRIOR TO DENTAL PROCEDURE)  ? Calcium Carbonate (CALCIUM 600 PO) Take 600 mg by mouth 2 (two) times a week.  ? cholecalciferol (VITAMIN D) 1000 units tablet Take 1,000 Units by mouth daily.  ? irbesartan (AVAPRO) 300 MG tablet TAKE 1 TABLET BY MOUTH EVERY DAY (Patient taking differently: Take 300 mg by mouth.)  ? levothyroxine (SYNTHROID) 50 MCG tablet TAKE 1 TABLET BY MOUTH EVERY DAY BEFORE BREAKFAST  ? metFORMIN (GLUCOPHAGE) 500 MG tablet Take 1 tablet (500 mg total) by mouth 2 (two) times daily with a meal.  ? omeprazole (PRILOSEC) 40 MG capsule Take 40 mg by mouth daily as needed.  ? ?No facility-administered encounter medications on file as of 08/30/2021.  ? ? ?Allergies (verified) ?Enoxaparin sodium  ? ?History: ?Past Medical History:  ?Diagnosis Date  ?  DDD (degenerative disc disease), lumbar   ? GERD (gastroesophageal reflux disease)   ? History of anemia   ? History of COVID-19   ? 11-11-2020 positive result in care everywhere  ? History of DVT (deep vein thrombosis) 2003  ? per pt completed coumadin x 6 mon, never a blood clot prior to 2003 and none since  ? Hypertension   ? followed by pcp   (nuclear stress test in epic 05-13-2019, Low risk without ischemia, normal LV and wall motion , ef 68%)  ? Hypothyroidism   ? followed by pcp  ? Lattice degeneration of  right retina   ? OA (osteoarthritis)   ? Osteopenia   ? PMB (postmenopausal bleeding)   ? Pre-diabetes   ? Vegetarian diet   ? eats fish and eggs; no meat  ? ?Past Surgical History:  ?Procedure Laterality Date  ? CATARACT EXTRACTION W/ INTRAOCULAR LENS IMPLANT Bilateral 07/2020  ? COLONOSCOPY  2016  ? DILATATION & CURETTAGE/HYSTEROSCOPY WITH MYOSURE N/A 03/27/2021  ? Procedure: DILATATION & CURETTAGE/HYSTEROSCOPY/Polypectomy WITH MYOSURE;  Surgeon: Azucena Fallen, MD;  [facility];  Service: Gynecology;  Laterality: N/A;  ? KNEE ARTHROSCOPY Right 03/16/2003  ? OOPHORECTOMY Left 1999  ? benign cyst per pt  ? TOTAL KNEE ARTHROPLASTY Right 09/14/2014  ? Procedure: RIGHT TOTAL KNEE ARTHROPLASTY;  Surgeon: Garald Balding, MD;  Location: [place];  Service: Orthopedics;  Laterality: Right;  ? TOTAL KNEE ARTHROPLASTY Left 11/20/2016  ? Procedure: LEFT TOTAL KNEE ARTHROPLASTY;  Surgeon: Garald Balding, MD;  Location: [place];  Service: Orthopedics;  Laterality: Left;  ? ?Family History  ?Problem Relation Age of Onset  ? Diabetes Mother   ? Heart failure Father   ? Diabetes Other   ? Alcohol abuse Other   ? ?Social History  ? ?Socioeconomic History  ? Marital status: Married  ?  Spouse name: Not on file  ? Number of children: 2  ? Years of education: Not on file  ? Highest education level: Master's degree (e.g., MA, MS, MEng, MEd, MSW, MBA)  ?Occupational History  ?  Comment: retired  ?Tobacco Use  ? Smoking status: Never  ? Smokeless tobacco: Never  ?Vaping Use  ? Vaping Use: Never used  ?Substance and Sexual Activity  ? Alcohol use: No  ? Drug use: Never  ? Sexual activity: Not on file  ?Other Topics Concern  ? Not on file  ?Social History Narrative  ? Married  ? Never smoker  ? Alcohol use-no  ? Regular exercise- yes. Walks regularly  ? Brother-alcohol  ? Illicit drug- no  ? Daily Caffeine use- 4 cups of tea daily  ? ?Social Determinants of Health  ? ?Financial Resource Strain: Low Risk   ?  Difficulty of Paying Living Expenses: Not hard at all  ?Food Insecurity: No Food Insecurity  ? Worried About Charity fundraiser in the Last Year: Never true  ? Ran Out of Food in the Last Year: Never true  ?Transportation Needs: No Transportation Needs  ? Lack of Transportation (Medical): No  ? Lack of Transportation (Non-Medical): No  ?Physical Activity: Sufficiently Active  ? Days of Exercise per Week: 6 days  ? Minutes of Exercise per Session: 30 min  ?Stress: No Stress Concern Present  ? Feeling of Stress : Not at all  ?Social Connections: Socially Integrated  ? Frequency of Communication with Friends and Family: More than three times a week  ? Frequency of Social Gatherings with  Friends and Family: More than three times a week  ? Attends Religious Services: More than 4 times per year  ? Active Member of Clubs or Organizations: Yes  ? Attends Archivist Meetings: More than 4 times per year  ? Marital Status: Married  ? ? ?Tobacco Counseling ?Counseling given: Not Answered ? ? ?Clinical Intake: ? ?Pre-visit preparation completed: NoDiabetic? Pre Diabetic ? ?Activities of Daily Living ? ?  08/30/2021  ?  3:30 PM 03/27/2021  ?  8:46 AM  ?In your present state of health, do you have any difficulty performing the following activities:  ?Hearing? 0 0  ?Vision? 0 0  ?Difficulty concentrating or making decisions? 0 0  ?Walking or climbing stairs? 0 0  ?Dressing or bathing? 0 0  ?Doing errands, shopping? 0   ?Preparing Food and eating ? N   ?Using the Toilet? N   ?In the past six months, have you accidently leaked urine? N   ?Do you have problems with loss of bowel control? N   ?Managing your Medications? N   ?Managing your Finances? N   ?Housekeeping or managing your Housekeeping? N   ? ? ?Patient Care Team: ?Eulas Post, MD as PCP - General (Family Medicine) ?Zonia Kief, MD as Referring Physician (Rehabilitation) ? ?Indicate any recent Medical Services you may have received from other than [facility]  providers in the past year (date may be approximate). ? ?   ?Assessment:  ? This is a routine wellness examination for Kimberly [name]. ? ?Hearing/Vision screen ?Hearing Screening - Comments:: No hearing difficulty ?V

## 2021-08-30 NOTE — Patient Instructions (Addendum)
?Kimberly [name] , ?Thank you for taking time to come for your Medicare Wellness Visit. I appreciate your ongoing commitment to your health goals. Please review the following plan we discussed and let me know if I can assist you in the future.  ? ?These are the goals we discussed: ? Goals   ? ?   Patient Stated (pt-stated)   ?   Reduce weight. ?  ? ?  ?  ?This is a list of the screening recommended for you and due dates:  ?Health Maintenance  ?Topic Date Due  ? Complete foot exam   Never done  ? COVID-19 Vaccine (4 - Booster for Pfizer series) 03/16/2020  ? Eye exam for diabetics  07/06/2021  ? Zoster (Shingles) Vaccine (1 of 2) 10/08/2021*  ? Tetanus Vaccine  08/31/2022*  ? Flu Shot  12/12/2021  ? Hemoglobin A1C  01/08/2022  ? Mammogram  06/30/2022  ? Colon Cancer Screening  07/29/2024  ? Pneumonia Vaccine  Completed  ? DEXA scan (bone density measurement)  Completed  ? Hepatitis C Screening: USPSTF Recommendation to screen - Ages 2-79 yo.  Completed  ? HPV Vaccine  Aged Out  ?*Topic was postponed. The date shown is not the original due date.  ? ?Advanced directives: No Patient deferred ? ?Conditions/risks identified: None ? ?Next appointment: Follow up in one year for your annual wellness visit  ? ? ?Preventive Care 64 Years and Older, Female ?Preventive care refers to lifestyle choices and visits with your health care provider that can promote health and wellness. ?What does preventive care include? ?A yearly physical exam. This is also called an annual well check. ?Dental exams once or twice a year. ?Routine eye exams. Ask your health care provider how often you should have your eyes checked. ?Personal lifestyle choices, including: ?Daily care of your teeth and gums. ?Regular physical activity. ?Eating a healthy diet. ?Avoiding tobacco and drug use. ?Limiting alcohol use. ?Practicing safe sex. ?Taking low-dose aspirin every day. ?Taking vitamin and mineral supplements as recommended by your health care  provider. ?What happens during an annual well check? ?The services and screenings done by your health care provider during your annual well check will depend on your age, overall health, lifestyle risk factors, and family history of disease. ?Counseling  ?Your health care provider may ask you questions about your: ?Alcohol use. ?Tobacco use. ?Drug use. ?Emotional well-being. ?Home and relationship well-being. ?Sexual activity. ?Eating habits. ?History of falls. ?Memory and ability to understand (cognition). ?Work and work Statistician. ?Reproductive health. ?Screening  ?You may have the following tests or measurements: ?Height, weight, and BMI. ?Blood pressure. ?Lipid and cholesterol levels. These may be checked every 5 years, or more frequently if you are over 21 years old. ?Skin check. ?Lung cancer screening. You may have this screening every year starting at age 48 if you have a 30-pack-year history of smoking and currently smoke or have quit within the past 15 years. ?Fecal occult blood test (FOBT) of the stool. You may have this test every year starting at age 45. ?Flexible sigmoidoscopy or colonoscopy. You may have a sigmoidoscopy every 5 years or a colonoscopy every 10 years starting at age 69. ?Hepatitis C blood test. ?Hepatitis B blood test. ?Sexually transmitted disease (STD) testing. ?Diabetes screening. This is done by checking your blood sugar (glucose) after you have not eaten for a while (fasting). You may have this done every 1-3 years. ?Bone density scan. This is done to screen for osteoporosis. You may have  this done starting at age 31. ?Mammogram. This may be done every 1-2 years. Talk to your health care provider about how often you should have regular mammograms. ?Talk with your health care provider about your test results, treatment options, and if necessary, the need for more tests. ?Vaccines  ?Your health care provider may recommend certain vaccines, such as: ?Influenza vaccine. This is  recommended every year. ?Tetanus, diphtheria, and acellular pertussis (Tdap, Td) vaccine. You may need a Td booster every 10 years. ?Zoster vaccine. You may need this after age 46. ?Pneumococcal 13-valent conjugate (PCV13) vaccine. One dose is recommended after age 17. ?Pneumococcal polysaccharide (PPSV23) vaccine. One dose is recommended after age 68. ?Talk to your health care provider about which screenings and vaccines you need and how often you need them. ?This information is not intended to replace advice given to you by your health care provider. Make sure you discuss any questions you have with your health care provider. ?Document Released: 05/27/2015 Document Revised: 01/18/2016 Document Reviewed: 03/01/2015 ?Elsevier Interactive Patient Education ? 2017 [place] [employer]. ? ?Fall Prevention in the Home ?Falls can cause injuries. They can happen to people of all ages. There are many things you can do to make your home safe and to help prevent falls. ?What can I do on the outside of my home? ?Regularly fix the edges of walkways and driveways and fix any cracks. ?Remove anything that might make you trip as you walk through a door, such as a raised step or threshold. ?Trim any bushes or trees on the path to your home. ?Use bright outdoor lighting. ?Clear any walking paths of anything that might make someone trip, such as rocks or tools. ?Regularly check to see if handrails are loose or broken. Make sure that both sides of any steps have handrails. ?Any raised decks and porches should have guardrails on the edges. ?Have any leaves, snow, or ice cleared regularly. ?Use sand or salt on walking paths during winter. ?Clean up any spills in your garage right away. This includes oil or grease spills. ?What can I do in the bathroom? ?Use night lights. ?Install grab bars by the toilet and in the tub and shower. Do not use towel bars as grab bars. ?Use non-skid mats or decals in the tub or shower. ?If you need to sit down in  the shower, use a plastic, non-slip stool. ?Keep the floor dry. Clean up any water that spills on the floor as soon as it happens. ?Remove soap buildup in the tub or shower regularly. ?Attach bath mats securely with double-sided non-slip rug tape. ?Do not have throw rugs and other things on the floor that can make you trip. ?What can I do in the bedroom? ?Use night lights. ?Make sure that you have a light by your bed that is easy to reach. ?Do not use any sheets or blankets that are too big for your bed. They should not hang down onto the floor. ?Have a firm chair that has side arms. You can use this for support while you get dressed. ?Do not have throw rugs and other things on the floor that can make you trip. ?What can I do in the kitchen? ?Clean up any spills right away. ?Avoid walking on wet floors. ?Keep items that you use a lot in easy-to-reach places. ?If you need to reach something above you, use a strong step stool that has a grab bar. ?Keep electrical cords out of the way. ?Do not use floor polish or  wax that makes floors slippery. If you must use wax, use non-skid floor wax. ?Do not have throw rugs and other things on the floor that can make you trip. ?What can I do with my stairs? ?Do not leave any items on the stairs. ?Make sure that there are handrails on both sides of the stairs and use them. Fix handrails that are broken or loose. Make sure that handrails are as long as the stairways. ?Check any carpeting to make sure that it is firmly attached to the stairs. Fix any carpet that is loose or worn. ?Avoid having throw rugs at the top or bottom of the stairs. If you do have throw rugs, attach them to the floor with carpet tape. ?Make sure that you have a light switch at the top of the stairs and the bottom of the stairs. If you do not have them, ask someone to add them for you. ?What else can I do to help prevent falls? ?Wear shoes that: ?Do not have high heels. ?Have rubber bottoms. ?Are comfortable  and fit you well. ?Are closed at the toe. Do not wear sandals. ?If you use a stepladder: ?Make sure that it is fully opened. Do not climb a closed stepladder. ?Make sure that both sides of the stepladder are locked into p

## 2021-09-19 ENCOUNTER — Ambulatory Visit (INDEPENDENT_AMBULATORY_CARE_PROVIDER_SITE_OTHER): Payer: Medicare Other | Admitting: Family Medicine

## 2021-09-19 ENCOUNTER — Encounter: Payer: Self-pay | Admitting: Family Medicine

## 2021-09-19 VITALS — BP 122/68 | HR 75 | Temp 97.9°F | Ht 61.0 in | Wt 163.6 lb

## 2021-09-19 DIAGNOSIS — E039 Hypothyroidism, unspecified: Secondary | ICD-10-CM | POA: Diagnosis not present

## 2021-09-19 DIAGNOSIS — I10 Essential (primary) hypertension: Secondary | ICD-10-CM | POA: Diagnosis not present

## 2021-09-19 DIAGNOSIS — E1165 Type 2 diabetes mellitus with hyperglycemia: Secondary | ICD-10-CM | POA: Diagnosis not present

## 2021-09-19 MED ORDER — IRBESARTAN 300 MG PO TABS: 300.0000 mg | ORAL_TABLET | Freq: Every day | ORAL | 3 refills | Status: DC

## 2021-09-19 NOTE — Patient Instructions (Signed)
Blood pressure is excellent today ? ?Continue to monitor and be in touch if systolic consistently > 630 ? ?If BP spikes up again may take an extra Amlodipine.   ?

## 2021-09-19 NOTE — Progress Notes (Signed)
? ?Established Patient Office Visit ? ?Subjective   ?Patient ID: Kimberly [name], female    DOB: 1951/03/30  Age: 71 y.o. MRN: [id number] ? ?Chief Complaint  ?Patient presents with  ? Hypertension  ? ? ?HPI ? ? ?Here to discuss hypertension.  She currently takes Avapro 300 mg daily and amlodipine 2.5 mg daily.  She states this past Friday evening she had some headache.  She took her blood pressure and had reading 700 systolic.  Similar readings on Saturday.  She discussed with her daughter who is a physician and her daughter suggest that she take an extra amlodipine.  Since then her blood pressures been improved.  No further headaches.  No chest pains.  No dizziness.  No peripheral edema.  Compliant with medications.  No alcohol use.  Denies any recent dietary changes.  Patient relates she had COVID couple weeks ago but feels recovered at this time.  She plans to start back exercising soon. ? ?She has been walking more recently and has done excellent job and has already lost about 5 pounds since early March.  Her last A1c was 7.3%.  She does remain on metformin 500 mg twice daily.  She also takes levothyroxine for hypothyroidism.  Last TSH in November was normal range 2.38. ? ?Past Medical History:  ?Diagnosis Date  ? DDD (degenerative disc disease), lumbar   ? GERD (gastroesophageal reflux disease)   ? History of anemia   ? History of COVID-19   ? 11-11-2020 positive result in care everywhere  ? History of DVT (deep vein thrombosis) 2003  ? per pt completed coumadin x 6 mon, never a blood clot prior to 2003 and none since  ? Hypertension   ? followed by pcp   (nuclear stress test in epic 05-13-2019, Low risk without ischemia, normal LV and wall motion , ef 68%)  ? Hypothyroidism   ? followed by pcp  ? Lattice degeneration of right retina   ? OA (osteoarthritis)   ? Osteopenia   ? PMB (postmenopausal bleeding)   ? Pre-diabetes   ? Vegetarian diet   ? eats fish and eggs; no meat  ? ?Past Surgical History:  ?Procedure  Laterality Date  ? CATARACT EXTRACTION W/ INTRAOCULAR LENS IMPLANT Bilateral 07/2020  ? COLONOSCOPY  2016  ? DILATATION & CURETTAGE/HYSTEROSCOPY WITH MYOSURE N/A 03/27/2021  ? Procedure: DILATATION & CURETTAGE/HYSTEROSCOPY/Polypectomy WITH MYOSURE;  Surgeon: Azucena Fallen, MD;  Location: [facility];  Service: Gynecology;  Laterality: N/A;  ? KNEE ARTHROSCOPY Right 03/16/2003  ? OOPHORECTOMY Left 1999  ? benign cyst per pt  ? TOTAL KNEE ARTHROPLASTY Right 09/14/2014  ? Procedure: RIGHT TOTAL KNEE ARTHROPLASTY;  Surgeon: Garald Balding, MD;  Location: [place];  Service: Orthopedics;  Laterality: Right;  ? TOTAL KNEE ARTHROPLASTY Left 11/20/2016  ? Procedure: LEFT TOTAL KNEE ARTHROPLASTY;  Surgeon: Garald Balding, MD;  Location: [place];  Service: Orthopedics;  Laterality: Left;  ? ? reports that she has never smoked. She has never used smokeless tobacco. She reports that she does not drink alcohol and does not use drugs. ?family history includes Alcohol abuse in an other family member; Diabetes in her mother and another family member; Heart failure in her father. ?Allergies  ?Allergen Reactions  ? Enoxaparin Sodium Itching and Swelling  ?  SWELLING REACTION UNSPECIFIED   ? ? ?Review of Systems  ?Constitutional:  Negative for diaphoresis.  ?Respiratory:  Negative for cough and shortness of breath.   ?Cardiovascular:  Negative for  chest pain and leg swelling.  ?Neurological:  Negative for dizziness.  ? ?  ?Objective:  ?  ? ?BP 122/68 (BP Location: Left Arm, Cuff Size: Normal)   Pulse 75   Temp 97.9 ?F (36.6 ?C) (Oral)   Ht '5\' 1"'$  (1.549 m)   Wt 163 lb 9.6 oz (74.2 kg)   LMP 05/14/1998   SpO2 98%   BMI 30.91 kg/m?  ?BP Readings from Last 3 Encounters:  ?09/19/21 122/68  ?07/11/21 102/72  ?04/10/21 (!) 150/78  ? ?Wt Readings from Last 3 Encounters:  ?09/19/21 163 lb 9.6 oz (74.2 kg)  ?08/30/21 158 lb (71.7 kg)  ?07/11/21 168 lb 11.2 oz (76.5 kg)  ? ?  ? ?Physical Exam ?Vitals reviewed.   ?Constitutional:   ?   Appearance: Normal appearance.  ?Cardiovascular:  ?   Rate and Rhythm: Normal rate and regular rhythm.  ?Pulmonary:  ?   Effort: Pulmonary effort is normal.  ?   Breath sounds: Normal breath sounds. No wheezing or rales.  ?Musculoskeletal:  ?   Right lower leg: No edema.  ?   Left lower leg: No edema.  ?Neurological:  ?   General: No focal deficit present.  ?   Mental Status: She is alert.  ?   Cranial Nerves: No cranial nerve deficit.  ? ? ? ?No results found for any visits on 09/19/21. ? ?Last metabolic panel ?Lab Results  ?Component Value Date  ? GLUCOSE 139 (H) 03/27/2021  ? NA 133 (L) 03/27/2021  ? K 4.0 03/27/2021  ? CL 101 03/27/2021  ? CO2 24 03/27/2021  ? BUN 10 03/27/2021  ? CREATININE 0.62 03/27/2021  ? GFRNONAA >60 03/27/2021  ? CALCIUM 9.3 03/27/2021  ? PROT 7.6 03/24/2021  ? ALBUMIN 4.0 03/24/2021  ? BILITOT 0.6 03/24/2021  ? ALKPHOS 56 03/24/2021  ? AST 32 03/24/2021  ? ALT 38 (H) 03/24/2021  ? ANIONGAP 8 03/27/2021  ? ?Last lipids ?Lab Results  ?Component Value Date  ? CHOL 176 07/18/2020  ? HDL 46.50 07/18/2020  ? LDLCALC 111 (H) 07/18/2020  ? LDLDIRECT 142.2 03/20/2006  ? TRIG 91.0 07/18/2020  ? CHOLHDL 4 07/18/2020  ? ?Last hemoglobin A1c ?Lab Results  ?Component Value Date  ? HGBA1C 7.3 (A) 07/11/2021  ? ?Last thyroid functions ?Lab Results  ?Component Value Date  ? TSH 2.38 03/24/2021  ? ?  ? ?The 10-year ASCVD risk score (Arnett DK, et al., 2019) is: 37% ? ?  ?Assessment & Plan:  ? ?#1 hypertension.  She had recent spike in blood pressure especially with her systolic reading last Friday and Saturday.  No clear provoking factors.  We explained that secondary causes are very rare and with 1 episode only of atypical blood pressure we recommended observation for now.  If this spikes again consider look for secondary causes. ? ?-Continue current dose of Avapro 300 mg daily and also continue amlodipine 2.5 mg daily.  We also discussed the fact that if she has another spike she  can add an extra amlodipine and if necessary up to 10 mg.  She did leave her amlodipine 10 tablets when she was visiting family out of state and we sent a new prescription to pharmacy giving her ability to get this refilled early. ? ?#2 type 2 diabetes.  Last A1c 7.3%.  Hopefully will be improved and further to goal with her recent weight loss activities.  Can discuss GLP-1 or SGLT2 medications at that point if not further controlled.  She  has follow-up in a few weeks and we will recheck then. ? ?#3 hypothyroidism.  Patient on replacement with levothyroxine.  Last TSH in November and normal. ? ?No follow-ups on file.  ? ? ?Carolann Littler, MD ? ?

## 2021-09-20 DIAGNOSIS — H524 Presbyopia: Secondary | ICD-10-CM | POA: Diagnosis not present

## 2021-09-20 DIAGNOSIS — H43813 Vitreous degeneration, bilateral: Secondary | ICD-10-CM | POA: Diagnosis not present

## 2021-09-20 DIAGNOSIS — H31003 Unspecified chorioretinal scars, bilateral: Secondary | ICD-10-CM | POA: Diagnosis not present

## 2021-09-20 DIAGNOSIS — E119 Type 2 diabetes mellitus without complications: Secondary | ICD-10-CM | POA: Diagnosis not present

## 2021-09-20 LAB — HM DIABETES EYE EXAM

## 2021-09-22 ENCOUNTER — Encounter: Payer: Self-pay | Admitting: Family Medicine

## 2021-10-10 ENCOUNTER — Encounter: Payer: Self-pay | Admitting: Family Medicine

## 2021-10-10 ENCOUNTER — Ambulatory Visit (INDEPENDENT_AMBULATORY_CARE_PROVIDER_SITE_OTHER): Payer: Medicare Other | Admitting: Family Medicine

## 2021-10-10 VITALS — BP 120/70 | HR 75 | Temp 97.5°F | Ht 61.0 in | Wt 163.4 lb

## 2021-10-10 DIAGNOSIS — G4700 Insomnia, unspecified: Secondary | ICD-10-CM

## 2021-10-10 DIAGNOSIS — L659 Nonscarring hair loss, unspecified: Secondary | ICD-10-CM | POA: Diagnosis not present

## 2021-10-10 DIAGNOSIS — E785 Hyperlipidemia, unspecified: Secondary | ICD-10-CM | POA: Diagnosis not present

## 2021-10-10 DIAGNOSIS — I10 Essential (primary) hypertension: Secondary | ICD-10-CM | POA: Diagnosis not present

## 2021-10-10 DIAGNOSIS — E1165 Type 2 diabetes mellitus with hyperglycemia: Secondary | ICD-10-CM

## 2021-10-10 DIAGNOSIS — E559 Vitamin D deficiency, unspecified: Secondary | ICD-10-CM | POA: Diagnosis not present

## 2021-10-10 DIAGNOSIS — E8881 Metabolic syndrome: Secondary | ICD-10-CM | POA: Diagnosis not present

## 2021-10-10 LAB — VITAMIN D 25 HYDROXY (VIT D DEFICIENCY, FRACTURES): VITD: 31.09 ng/mL (ref 30.00–100.00)

## 2021-10-10 LAB — BASIC METABOLIC PANEL
BUN: 14 mg/dL (ref 6–23)
CO2: 27 mEq/L (ref 19–32)
Calcium: 9.9 mg/dL (ref 8.4–10.5)
Chloride: 101 mEq/L (ref 96–112)
Creatinine, Ser: 0.55 mg/dL (ref 0.40–1.20)
GFR: 91.22 mL/min (ref >60.00)
Glucose, Bld: 107 mg/dL — ABNORMAL HIGH (ref 70–99)
Potassium: 4.1 mEq/L (ref 3.5–5.1)
Sodium: 136 mEq/L (ref 135–145)

## 2021-10-10 LAB — HEPATIC FUNCTION PANEL
ALT: 29 U/L (ref 0–35)
AST: 23 U/L (ref 0–37)
Albumin: 4.1 g/dL (ref 3.5–5.2)
Alkaline Phosphatase: 55 U/L (ref 39–117)
Bilirubin, Direct: 0.2 mg/dL (ref 0.0–0.3)
Total Bilirubin: 0.7 mg/dL (ref 0.2–1.2)
Total Protein: 7.9 g/dL (ref 6.0–8.3)

## 2021-10-10 LAB — MICROALBUMIN / CREATININE URINE RATIO
Creatinine,U: 28.1 mg/dL
Microalb Creat Ratio: 2.5 mg/g (ref 0.0–30.0)
Microalb, Ur: <0.7 mg/dL (ref 0.0–1.9)

## 2021-10-10 LAB — POCT GLYCOSYLATED HEMOGLOBIN (HGB A1C): Hemoglobin A1C: 6.7 % — AB (ref 4.0–5.6)

## 2021-10-10 LAB — LIPID PANEL
Cholesterol: 176 mg/dL (ref 0–200)
HDL: 50 mg/dL (ref >39.00)
LDL Cholesterol: 103 mg/dL — ABNORMAL HIGH (ref 0–99)
NonHDL: 126.49
Total CHOL/HDL Ratio: 4
Triglycerides: 117 mg/dL (ref 0.0–149.0)
VLDL: 23.4 mg/dL (ref 0.0–40.0)

## 2021-10-10 NOTE — Progress Notes (Signed)
Established Patient Office Visit  Subjective   Patient ID: Kimberly [name], female    DOB: Jun 27, 1950  Age: 71 y.o. MRN: [id number]  Chief Complaint  Patient presents with   Follow-up    HPI   Here for medical follow-up.  Last year her A1c had climbed to 8.0%.  This has steadily been improving 7.3 last visit and down to 6.7 today.  She remains on metformin 500 mg twice daily.  No polyuria or polydipsia.  She is eating better and is not walking for exercise and has lost some weight.  She had recent diabetic eye exam and reportedly had no retinopathy.  Blood pressure also remains well controlled on Avapro and amlodipine.  She had some generalized alopecia.  No family history of such.  She does have hypothyroidism but is replaced and recent TSH at goal.  She does put her hair in a ponytail but has done so for years.  No other traction.  No localized alopecia.  Insomnia.  She had difficulty falling asleep.  Does sometimes drink tea at night but not consistently.  No alcohol use.  Recently tried melatonin 5 mg which worked fairly well.  The 10-year ASCVD risk score (Arnett DK, et al., 2019) is: 35.8%   Values used to calculate the score:     Age: 14 years     Sex: Female     Is Non-Hispanic African American: No     Diabetic: Yes     Tobacco smoker: Yes     Systolic Blood Pressure: 371 mmHg     Is BP treated: Yes     HDL Cholesterol: 50 mg/dL     Total Cholesterol: 176 mg/dL   Past Medical History:  Diagnosis Date   DDD (degenerative disc disease), lumbar    GERD (gastroesophageal reflux disease)    History of anemia    History of COVID-19    11-11-2020 positive result in care everywhere   History of DVT (deep vein thrombosis) 2003   per pt completed coumadin x 6 mon, never a blood clot prior to 2003 and none since   Hypertension    followed by pcp   (nuclear stress test in epic 05-13-2019, Low risk without ischemia, normal LV and wall motion , ef 68%)   Hypothyroidism     followed by pcp   Lattice degeneration of right retina    OA (osteoarthritis)    Osteopenia    PMB (postmenopausal bleeding)    Pre-diabetes    Vegetarian diet    eats fish and eggs; no meat   Past Surgical History:  Procedure Laterality Date   CATARACT EXTRACTION W/ INTRAOCULAR LENS IMPLANT Bilateral 07/2020   COLONOSCOPY  2016   DILATATION & CURETTAGE/HYSTEROSCOPY WITH MYOSURE N/A 03/27/2021   Procedure: DILATATION & CURETTAGE/HYSTEROSCOPY/Polypectomy WITH MYOSURE;  Surgeon: Azucena Fallen, MD;  Location[facility];  Service: Gynecology;  Laterality: N/A;   KNEE ARTHROSCOPY Right 03/16/2003   OOPHORECTOMY Left 1999   benign cyst per pt   TOTAL KNEE ARTHROPLASTY Right 09/14/2014   Procedure: RIGHT TOTAL KNEE ARTHROPLASTY;  Surgeon: Garald Balding, MD;  Location: [place];  Service: Orthopedics;  Laterality: Right;   TOTAL KNEE ARTHROPLASTY Left 11/20/2016   Procedure: LEFT TOTAL KNEE ARTHROPLASTY;  Surgeon: Garald Balding, MD;  Location: [place];  Service: Orthopedics;  Laterality: Left;    reports that she has never smoked. She has never used smokeless tobacco. She reports that she does not drink alcohol and does  not use drugs. family history includes Alcohol abuse in an other family member; Diabetes in her mother and another family member; Heart failure in her father. Allergies  Allergen Reactions   Enoxaparin Sodium Itching and Swelling    SWELLING REACTION UNSPECIFIED     Review of Systems  Respiratory:  Negative for shortness of breath.   Cardiovascular:  Negative for chest pain.  Gastrointestinal:  Negative for abdominal pain.  Genitourinary:  Negative for dysuria.  Psychiatric/Behavioral:  Negative for depression.      Objective:     BP 120/70 (BP Location: Left Arm, Patient Position: Sitting, Cuff Size: Normal)   Pulse 75   Temp (!) 97.5 F (36.4 C) (Oral)   Ht '5\' 1"'$  (1.549 m)   Wt 163 lb 6.4 oz (74.1 kg)   LMP 05/14/1998   SpO2 99%   BMI  30.87 kg/m  BP Readings from Last 3 Encounters:  10/10/21 120/70  09/19/21 122/68  07/11/21 102/72   Wt Readings from Last 3 Encounters:  10/10/21 163 lb 6.4 oz (74.1 kg)  09/19/21 163 lb 9.6 oz (74.2 kg)  08/30/21 158 lb (71.7 kg)      Physical Exam Constitutional:      Appearance: She is well-developed.  Eyes:     Pupils: Pupils are equal, round, and reactive to light.  Neck:     Thyroid: No thyromegaly.     Vascular: No JVD.  Cardiovascular:     Rate and Rhythm: Normal rate and regular rhythm.     Heart sounds:    No gallop.  Pulmonary:     Effort: Pulmonary effort is normal. No respiratory distress.     Breath sounds: Normal breath sounds. No wheezing or rales.  Musculoskeletal:     Cervical back: Neck supple.     Right lower leg: No edema.     Left lower leg: No edema.  Neurological:     Mental Status: She is alert.     Results for orders placed or performed in visit on 10/10/21  POCT glycosylated hemoglobin (Hb A1C)  Result Value Ref Range   Hemoglobin A1C 6.7 (A) 4.0 - 5.6 %   HbA1c POC (<> result, manual entry)     HbA1c, POC (prediabetic range)     HbA1c, POC (controlled diabetic range)      Last CBC Lab Results  Component Value Date   WBC 6.7 03/27/2021   HGB 14.1 03/27/2021   HCT 41.8 03/27/2021   MCV 90.9 03/27/2021   MCH 30.7 03/27/2021   RDW 13.0 03/27/2021   PLT 250 09/32/6712   Last metabolic panel Lab Results  Component Value Date   GLUCOSE 139 (H) 03/27/2021   NA 133 (L) 03/27/2021   K 4.0 03/27/2021   CL 101 03/27/2021   CO2 24 03/27/2021   BUN 10 03/27/2021   CREATININE 0.62 03/27/2021   GFRNONAA >60 03/27/2021   CALCIUM 9.3 03/27/2021   PROT 7.6 03/24/2021   ALBUMIN 4.0 03/24/2021   BILITOT 0.6 03/24/2021   ALKPHOS 56 03/24/2021   AST 32 03/24/2021   ALT 38 (H) 03/24/2021   ANIONGAP 8 03/27/2021   Last lipids Lab Results  Component Value Date   CHOL 176 07/18/2020   HDL 46.50 07/18/2020   LDLCALC 111 (H)  07/18/2020   LDLDIRECT 142.2 03/20/2006   TRIG 91.0 07/18/2020   CHOLHDL 4 07/18/2020   Last hemoglobin A1c Lab Results  Component Value Date   HGBA1C 6.7 (A) 10/10/2021   Last thyroid  functions Lab Results  Component Value Date   TSH 2.38 03/24/2021      The 10-year ASCVD risk score (Arnett DK, et al., 2019) is: 36%    Assessment & Plan:   #1 type 2 diabetes improving with A1c 6.7%.  Continue weight loss efforts and regular exercise.  Reassess in 3 months.  Continue low-dose metformin.  #2 hypertension stable and at goal.  Continue Avapro 300 mg daily and amlodipine 2.5 mg daily.  #3 dyslipidemia.  Check lipid and hepatic panel.  We discussed statin use in diabetics though she is somewhat equivocal whether she would be willing to go on statin.  #4 transient insomnia.  Avoid late the use of caffeine.  Continue melatonin which seems to be working well for her currently.  Handout given on sleep hygiene.  #5 diffuse alopecia.  Recent TSH at goal.  Avoid tractional hair.   Return in about 3 months (around 01/10/2022).    Carolann Littler, MD

## 2021-10-11 MED ORDER — ROSUVASTATIN CALCIUM 10 MG PO TABS: ORAL_TABLET | ORAL | 0 refills | Status: DC

## 2021-10-11 NOTE — Addendum Note (Signed)
Addended by: Nilda Riggs on: 10/11/2021 01:08 PM   Modules accepted: Orders

## 2021-10-11 NOTE — Addendum Note (Signed)
Addended by: Nilda Riggs on: 10/11/2021 05:31 PM   Modules accepted: Orders

## 2021-10-25 ENCOUNTER — Encounter: Payer: Self-pay | Admitting: Family Medicine

## 2021-10-25 ENCOUNTER — Ambulatory Visit (INDEPENDENT_AMBULATORY_CARE_PROVIDER_SITE_OTHER): Payer: Medicare Other | Admitting: Family Medicine

## 2021-10-25 VITALS — BP 120/70 | HR 75 | Temp 97.7°F | Ht 61.0 in | Wt 164.1 lb

## 2021-10-25 DIAGNOSIS — E785 Hyperlipidemia, unspecified: Secondary | ICD-10-CM | POA: Insufficient documentation

## 2021-10-25 DIAGNOSIS — I10 Essential (primary) hypertension: Secondary | ICD-10-CM | POA: Diagnosis not present

## 2021-10-25 DIAGNOSIS — E1165 Type 2 diabetes mellitus with hyperglycemia: Secondary | ICD-10-CM | POA: Diagnosis not present

## 2021-10-25 DIAGNOSIS — S60459A Superficial foreign body of unspecified finger, initial encounter: Secondary | ICD-10-CM | POA: Diagnosis not present

## 2021-10-25 NOTE — Patient Instructions (Signed)
Remember follow up labs for lipids in 6-8 weeks.

## 2021-10-25 NOTE — Progress Notes (Signed)
Established Patient Office Visit  Subjective   Patient ID: Kimberly [name], female    DOB: 03/02/1951  Age: 71 y.o. MRN: [id number]  Chief Complaint  Patient presents with   Foreign Body in Skin    X2 days     HPI   Seen today for the following items  Small splinter left middle finger.  She grabbed onto a wooden part of the deck couple days ago and sustained small splinter.  Neighbor of hers who is a retired Doctor, general practice try to get this out but he was elderly and had poor vision.  Minimal pain.  She has hyperlipidemia.  Recent calculated 10-year risk for CAD 35%.  We initiated Crestor 10 mg daily.  She also has type 2 diabetes.  Just started the Crestor about 5 days ago.  Tolerating well.  She has hypertension which is well controlled on Avapro and amlodipine.  She is walking regularly for exercise and feels well overall  She has type 2 diabetes and recently went on metformin.  Tolerating well with no GI side effects.  Recent A1c 6.7%.  No polydipsia.  Past Medical History:  Diagnosis Date   DDD (degenerative disc disease), lumbar    GERD (gastroesophageal reflux disease)    History of anemia    History of COVID-19    11-11-2020 positive result in care everywhere   History of DVT (deep vein thrombosis) 2003   per pt completed coumadin x 6 mon, never a blood clot prior to 2003 and none since   Hypertension    followed by pcp   (nuclear stress test in epic 05-13-2019, Low risk without ischemia, normal LV and wall motion , ef 68%)   Hypothyroidism    followed by pcp   Lattice degeneration of right retina    OA (osteoarthritis)    Osteopenia    PMB (postmenopausal bleeding)    Pre-diabetes    Vegetarian diet    eats fish and eggs; no meat   Past Surgical History:  Procedure Laterality Date   CATARACT EXTRACTION W/ INTRAOCULAR LENS IMPLANT Bilateral 07/2020   COLONOSCOPY  2016   DILATATION & CURETTAGE/HYSTEROSCOPY WITH MYOSURE N/A 03/27/2021   Procedure: DILATATION &  CURETTAGE/HYSTEROSCOPY/Polypectomy WITH MYOSURE;  Surgeon: Azucena Fallen, MD;  Location: [facility];  Service: Gynecology;  Laterality: N/A;   KNEE ARTHROSCOPY Right 03/16/2003   OOPHORECTOMY Left 1999   benign cyst per pt   TOTAL KNEE ARTHROPLASTY Right 09/14/2014   Procedure: RIGHT TOTAL KNEE ARTHROPLASTY;  Surgeon: Garald Balding, MD;  Location: [place];  Service: Orthopedics;  Laterality: Right;   TOTAL KNEE ARTHROPLASTY Left 11/20/2016   Procedure: LEFT TOTAL KNEE ARTHROPLASTY;  Surgeon: Garald Balding, MD;  Location: [place];  Service: Orthopedics;  Laterality: Left;    reports that she has never smoked. She has never used smokeless tobacco. She reports that she does not drink alcohol and does not use drugs. family history includes Alcohol abuse in an other family member; Diabetes in her mother and another family member; Heart failure in her father. Allergies  Allergen Reactions   Enoxaparin Sodium Itching and Swelling    SWELLING REACTION UNSPECIFIED     Review of Systems  Constitutional:  Negative for malaise/fatigue.  Eyes:  Negative for blurred vision.  Respiratory:  Negative for shortness of breath.   Cardiovascular:  Negative for chest pain.  Neurological:  Negative for dizziness, weakness and headaches.      Objective:     BP  120/70 (BP Location: Left Arm, Patient Position: Sitting, Cuff Size: Normal)   Pulse 75   Temp 97.7 F (36.5 C) (Oral)   Ht '5\' 1"'$  (1.549 m)   Wt 164 lb 1.6 oz (74.4 kg)   LMP 05/14/1998   SpO2 99%   BMI 31.01 kg/m    Physical Exam Cardiovascular:     Rate and Rhythm: Normal rate and regular rhythm.  Pulmonary:     Effort: Pulmonary effort is normal.     Breath sounds: Normal breath sounds. No wheezing or rales.  Skin:    Comments: Small superficial splinter left middle finger volar surface near the PIP joint.  We were able to remove this with some tweezers without performing incision.  Patient tolerated well.  No  signs of secondary infection.  No erythema.  No warmth.  No drainage.  Foreign body appears to be fully removed  Neurological:     Mental Status: She is alert.      No results found for any visits on 10/25/21.    The 10-year ASCVD risk score (Arnett DK, et al., 2019) is: 27%    Assessment & Plan:   #1 splinter foreign body left middle finger removed without difficulty.  No signs of infection.  #2 dyslipidemia.  Recent initiation of Crestor.  Follow-up in 6 to 8 weeks for lipid and hepatic panel  #3 hypertension well-controlled.  Continue current medications and continue regular walking  #4 type 2 diabetes improving with recent A1c 6.7%  No follow-ups on file.    Carolann Littler, MD

## 2021-11-06 DIAGNOSIS — M24571 Contracture, right ankle: Secondary | ICD-10-CM | POA: Diagnosis not present

## 2021-11-06 DIAGNOSIS — M7661 Achilles tendinitis, right leg: Secondary | ICD-10-CM | POA: Diagnosis not present

## 2021-11-28 ENCOUNTER — Encounter: Payer: Self-pay | Admitting: Family Medicine

## 2021-11-28 ENCOUNTER — Ambulatory Visit (INDEPENDENT_AMBULATORY_CARE_PROVIDER_SITE_OTHER): Payer: Medicare Other | Admitting: Family Medicine

## 2021-11-28 VITALS — BP 124/70 | HR 78 | Temp 97.8°F | Ht 61.0 in | Wt 168.4 lb

## 2021-11-28 DIAGNOSIS — M791 Myalgia, unspecified site: Secondary | ICD-10-CM | POA: Diagnosis not present

## 2021-11-28 MED ORDER — VITAMIN D (ERGOCALCIFEROL) 1.25 MG (50000 UNIT) PO CAPS: 50000.0000 [IU] | ORAL_CAPSULE | ORAL | 3 refills | Status: AC

## 2021-11-28 NOTE — Progress Notes (Signed)
Established Patient Office Visit  Subjective   Patient ID: Kimberly [name], female    DOB: Aug 13, 1950  Age: 71 y.o. MRN: [id number]  Chief Complaint  Patient presents with   Muscle Pain    Patient complains of muscle pain in thighs, x12 days     HPI   Seen with predominantly myalgias in her thighs past couple weeks.  She had been taking Crestor and stopped this but has not seen much improvement yet.  No associated weakness.  She does have history of low vitamin D with recent level of 31.  Last thyroid functions were back in November and normal.  Denies any myalgias in her upper back or neck or shoulder region.  No muscle cramps.  Past Medical History:  Diagnosis Date   DDD (degenerative disc disease), lumbar    GERD (gastroesophageal reflux disease)    History of anemia    History of COVID-19    11-11-2020 positive result in care everywhere   History of DVT (deep vein thrombosis) 2003   per pt completed coumadin x 6 mon, never a blood clot prior to 2003 and none since   Hypertension    followed by pcp   (nuclear stress test in epic 05-13-2019, Low risk without ischemia, normal LV and wall motion , ef 68%)   Hypothyroidism    followed by pcp   Lattice degeneration of right retina    OA (osteoarthritis)    Osteopenia    PMB (postmenopausal bleeding)    Pre-diabetes    Vegetarian diet    eats fish and eggs; no meat   Past Surgical History:  Procedure Laterality Date   CATARACT EXTRACTION W/ INTRAOCULAR LENS IMPLANT Bilateral 07/2020   COLONOSCOPY  2016   DILATATION & CURETTAGE/HYSTEROSCOPY WITH MYOSURE N/A 03/27/2021   Procedure: DILATATION & CURETTAGE/HYSTEROSCOPY/Polypectomy WITH MYOSURE;  Surgeon: Azucena Fallen, MD;  Location: [facility];  Service: Gynecology;  Laterality: N/A;   KNEE ARTHROSCOPY Right 03/16/2003   OOPHORECTOMY Left 1999   benign cyst per pt   TOTAL KNEE ARTHROPLASTY Right 09/14/2014   Procedure: RIGHT TOTAL KNEE ARTHROPLASTY;  Surgeon:  Garald Balding, MD;  Location: [place];  Service: Orthopedics;  Laterality: Right;   TOTAL KNEE ARTHROPLASTY Left 11/20/2016   Procedure: LEFT TOTAL KNEE ARTHROPLASTY;  Surgeon: Garald Balding, MD;  Location: [place];  Service: Orthopedics;  Laterality: Left;    reports that she has never smoked. She has never used smokeless tobacco. She reports that she does not drink alcohol and does not use drugs. family history includes Alcohol abuse in an other family member; Diabetes in her mother and another family member; Heart failure in her father. Allergies  Allergen Reactions   Enoxaparin Sodium Itching and Swelling    SWELLING REACTION UNSPECIFIED     Review of Systems  Constitutional:  Negative for chills and fever.  Musculoskeletal:  Positive for myalgias. Negative for falls and neck pain.  Neurological:  Negative for weakness.      Objective:     BP 124/70 (BP Location: Left Arm, Patient Position: Sitting, Cuff Size: Normal)   Pulse 78   Temp 97.8 F (36.6 C) (Oral)   Ht '5\' 1"'$  (1.549 m)   Wt 168 lb 6.4 oz (76.4 kg)   LMP 05/14/1998   SpO2 98%   BMI 31.82 kg/m    Physical Exam Vitals reviewed.  Constitutional:      Appearance: Normal appearance.  Cardiovascular:     Rate and Rhythm:  Normal rate and regular rhythm.  Pulmonary:     Effort: Pulmonary effort is normal.     Breath sounds: Normal breath sounds.  Neurological:     Mental Status: She is alert.     Comments: Full strength lower extremities.      No results found for any visits on 11/28/21.    The 10-year ASCVD risk score (Arnett DK, et al., 2019) is: 28.6%    Assessment & Plan:   2-week history of myalgia mostly lower extremities.  No associated weakness.  Possibly statin related.  She is currently off Crestor.  We did suggest consider trial of over-the-counter coenzyme Q10 100 to 200 mg once daily  She has follow-up next month.  If symptoms not improved at that time recheck TSH.  She had recent  vitamin D level of 31 and doubt this is related  No follow-ups on file.    Carolann Littler, MD

## 2021-11-28 NOTE — Patient Instructions (Signed)
Consider trial of CoEnzyme Q- 10 100 to 200 mg daily.   Leave off the Statin.    Let me know if muscle aches no better in 2-3 weeks.

## 2021-12-04 ENCOUNTER — Other Ambulatory Visit: Payer: Medicare Other

## 2021-12-12 ENCOUNTER — Ambulatory Visit (INDEPENDENT_AMBULATORY_CARE_PROVIDER_SITE_OTHER): Payer: Medicare Other

## 2021-12-12 ENCOUNTER — Ambulatory Visit (INDEPENDENT_AMBULATORY_CARE_PROVIDER_SITE_OTHER): Payer: Medicare Other | Admitting: Orthopaedic Surgery

## 2021-12-12 ENCOUNTER — Encounter: Payer: Self-pay | Admitting: Orthopaedic Surgery

## 2021-12-12 DIAGNOSIS — M545 Low back pain, unspecified: Secondary | ICD-10-CM | POA: Diagnosis not present

## 2021-12-12 DIAGNOSIS — G8929 Other chronic pain: Secondary | ICD-10-CM | POA: Diagnosis not present

## 2021-12-12 DIAGNOSIS — M5442 Lumbago with sciatica, left side: Secondary | ICD-10-CM | POA: Diagnosis not present

## 2021-12-12 DIAGNOSIS — M5416 Radiculopathy, lumbar region: Secondary | ICD-10-CM

## 2021-12-12 NOTE — Progress Notes (Signed)
Office Visit Note   Patient: Kimberly [name]           Date of Birth: November 29, 1950           MRN: [id number] Visit Date: 12/12/2021              Requested by: Eulas Post, MD [place],  [place] [address] PCP: Eulas Post, MD   Assessment & Plan: Visit Diagnoses:  1. Acute left-sided low back pain with left-sided sciatica   2. Left lumbar radiculitis   3. Chronic bilateral low back pain without sciatica     Plan: Mrs. Bohlman has had another episode of low back pain initially associated with left lower extremity radiculopathy.  That has resolved to the point where she is just having trouble with her back pain has been present for about 3 to 4 weeks with no injury.  Sometimes the pain will radiate to her left hip.  She has had bilateral total knee replacements and is doing very well from that standpoint.  She has had several MRI scans of the lumbar spine in the past.  The last was performed in 2021 through the [place] [facility].  She had multilevel mild to moderate degenerative changes and moderate spinal stenosis at L2-3 secondary to disc bulge and facet arthropathy.  Good reflexes and neurologically intact.  No problem with her knee replacements.  Some percussible tenderness of the lumbar spine.  I would like to try a course of physical therapy.  She can continue with the over-the-counter Tylenol.  I think that would be of some help.  Always could consider repeating the MRI scan.  She could be a candidate for either an epidural steroid injection or facet injections but we will monitor her response with the therapy  Follow-Up Instructions: Return if symptoms worsen or fail to improve.   Orders:  Orders Placed This Encounter  Procedures   XR Lumbar Spine 2-3 Views   Ambulatory referral to Physical Therapy   No orders of the defined types were placed in this encounter.     Procedures: No procedures performed   Clinical Data: No additional  findings.   Subjective: Chief Complaint  Patient presents with   Left Leg - Pain  3 to 4-week history of low back pain initially with left lower extremity radiculopathy but that seems to have resolved.  Pain is now mostly in her back with some occasional pain to the left buttock.  Has had a history of recurrent back pain and in fact has had MRI scans in 2017 and also 05/2019 which we will reference.  No problems referable to her knee replacements.  No pain in the right lower extremity  HPI  Review of Systems   Objective: Vital Signs: LMP 05/14/1998   Physical Exam Constitutional:      Appearance: She is well-developed.  Eyes:     Pupils: Pupils are equal, round, and reactive to light.  Pulmonary:     Effort: Pulmonary effort is normal.  Skin:    General: Skin is warm and dry.  Neurological:     Mental Status: She is alert and oriented to person, place, and time.  Psychiatric:        Behavior: Behavior normal.     Ortho Exam awake alert and oriented x3.  Comfortable sitting leg raise negative.  Reflexes symmetrical.  Motor exam intact.  Has bilateral total knee replacements and with out problem.  The knees were not hot  red or warm with excellent motion and no instability.  Minimal percussible tenderness of the lumbar spine.  No pain over either SI joint.  No pain about either hip or with internal/external rotation  Specialty Comments:  No specialty comments available.  Imaging: XR Lumbar Spine 2-3 Views  Result Date: 12/12/2021 Films of the lumbar spine obtained in 2 projections.  No listhesis or scoliosis.  There are facet joint changes at L4-5 and L5-S1 and some anterior osteophytes.  Some very minimal narrowing of the upper lumbar disc spaces.  No evidence of acute change    PMFS History: Patient Active Problem List   Diagnosis Date Noted   Low back pain 12/12/2021   Dyslipidemia 10/25/2021   Type 2 diabetes mellitus with hyperglycemia ([place]) 04/10/2021   Abnormal  weight gain 07/04/2020   Epigastric pain 07/04/2020   Pruritus ani 07/04/2020   S/P total knee replacement using cement, left 11/20/2016   Osteopenia 02/28/2015   Left lumbar radiculitis 02/28/2015   Acute blood loss anemia 09/20/2014   Primary osteoarthritis of right knee 09/14/2014   Osteoarthritis of left knee 09/14/2014   Vitamin B 12 deficiency 07/29/2014   Obesity (BMI 30-39.9) 12/08/2013   Osteoarthritis, knee 08/67/6195   Metabolic syndrome 09/32/6712   DVT, HX OF 02/10/2010   Vitamin D deficiency 08/25/2007   Disorder of bone and cartilage 04/08/2007   Hypothyroidism 11/18/2006   ANEMIA-NOS 11/18/2006   Essential hypertension 11/18/2006   GERD 11/18/2006   Past Medical History:  Diagnosis Date   DDD (degenerative disc disease), lumbar    GERD (gastroesophageal reflux disease)    History of anemia    History of COVID-19    11-11-2020 positive result in care everywhere   History of DVT (deep vein thrombosis) 2003   per pt completed coumadin x 6 mon, never a blood clot prior to 2003 and none since   Hypertension    followed by pcp   (nuclear stress test in epic 05-13-2019, Low risk without ischemia, normal LV and wall motion , ef 68%)   Hypothyroidism    followed by pcp   Lattice degeneration of right retina    OA (osteoarthritis)    Osteopenia    PMB (postmenopausal bleeding)    Pre-diabetes    Vegetarian diet    eats fish and eggs; no meat    Family History  Problem Relation Age of Onset   Diabetes Mother    Heart failure Father    Diabetes Other    Alcohol abuse Other     Past Surgical History:  Procedure Laterality Date   CATARACT EXTRACTION W/ INTRAOCULAR LENS IMPLANT Bilateral 07/2020   COLONOSCOPY  2016   DILATATION & CURETTAGE/HYSTEROSCOPY WITH MYOSURE N/A 03/27/2021   Procedure: DILATATION & CURETTAGE/HYSTEROSCOPY/Polypectomy WITH MYOSURE;  Surgeon: Azucena Fallen, MD;  Location: [facility];  Service: Gynecology;  Laterality: N/A;    KNEE ARTHROSCOPY Right 03/16/2003   OOPHORECTOMY Left 1999   benign cyst per pt   TOTAL KNEE ARTHROPLASTY Right 09/14/2014   Procedure: RIGHT TOTAL KNEE ARTHROPLASTY;  Surgeon: Garald Balding, MD;  Location: [place];  Service: Orthopedics;  Laterality: Right;   TOTAL KNEE ARTHROPLASTY Left 11/20/2016   Procedure: LEFT TOTAL KNEE ARTHROPLASTY;  Surgeon: Garald Balding, MD;  Location: [place];  Service: Orthopedics;  Laterality: Left;   Social History   Occupational History    Comment: retired  Tobacco Use   Smoking status: Never   Smokeless tobacco: Never  Vaping Use  Vaping Use: Never used  Substance and Sexual Activity   Alcohol use: No   Drug use: Never   Sexual activity: Not on file     Garald Balding, MD   Note - This record has been created using [employer].  Chart creation errors have been sought, but may not always  have been located. Such creation errors do not reflect on  the standard of medical care.

## 2021-12-20 ENCOUNTER — Encounter (HOSPITAL_BASED_OUTPATIENT_CLINIC_OR_DEPARTMENT_OTHER): Payer: Self-pay | Admitting: Emergency Medicine

## 2021-12-20 ENCOUNTER — Other Ambulatory Visit: Payer: Self-pay

## 2021-12-20 ENCOUNTER — Emergency Department (HOSPITAL_BASED_OUTPATIENT_CLINIC_OR_DEPARTMENT_OTHER): Payer: Medicare Other

## 2021-12-20 ENCOUNTER — Emergency Department (HOSPITAL_BASED_OUTPATIENT_CLINIC_OR_DEPARTMENT_OTHER): Payer: Medicare Other | Admitting: Radiology

## 2021-12-20 ENCOUNTER — Emergency Department (HOSPITAL_BASED_OUTPATIENT_CLINIC_OR_DEPARTMENT_OTHER)
Admission: EM | Admit: 2021-12-20 | Discharge: 2021-12-20 | Disposition: A | Discharge status: Home / Self Care | Payer: Medicare Other | Attending: Emergency Medicine | Admitting: Emergency Medicine

## 2021-12-20 DIAGNOSIS — W108XXA Fall (on) (from) other stairs and steps, initial encounter: Secondary | ICD-10-CM | POA: Diagnosis not present

## 2021-12-20 DIAGNOSIS — S0511XA Contusion of eyeball and orbital tissues, right eye, initial encounter: Secondary | ICD-10-CM | POA: Diagnosis not present

## 2021-12-20 DIAGNOSIS — M47812 Spondylosis without myelopathy or radiculopathy, cervical region: Secondary | ICD-10-CM | POA: Diagnosis not present

## 2021-12-20 DIAGNOSIS — H538 Other visual disturbances: Secondary | ICD-10-CM | POA: Diagnosis not present

## 2021-12-20 DIAGNOSIS — Z7984 Long term (current) use of oral hypoglycemic drugs: Secondary | ICD-10-CM | POA: Diagnosis not present

## 2021-12-20 DIAGNOSIS — Z79899 Other long term (current) drug therapy: Secondary | ICD-10-CM | POA: Diagnosis not present

## 2021-12-20 DIAGNOSIS — H05231 Hemorrhage of right orbit: Secondary | ICD-10-CM

## 2021-12-20 DIAGNOSIS — S0011XA Contusion of right eyelid and periocular area, initial encounter: Secondary | ICD-10-CM | POA: Diagnosis not present

## 2021-12-20 DIAGNOSIS — M25551 Pain in right hip: Secondary | ICD-10-CM | POA: Diagnosis not present

## 2021-12-20 DIAGNOSIS — M25562 Pain in left knee: Secondary | ICD-10-CM | POA: Diagnosis not present

## 2021-12-20 DIAGNOSIS — M25561 Pain in right knee: Secondary | ICD-10-CM | POA: Diagnosis not present

## 2021-12-20 DIAGNOSIS — M79641 Pain in right hand: Secondary | ICD-10-CM | POA: Diagnosis not present

## 2021-12-20 DIAGNOSIS — M4802 Spinal stenosis, cervical region: Secondary | ICD-10-CM | POA: Diagnosis not present

## 2021-12-20 DIAGNOSIS — S0993XA Unspecified injury of face, initial encounter: Secondary | ICD-10-CM | POA: Diagnosis present

## 2021-12-20 MED ORDER — TETRACAINE HCL 0.5 % OP SOLN
2.0000 [drp] | Freq: Once | OPHTHALMIC | Status: AC
  Administered 2021-12-20: 2 [drp] via OPHTHALMIC
  Filled 2021-12-20: qty 4

## 2021-12-20 MED ORDER — FLUORESCEIN SODIUM 1 MG OP STRP
1.0000 | ORAL_STRIP | Freq: Once | OPHTHALMIC | Status: AC
  Administered 2021-12-20: 1 via OPHTHALMIC
  Filled 2021-12-20: qty 1

## 2021-12-20 NOTE — Discharge Instructions (Signed)
Today you were seen in the emergency department for your fall.    In the emergency department you had a CT scan of your face, head, and spine as well as multiple x-rays which did not show any new fractures.    At home, please use ice on your eye as well as Tylenol for pain.    Follow-up with your primary doctor in 2-3 days regarding your visit.  Follow-up with ophthalmology in the next week if your eye swelling is not improving.  Return immediately to the emergency department if you experience any of the following: Severe eye pain, pain when you move your eye, vision changes in your eye, or any other concerning symptoms.    Thank you for visiting our Emergency Department. It was a pleasure taking care of you today.

## 2021-12-20 NOTE — ED Triage Notes (Signed)
Fall. Missed last step going into garage. Hit fac/head just above right eye on cement floor, Swelling, eye is almost closed due to swelling Denies LOC. Pain in bilateral knees and right hip.and right hand Happened about 4:30pm

## 2021-12-21 DIAGNOSIS — S0990XA Unspecified injury of head, initial encounter: Secondary | ICD-10-CM | POA: Diagnosis not present

## 2021-12-21 NOTE — ED Provider Notes (Signed)
[facility] EMERGENC[facility] DEPT Provider Note   CSN: [id number] Arrival date & time: 12/20/21  1701     History  Chief Complaint  Patient presents with   Fall    Kimberly [name] is a 71 y.o. female.  71 year old female who presents to the emergency department after a fall.  Reports that she was going down steps and missed the last step falling forward onto her face.  States that she struck the right side of her head.  Denies any LOC is having headache.  Denies any significant neck pain at this time.  Says that she had a difficult time getting up but did not have prolonged time on the ground.  Says that she also has right hand pain and knee pain.  Not on blood thinners.  Denies any weakness or numbness of her arms or legs.  Says that she thought that initially she might have had some vision changes but says that she is able to clearly see clearly out of both eyes at this time.  Does not hurt to move her eyes.   Fall       Home Medications Prior to Admission medications   Medication Sig Start Date End Date Taking? Authorizing Provider  acetaminophen (TYLENOL) 500 MG tablet Take 1,000 mg by mouth daily as needed for moderate pain.    [provider]  amLODipine (NORVASC) 2.5 MG tablet Take 1 tablet (2.5 mg total) by mouth daily. 04/10/21   Burchette, Alinda Sierras, MD  amoxicillin (AMOXIL) 500 MG capsule TAKE 4 CAPSULES 1 HOUR PRIOR TO DENTAL PROCEDURE Patient taking differently: as directed. TAKE 4 CAPSULES 1 HOUR PRIOR TO DENTAL PROCEDURE 02/12/20   Burchette, Alinda Sierras, MD  Calcium Carbonate (CALCIUM 600 PO) Take 600 mg by mouth 2 (two) times a week.    [provider]  cholecalciferol (VITAMIN D) 1000 units tablet Take 1,000 Units by mouth daily.    [provider]  irbesartan (AVAPRO) 300 MG tablet Take 1 tablet (300 mg total) by mouth daily. 09/19/21   Burchette, Alinda Sierras, MD  levothyroxine (SYNTHROID) 50 MCG tablet TAKE 1 TABLET BY MOUTH EVERY DAY BEFORE  BREAKFAST 03/27/21   Burchette, Alinda Sierras, MD  metFORMIN (GLUCOPHAGE) 500 MG tablet Take 1 tablet (500 mg total) by mouth 2 (two) times daily with a meal. 04/10/21   Burchette, Alinda Sierras, MD  omeprazole (PRILOSEC) 40 MG capsule Take 40 mg by mouth daily as needed. 07/28/19   [provider]  rosuvastatin (CRESTOR) 10 MG tablet Take 1 tablet once daily 10/11/21   Burchette, Alinda Sierras, MD  Vitamin D, Ergocalciferol, (DRISDOL) 1.25 MG (50000 UNIT) CAPS capsule Take 1 capsule (50,000 Units total) by mouth every 7 (seven) days. 11/28/21   Burchette, Alinda Sierras, MD      Allergies    Enoxaparin sodium    Review of Systems   Review of Systems  Physical Exam Updated Vital Signs BP (!) 154/72 (BP Location: Right Arm)   Pulse (!) 58   Temp 97.8 F (36.6 C) (Oral)   Resp 18   LMP 05/14/1998   SpO2 100%  Physical Exam Vitals and nursing note reviewed.  Constitutional:      General: She is not in acute distress.    Appearance: She is well-developed.  HENT:     Head: Normocephalic.     Comments: Large periorbital hematoma around the right eye    Right Ear: External ear normal.     Left Ear: External ear normal.  Nose: Nose normal.  Eyes:     Extraocular Movements: Extraocular movements intact.     Conjunctiva/sclera: Conjunctivae normal.     Pupils: Pupils are equal, round, and reactive to light.     Comments: Pupils 4 mm and reactive bilaterally.  No RAPD noted in either eye. Difficulty opening right eye due to the amount of swelling but pressure with Tono-Pen was 20 mmHg on the right and 15 mmHg on the left.  Fluorescein stain without evidence of a corneal abrasion.  Neck:     Comments: No cervical midline ttp.  Cardiovascular:     Rate and Rhythm: Normal rate and regular rhythm.  Pulmonary:     Effort: Pulmonary effort is normal. No respiratory distress.  Abdominal:     General: Abdomen is flat. There is no distension.     Palpations: Abdomen is soft. There is no mass.      Tenderness: There is no abdominal tenderness. There is no guarding.  Musculoskeletal:        General: No swelling.     Cervical back: Normal range of motion and neck supple.     Right lower leg: No edema.     Left lower leg: No edema.     Comments: Able to fully range both knees.  No knee effusions noted.  No tenderness to palpation of either knee including the patella, lateral and medial aspects.  Skin:    General: Skin is warm and dry.     Capillary Refill: Capillary refill takes less than 2 seconds.  Neurological:     Mental Status: She is alert and oriented to person, place, and time. Mental status is at baseline.  Psychiatric:        Mood and Affect: Mood normal.     ED Results / Procedures / Treatments   Labs (all labs ordered are listed, but only abnormal results are displayed) Labs Reviewed - No data to display  EKG None  Radiology DG Hand Complete Right  Result Date: 12/20/2021 CLINICAL DATA:  Right hand pain following a fall EXAM: RIGHT HAND - COMPLETE 3+ VIEW COMPARISON:  12/20/2021 FINDINGS: Mild 3rd and 4th MCP joint degenerative changes. No fracture or dislocation. IMPRESSION: No fracture. Electronically Signed   By: Claudie Revering M.D.   On: 12/20/2021 19:16   DG Knee Complete 4 Views Right  Result Date: 12/20/2021 CLINICAL DATA:  Right knee pain following a fall. EXAM: RIGHT KNEE - COMPLETE 4+ VIEW COMPARISON:  Frontal view of both knees dated 12/15/2020. FINDINGS: Stable total knee prosthesis. Small effusion. Multiple small posterior and anterior corticated loose bodies. No acute fracture or dislocation seen. IMPRESSION: Small effusion with no acute fracture seen. Electronically Signed   By: Claudie Revering M.D.   On: 12/20/2021 19:15   DG Knee Complete 4 Views Left  Result Date: 12/20/2021 CLINICAL DATA:  Left knee pain following a fall. EXAM: LEFT KNEE - COMPLETE 4+ VIEW COMPARISON:  12/15/2020 FINDINGS: Stable left total knee prosthesis. No fracture, dislocation or  effusion seen. Stable small oval, corticated ossific loose body posteriorly. There are 4 interval more linear ossific densities in that region, potentially representing avulsion fracture fragments, age indeterminate. IMPRESSION: 1. Interval 4 small, possible linear avulsion fracture fragments off the posterior aspect of the distal femur. No fracture lines are visualized. These are new since 12/15/2020 and otherwise age indeterminate. 2. Stable small posterior corticated loose body. Electronically Signed   By: Claudie Revering M.D.   On: 12/20/2021 19:14  DG Hip Unilat  With Pelvis 2-3 Views Right  Result Date: 12/20/2021 CLINICAL DATA:  fall pain EXAM: DG HIP (WITH OR WITHOUT PELVIS) 2-3V RIGHT COMPARISON:  X-ray pelvis 08/25/2015 FINDINGS: There is no evidence of hip fracture or dislocation of the right hip. Frontal view of left hip unremarkable. No acute displaced fracture or diastasis of the bones of the pelvis. There is no evidence of arthropathy or other focal bone abnormality. IMPRESSION: Negative. Electronically Signed   By: Iven Finn M.D.   On: 12/20/2021 19:12   CT Head Wo Contrast  Result Date: 12/20/2021 CLINICAL DATA:  Fall EXAM: CT HEAD WITHOUT CONTRAST CT MAXILLOFACIAL WITHOUT CONTRAST CT CERVICAL SPINE WITHOUT CONTRAST TECHNIQUE: Multidetector CT imaging of the head, cervical spine, and maxillofacial structures were performed using the standard protocol without intravenous contrast. Multiplanar CT image reconstructions of the cervical spine and maxillofacial structures were also generated. RADIATION DOSE REDUCTION: This exam was performed according to the departmental dose-optimization program which includes automated exposure control, adjustment of the mA and/or kV according to patient size and/or use of iterative reconstruction technique. COMPARISON:  04/07/2019 CT head; no prior CT of the cervical spine or face FINDINGS: CT HEAD FINDINGS Brain: No evidence of acute infarct, hemorrhage,  mass, mass effect, or midline shift. No hydrocephalus or extra-axial fluid collection. Vascular: No hyperdense vessel. Skull: Normal. Negative for fracture or focal lesion. CT MAXILLOFACIAL FINDINGS Osseous: No fracture or mandibular dislocation. Periapical lucency about the roots of the bilateral mandibular first molars. No other destructive process. Orbits: Negative. No traumatic or inflammatory finding. Sinuses: Clear. Soft tissues: Large right periorbital hematoma, which extends into the right premalar and right frontal scalp soft tissues. CT CERVICAL SPINE FINDINGS Alignment: Reversal of the normal cervical lordosis. No traumatic listhesis. Skull base and vertebrae: No acute fracture or suspicious osseous lesion. Soft tissues and spinal canal: No prevertebral fluid or swelling. No visible canal hematoma. Disc levels: Multilevel degenerative changes, most prominent at C4-C5, C5-C6, and C7-T1, where there is mild spinal canal stenosis secondary to disc osteophyte complexes. Multilevel facet and uncovertebral hypertrophy, which causes up to severe neural foraminal narrowing, noted on the right C4-C5 and bilaterally at C5-C6. Upper chest: No focal pulmonary opacity or pleural effusion. Other: None. IMPRESSION: 1.  No acute intracranial process. 2.  No acute fracture or traumatic listhesis in the cervical spine. 3.  No evidence of facial bone fracture. 4. Large right periorbital hematoma, which extends into the right frontal scalp and right premalar soft tissues. Electronically Signed   By: Merilyn Baba M.D.   On: 12/20/2021 19:05   CT Cervical Spine Wo Contrast  Result Date: 12/20/2021 CLINICAL DATA:  Fall EXAM: CT HEAD WITHOUT CONTRAST CT MAXILLOFACIAL WITHOUT CONTRAST CT CERVICAL SPINE WITHOUT CONTRAST TECHNIQUE: Multidetector CT imaging of the head, cervical spine, and maxillofacial structures were performed using the standard protocol without intravenous contrast. Multiplanar CT image reconstructions of the  cervical spine and maxillofacial structures were also generated. RADIATION DOSE REDUCTION: This exam was performed according to the departmental dose-optimization program which includes automated exposure control, adjustment of the mA and/or kV according to patient size and/or use of iterative reconstruction technique. COMPARISON:  04/07/2019 CT head; no prior CT of the cervical spine or face FINDINGS: CT HEAD FINDINGS Brain: No evidence of acute infarct, hemorrhage, mass, mass effect, or midline shift. No hydrocephalus or extra-axial fluid collection. Vascular: No hyperdense vessel. Skull: Normal. Negative for fracture or focal lesion. CT MAXILLOFACIAL FINDINGS Osseous: No fracture or mandibular dislocation.  Periapical lucency about the roots of the bilateral mandibular first molars. No other destructive process. Orbits: Negative. No traumatic or inflammatory finding. Sinuses: Clear. Soft tissues: Large right periorbital hematoma, which extends into the right premalar and right frontal scalp soft tissues. CT CERVICAL SPINE FINDINGS Alignment: Reversal of the normal cervical lordosis. No traumatic listhesis. Skull base and vertebrae: No acute fracture or suspicious osseous lesion. Soft tissues and spinal canal: No prevertebral fluid or swelling. No visible canal hematoma. Disc levels: Multilevel degenerative changes, most prominent at C4-C5, C5-C6, and C7-T1, where there is mild spinal canal stenosis secondary to disc osteophyte complexes. Multilevel facet and uncovertebral hypertrophy, which causes up to severe neural foraminal narrowing, noted on the right C4-C5 and bilaterally at C5-C6. Upper chest: No focal pulmonary opacity or pleural effusion. Other: None. IMPRESSION: 1.  No acute intracranial process. 2.  No acute fracture or traumatic listhesis in the cervical spine. 3.  No evidence of facial bone fracture. 4. Large right periorbital hematoma, which extends into the right frontal scalp and right premalar  soft tissues. Electronically Signed   By: Merilyn Baba M.D.   On: 12/20/2021 19:05   CT Maxillofacial Wo Contrast  Result Date: 12/20/2021 CLINICAL DATA:  Fall EXAM: CT HEAD WITHOUT CONTRAST CT MAXILLOFACIAL WITHOUT CONTRAST CT CERVICAL SPINE WITHOUT CONTRAST TECHNIQUE: Multidetector CT imaging of the head, cervical spine, and maxillofacial structures were performed using the standard protocol without intravenous contrast. Multiplanar CT image reconstructions of the cervical spine and maxillofacial structures were also generated. RADIATION DOSE REDUCTION: This exam was performed according to the departmental dose-optimization program which includes automated exposure control, adjustment of the mA and/or kV according to patient size and/or use of iterative reconstruction technique. COMPARISON:  04/07/2019 CT head; no prior CT of the cervical spine or face FINDINGS: CT HEAD FINDINGS Brain: No evidence of acute infarct, hemorrhage, mass, mass effect, or midline shift. No hydrocephalus or extra-axial fluid collection. Vascular: No hyperdense vessel. Skull: Normal. Negative for fracture or focal lesion. CT MAXILLOFACIAL FINDINGS Osseous: No fracture or mandibular dislocation. Periapical lucency about the roots of the bilateral mandibular first molars. No other destructive process. Orbits: Negative. No traumatic or inflammatory finding. Sinuses: Clear. Soft tissues: Large right periorbital hematoma, which extends into the right premalar and right frontal scalp soft tissues. CT CERVICAL SPINE FINDINGS Alignment: Reversal of the normal cervical lordosis. No traumatic listhesis. Skull base and vertebrae: No acute fracture or suspicious osseous lesion. Soft tissues and spinal canal: No prevertebral fluid or swelling. No visible canal hematoma. Disc levels: Multilevel degenerative changes, most prominent at C4-C5, C5-C6, and C7-T1, where there is mild spinal canal stenosis secondary to disc osteophyte complexes. Multilevel  facet and uncovertebral hypertrophy, which causes up to severe neural foraminal narrowing, noted on the right C4-C5 and bilaterally at C5-C6. Upper chest: No focal pulmonary opacity or pleural effusion. Other: None. IMPRESSION: 1.  No acute intracranial process. 2.  No acute fracture or traumatic listhesis in the cervical spine. 3.  No evidence of facial bone fracture. 4. Large right periorbital hematoma, which extends into the right frontal scalp and right premalar soft tissues. Electronically Signed   By: Merilyn Baba M.D.   On: 12/20/2021 19:05    Procedures Procedures    Medications Ordered in ED Medications  tetracaine (PONTOCAINE) 0.5 % ophthalmic solution 2 drop (2 drops Both Eyes Given by Other 12/20/21 2319)  fluorescein ophthalmic strip 1 strip (1 strip Both Eyes Given by Other 12/20/21 2320)    ED Course/  Medical Decision Making/ A&P                           Medical Decision Making 71 year old female who presented to the emergency department with swelling around her right eye after mechanical fall.  No evidence of orbital trauma at this time for corneal abrasion.  Her visual acuity is grossly normal.  No APD noted.  Eye pressures were taken and was slightly elevated on the right side but suspect that this may have been artificially elevated due to the force required to open her eye.  Patient did have a CT head, max face, and C-spine which did not show any acute fractures.  No retrobulbar hematoma seen on CT scan.  X-rays of the patient's knees did not show possible bone fragments of indeterminate age but patient does not have any tenderness to palpation is able to range her knee fully so do not suspect these are acute.  No other fractures or injuries noted today.  Patient was discharged with instructions to take Tylenol and use ice as needed for her swelling.  Return precautions discussed prior to discharge.  Amount and/or Complexity of Data Reviewed Radiology:  ordered.  Risk Prescription drug management.   Final Clinical Impression(s) / ED Diagnoses Final diagnoses:  Periorbital hematoma of right eye  Fall (on) (from) other stairs and steps, initial encounter    Rx / DC Orders ED Discharge Orders     None         Fransico Meadow, MD 12/21/21 2015

## 2021-12-26 ENCOUNTER — Ambulatory Visit: Payer: Medicare Other

## 2022-01-09 ENCOUNTER — Ambulatory Visit (INDEPENDENT_AMBULATORY_CARE_PROVIDER_SITE_OTHER): Payer: Medicare Other | Admitting: Family Medicine

## 2022-01-09 ENCOUNTER — Encounter: Payer: Self-pay | Admitting: Family Medicine

## 2022-01-09 VITALS — BP 120/64 | HR 75 | Temp 97.9°F | Ht 61.0 in | Wt 166.2 lb

## 2022-01-09 DIAGNOSIS — Z780 Asymptomatic menopausal state: Secondary | ICD-10-CM

## 2022-01-09 DIAGNOSIS — E1165 Type 2 diabetes mellitus with hyperglycemia: Secondary | ICD-10-CM | POA: Diagnosis not present

## 2022-01-09 DIAGNOSIS — I10 Essential (primary) hypertension: Secondary | ICD-10-CM | POA: Diagnosis not present

## 2022-01-09 LAB — POCT GLYCOSYLATED HEMOGLOBIN (HGB A1C): Hemoglobin A1C: 6.8 % — AB (ref 4.0–5.6)

## 2022-01-09 NOTE — Patient Instructions (Signed)
Schedule bone density scan at [facility]

## 2022-01-09 NOTE — Therapy (Signed)
OUTPATIENT PHYSICAL THERAPY THORACOLUMBAR EVALUATION   Patient Name: Kimberly [name] MRN: [id number] DOB:1950-06-25, 71 y.o., female Today's Date: 01/10/2022   PT End of Session - 01/10/22 0947     Visit Number 1    Date for PT Re-Evaluation 03/07/22    Authorization Type Medicare B- KX at 15    Progress Note Due on Visit 10    PT Start Time 0848    PT Stop Time 0931    PT Time Calculation (min) 43 min    Activity Tolerance Patient tolerated treatment well    Behavior During Therapy [facility] for tasks assessed/performed             Past Medical History:  Diagnosis Date   DDD (degenerative disc disease), lumbar    GERD (gastroesophageal reflux disease)    History of anemia    History of COVID-19    11-11-2020 positive result in care everywhere   History of DVT (deep vein thrombosis) 2003   per pt completed coumadin x 6 mon, never a blood clot prior to 2003 and none since   Hypertension    followed by pcp   (nuclear stress test in epic 05-13-2019, Low risk without ischemia, normal LV and wall motion , ef 68%)   Hypothyroidism    followed by pcp   Lattice degeneration of right retina    OA (osteoarthritis)    Osteopenia    PMB (postmenopausal bleeding)    Pre-diabetes    Vegetarian diet    eats fish and eggs; no meat   Past Surgical History:  Procedure Laterality Date   CATARACT EXTRACTION W/ INTRAOCULAR LENS IMPLANT Bilateral 07/2020   COLONOSCOPY  2016   DILATATION & CURETTAGE/HYSTEROSCOPY WITH MYOSURE N/A 03/27/2021   Procedure: DILATATION & CURETTAGE/HYSTEROSCOPY/Polypectomy WITH MYOSURE;  Surgeon: Azucena Fallen, MD;  Location: [facility];  Service: Gynecology;  Laterality: N/A;   KNEE ARTHROSCOPY Right 03/16/2003   OOPHORECTOMY Left 1999   benign cyst per pt   TOTAL KNEE ARTHROPLASTY Right 09/14/2014   Procedure: RIGHT TOTAL KNEE ARTHROPLASTY;  Surgeon: Garald Balding, MD;  Location: [place];  Service: Orthopedics;  Laterality: Right;   TOTAL KNEE  ARTHROPLASTY Left 11/20/2016   Procedure: LEFT TOTAL KNEE ARTHROPLASTY;  Surgeon: Garald Balding, MD;  Location: [place];  Service: Orthopedics;  Laterality: Left;   Patient Active Problem List   Diagnosis Date Noted   Low back pain 12/12/2021   Dyslipidemia 10/25/2021   Type 2 diabetes mellitus with hyperglycemia ([place]) 04/10/2021   Abnormal weight gain 07/04/2020   Epigastric pain 07/04/2020   Pruritus ani 07/04/2020   S/P total knee replacement using cement, left 11/20/2016   Osteopenia 02/28/2015   Left lumbar radiculitis 02/28/2015   Acute blood loss anemia 09/20/2014   Primary osteoarthritis of right knee 09/14/2014   Osteoarthritis of left knee 09/14/2014   Vitamin B 12 deficiency 07/29/2014   Obesity (BMI 30-39.9) 12/08/2013   Osteoarthritis, knee 79/06/4095   Metabolic syndrome 35/32/9924   DVT, HX OF 02/10/2010   Vitamin D deficiency 08/25/2007   Disorder of bone and cartilage 04/08/2007   Hypothyroidism 11/18/2006   ANEMIA-NOS 11/18/2006   Essential hypertension 11/18/2006   GERD 11/18/2006    PCP: Carolann Littler, MD  REFERRING PROVIDER: [name], MD  REFERRING DIAG: [phone] (ICD-10-CM) - Acute left-sided low back pain with left-sided sciatica  Rationale for Evaluation and Treatment Rehabilitation  THERAPY DIAG:  Other low back pain - Plan: PT plan of care cert/re-cert  Cramp  and spasm - Plan: PT plan of care cert/re-cert  Muscle weakness (generalized) - Plan: PT plan of care cert/re-cert  ONSET DATE: 1.5 months ago  SUBJECTIVE:                                                                                                                                                                                           SUBJECTIVE STATEMENT: Pt presents with a 6 weeks history of LBP that began without incident or injury.   Pt reports that pain sometimes radiates to the Lt hip.  PERTINENT HISTORY:  Bil TKAs  PAIN:  Are you having pain? Yes: NPRS  scale: 3/10 up to 5/10 max  Pain location: Low back Lt>Rt, Lt LE Pain description: Lt foot burning, aching in low back with standing 15-20 Aggravating factors: standing, sleeping wrong  Relieving factors: sitting on donut pillow, sometimes pain medication  Walking is limited to 1.5 miles (3 miles is goal)  PRECAUTIONS: None  WEIGHT BEARING RESTRICTIONS No  FALLS:  Has patient fallen in last 6 months? Yes. Number of falls 1.  Fell down the steps while looking at watch. No balance deficits overall.   LIVING ENVIRONMENT: Lives with: lives with their family Lives in: House/apartment Stairs: Yes: External from garage.    OCCUPATION: retired   PLOF: Independent  PATIENT GOALS reduce LBP   OBJECTIVE:   DIAGNOSTIC FINDINGS:  From MD note: The last MRI was performed in 2021 through the [facility].  She had multilevel mild to moderate degenerative changes and moderate spinal stenosis at L2-3 secondary to disc bulge and facet arthropathy.    PATIENT SURVEYS:  FOTO 60 (goal is 24)  SCREENING FOR RED FLAGS: Bowel or bladder incontinence: No Spinal tumors: No Cauda equina syndrome: No Compression fracture: No Abdominal aneurysm: No  COGNITION:  Overall cognitive status: Within functional limits for tasks assessed     SENSATION: WFL  MUSCLE LENGTH: Hamstring length is limited by 25% with Lt LE pain reported at end range  POSTURE: rounded shoulders, forward head, and flexed trunk   PALPATION: Tension in bil lumbar paraspinals with pain L3-5 bilaterally, trigger points   LUMBAR ROM:  Full with limited lumbar segmental mobility.  Pain with Lt sidebending and lumbar flexion.   LOWER EXTREMITY ROM:    Hip flexibility limited by 25% in hip flexion and 50% in IR/ER  LOWER EXTREMITY MMT:   Bil hip strength 4/5, knee 4+/5    LUMBAR SPECIAL TESTS:  Straight leg raise test: Negative  GAIT: Distance walked: 50 Assistive device utilized: None Level of  assistance: Complete Independence Comments: increased trunk rotation, flexed trunk  TODAY'S TREATMENT  Date: 01/10/22 HEP established-see below    PATIENT EDUCATION:  Education details: Access Code: [email or web address], DN info Person educated: Patient Education method: Explanation, Demonstration, and Handouts Education comprehension: verbalized understanding and returned demonstration   HOME EXERCISE PROGRAM: Access Code: [facility] URL: [email or web address] Date: 01/10/2022 Prepared by: Claiborne Billings  Exercises - Supine Lower Trunk Rotation  - 3 x daily - 7 x weekly - 1 sets - 3 reps - 20 hold - Hooklying Single Knee to Chest  - 3 x daily - 7 x weekly - 1 sets - 3 reps - 20 hold - Seated Hamstring Stretch  - 3 x daily - 7 x weekly - 1 sets - 3 reps - 20 hold - Seated Piriformis Stretch with Trunk Bend  - 3 x daily - 7 x weekly - 1 sets - 3 reps - 20 hold  ASSESSMENT:  CLINICAL IMPRESSION: Patient is a 71 y.o. female who was seen today for physical therapy evaluation and treatment for LBP. Pt began to have LBP and some Lt LE pain that began 6 weeks ago without incident or injury.  Pt rates the pain as 3-5/10 and this increases with sitting on a firm surface, and walking longer distance.  Her walk for exercise is limited to 1.5 miles and a usual distance is 3 miles.  Pt with full lumbar A/ROM with limited segmental mobility and Lt lumbar pain with flexion and Lt sidebending.  Hamstrings are limited by 25% and IR/ER limited by 50%.  Tension and reduced segmental mobility in the lumbar spine L3-5 and trigger points in bil gluteals.  Patient will benefit from skilled PT to address the below impairments and improve overall function.    OBJECTIVE IMPAIRMENTS decreased activity tolerance, difficulty walking, decreased ROM, decreased strength, increased muscle spasms, impaired flexibility, postural dysfunction, and pain.   ACTIVITY LIMITATIONS sitting, standing, squatting, and locomotion  level  PARTICIPATION LIMITATIONS: meal prep, cleaning, laundry, shopping, and community activity  PERSONAL FACTORS Age and 1-2 comorbidities: 2 TKAs, chronic LBP  are also affecting patient's functional outcome.   REHAB POTENTIAL: Good  CLINICAL DECISION MAKING: Stable/uncomplicated  EVALUATION COMPLEXITY: Low   GOALS: Goals reviewed with patient? Yes  SHORT TERM GOALS: Target date: 02/07/2022  Be independent in initial HEP Baseline: Goal status: INITIAL  2.  Report > or = to 30% reduction in LBP with standing and sitting  Baseline:  Goal status: INITIAL  3.  Walk > or = to 2 miles for exercise without limitation due to LBP Baseline: 1.5 miles  Goal status: INITIAL  LONG TERM GOALS: Target date: 03/07/2022  Be independent in advanced HEP Baseline:  Goal status: INITIAL  2.  Reduce FOTO to < or = to 71 Baseline: 60 Goal status: INITIAL  3.  Verbalize and demonstrate body mechanics modifications for lumbar protection with ADLs and self-care Baseline:  Goal status: INITIAL  4.  Report > or = to 70% reduction in LBP with standing and sitting  Baseline: 3-5/10 Goal status: INITIAL  5.  Return to regular walking for exercise up to 3 miles without limitation due to LBP Baseline:  Goal status: INITIAL    PLAN: PT FREQUENCY: 1-2x/week  PT DURATION: 8 weeks  PLANNED INTERVENTIONS: Therapeutic exercises, Therapeutic activity, Neuromuscular re-education, Balance training, Gait training, Patient/Family education, Self Care, Joint mobilization, Stair training, Aquatic Therapy, Dry Needling, Electrical stimulation, Cryotherapy, Moist heat, Taping, and Manual therapy.  PLAN FOR NEXT SESSION: review HEP, body mechanics education, add core and hip strength, manual  including DN if pt agrees    Sigurd Sos, PT 01/10/22 10:37 AM   [place] [address] [place], [place] [address] Phone # [phone] Fax [phone]

## 2022-01-09 NOTE — Progress Notes (Signed)
Established Patient Office Visit  Subjective   Patient ID: Kimberly [name], female    DOB: [birth year]  Age: 71 y.o. MRN: [id number]  Chief Complaint  Patient presents with   Follow-up    HPI   Seen for follow-up diabetes.  Last A1c was 6.7%.  Several months ago we started back metformin currently 500 mg twice daily.  Tolerating fairly well.  Has occasional abdominal cramps.  She has been doing some walking for exercise.  History of osteopenia by prior DEXA scan 2020.  She would like to get follow-up.  She did have a fall recently August night at home.  She basically missed a step and fell striking right side of face and forehead.  Large hematoma superior to the right eye.  She had multiple x-rays including CT head, CT cervical spine, CT maxillofacial, hip x-ray, right hand, and right and left knees.  No acute findings.  No headaches or confusion since then.  No balance issues.  She has hypertension which is currently well controlled.  No recent orthostatic symptoms.  She remains on Avapro and amlodipine.  Past Medical History:  Diagnosis Date   DDD (degenerative disc disease), lumbar    GERD (gastroesophageal reflux disease)    History of anemia    History of COVID-19    11-11-2020 positive result in care everywhere   History of DVT (deep vein thrombosis) 2003   per pt completed coumadin x 6 mon, never a blood clot prior to 2003 and none since   Hypertension    followed by pcp   (nuclear stress test in epic 05-13-2019, Low risk without ischemia, normal LV and wall motion , ef 68%)   Hypothyroidism    followed by pcp   Lattice degeneration of right retina    OA (osteoarthritis)    Osteopenia    PMB (postmenopausal bleeding)    Pre-diabetes    Vegetarian diet    eats fish and eggs; no meat   Past Surgical History:  Procedure Laterality Date   CATARACT EXTRACTION W/ INTRAOCULAR LENS IMPLANT Bilateral 07/2020   COLONOSCOPY  2016   DILATATION & CURETTAGE/HYSTEROSCOPY WITH MYOSURE  N/A 03/27/2021   Procedure: DILATATION & CURETTAGE/HYSTEROSCOPY/Polypectomy WITH MYOSURE;  Surgeon: Azucena Fallen, MD;  Location: [facility];  Service: Gynecology;  Laterality: N/A;   KNEE ARTHROSCOPY Right 03/16/2003   OOPHORECTOMY Left 1999   benign cyst per pt   TOTAL KNEE ARTHROPLASTY Right 09/14/2014   Procedure: RIGHT TOTAL KNEE ARTHROPLASTY;  Surgeon: Garald Balding, MD;  Location: [place];  Service: Orthopedics;  Laterality: Right;   TOTAL KNEE ARTHROPLASTY Left 11/20/2016   Procedure: LEFT TOTAL KNEE ARTHROPLASTY;  Surgeon: Garald Balding, MD;  Location: [place];  Service: Orthopedics;  Laterality: Left;    reports that she has never smoked. She has never used smokeless tobacco. She reports that she does not drink alcohol and does not use drugs. family history includes Alcohol abuse in an other family member; Diabetes in her mother and another family member; Heart failure in her father. Allergies  Allergen Reactions   Enoxaparin Sodium Itching and Swelling    SWELLING REACTION UNSPECIFIED      Review of Systems  Constitutional:  Negative for malaise/fatigue.  Eyes:  Negative for blurred vision.  Respiratory:  Negative for shortness of breath.   Cardiovascular:  Negative for chest pain.  Gastrointestinal:  Negative for abdominal pain.  Neurological:  Negative for dizziness, weakness and headaches.      Objective:  BP 120/64 (BP Location: Left Arm, Patient Position: Sitting, Cuff Size: Normal)   Pulse 75   Temp 97.9 F (36.6 C) (Oral)   Ht '5\' 1"'$  (1.549 m)   Wt 166 lb 3.2 oz (75.4 kg)   LMP 05/14/1998   SpO2 98%   BMI 31.40 kg/m    Physical Exam Vitals reviewed.  Constitutional:      Appearance: Normal appearance.  HENT:     Head:     Comments: She has hematoma lateral and superior to the right orbit.  Mild surrounding bruising.  She states this has gone down substantially compared with 3 weeks ago. Cardiovascular:     Rate and Rhythm:  Normal rate and regular rhythm.  Pulmonary:     Effort: Pulmonary effort is normal.     Breath sounds: Normal breath sounds.  Musculoskeletal:     Right lower leg: No edema.     Left lower leg: No edema.  Neurological:     Mental Status: She is alert.      No results found for any visits on 01/09/22.    The 10-year ASCVD risk score (Arnett DK, et al., 2019) is: 27%    Assessment & Plan:   #1 type 2 diabetes stable with A1c 6.8%.  Increase walking and scale back sugar intake further.  Plan 51-monthfollow-up.  We did discuss other potential options for treating her diabetes if climbing at that time.  Continue metformin 500 mg twice daily  #2 hypertension stable and well-controlled on irbesartan and amlodipine  #3 history of osteopenia.  Last DEXA scan 3 years ago.  Set up repeat DEXA   Return in about 3 months (around 04/11/2022).    BCarolann Littler MD

## 2022-01-10 ENCOUNTER — Other Ambulatory Visit: Payer: Self-pay

## 2022-01-10 ENCOUNTER — Ambulatory Visit: Payer: Medicare Other | Attending: Orthopaedic Surgery

## 2022-01-10 DIAGNOSIS — M6281 Muscle weakness (generalized): Secondary | ICD-10-CM | POA: Diagnosis not present

## 2022-01-10 DIAGNOSIS — M5442 Lumbago with sciatica, left side: Secondary | ICD-10-CM | POA: Insufficient documentation

## 2022-01-10 DIAGNOSIS — R252 Cramp and spasm: Secondary | ICD-10-CM | POA: Diagnosis not present

## 2022-01-10 DIAGNOSIS — M5459 Other low back pain: Secondary | ICD-10-CM | POA: Diagnosis not present

## 2022-01-10 NOTE — Patient Instructions (Signed)

## 2022-01-11 ENCOUNTER — Ambulatory Visit (INDEPENDENT_AMBULATORY_CARE_PROVIDER_SITE_OTHER)
Admission: RE | Admit: 2022-01-11 | Discharge: 2022-01-11 | Disposition: A | Discharge status: Home / Self Care | Payer: Medicare Other | Source: Ambulatory Visit | Attending: Family Medicine | Admitting: Family Medicine

## 2022-01-11 DIAGNOSIS — Z780 Asymptomatic menopausal state: Secondary | ICD-10-CM

## 2022-01-16 ENCOUNTER — Ambulatory Visit: Payer: Medicare Other | Attending: Orthopaedic Surgery

## 2022-01-16 DIAGNOSIS — M6281 Muscle weakness (generalized): Secondary | ICD-10-CM | POA: Insufficient documentation

## 2022-01-16 DIAGNOSIS — M5459 Other low back pain: Secondary | ICD-10-CM | POA: Insufficient documentation

## 2022-01-16 DIAGNOSIS — R252 Cramp and spasm: Secondary | ICD-10-CM | POA: Insufficient documentation

## 2022-01-16 NOTE — Therapy (Signed)
OUTPATIENT PHYSICAL THERAPY THORACOLUMBAR EVALUATION   Patient Name: Kimberly [name] MRN: [id number] DOB:1951/03/11, 70 y.o., female Today's Date: 01/16/2022   PT End of Session - 01/16/22 1058     Visit Number 2    Date for PT Re-Evaluation 03/07/22    Authorization Type Medicare B- KX at 15    Progress Note Due on Visit 10    PT Start Time 1017    PT Stop Time 1059    PT Time Calculation (min) 42 min    Activity Tolerance Patient tolerated treatment well    Behavior During Therapy WFL for tasks assessed/performed              Past Medical History:  Diagnosis Date   DDD (degenerative disc disease), lumbar    GERD (gastroesophageal reflux disease)    History of anemia    History of COVID-19    11-11-2020 positive result in care everywhere   History of DVT (deep vein thrombosis) 2003   per pt completed coumadin x 6 mon, never a blood clot prior to 2003 and none since   Hypertension    followed by pcp   (nuclear stress test in epic 05-13-2019, Low risk without ischemia, normal LV and wall motion , ef 68%)   Hypothyroidism    followed by pcp   Lattice degeneration of right retina    OA (osteoarthritis)    Osteopenia    PMB (postmenopausal bleeding)    Pre-diabetes    Vegetarian diet    eats fish and eggs; no meat   Past Surgical History:  Procedure Laterality Date   CATARACT EXTRACTION W/ INTRAOCULAR LENS IMPLANT Bilateral 07/2020   COLONOSCOPY  2016   DILATATION & CURETTAGE/HYSTEROSCOPY WITH MYOSURE N/A 03/27/2021   Procedure: DILATATION & CURETTAGE/HYSTEROSCOPY/Polypectomy WITH MYOSURE;  Surgeon: Azucena Fallen, MD;  Location: [facility];  Service: Gynecology;  Laterality: N/A;   KNEE ARTHROSCOPY Right 03/16/2003   OOPHORECTOMY Left 1999   benign cyst per pt   TOTAL KNEE ARTHROPLASTY Right 09/14/2014   Procedure: RIGHT TOTAL KNEE ARTHROPLASTY;  Surgeon: Garald Balding, MD;  Location: [place];  Service: Orthopedics;  Laterality: Right;   TOTAL KNEE  ARTHROPLASTY Left 11/20/2016   Procedure: LEFT TOTAL KNEE ARTHROPLASTY;  Surgeon: Garald Balding, MD;  Location: [place];  Service: Orthopedics;  Laterality: Left;   Patient Active Problem List   Diagnosis Date Noted   Low back pain 12/12/2021   Dyslipidemia 10/25/2021   Type 2 diabetes mellitus with hyperglycemia ([place]) 04/10/2021   Abnormal weight gain 07/04/2020   Epigastric pain 07/04/2020   Pruritus ani 07/04/2020   S/P total knee replacement using cement, left 11/20/2016   Osteopenia 02/28/2015   Left lumbar radiculitis 02/28/2015   Acute blood loss anemia 09/20/2014   Primary osteoarthritis of right knee 09/14/2014   Osteoarthritis of left knee 09/14/2014   Vitamin B 12 deficiency 07/29/2014   Obesity (BMI 30-39.9) 12/08/2013   Osteoarthritis, knee 13/24/4010   Metabolic syndrome 27/25/3664   DVT, HX OF 02/10/2010   Vitamin D deficiency 08/25/2007   Disorder of bone and cartilage 04/08/2007   Hypothyroidism 11/18/2006   ANEMIA-NOS 11/18/2006   Essential hypertension 11/18/2006   GERD 11/18/2006    PCP: Carolann Littler, MD  REFERRING PROVIDER: [name], MD  REFERRING DIAG: [phone] (ICD-10-CM) - Acute left-sided low back pain with left-sided sciatica  Rationale for Evaluation and Treatment Rehabilitation  THERAPY DIAG:  Other low back pain  Cramp and spasm  Muscle weakness (generalized)  ONSET DATE: 1.5 months ago  SUBJECTIVE:                                                                                                                                                                                           SUBJECTIVE STATEMENT: I am feeling better overall.    PERTINENT HISTORY:  Bil TKAs  PAIN:  Are you having pain? Yes: NPRS scale: 3/10 up to 5/10 max  Pain location: Low back Lt>Rt, Lt LE Pain description: Lt foot burning, aching in low back with standing 15-20 Aggravating factors: standing, sleeping wrong  Relieving factors: sitting on  donut pillow, sometimes pain medication  Walking is limited to 1.5 miles (3 miles is goal)  PRECAUTIONS: None  WEIGHT BEARING RESTRICTIONS No  FALLS:  Has patient fallen in last 6 months? Yes. Number of falls 1.  Fell down the steps while looking at watch. No balance deficits overall.   LIVING ENVIRONMENT: Lives with: lives with their family Lives in: House/apartment Stairs: Yes: External from garage.    OCCUPATION: retired   PLOF: Independent  PATIENT GOALS reduce LBP   OBJECTIVE:   DIAGNOSTIC FINDINGS:  From MD note: The last MRI was performed in 2021 through the [facility].  She had multilevel mild to moderate degenerative changes and moderate spinal stenosis at L2-3 secondary to disc bulge and facet arthropathy.    PATIENT SURVEYS:  FOTO 60 (goal is 66)  SCREENING FOR RED FLAGS: Bowel or bladder incontinence: No Spinal tumors: No Cauda equina syndrome: No Compression fracture: No Abdominal aneurysm: No  COGNITION:  Overall cognitive status: Within functional limits for tasks assessed     SENSATION: WFL  MUSCLE LENGTH: Hamstring length is limited by 25% with Lt LE pain reported at end range  POSTURE: rounded shoulders, forward head, and flexed trunk   PALPATION: Tension in bil lumbar paraspinals with pain L3-5 bilaterally, trigger points   LUMBAR ROM:  Full with limited lumbar segmental mobility.  Pain with Lt sidebending and lumbar flexion.   LOWER EXTREMITY ROM:    Hip flexibility limited by 25% in hip flexion and 50% in IR/ER  LOWER EXTREMITY MMT:   Bil hip strength 4/5, knee 4+/5    LUMBAR SPECIAL TESTS:  Straight leg raise test: Negative  GAIT: Distance walked: 50 Assistive device utilized: None Level of assistance: Complete Independence Comments: increased trunk rotation, flexed trunk  TODAY'S TREATMENT  Date: 01/16/22 NuStep: Level 5x 8 min-PT present to discuss progress  Seated hamstring stretch and piriforimis 3x20 seconds   Trunk rotation 3x20 seconds  Single knee to chest: 3x20 seconds  Trigger Point Dry-Needling  Treatment  instructions: Expect mild to moderate muscle soreness. S/S of pneumothorax if dry needled over a lung field, and to seek immediate medical attention should they occur. Patient verbalized understanding of these instructions and education.  Patient Consent Given: Yes Education handout provided: Previously provided Muscles treated: bil gluteals and bil lumbar multifidi Treatment response/outcome: Utilized skilled palpation to identify trigger points.  During dry needling able to palpate muscle twitch and muscle elongation  Elongation to bil lumbar spine and gluteals  Skilled palpation and monitoring by PT during dry needling   Date: 01/10/22 HEP established-see below    PATIENT EDUCATION:  Education details: Access Code: [email or web address], DN info Person educated: Patient Education method: Explanation, Demonstration, and Handouts Education comprehension: verbalized understanding and returned demonstration   HOME EXERCISE PROGRAM: Access Code: [facility] URL: [email or web address] Date: 01/10/2022 Prepared by: Claiborne Billings  Exercises - Supine Lower Trunk Rotation  - 3 x daily - 7 x weekly - 1 sets - 3 reps - 20 hold - Hooklying Single Knee to Chest  - 3 x daily - 7 x weekly - 1 sets - 3 reps - 20 hold - Seated Hamstring Stretch  - 3 x daily - 7 x weekly - 1 sets - 3 reps - 20 hold - Seated Piriformis Stretch with Trunk Bend  - 3 x daily - 7 x weekly - 1 sets - 3 reps - 20 hold  ASSESSMENT:  CLINICAL IMPRESSION: First time follow-up after evaluation. Pt was able to walk 2.3 miles yesterdan and didn't have pain with this.  Pt is independent and compliant in HEP and is able to demonstrate all aspects correctly.  PT offered a modification for seated piriformis stretch as this was not comfortable to her knee.  Pt with tension and trigger points in bil gluteals and lumbar spine and had good  twitch response to manual therapy with improved tissue mobility after today.  Patient will benefit from skilled PT to address the below impairments and improve overall function.    OBJECTIVE IMPAIRMENTS decreased activity tolerance, difficulty walking, decreased ROM, decreased strength, increased muscle spasms, impaired flexibility, postural dysfunction, and pain.   ACTIVITY LIMITATIONS sitting, standing, squatting, and locomotion level  PARTICIPATION LIMITATIONS: meal prep, cleaning, laundry, shopping, and community activity  PERSONAL FACTORS Age and 1-2 comorbidities: 2 TKAs, chronic LBP  are also affecting patient's functional outcome.   REHAB POTENTIAL: Good  CLINICAL DECISION MAKING: Stable/uncomplicated  EVALUATION COMPLEXITY: Low   GOALS: Goals reviewed with patient? Yes  SHORT TERM GOALS: Target date: 02/07/2022  Be independent in initial HEP Baseline: Goal status: In progress   2.  Report > or = to 30% reduction in LBP with standing and sitting  Baseline:  Goal status: INITIAL  3.  Walk > or = to 2 miles for exercise without limitation due to LBP Baseline: 2.30 yesterday Goal status: met  LONG TERM GOALS: Target date: 03/07/2022  Be independent in advanced HEP Baseline:  Goal status: INITIAL  2.  Reduce FOTO to < or = to 71 Baseline: 60 Goal status: INITIAL  3.  Verbalize and demonstrate body mechanics modifications for lumbar protection with ADLs and self-care Baseline:  Goal status: INITIAL  4.  Report > or = to 70% reduction in LBP with standing and sitting  Baseline: 3-5/10 Goal status: INITIAL  5.  Return to regular walking for exercise up to 3 miles without limitation due to LBP Baseline:  Goal status: INITIAL    PLAN: PT FREQUENCY: 1-2x/week  PT DURATION: 8 weeks  PLANNED INTERVENTIONS: Therapeutic exercises, Therapeutic activity, Neuromuscular re-education, Balance training, Gait training, Patient/Family education, Self Care, Joint  mobilization, Stair training, Aquatic Therapy, Dry Needling, Electrical stimulation, Cryotherapy, Moist heat, Taping, and Manual therapy.  PLAN FOR NEXT SESSION:  body mechanics education, add core and hip strength, assess response to DN   Sigurd Sos, PT 01/16/22 10:59 AM   [facility] [address], [place], [place] [address] Phone # [phone] Fax [phone]

## 2022-01-31 ENCOUNTER — Ambulatory Visit: Payer: Medicare Other

## 2022-01-31 DIAGNOSIS — R252 Cramp and spasm: Secondary | ICD-10-CM

## 2022-01-31 DIAGNOSIS — Z23 Encounter for immunization: Secondary | ICD-10-CM | POA: Diagnosis not present

## 2022-01-31 DIAGNOSIS — M5459 Other low back pain: Secondary | ICD-10-CM | POA: Diagnosis not present

## 2022-01-31 DIAGNOSIS — M6281 Muscle weakness (generalized): Secondary | ICD-10-CM | POA: Diagnosis not present

## 2022-01-31 NOTE — Therapy (Signed)
OUTPATIENT PHYSICAL THERAPY THORACOLUMBAR EVALUATION   Patient Name: Kimberly [name] MRN: [id number] DOB:[birth year], 71 y.o., female Today's Date: 01/31/2022   PT End of Session - 01/31/22 1141     Visit Number 3    Date for PT Re-Evaluation 03/07/22    Authorization Type Medicare B- KX at 15    Progress Note Due on Visit 10    PT Start Time 1101    PT Stop Time 1141    PT Time Calculation (min) 40 min    Activity Tolerance Patient tolerated treatment well    Behavior During Therapy [facility] for tasks assessed/performed               Past Medical History:  Diagnosis Date   DDD (degenerative disc disease), lumbar    GERD (gastroesophageal reflux disease)    History of anemia    History of COVID-19    11-11-2020 positive result in care everywhere   History of DVT (deep vein thrombosis) 2003   per pt completed coumadin x 6 mon, never a blood clot prior to 2003 and none since   Hypertension    followed by pcp   (nuclear stress test in epic 05-13-2019, Low risk without ischemia, normal LV and wall motion , ef 68%)   Hypothyroidism    followed by pcp   Lattice degeneration of right retina    OA (osteoarthritis)    Osteopenia    PMB (postmenopausal bleeding)    Pre-diabetes    Vegetarian diet    eats fish and eggs; no meat   Past Surgical History:  Procedure Laterality Date   CATARACT EXTRACTION W/ INTRAOCULAR LENS IMPLANT Bilateral 07/2020   COLONOSCOPY  2016   DILATATION & CURETTAGE/HYSTEROSCOPY WITH MYOSURE N/A 03/27/2021   Procedure: DILATATION & CURETTAGE/HYSTEROSCOPY/Polypectomy WITH MYOSURE;  Surgeon: Azucena Fallen, MD;  Location: [facility];  Service: Gynecology;  Laterality: N/A;   KNEE ARTHROSCOPY Right 03/16/2003   OOPHORECTOMY Left 1999   benign cyst per pt   TOTAL KNEE ARTHROPLASTY Right 09/14/2014   Procedure: RIGHT TOTAL KNEE ARTHROPLASTY;  Surgeon: Garald Balding, MD;  Location: [place];  Service: Orthopedics;  Laterality: Right;   TOTAL  KNEE ARTHROPLASTY Left 11/20/2016   Procedure: LEFT TOTAL KNEE ARTHROPLASTY;  Surgeon: Garald Balding, MD;  Location: [place];  Service: Orthopedics;  Laterality: Left;   Patient Active Problem List   Diagnosis Date Noted   Low back pain 12/12/2021   Dyslipidemia 10/25/2021   Type 2 diabetes mellitus with hyperglycemia ([place]) 04/10/2021   Abnormal weight gain 07/04/2020   Epigastric pain 07/04/2020   Pruritus ani 07/04/2020   S/P total knee replacement using cement, left 11/20/2016   Osteopenia 02/28/2015   Left lumbar radiculitis 02/28/2015   Acute blood loss anemia 09/20/2014   Primary osteoarthritis of right knee 09/14/2014   Osteoarthritis of left knee 09/14/2014   Vitamin B 12 deficiency 07/29/2014   Obesity (BMI 30-39.9) 12/08/2013   Osteoarthritis, knee 26/83/4196   Metabolic syndrome 22/29/7989   DVT, HX OF 02/10/2010   Vitamin D deficiency 08/25/2007   Disorder of bone and cartilage 04/08/2007   Hypothyroidism 11/18/2006   ANEMIA-NOS 11/18/2006   Essential hypertension 11/18/2006   GERD 11/18/2006    PCP: Carolann Littler, MD  REFERRING PROVIDER: [name], MD  REFERRING DIAG: [phone] (ICD-10-CM) - Acute left-sided low back pain with left-sided sciatica  Rationale for Evaluation and Treatment Rehabilitation  THERAPY DIAG:  Other low back pain  Cramp and spasm  Muscle weakness (  generalized)  ONSET DATE: 1.5 months ago  SUBJECTIVE:                                                                                                                                                                                           SUBJECTIVE STATEMENT: I think the needling really helped.  I feel 20% better overall.     PERTINENT HISTORY:  Bil TKAs  PAIN:  Are you having pain? Yes: NPRS scale: 1-2/10 Pain location: Low back Lt>Rt, Lt LE Pain description: Lt foot burning, aching in low back with standing 15-20 Aggravating factors: standing, sleeping wrong   Relieving factors: sitting on donut pillow, sometimes pain medication  Walking is limited to 1.5 miles (3 miles is goal)  PRECAUTIONS: None  WEIGHT BEARING RESTRICTIONS No  FALLS:  Has patient fallen in last 6 months? Yes. Number of falls 1.  Fell down the steps while looking at watch. No balance deficits overall.   LIVING ENVIRONMENT: Lives with: lives with their family Lives in: House/apartment Stairs: Yes: External from garage.    OCCUPATION: retired   PLOF: Independent  PATIENT GOALS reduce LBP   OBJECTIVE:   DIAGNOSTIC FINDINGS:  From MD note: The last MRI was performed in 2021 through the [facility].  She had multilevel mild to moderate degenerative changes and moderate spinal stenosis at L2-3 secondary to disc bulge and facet arthropathy.    PATIENT SURVEYS:  FOTO 60 (goal is 15)  SCREENING FOR RED FLAGS: Bowel or bladder incontinence: No Spinal tumors: No Cauda equina syndrome: No Compression fracture: No Abdominal aneurysm: No  COGNITION:  Overall cognitive status: Within functional limits for tasks assessed     SENSATION: WFL  MUSCLE LENGTH: Hamstring length is limited by 25% with Lt LE pain reported at end range  POSTURE: rounded shoulders, forward head, and flexed trunk   PALPATION: Tension in bil lumbar paraspinals with pain L3-5 bilaterally, trigger points   LUMBAR ROM:  Full with limited lumbar segmental mobility.  Pain with Lt sidebending and lumbar flexion.   LOWER EXTREMITY ROM:    Hip flexibility limited by 25% in hip flexion and 50% in IR/ER  LOWER EXTREMITY MMT:   Bil hip strength 4/5, knee 4+/5    LUMBAR SPECIAL TESTS:  Straight leg raise test: Negative  GAIT: Distance walked: 50 Assistive device utilized: None Level of assistance: Complete Independence Comments: increased trunk rotation, flexed trunk  TODAY'S TREATMENT  Date: 01/31/22 NuStep: Level 3 x 8 min-PT present to discuss progress  Sit to stand:  2x10 Sideyling clam 2x10 bil each Supine ball squeeze with TA activation 5" hold 2x10 Seated  hamstring stretch and piriforimis 3x20 seconds  Trunk rotation 3x20 seconds  Single knee to chest: 3x20 seconds  Trigger Point Dry-Needling  Treatment instructions: Expect mild to moderate muscle soreness. S/S of pneumothorax if dry needled over a lung field, and to seek immediate medical attention should they occur. Patient verbalized understanding of these instructions and education.  Patient Consent Given: Yes Education handout provided: Previously provided Muscles treated: bil gluteals and bil lumbar multifidi Treatment response/outcome: Utilized skilled palpation to identify trigger points.  During dry needling able to palpate muscle twitch and muscle elongation  Elongation to bil lumbar spine and gluteals  Skilled palpation and monitoring by PT during dry needling   Date: 01/16/22 NuStep: Level 5x 8 min-PT present to discuss progress  Seated hamstring stretch and piriforimis 3x20 seconds  Trunk rotation 3x20 seconds  Single knee to chest: 3x20 seconds  Trigger Point Dry-Needling  Treatment instructions: Expect mild to moderate muscle soreness. S/S of pneumothorax if dry needled over a lung field, and to seek immediate medical attention should they occur. Patient verbalized understanding of these instructions and education.  Patient Consent Given: Yes Education handout provided: Previously provided Muscles treated: bil gluteals and bil lumbar multifidi Treatment response/outcome: Utilized skilled palpation to identify trigger points.  During dry needling able to palpate muscle twitch and muscle elongation  Elongation to bil lumbar spine and gluteals  Skilled palpation and monitoring by PT during dry needling   Date: 01/10/22 HEP established-see below   PATIENT EDUCATION:  Education details: Access Code: [email or web address], DN info Person educated: Patient Education method: Explanation,  Demonstration, and Handouts Education comprehension: verbalized understanding and returned demonstration   HOME EXERCISE PROGRAM: Access Code: [facility] URL: [email or web address] Date: 01/31/2022 Prepared by: Claiborne Billings  Exercises - Supine Lower Trunk Rotation  - 3 x daily - 7 x weekly - 1 sets - 3 reps - 20 hold - Hooklying Single Knee to Chest  - 3 x daily - 7 x weekly - 1 sets - 3 reps - 20 hold - Seated Hamstring Stretch  - 3 x daily - 7 x weekly - 1 sets - 3 reps - 20 hold - Seated Piriformis Stretch with Trunk Bend  - 3 x daily - 7 x weekly - 1 sets - 3 reps - 20 hold - Clamshell  - 1 x daily - 7 x weekly - 2 sets - 10 reps - Sit to Stand Without Arm Support  - 1 x daily - 7 x weekly - 2 sets - 10 reps - Supine Hip Adduction Isometric with Ball  - 1 x daily - 7 x weekly - 2 sets - 10 reps - 5 hold ASSESSMENT:  CLINICAL IMPRESSION: Pt reports 20% overall improvement in symptoms since the start of care. Pt is independent and compliant in HEP and PT added core strength to HEP.  Verbal cues provided for core activation and coordinating breath with this. Pt with tension and trigger points in bil gluteals and lumbar spine and had good twitch response to manual therapy with improved tissue mobility after today.  Patient will benefit from skilled PT to address the below impairments and improve overall function.    OBJECTIVE IMPAIRMENTS decreased activity tolerance, difficulty walking, decreased ROM, decreased strength, increased muscle spasms, impaired flexibility, postural dysfunction, and pain.   ACTIVITY LIMITATIONS sitting, standing, squatting, and locomotion level  PARTICIPATION LIMITATIONS: meal prep, cleaning, laundry, shopping, and community activity  PERSONAL FACTORS Age and 1-2 comorbidities: 2 TKAs, chronic LBP  are also affecting patient's  functional outcome.   REHAB POTENTIAL: Good  CLINICAL DECISION MAKING: Stable/uncomplicated  EVALUATION COMPLEXITY:  Low   GOALS: Goals reviewed with patient? Yes  SHORT TERM GOALS: Target date: 02/07/2022  Be independent in initial HEP Baseline: Goal status: In progress   2.  Report > or = to 30% reduction in LBP with standing and sitting  Baseline: 20% (01/31/22) Goal status: In progress   3.  Walk > or = to 2 miles for exercise without limitation due to LBP Baseline: 2.30 yesterday Goal status: met  LONG TERM GOALS: Target date: 03/07/2022  Be independent in advanced HEP Baseline:  Goal status: INITIAL  2.  Reduce FOTO to < or = to 71 Baseline: 60 Goal status: INITIAL  3.  Verbalize and demonstrate body mechanics modifications for lumbar protection with ADLs and self-care Baseline:  Goal status: INITIAL  4.  Report > or = to 70% reduction in LBP with standing and sitting  Baseline: 3-5/10 Goal status: INITIAL  5.  Return to regular walking for exercise up to 3 miles without limitation due to LBP Baseline:  Goal status: INITIAL    PLAN: PT FREQUENCY: 1-2x/week  PT DURATION: 8 weeks  PLANNED INTERVENTIONS: Therapeutic exercises, Therapeutic activity, Neuromuscular re-education, Balance training, Gait training, Patient/Family education, Self Care, Joint mobilization, Stair training, Aquatic Therapy, Dry Needling, Electrical stimulation, Cryotherapy, Moist heat, Taping, and Manual therapy.  PLAN FOR NEXT SESSION:  body mechanics education, add core and hip strength, assess response to DN   Sigurd Sos, PT 01/31/22 11:42 AM   [facility] [address], [place], [place] [address] Phone # [phone] Fax [phone]

## 2022-02-02 DIAGNOSIS — S0083XA Contusion of other part of head, initial encounter: Secondary | ICD-10-CM | POA: Diagnosis not present

## 2022-02-05 ENCOUNTER — Ambulatory Visit: Payer: Medicare Other | Admitting: Physical Therapy

## 2022-02-05 ENCOUNTER — Encounter: Payer: Self-pay | Admitting: Physical Therapy

## 2022-02-05 DIAGNOSIS — M5459 Other low back pain: Secondary | ICD-10-CM

## 2022-02-05 DIAGNOSIS — M6281 Muscle weakness (generalized): Secondary | ICD-10-CM | POA: Diagnosis not present

## 2022-02-05 DIAGNOSIS — R252 Cramp and spasm: Secondary | ICD-10-CM | POA: Diagnosis not present

## 2022-02-05 NOTE — Therapy (Signed)
OUTPATIENT PHYSICAL THERAPY THORACOLUMBAR EVALUATION   Patient Name: Kimberly [name] MRN: [id number] DOB:Nov 01, 1950, 71 y.o., female Today's Date: 02/05/2022   PT End of Session - 02/05/22 1106     Visit Number 4    Date for PT Re-Evaluation 03/07/22    Authorization Type Medicare B- KX at 15    Progress Note Due on Visit 10    PT Start Time 1103    PT Stop Time 1144    PT Time Calculation (min) 41 min    Activity Tolerance Patient tolerated treatment well    Behavior During Therapy WFL for tasks assessed/performed                Past Medical History:  Diagnosis Date   DDD (degenerative disc disease), lumbar    GERD (gastroesophageal reflux disease)    History of anemia    History of COVID-19    11-11-2020 positive result in care everywhere   History of DVT (deep vein thrombosis) 2003   per pt completed coumadin x 6 mon, never a blood clot prior to 2003 and none since   Hypertension    followed by pcp   (nuclear stress test in epic 05-13-2019, Low risk without ischemia, normal LV and wall motion , ef 68%)   Hypothyroidism    followed by pcp   Lattice degeneration of right retina    OA (osteoarthritis)    Osteopenia    PMB (postmenopausal bleeding)    Pre-diabetes    Vegetarian diet    eats fish and eggs; no meat   Past Surgical History:  Procedure Laterality Date   CATARACT EXTRACTION W/ INTRAOCULAR LENS IMPLANT Bilateral 07/2020   COLONOSCOPY  2016   DILATATION & CURETTAGE/HYSTEROSCOPY WITH MYOSURE N/A 03/27/2021   Procedure: DILATATION & CURETTAGE/HYSTEROSCOPY/Polypectomy WITH MYOSURE;  Surgeon: Azucena Fallen, MD;  Location: [facility];  Service: Gynecology;  Laterality: N/A;   KNEE ARTHROSCOPY Right 03/16/2003   OOPHORECTOMY Left 1999   benign cyst per pt   TOTAL KNEE ARTHROPLASTY Right 09/14/2014   Procedure: RIGHT TOTAL KNEE ARTHROPLASTY;  Surgeon: Garald Balding, MD;  Location: [place];  Service: Orthopedics;  Laterality: Right;   TOTAL  KNEE ARTHROPLASTY Left 11/20/2016   Procedure: LEFT TOTAL KNEE ARTHROPLASTY;  Surgeon: Garald Balding, MD;  Location: [place];  Service: Orthopedics;  Laterality: Left;   Patient Active Problem List   Diagnosis Date Noted   Low back pain 12/12/2021   Dyslipidemia 10/25/2021   Type 2 diabetes mellitus with hyperglycemia ([name]) 04/10/2021   Abnormal weight gain 07/04/2020   Epigastric pain 07/04/2020   Pruritus ani 07/04/2020   S/P total knee replacement using cement, left 11/20/2016   Osteopenia 02/28/2015   Left lumbar radiculitis 02/28/2015   Acute blood loss anemia 09/20/2014   Primary osteoarthritis of right knee 09/14/2014   Osteoarthritis of left knee 09/14/2014   Vitamin B 12 deficiency 07/29/2014   Obesity (BMI 30-39.9) 12/08/2013   Osteoarthritis, knee 76/73/4193   Metabolic syndrome 79/06/4095   DVT, HX OF 02/10/2010   Vitamin D deficiency 08/25/2007   Disorder of bone and cartilage 04/08/2007   Hypothyroidism 11/18/2006   ANEMIA-NOS 11/18/2006   Essential hypertension 11/18/2006   GERD 11/18/2006    PCP: Carolann Littler, MD  REFERRING PROVIDER: [name], MD  REFERRING DIAG: [phone] (ICD-10-CM) - Acute left-sided low back pain with left-sided sciatica  Rationale for Evaluation and Treatment Rehabilitation  THERAPY DIAG:  Other low back pain  Cramp and spasm  Muscle  weakness (generalized)  ONSET DATE: 1.5 months ago  SUBJECTIVE:                                                                                                                                                                                           SUBJECTIVE STATEMENT: I think the needling really helped. Very little back pain today.    PERTINENT HISTORY:  Bil TKAs  PAIN:  Are you having pain? Yes: NPRS scale: 1-2/10 Pain location: Low back Lt>Rt, Lt LE Pain description: Lt foot burning, aching in low back with standing 15-20 Aggravating factors: standing, sleeping wrong   Relieving factors: sitting on donut pillow, sometimes pain medication  Walking is limited to 1.5 miles (3 miles is goal)  PRECAUTIONS: None  WEIGHT BEARING RESTRICTIONS No  FALLS:  Has patient fallen in last 6 months? Yes. Number of falls 1.  Fell down the steps while looking at watch. No balance deficits overall.   LIVING ENVIRONMENT: Lives with: lives with their family Lives in: House/apartment Stairs: Yes: External from garage.    OCCUPATION: retired   PLOF: Independent  PATIENT GOALS reduce LBP   OBJECTIVE:   DIAGNOSTIC FINDINGS:  From MD note: The last MRI was performed in 2021 through the [facility].  She had multilevel mild to moderate degenerative changes and moderate spinal stenosis at L2-3 secondary to disc bulge and facet arthropathy.    PATIENT SURVEYS:  FOTO 60 (goal is 51)  SCREENING FOR RED FLAGS: Bowel or bladder incontinence: No Spinal tumors: No Cauda equina syndrome: No Compression fracture: No Abdominal aneurysm: No  COGNITION:  Overall cognitive status: Within functional limits for tasks assessed     SENSATION: WFL  MUSCLE LENGTH: Hamstring length is limited by 25% with Lt LE pain reported at end range  POSTURE: rounded shoulders, forward head, and flexed trunk   PALPATION: Tension in bil lumbar paraspinals with pain L3-5 bilaterally, trigger points   LUMBAR ROM:  Full with limited lumbar segmental mobility.  Pain with Lt sidebending and lumbar flexion.   LOWER EXTREMITY ROM:    Hip flexibility limited by 25% in hip flexion and 50% in IR/ER  LOWER EXTREMITY MMT:   Bil hip strength 4/5, knee 4+/5    LUMBAR SPECIAL TESTS:  Straight leg raise test: Negative  GAIT: Distance walked: 50 Assistive device utilized: None Level of assistance: Complete Independence Comments: increased trunk rotation, flexed trunk  TODAY'S TREATMENT   02/05/22: Nustep L5 x 62mn PTA present to discuss progress. Sit to stand holding 5# KB  2x10 Seated clamshell blue loop 2x10 Supine blue loop with TA 10x Supine SLR with TA  activation 10x Bil Supine bridge 10x 3 sec hold Dying bug holding 1# weight 10x Sidelying abd 0#10x Bil  V C for core Supine hip ext with green band 10x Bil  Forward step ups Bil 10x holding on to Both rails Standing hip abd 10x Bil a Barre    Date: 01/31/22 NuStep: Level 3 x 8 min-PT present to discuss progress  Sit to stand: 2x10 Sideyling clam 2x10 bil each Supine ball squeeze with TA activation 5" hold 2x10 Seated hamstring stretch and piriforimis 3x20 seconds  Trunk rotation 3x20 seconds  Single knee to chest: 3x20 seconds  Trigger Point Dry-Needling  Treatment instructions: Expect mild to moderate muscle soreness. S/S of pneumothorax if dry needled over a lung field, and to seek immediate medical attention should they occur. Patient verbalized understanding of these instructions and education.  Patient Consent Given: Yes Education handout provided: Previously provided Muscles treated: bil gluteals and bil lumbar multifidi Treatment response/outcome: Utilized skilled palpation to identify trigger points.  During dry needling able to palpate muscle twitch and muscle elongation  Elongation to bil lumbar spine and gluteals  Skilled palpation and monitoring by PT during dry needling   Date: 01/16/22 NuStep: Level 5x 8 min-PT present to discuss progress  Seated hamstring stretch and piriforimis 3x20 seconds  Trunk rotation 3x20 seconds  Single knee to chest: 3x20 seconds  Trigger Point Dry-Needling  Treatment instructions: Expect mild to moderate muscle soreness. S/S of pneumothorax if dry needled over a lung field, and to seek immediate medical attention should they occur. Patient verbalized understanding of these instructions and education.  Patient Consent Given: Yes Education handout provided: Previously provided Muscles treated: bil gluteals and bil lumbar multifidi Treatment  response/outcome: Utilized skilled palpation to identify trigger points.  During dry needling able to palpate muscle twitch and muscle elongation  Elongation to bil lumbar spine and gluteals  Skilled palpation and monitoring by PT during dry needling   Date: 01/10/22 HEP established-see below   PATIENT EDUCATION:  Education details: Access Code: [email or web address], DN info Person educated: Patient Education method: Explanation, Demonstration, and Handouts Education comprehension: verbalized understanding and returned demonstration   HOME EXERCISE PROGRAM: Access Code: [facility] URL: [email or web address] Date: 01/31/2022 Prepared by: Claiborne Billings  Exercises - Supine Lower Trunk Rotation  - 3 x daily - 7 x weekly - 1 sets - 3 reps - 20 hold - Hooklying Single Knee to Chest  - 3 x daily - 7 x weekly - 1 sets - 3 reps - 20 hold - Seated Hamstring Stretch  - 3 x daily - 7 x weekly - 1 sets - 3 reps - 20 hold - Seated Piriformis Stretch with Trunk Bend  - 3 x daily - 7 x weekly - 1 sets - 3 reps - 20 hold - Clamshell  - 1 x daily - 7 x weekly - 2 sets - 10 reps - Sit to Stand Without Arm Support  - 1 x daily - 7 x weekly - 2 sets - 10 reps - Supine Hip Adduction Isometric with Ball  - 1 x daily - 7 x weekly - 2 sets - 10 reps - 5 hold ASSESSMENT:  CLINICAL IMPRESSION: Pt reports her pain continues to improve, the dry needling has helped tremendously. Pt reports difficulty in doing her HEP. Pt tolerated all resistive exercises well with no pain today. Pt reports she is interested in doing some aquatic PT.   OBJECTIVE IMPAIRMENTS decreased activity tolerance, difficulty walking, decreased ROM, decreased strength, increased muscle  spasms, impaired flexibility, postural dysfunction, and pain.   ACTIVITY LIMITATIONS sitting, standing, squatting, and locomotion level  PARTICIPATION LIMITATIONS: meal prep, cleaning, laundry, shopping, and community activity  PERSONAL FACTORS Age and 1-2  comorbidities: 2 TKAs, chronic LBP  are also affecting patient's functional outcome.   REHAB POTENTIAL: Good  CLINICAL DECISION MAKING: Stable/uncomplicated  EVALUATION COMPLEXITY: Low   GOALS: Goals reviewed with patient? Yes  SHORT TERM GOALS: Target date: 02/07/2022  Be independent in initial HEP Baseline: Goal status: In progress   2.  Report > or = to 30% reduction in LBP with standing and sitting  Baseline: 20% (01/31/22) Goal status: In progress   3.  Walk > or = to 2 miles for exercise without limitation due to LBP Baseline: 2.30 yesterday Goal status: met  LONG TERM GOALS: Target date: 03/07/2022  Be independent in advanced HEP Baseline:  Goal status: INITIAL  2.  Reduce FOTO to < or = to 71 Baseline: 60 Goal status: INITIAL  3.  Verbalize and demonstrate body mechanics modifications for lumbar protection with ADLs and self-care Baseline:  Goal status: INITIAL  4.  Report > or = to 70% reduction in LBP with standing and sitting  Baseline: 3-5/10 Goal status: INITIAL  5.  Return to regular walking for exercise up to 3 miles without limitation due to LBP Baseline:  Goal status: INITIAL    PLAN: PT FREQUENCY: 1-2x/week  PT DURATION: 8 weeks  PLANNED INTERVENTIONS: Therapeutic exercises, Therapeutic activity, Neuromuscular re-education, Balance training, Gait training, Patient/Family education, Self Care, Joint mobilization, Stair training, Aquatic Therapy, Dry Needling, Electrical stimulation, Cryotherapy, Moist heat, Taping, and Manual therapy.  PLAN FOR NEXT SESSION:  Continue with core and hip strength, especially Lt side. Talk to pt about a few pool sessions. Dry needling when indicated.    Myrene Galas, PTA 02/05/22 11:45 AM   [facility] [address], [address] [place], [place] [address] Phone # [phone] Fax [phone]

## 2022-02-07 ENCOUNTER — Ambulatory Visit: Payer: Medicare Other

## 2022-02-07 DIAGNOSIS — M6281 Muscle weakness (generalized): Secondary | ICD-10-CM | POA: Diagnosis not present

## 2022-02-07 DIAGNOSIS — M5459 Other low back pain: Secondary | ICD-10-CM

## 2022-02-07 DIAGNOSIS — R252 Cramp and spasm: Secondary | ICD-10-CM | POA: Diagnosis not present

## 2022-02-07 NOTE — Therapy (Signed)
OUTPATIENT PHYSICAL THERAPY TREATMENT      Patient Name: Kimberly [name] MRN: [id number] DOB:[birth year], 71 y.o., female Today's Date: 02/07/2022   PT End of Session - 02/07/22 1151     Visit Number 5    Date for PT Re-Evaluation 03/07/22    Authorization Type Medicare B- KX at 15    Progress Note Due on Visit 10    PT Start Time 1104    PT Stop Time 1150    PT Time Calculation (min) 46 min    Activity Tolerance Patient tolerated treatment well    Behavior During Therapy [facility] for tasks assessed/performed                 Past Medical History:  Diagnosis Date   DDD (degenerative disc disease), lumbar    GERD (gastroesophageal reflux disease)    History of anemia    History of COVID-19    11-11-2020 positive result in care everywhere   History of DVT (deep vein thrombosis) 2003   per pt completed coumadin x 6 mon, never a blood clot prior to 2003 and none since   Hypertension    followed by pcp   (nuclear stress test in epic 05-13-2019, Low risk without ischemia, normal LV and wall motion , ef 68%)   Hypothyroidism    followed by pcp   Lattice degeneration of right retina    OA (osteoarthritis)    Osteopenia    PMB (postmenopausal bleeding)    Pre-diabetes    Vegetarian diet    eats fish and eggs; no meat   Past Surgical History:  Procedure Laterality Date   CATARACT EXTRACTION W/ INTRAOCULAR LENS IMPLANT Bilateral 07/2020   COLONOSCOPY  2016   DILATATION & CURETTAGE/HYSTEROSCOPY WITH MYOSURE N/A 03/27/2021   Procedure: DILATATION & CURETTAGE/HYSTEROSCOPY/Polypectomy WITH MYOSURE;  Surgeon: Azucena Fallen, MD;  Location: [facility];  Service: Gynecology;  Laterality: N/A;   KNEE ARTHROSCOPY Right 03/16/2003   OOPHORECTOMY Left 1999   benign cyst per pt   TOTAL KNEE ARTHROPLASTY Right 09/14/2014   Procedure: RIGHT TOTAL KNEE ARTHROPLASTY;  Surgeon: Garald Balding, MD;  Location: [place];  Service: Orthopedics;  Laterality: Right;   TOTAL KNEE  ARTHROPLASTY Left 11/20/2016   Procedure: LEFT TOTAL KNEE ARTHROPLASTY;  Surgeon: Garald Balding, MD;  Location: [place];  Service: Orthopedics;  Laterality: Left;   Patient Active Problem List   Diagnosis Date Noted   Low back pain 12/12/2021   Dyslipidemia 10/25/2021   Type 2 diabetes mellitus with hyperglycemia ([place]) 04/10/2021   Abnormal weight gain 07/04/2020   Epigastric pain 07/04/2020   Pruritus ani 07/04/2020   S/P total knee replacement using cement, left 11/20/2016   Osteopenia 02/28/2015   Left lumbar radiculitis 02/28/2015   Acute blood loss anemia 09/20/2014   Primary osteoarthritis of right knee 09/14/2014   Osteoarthritis of left knee 09/14/2014   Vitamin B 12 deficiency 07/29/2014   Obesity (BMI 30-39.9) 12/08/2013   Osteoarthritis, knee 24/40/1027   Metabolic syndrome 25/36/6440   DVT, HX OF 02/10/2010   Vitamin D deficiency 08/25/2007   Disorder of bone and cartilage 04/08/2007   Hypothyroidism 11/18/2006   ANEMIA-NOS 11/18/2006   Essential hypertension 11/18/2006   GERD 11/18/2006    PCP: Carolann Littler, MD  REFERRING PROVIDER: [name], MD  REFERRING DIAG: [phone] (ICD-10-CM) - Acute left-sided low back pain with left-sided sciatica  Rationale for Evaluation and Treatment Rehabilitation  THERAPY DIAG:  Other low back pain  Cramp and  spasm  Muscle weakness (generalized)  ONSET DATE: 1.5 months ago  SUBJECTIVE:                                                                                                                                                                                           SUBJECTIVE STATEMENT: I went to the gym yesterday and I am a little sore.  I'm feeling better overall 25-30%   PERTINENT HISTORY:  Bil TKAs  PAIN:  Are you having pain? Yes: NPRS scale: 2/10 Pain location: Low back Lt>Rt, Lt LE Pain description: Lt foot burning, aching in low back with standing 15-20 Aggravating factors: standing, sleeping  wrong  Relieving factors: sitting on donut pillow, sometimes pain medication  Walking is limited to 1.5 miles (3 miles is goal)  PRECAUTIONS: None  WEIGHT BEARING RESTRICTIONS No  FALLS:  Has patient fallen in last 6 months? Yes. Number of falls 1.  Fell down the steps while looking at watch. No balance deficits overall.   LIVING ENVIRONMENT: Lives with: lives with their family Lives in: House/apartment Stairs: Yes: External from garage.    OCCUPATION: retired   PLOF: Independent  PATIENT GOALS reduce LBP  OBJECTIVE:   DIAGNOSTIC FINDINGS:  From MD note: The last MRI was performed in 2021 through the [facility].  She had multilevel mild to moderate degenerative changes and moderate spinal stenosis at L2-3 secondary to disc bulge and facet arthropathy.    PATIENT SURVEYS:  FOTO 60 (goal is 15)  SCREENING FOR RED FLAGS: Bowel or bladder incontinence: No Spinal tumors: No Cauda equina syndrome: No Compression fracture: No Abdominal aneurysm: No  COGNITION:  Overall cognitive status: Within functional limits for tasks assessed     SENSATION: WFL  MUSCLE LENGTH: Hamstring length is limited by 25% with Lt LE pain reported at end range  POSTURE: rounded shoulders, forward head, and flexed trunk   PALPATION: Tension in bil lumbar paraspinals with pain L3-5 bilaterally, trigger points   LUMBAR ROM:  Full with limited lumbar segmental mobility.  Pain with Lt sidebending and lumbar flexion.   LOWER EXTREMITY ROM:    Hip flexibility limited by 25% in hip flexion and 50% in IR/ER  LOWER EXTREMITY MMT:   Bil hip strength 4/5, knee 4+/5    LUMBAR SPECIAL TESTS:  Straight leg raise test: Negative  GAIT: Distance walked: 50 Assistive device utilized: None Level of assistance: Complete Independence Comments: increased trunk rotation, flexed trunk  TODAY'S TREATMENT  02/07/22: Nustep L5 x 56mn PTA present to discuss progress. Sit to stand holding 5# KB  2x10 Seated clamshell blue loop 2x10 Seated hamstring stretch  3x20 seconds  Supine SLR with TA activation 10x Bil Supine bridge 10x 3 sec hold Dying bug holding 1# weight 10x Sidelying abd 0#10x Bil  V C for core  Trigger Point Dry-Needling  Treatment instructions: Expect mild to moderate muscle soreness. S/S of pneumothorax if dry needled over a lung field, and to seek immediate medical attention should they occur. Patient verbalized understanding of these instructions and education.  Patient Consent Given: Yes Education handout provided: Previously provided Muscles treated: bil gluteals and bil lumbar multifidi Treatment response/outcome: Utilized skilled palpation to identify trigger points.  During dry needling able to palpate muscle twitch and muscle elongation  Elongation to bil lumbar spine and gluteals  Skilled palpation and monitoring by PT during dry needling  02/05/22: Nustep L5 x 78mn PTA present to discuss progress. Sit to stand holding 5# KB 2x10 Seated clamshell blue loop 2x10 Supine blue loop with TA 10x Supine SLR with TA activation 10x Bil Supine bridge 10x 3 sec hold Dying bug holding 1# weight 10x Sidelying abd 0#10x Bil  V C for core Supine hip ext with green band 10x Bil  Forward step ups Bil 10x holding on to Both rails Standing hip abd 10x Bil a Barre    Date: 01/31/22 NuStep: Level 3 x 8 min-PT present to discuss progress  Sit to stand: 2x10 Sideyling clam 2x10 bil each Supine ball squeeze with TA activation 5" hold 2x10 Seated hamstring stretch and piriforimis 3x20 seconds  Trunk rotation 3x20 seconds  Single knee to chest: 3x20 seconds  Trigger Point Dry-Needling  Treatment instructions: Expect mild to moderate muscle soreness. S/S of pneumothorax if dry needled over a lung field, and to seek immediate medical attention should they occur. Patient verbalized understanding of these instructions and education.  Patient Consent Given: Yes Education  handout provided: Previously provided Muscles treated: bil gluteals and bil lumbar multifidi Treatment response/outcome: Utilized skilled palpation to identify trigger points.  During dry needling able to palpate muscle twitch and muscle elongation  Elongation to bil lumbar spine and gluteals  Skilled palpation and monitoring by PT during dry needling   PATIENT EDUCATION:  Education details: Access Code: [email or web address], DN info Person educated: Patient Education method: Explanation, Demonstration, and Handouts Education comprehension: verbalized understanding and returned demonstration   [facility] EXERCISE PROGRAM: [facility] [email or web address] Date: 02/07/2022 Prepared by: KClaiborne Billings Exercises - Supine Lower Trunk Rotation  - 3 x daily - 7 x weekly - 1 sets - 3 reps - 20 hold - Hooklying Single Knee to Chest  - 3 x daily - 7 x weekly - 1 sets - 3 reps - 20 hold - Seated Hamstring Stretch  - 3 x daily - 7 x weekly - 1 sets - 3 reps - 20 hold - Seated Piriformis Stretch with Trunk Bend  - 3 x daily - 7 x weekly - 1 sets - 3 reps - 20 hold - Clamshell  - 1 x daily - 7 x weekly - 2 sets - 10 reps - Sit to Stand Without Arm Support  - 1 x daily - 7 x weekly - 2 sets - 10 reps - Supine Hip Adduction Isometric with Ball  - 1 x daily - 7 x weekly - 2 sets - 10 reps - 5 hold - Supine Active Straight Leg Raise  - 1 x daily - 7 x weekly - 1 sets - 10 reps - Sidelying Hip Abduction  - 1 x daily - 7 x weekly -  1 sets - 10 reps - Supine Bridge  - 1 x daily - 7 x weekly - 1 sets - 10 reps ASSESSMENT:  CLINICAL IMPRESSION: Pt reports 25-30% overall reduction in pain since the start of care.  Pt is independent and compliant with HEP and returned to the gym for exercise this week.  Pt with Lt>Rt strength deficits in hips.  Pt with trigger points and tension in lumbar spine and Lt gluteals and had good response to DN and elongation of tissue after.    OBJECTIVE IMPAIRMENTS  decreased activity tolerance, difficulty walking, decreased ROM, decreased strength, increased muscle spasms, impaired flexibility, postural dysfunction, and pain.   ACTIVITY LIMITATIONS sitting, standing, squatting, and locomotion level  PARTICIPATION LIMITATIONS: meal prep, cleaning, laundry, shopping, and community activity  PERSONAL FACTORS Age and 1-2 comorbidities: 2 TKAs, chronic LBP  are also affecting patient's functional outcome.   REHAB POTENTIAL: Good  CLINICAL DECISION MAKING: Stable/uncomplicated  EVALUATION COMPLEXITY: Low   GOALS: Goals reviewed with patient? Yes  SHORT TERM GOALS: Target date: 02/07/2022  Be independent in initial HEP Baseline: Goal status: MET  2.  Report > or = to 30% reduction in LBP with standing and sitting  Baseline: 25-30% (02/07/22) Goal status: In progress   3.  Walk > or = to 2 miles for exercise without limitation due to LBP Baseline: 2.30 yesterday Goal status: met  LONG TERM GOALS: Target date: 03/07/2022  Be independent in advanced HEP Baseline:  Goal status: INITIAL  2.  Reduce FOTO to < or = to 71 Baseline: 60 Goal status: INITIAL  3.  Verbalize and demonstrate body mechanics modifications for lumbar protection with ADLs and self-care Baseline:  Goal status: INITIAL  4.  Report > or = to 70% reduction in LBP with standing and sitting  Baseline: 25-30% (02/07/22) Goal status: In progress   5.  Return to regular walking for exercise up to 3 miles without limitation due to LBP Baseline:  Goal status: INITIAL    PLAN: PT FREQUENCY: 1-2x/week  PT DURATION: 8 weeks  PLANNED INTERVENTIONS: Therapeutic exercises, Therapeutic activity, Neuromuscular re-education, Balance training, Gait training, Patient/Family education, Self Care, Joint mobilization, Stair training, Aquatic Therapy, Dry Needling, Electrical stimulation, Cryotherapy, Moist heat, Taping, and Manual therapy.  PLAN FOR NEXT SESSION:  Continue with core  and hip strength, especially Lt side. DN as helpful for tissue mobility.  Pt will start with aquatics next week.     Sigurd Sos, PT 02/07/22 11:55 AM   [address] [place], [place] [address] Phone # [phone] Fax [phone]

## 2022-02-07 NOTE — Patient Instructions (Signed)
     [facility] Welcome to [place]! Here you will find all the information you will need regarding your pool therapy. If you have further questions at any time, please call our office at [phone]. After completing your initial evaluation in the [place] clinic, you may be eligible to complete a portion of your therapy in the pool. A typical week of therapy will consist of 1-2 typical physical therapy visits at our [place] location and an additional session of therapy in the pool located at the [facility]. [address], [place]. The phone number at the pool site is [phone]. Please call this number if you are running late or need to cancel your appointment.  Aquatic therapy will be offered on Wednesday mornings and Friday afternoons. Each session will last approximately 45 minutes. All scheduling and payments for aquatic therapy sessions, including cancelations, will be done through our [place] location.  To be eligible for aquatic therapy, these criteria must be met: You must be able to independently change in the locker room and get to the pool deck. A caregiver can come with you to help if needed. There are benches for a caregiver to sit on next to the pool. No one with an open wound is permitted in the pool.  Handicap parking is available in the front and there is a drop off option for even closer accessibility. Please arrive 15 minutes prior to your appointment to prepare for your pool session. You must sign in at the front desk upon your arrival. Please be sure to attend to any toileting needs prior to entering the pool. [place] rooms for changing are available.  There is direct access to the pool deck from the locker room. You can lock your belongings in a locker or bring them with you poolside. Your therapist will greet you on the pool deck. There may be other swimmers in the pool at the  same time but your session is one-on-one with the therapist.

## 2022-02-12 ENCOUNTER — Ambulatory Visit: Payer: Medicare Other | Attending: Orthopaedic Surgery

## 2022-02-12 DIAGNOSIS — R252 Cramp and spasm: Secondary | ICD-10-CM | POA: Diagnosis not present

## 2022-02-12 DIAGNOSIS — M5459 Other low back pain: Secondary | ICD-10-CM | POA: Insufficient documentation

## 2022-02-12 DIAGNOSIS — M6281 Muscle weakness (generalized): Secondary | ICD-10-CM | POA: Diagnosis not present

## 2022-02-12 NOTE — Therapy (Signed)
OUTPATIENT PHYSICAL THERAPY TREATMENT      Patient Name: Kimberly [name] MRN: [id number] DOB:26-Jun-1950, 71 y.o., female Today's Date: 02/12/2022   PT End of Session - 02/12/22 1134     Visit Number 6    Date for PT Re-Evaluation 03/07/22    Authorization Type Medicare B- KX at 15    Progress Note Due on Visit 10    PT Start Time 1102    PT Stop Time 1140    PT Time Calculation (min) 38 min    Activity Tolerance Patient tolerated treatment well    Behavior During Therapy WFL for tasks assessed/performed                  Past Medical History:  Diagnosis Date   DDD (degenerative disc disease), lumbar    GERD (gastroesophageal reflux disease)    History of anemia    History of COVID-19    11-11-2020 positive result in care everywhere   History of DVT (deep vein thrombosis) 2003   per pt completed coumadin x 6 mon, never a blood clot prior to 2003 and none since   Hypertension    followed by pcp   (nuclear stress test in epic 05-13-2019, Low risk without ischemia, normal LV and wall motion , ef 68%)   Hypothyroidism    followed by pcp   Lattice degeneration of right retina    OA (osteoarthritis)    Osteopenia    PMB (postmenopausal bleeding)    Pre-diabetes    Vegetarian diet    eats fish and eggs; no meat   Past Surgical History:  Procedure Laterality Date   CATARACT EXTRACTION W/ INTRAOCULAR LENS IMPLANT Bilateral 07/2020   COLONOSCOPY  2016   DILATATION & CURETTAGE/HYSTEROSCOPY WITH MYOSURE N/A 03/27/2021   Procedure: DILATATION & CURETTAGE/HYSTEROSCOPY/Polypectomy WITH MYOSURE;  Surgeon: Azucena Fallen, MD;  Location: [facility];  Service: Gynecology;  Laterality: N/A;   KNEE ARTHROSCOPY Right 03/16/2003   OOPHORECTOMY Left 1999   benign cyst per pt   TOTAL KNEE ARTHROPLASTY Right 09/14/2014   Procedure: RIGHT TOTAL KNEE ARTHROPLASTY;  Surgeon: Garald Balding, MD;  Location: [place];  Service: Orthopedics;  Laterality: Right;   TOTAL KNEE  ARTHROPLASTY Left 11/20/2016   Procedure: LEFT TOTAL KNEE ARTHROPLASTY;  Surgeon: Garald Balding, MD;  Location: [place];  Service: Orthopedics;  Laterality: Left;   Patient Active Problem List   Diagnosis Date Noted   Low back pain 12/12/2021   Dyslipidemia 10/25/2021   Type 2 diabetes mellitus with hyperglycemia ([place]) 04/10/2021   Abnormal weight gain 07/04/2020   Epigastric pain 07/04/2020   Pruritus ani 07/04/2020   S/P total knee replacement using cement, left 11/20/2016   Osteopenia 02/28/2015   Left lumbar radiculitis 02/28/2015   Acute blood loss anemia 09/20/2014   Primary osteoarthritis of right knee 09/14/2014   Osteoarthritis of left knee 09/14/2014   Vitamin B 12 deficiency 07/29/2014   Obesity (BMI 30-39.9) 12/08/2013   Osteoarthritis, knee 01/60/1093   Metabolic syndrome 23/55/7322   DVT, HX OF 02/10/2010   Vitamin D deficiency 08/25/2007   Disorder of bone and cartilage 04/08/2007   Hypothyroidism 11/18/2006   ANEMIA-NOS 11/18/2006   Essential hypertension 11/18/2006   GERD 11/18/2006    PCP: Carolann Littler, MD  REFERRING PROVIDER: [name], MD  REFERRING DIAG: [phone] (ICD-10-CM) - Acute left-sided low back pain with left-sided sciatica  Rationale for Evaluation and Treatment Rehabilitation  THERAPY DIAG:  Other low back pain  Cramp  and spasm  Muscle weakness (generalized)  ONSET DATE: 1.5 months ago  SUBJECTIVE:                                                                                                                                                                                           SUBJECTIVE STATEMENT: I am still feeling better.  I went to the gym yesterday and I walked for 40 minutes.    PERTINENT HISTORY:  Bil TKAs  PAIN:  Are you having pain? Yes: NPRS scale: 0/10 Pain location: Low back Lt>Rt, Lt LE Pain description: Lt foot burning, aching in low back with standing 15-20 Aggravating factors: standing, sleeping  wrong  Relieving factors: sitting on donut pillow, sometimes pain medication  Walking is limited to 1.5 miles (3 miles is goal)  PRECAUTIONS: None  WEIGHT BEARING RESTRICTIONS No  FALLS:  Has patient fallen in last 6 months? Yes. Number of falls 1.  Fell down the steps while looking at watch. No balance deficits overall.   LIVING ENVIRONMENT: Lives with: lives with their family Lives in: House/apartment Stairs: Yes: External from garage.    OCCUPATION: retired   PLOF: Independent  PATIENT GOALS reduce LBP  OBJECTIVE:   DIAGNOSTIC FINDINGS:  From MD note: The last MRI was performed in 2021 through the [facility].  She had multilevel mild to moderate degenerative changes and moderate spinal stenosis at L2-3 secondary to disc bulge and facet arthropathy.    PATIENT SURVEYS:  FOTO 60 (goal is 1)  SCREENING FOR RED FLAGS: Bowel or bladder incontinence: No Spinal tumors: No Cauda equina syndrome: No Compression fracture: No Abdominal aneurysm: No  COGNITION:  Overall cognitive status: Within functional limits for tasks assessed     SENSATION: WFL  MUSCLE LENGTH: Hamstring length is limited by 25% with Lt LE pain reported at end range  POSTURE: rounded shoulders, forward head, and flexed trunk   PALPATION: Tension in bil lumbar paraspinals with pain L3-5 bilaterally, trigger points   LUMBAR ROM:  Full with limited lumbar segmental mobility.  Pain with Lt sidebending and lumbar flexion.   LOWER EXTREMITY ROM:    Hip flexibility limited by 25% in hip flexion and 50% in IR/ER  LOWER EXTREMITY MMT:   Bil hip strength 4/5, knee 4+/5    LUMBAR SPECIAL TESTS:  Straight leg raise test: Negative  GAIT: Distance walked: 50 Assistive device utilized: None Level of assistance: Complete Independence Comments: increased trunk rotation, flexed trunk  TODAY'S TREATMENT  02/12/22: Nustep L5 x 38mn PT present to discuss progress. Seated hamstring stretch:  20 seconds x 3 each Sit to stand holding 5#  KB 2x10 Supine clamshell blue loop with TA activation 2x10 Seated hamstring stretch 3x20 seconds  Supine SLR with TA activation 10x Bil Supine bridge 10x 3 sec hold Dying bug holding 1# weight 10x Sidelying abd 0#10x Bil  V C for core 02/07/22: Nustep L5 x 2mn PT present to discuss progress. Sit to stand holding 5# KB 2x10 Seated clamshell blue loop 2x10 Seated hamstring stretch 3x20 seconds  Supine SLR with TA activation 10x Bil Supine bridge 10x 3 sec hold Dying bug holding 1# weight 10x Sidelying abd 0#10x Bil  V C for core  Trigger Point Dry-Needling  Treatment instructions: Expect mild to moderate muscle soreness. S/S of pneumothorax if dry needled over a lung field, and to seek immediate medical attention should they occur. Patient verbalized understanding of these instructions and education.  Patient Consent Given: Yes Education handout provided: Previously provided Muscles treated: bil gluteals and bil lumbar multifidi Treatment response/outcome: Utilized skilled palpation to identify trigger points.  During dry needling able to palpate muscle twitch and muscle elongation  Elongation to bil lumbar spine and gluteals  Skilled palpation and monitoring by PT during dry needling  02/05/22: Nustep L5 x 849m PTA present to discuss progress. Sit to stand holding 5# KB 2x10 Seated clamshell blue loop 2x10 Supine blue loop with TA 10x Supine SLR with TA activation 10x Bil Supine bridge 10x 3 sec hold Dying bug holding 1# weight 10x Sidelying abd 0#10x Bil  V C for core Supine hip ext with green band 10x Bil  Forward step ups Bil 10x holding on to Both rails Standing hip abd 10x Bil a Barre    Date: 01/31/22 NuStep: Level 3 x 8 min-PT present to discuss progress  Sit to stand: 2x10 Sideyling clam 2x10 bil each Supine ball squeeze with TA activation 5" hold 2x10 Seated hamstring stretch and piriforimis 3x20 seconds  Trunk rotation  3x20 seconds  Single knee to chest: 3x20 seconds  Trigger Point Dry-Needling  Treatment instructions: Expect mild to moderate muscle soreness. S/S of pneumothorax if dry needled over a lung field, and to seek immediate medical attention should they occur. Patient verbalized understanding of these instructions and education.  Patient Consent Given: Yes Education handout provided: Previously provided Muscles treated: bil gluteals and bil lumbar multifidi Treatment response/outcome: Utilized skilled palpation to identify trigger points.  During dry needling able to palpate muscle twitch and muscle elongation  Elongation to bil lumbar spine and gluteals  Skilled palpation and monitoring by PT during dry needling   PATIENT EDUCATION:  Education details: Access Code: [email or web address], DN info Person educated: Patient Education method: Explanation, Demonstration, and Handouts Education comprehension: verbalized understanding and returned demonstration   HOME EXERCISE PROGRAM: Access Code: DPSent[facility] HospitalRL: [email or web address] Date: 02/07/2022 Prepared by: KeClaiborne BillingsExercises - Supine Lower Trunk Rotation  - 3 x daily - 7 x weekly - 1 sets - 3 reps - 20 hold - Hooklying Single Knee to Chest  - 3 x daily - 7 x weekly - 1 sets - 3 reps - 20 hold - Seated Hamstring Stretch  - 3 x daily - 7 x weekly - 1 sets - 3 reps - 20 hold - Seated Piriformis Stretch with Trunk Bend  - 3 x daily - 7 x weekly - 1 sets - 3 reps - 20 hold - Clamshell  - 1 x daily - 7 x weekly - 2 sets - 10 reps - Sit to Stand Without Arm Support  - 1 x daily -  7 x weekly - 2 sets - 10 reps - Supine Hip Adduction Isometric with Ball  - 1 x daily - 7 x weekly - 2 sets - 10 reps - 5 hold - Supine Active Straight Leg Raise  - 1 x daily - 7 x weekly - 1 sets - 10 reps - Sidelying Hip Abduction  - 1 x daily - 7 x weekly - 1 sets - 10 reps - Supine Bridge  - 1 x daily - 7 x weekly - 1 sets - 10 reps ASSESSMENT:  CLINICAL  IMPRESSION: Pt continues to report 25-30% overall reduction in pain since the start of care.  Pt is independent and compliant with HEP and returned to the gym for her regular routine.  Pt is able to walk 45 minutes with her husband and denies any pain with this.  Pt arrived without pain today and did well with exercise in the clinic today.  PT provided verbal and tactile cues for technique with exercise today.  Pt will attend aquatic PT next session.  Patient will benefit from skilled PT to address the below impairments and improve overall function.   OBJECTIVE IMPAIRMENTS decreased activity tolerance, difficulty walking, decreased ROM, decreased strength, increased muscle spasms, impaired flexibility, postural dysfunction, and pain.   ACTIVITY LIMITATIONS sitting, standing, squatting, and locomotion level  PARTICIPATION LIMITATIONS: meal prep, cleaning, laundry, shopping, and community activity  PERSONAL FACTORS Age and 1-2 comorbidities: 2 TKAs, chronic LBP  are also affecting patient's functional outcome.   REHAB POTENTIAL: Good  CLINICAL DECISION MAKING: Stable/uncomplicated  EVALUATION COMPLEXITY: Low   GOALS: Goals reviewed with patient? Yes  SHORT TERM GOALS: Target date: 02/07/2022  Be independent in initial HEP Baseline: Goal status: MET  2.  Report > or = to 30% reduction in LBP with standing and sitting  Baseline: 25-30% (02/07/22) Goal status: In progress   3.  Walk > or = to 2 miles for exercise without limitation due to LBP Baseline: 2.30 yesterday Goal status: met  LONG TERM GOALS: Target date: 03/07/2022  Be independent in advanced HEP Baseline:  Goal status: INITIAL  2.  Reduce FOTO to < or = to 71 Baseline: 60 Goal status: INITIAL  3.  Verbalize and demonstrate body mechanics modifications for lumbar protection with ADLs and self-care Baseline:  Goal status: INITIAL  4.  Report > or = to 70% reduction in LBP with standing and sitting  Baseline:  25-30% (02/07/22) Goal status: In progress   5.  Return to regular walking for exercise up to 3 miles without limitation due to LBP Baseline:  Goal status: INITIAL    PLAN: PT FREQUENCY: 1-2x/week  PT DURATION: 8 weeks  PLANNED INTERVENTIONS: Therapeutic exercises, Therapeutic activity, Neuromuscular re-education, Balance training, Gait training, Patient/Family education, Self Care, Joint mobilization, Stair training, Aquatic Therapy, Dry Needling, Electrical stimulation, Cryotherapy, Moist heat, Taping, and Manual therapy.  PLAN FOR NEXT SESSION:  Continue with core and hip strength, especially Lt side. DN as helpful for tissue mobility.  Pt will start with aquatics next week.     Sigurd Sos, PT 02/12/22 11:36 AM   [facility] [address] [place], [place] [address] Phone # [phone] Fax [phone]

## 2022-02-14 ENCOUNTER — Ambulatory Visit: Payer: Medicare Other | Admitting: Physical Therapy

## 2022-02-16 DIAGNOSIS — H10413 Chronic giant papillary conjunctivitis, bilateral: Secondary | ICD-10-CM | POA: Diagnosis not present

## 2022-02-19 ENCOUNTER — Ambulatory Visit: Payer: Medicare Other | Admitting: Physical Therapy

## 2022-02-19 ENCOUNTER — Encounter: Payer: Self-pay | Admitting: Physical Therapy

## 2022-02-19 DIAGNOSIS — R252 Cramp and spasm: Secondary | ICD-10-CM

## 2022-02-19 DIAGNOSIS — M6281 Muscle weakness (generalized): Secondary | ICD-10-CM

## 2022-02-19 DIAGNOSIS — M5459 Other low back pain: Secondary | ICD-10-CM

## 2022-02-19 NOTE — Therapy (Signed)
OUTPATIENT PHYSICAL THERAPY TREATMENT      Patient Name: Kimberly [name] MRN: [id number] DOB:1950-09-19, 71 y.o., female Today's Date: 02/19/2022   PT End of Session - 02/19/22 1103     Visit Number 7    Date for PT Re-Evaluation 03/07/22    Authorization Type Medicare B- KX at 15    Progress Note Due on Visit 10    PT Start Time 1101    PT Stop Time 7564    PT Time Calculation (min) 30 min    Activity Tolerance Patient tolerated treatment well    Behavior During Therapy [facility] for tasks assessed/performed                   Past Medical History:  Diagnosis Date   DDD (degenerative disc disease), lumbar    GERD (gastroesophageal reflux disease)    History of anemia    History of COVID-19    11-11-2020 positive result in care everywhere   History of DVT (deep vein thrombosis) 2003   per pt completed coumadin x 6 mon, never a blood clot prior to 2003 and none since   Hypertension    followed by pcp   (nuclear stress test in epic 05-13-2019, Low risk without ischemia, normal LV and wall motion , ef 68%)   Hypothyroidism    followed by pcp   Lattice degeneration of right retina    OA (osteoarthritis)    Osteopenia    PMB (postmenopausal bleeding)    Pre-diabetes    Vegetarian diet    eats fish and eggs; no meat   Past Surgical History:  Procedure Laterality Date   CATARACT EXTRACTION W/ INTRAOCULAR LENS IMPLANT Bilateral 07/2020   COLONOSCOPY  2016   DILATATION & CURETTAGE/HYSTEROSCOPY WITH MYOSURE N/A 03/27/2021   Procedure: DILATATION & CURETTAGE/HYSTEROSCOPY/Polypectomy WITH MYOSURE;  Surgeon: Azucena Fallen, MD;  Location: [facility];  Service: Gynecology;  Laterality: N/A;   KNEE ARTHROSCOPY Right 03/16/2003   OOPHORECTOMY Left 1999   benign cyst per pt   TOTAL KNEE ARTHROPLASTY Right 09/14/2014   Procedure: RIGHT TOTAL KNEE ARTHROPLASTY;  Surgeon: Garald Balding, MD;  Location: [place];  Service: Orthopedics;  Laterality: Right;   TOTAL KNEE  ARTHROPLASTY Left 11/20/2016   Procedure: LEFT TOTAL KNEE ARTHROPLASTY;  Surgeon: Garald Balding, MD;  Location: [place];  Service: Orthopedics;  Laterality: Left;   Patient Active Problem List   Diagnosis Date Noted   Low back pain 12/12/2021   Dyslipidemia 10/25/2021   Type 2 diabetes mellitus with hyperglycemia ([place]) 04/10/2021   Abnormal weight gain 07/04/2020   Epigastric pain 07/04/2020   Pruritus ani 07/04/2020   S/P total knee replacement using cement, left 11/20/2016   Osteopenia 02/28/2015   Left lumbar radiculitis 02/28/2015   Acute blood loss anemia 09/20/2014   Primary osteoarthritis of right knee 09/14/2014   Osteoarthritis of left knee 09/14/2014   Vitamin B 12 deficiency 07/29/2014   Obesity (BMI 30-39.9) 12/08/2013   Osteoarthritis, knee 33/29/5188   Metabolic syndrome 41/66/0630   DVT, HX OF 02/10/2010   Vitamin D deficiency 08/25/2007   Disorder of bone and cartilage 04/08/2007   Hypothyroidism 11/18/2006   ANEMIA-NOS 11/18/2006   Essential hypertension 11/18/2006   GERD 11/18/2006    PCP: Carolann Littler, MD  REFERRING PROVIDER: [name], MD  REFERRING DIAG: [phone] (ICD-10-CM) - Acute left-sided low back pain with left-sided sciatica  Rationale for Evaluation and Treatment Rehabilitation  THERAPY DIAG:  Other low back pain  Cramp and spasm  Muscle weakness (generalized)  ONSET DATE: 1.5 months ago  SUBJECTIVE:                                                                                                                                                                                           SUBJECTIVE STATEMENT: Doing well today. No pain, I have to leave early bc I have to pick up my friend.  PERTINENT HISTORY:  Bil TKAs  PAIN:  Are you having pain? Yes: NPRS scale: 0/10 Pain location: Low back Lt>Rt, Lt LE Pain description: Lt foot burning, aching in low back with standing 15-20 Aggravating factors: standing, sleeping wrong   Relieving factors: sitting on donut pillow, sometimes pain medication  Walking is limited to 1.5 miles (3 miles is goal)  PRECAUTIONS: None  WEIGHT BEARING RESTRICTIONS No  FALLS:  Has patient fallen in last 6 months? Yes. Number of falls 1.  Fell down the steps while looking at watch. No balance deficits overall.   LIVING ENVIRONMENT: Lives with: lives with their family Lives in: House/apartment Stairs: Yes: External from garage.    OCCUPATION: retired   PLOF: Independent  PATIENT GOALS reduce LBP  OBJECTIVE:   DIAGNOSTIC FINDINGS:  From MD note: The last MRI was performed in 2021 through the [facility].  She had multilevel mild to moderate degenerative changes and moderate spinal stenosis at L2-3 secondary to disc bulge and facet arthropathy.    PATIENT SURVEYS:  FOTO 60 (goal is 8)  SCREENING FOR RED FLAGS: Bowel or bladder incontinence: No Spinal tumors: No Cauda equina syndrome: No Compression fracture: No Abdominal aneurysm: No  COGNITION:  Overall cognitive status: Within functional limits for tasks assessed     SENSATION: WFL  MUSCLE LENGTH: Hamstring length is limited by 25% with Lt LE pain reported at end range  POSTURE: rounded shoulders, forward head, and flexed trunk   PALPATION: Tension in bil lumbar paraspinals with pain L3-5 bilaterally, trigger points   LUMBAR ROM:  Full with limited lumbar segmental mobility.  Pain with Lt sidebending and lumbar flexion.   LOWER EXTREMITY ROM:    Hip flexibility limited by 25% in hip flexion and 50% in IR/ER  LOWER EXTREMITY MMT:   Bil hip strength 4/5, knee 4+/5    LUMBAR SPECIAL TESTS:  Straight leg raise test: Negative  GAIT: Distance walked: 50 Assistive device utilized: None Level of assistance: Complete Independence Comments: increased trunk rotation, flexed trunk  TODAY'S TREATMENT   02/19/22: Nustep L5 x 60mn PTA present to discuss progress. Sit to stand holding 10# KB  2x10 Supine clamshell yellow loop with TA  activation 2x10 Supine SLR with TA activation 10x Bil 1.5# added Supine bridge 10x 3 sec hold LE on red ball Dying bug holding 2# weight 10x Sidelying abd 1#10x Bil  V C for core  02/12/22: Nustep L5 x 4mn PT present to discuss progress. Seated hamstring stretch: 20 seconds x 3 each Sit to stand holding 5# KB 2x10 Supine clamshell blue loop with TA activation 2x10 Seated hamstring stretch 3x20 seconds  Supine SLR with TA activation 10x Bil Supine bridge 10x 3 sec hold Dying bug holding 1# weight 10x Sidelying abd 0#10x Bil  V C for core 02/07/22: Nustep L5 x 843m PT present to discuss progress. Sit to stand holding 5# KB 2x10 Seated clamshell blue loop 2x10 Seated hamstring stretch 3x20 seconds  Supine SLR with TA activation 10x Bil Supine bridge 10x 3 sec hold Dying bug holding 1# weight 10x Sidelying abd 0#10x Bil  V C for core  Trigger Point Dry-Needling  Treatment instructions: Expect mild to moderate muscle soreness. S/S of pneumothorax if dry needled over a lung field, and to seek immediate medical attention should they occur. Patient verbalized understanding of these instructions and education.  Patient Consent Given: Yes Education handout provided: Previously provided Muscles treated: bil gluteals and bil lumbar multifidi Treatment response/outcome: Utilized skilled palpation to identify trigger points.  During dry needling able to palpate muscle twitch and muscle elongation  Elongation to bil lumbar spine and gluteals  Skilled palpation and monitoring by PT during dry needling  PATIENT EDUCATION:  Education details: Access Code: [email or web address], DN info Person educated: Patient Education method: Explanation, Demonstration, and Handouts Education comprehension: verbalized understanding and returned demonstration   HOME EXERCISE PROGRAM: Access Code: DPKear[facility] Services HospitalRL: [email or web address] Date: 02/07/2022 Prepared by:  KeClaiborne BillingsExercises - Supine Lower Trunk Rotation  - 3 x daily - 7 x weekly - 1 sets - 3 reps - 20 hold - Hooklying Single Knee to Chest  - 3 x daily - 7 x weekly - 1 sets - 3 reps - 20 hold - Seated Hamstring Stretch  - 3 x daily - 7 x weekly - 1 sets - 3 reps - 20 hold - Seated Piriformis Stretch with Trunk Bend  - 3 x daily - 7 x weekly - 1 sets - 3 reps - 20 hold - Clamshell  - 1 x daily - 7 x weekly - 2 sets - 10 reps - Sit to Stand Without Arm Support  - 1 x daily - 7 x weekly - 2 sets - 10 reps - Supine Hip Adduction Isometric with Ball  - 1 x daily - 7 x weekly - 2 sets - 10 reps - 5 hold - Supine Active Straight Leg Raise  - 1 x daily - 7 x weekly - 1 sets - 10 reps - Sidelying Hip Abduction  - 1 x daily - 7 x weekly - 1 sets - 10 reps - Supine Bridge  - 1 x daily - 7 x weekly - 1 sets - 10 reps ASSESSMENT:  CLINICAL IMPRESSION: Pt arrives to PT pain free [facility] Pt had to leave early to pick up a friend. PTA added resistance to all her strengthening exercises which pt tolerated well.   OBJECTIVE IMPAIRMENTS decreased activity tolerance, difficulty walking, decreased ROM, decreased strength, increased muscle spasms, impaired flexibility, postural dysfunction, and pain.   ACTIVITY LIMITATIONS sitting, standing, squatting, and locomotion level  PARTICIPATION LIMITATIONS: meal prep, cleaning, laundry, shopping, and community  activity  PERSONAL FACTORS Age and 1-2 comorbidities: 2 TKAs, chronic LBP  are also affecting patient's functional outcome.   REHAB POTENTIAL: Good  CLINICAL DECISION MAKING: Stable/uncomplicated  EVALUATION COMPLEXITY: Low   GOALS: Goals reviewed with patient? Yes  SHORT TERM GOALS: Target date: 02/07/2022  Be independent in initial HEP Baseline: Goal status: MET  2.  Report > or = to 30% reduction in LBP with standing and sitting  Baseline: 25-30% (02/07/22) Goal status: In progress   3.  Walk > or = to 2 miles  for exercise without limitation due to LBP Baseline: 2.30 yesterday Goal status: met  LONG TERM GOALS: Target date: 03/07/2022  Be independent in advanced HEP Baseline:  Goal status: INITIAL  2.  Reduce FOTO to < or = to 71 Baseline: 60 Goal status: INITIAL  3.  Verbalize and demonstrate body mechanics modifications for lumbar protection with ADLs and self-care Baseline:  Goal status: INITIAL  4.  Report > or = to 70% reduction in LBP with standing and sitting  Baseline: 25-30% (02/07/22) Goal status: In progress   5.  Return to regular walking for exercise up to 3 miles without limitation due to LBP Baseline:  Goal status: INITIAL    PLAN: PT FREQUENCY: 1-2x/week  PT DURATION: 8 weeks  PLANNED INTERVENTIONS: Therapeutic exercises, Therapeutic activity, Neuromuscular re-education, Balance training, Gait training, Patient/Family education, Self Care, Joint mobilization, Stair training, Aquatic Therapy, Dry Needling, Electrical stimulation, Cryotherapy, Moist heat, Taping, and Manual therapy.  PLAN FOR NEXT SESSION: DN next session  Myrene Galas, PTA 02/19/22 11:31 AM    [facility] [address] [place], [place] [address] Phone # [phone] Fax [phone]

## 2022-02-21 ENCOUNTER — Ambulatory Visit: Payer: Medicare Other

## 2022-02-21 DIAGNOSIS — R252 Cramp and spasm: Secondary | ICD-10-CM

## 2022-02-21 DIAGNOSIS — M6281 Muscle weakness (generalized): Secondary | ICD-10-CM

## 2022-02-21 DIAGNOSIS — M5459 Other low back pain: Secondary | ICD-10-CM | POA: Diagnosis not present

## 2022-02-21 NOTE — Therapy (Signed)
OUTPATIENT PHYSICAL THERAPY TREATMENT      Patient Name: Kimberly [name] MRN: [id number] DOB:1951-01-22, 71 y.o., female Today's Date: 02/21/2022   PT End of Session - 02/21/22 1142     Visit Number 8    Date for PT Re-Evaluation 03/07/22    Authorization Type Medicare B- KX at 15    Progress Note Due on Visit 10    PT Start Time 1103    PT Stop Time 1143    PT Time Calculation (min) 40 min    Activity Tolerance Patient tolerated treatment well    Behavior During Therapy [facility] for tasks assessed/performed                    Past Medical History:  Diagnosis Date   DDD (degenerative disc disease), lumbar    GERD (gastroesophageal reflux disease)    History of anemia    History of COVID-19    11-11-2020 positive result in care everywhere   History of DVT (deep vein thrombosis) 2003   per pt completed coumadin x 6 mon, never a blood clot prior to 2003 and none since   Hypertension    followed by pcp   (nuclear stress test in epic 05-13-2019, Low risk without ischemia, normal LV and wall motion , ef 68%)   Hypothyroidism    followed by pcp   Lattice degeneration of right retina    OA (osteoarthritis)    Osteopenia    PMB (postmenopausal bleeding)    Pre-diabetes    Vegetarian diet    eats fish and eggs; no meat   Past Surgical History:  Procedure Laterality Date   CATARACT EXTRACTION W/ INTRAOCULAR LENS IMPLANT Bilateral 07/2020   COLONOSCOPY  2016   DILATATION & CURETTAGE/HYSTEROSCOPY WITH MYOSURE N/A 03/27/2021   Procedure: DILATATION & CURETTAGE/HYSTEROSCOPY/Polypectomy WITH MYOSURE;  Surgeon: Azucena Fallen, MD;  Location: [facility];  Service: Gynecology;  Laterality: N/A;   KNEE ARTHROSCOPY Right 03/16/2003   OOPHORECTOMY Left 1999   benign cyst per pt   TOTAL KNEE ARTHROPLASTY Right 09/14/2014   Procedure: RIGHT TOTAL KNEE ARTHROPLASTY;  Surgeon: Garald Balding, MD;  Location: [place];  Service: Orthopedics;  Laterality: Right;   TOTAL  KNEE ARTHROPLASTY Left 11/20/2016   Procedure: LEFT TOTAL KNEE ARTHROPLASTY;  Surgeon: Garald Balding, MD;  Location: [place];  Service: Orthopedics;  Laterality: Left;   Patient Active Problem List   Diagnosis Date Noted   Low back pain 12/12/2021   Dyslipidemia 10/25/2021   Type 2 diabetes mellitus with hyperglycemia ([place]) 04/10/2021   Abnormal weight gain 07/04/2020   Epigastric pain 07/04/2020   Pruritus ani 07/04/2020   S/P total knee replacement using cement, left 11/20/2016   Osteopenia 02/28/2015   Left lumbar radiculitis 02/28/2015   Acute blood loss anemia 09/20/2014   Primary osteoarthritis of right knee 09/14/2014   Osteoarthritis of left knee 09/14/2014   Vitamin B 12 deficiency 07/29/2014   Obesity (BMI 30-39.9) 12/08/2013   Osteoarthritis, knee 17/00/1749   Metabolic syndrome 44/96/7591   DVT, HX OF 02/10/2010   Vitamin D deficiency 08/25/2007   Disorder of bone and cartilage 04/08/2007   Hypothyroidism 11/18/2006   ANEMIA-NOS 11/18/2006   Essential hypertension 11/18/2006   GERD 11/18/2006    PCP: Carolann Littler, MD  REFERRING PROVIDER: [name], MD  REFERRING DIAG: [phone] (ICD-10-CM) - Acute left-sided low back pain with left-sided sciatica  Rationale for Evaluation and Treatment Rehabilitation  THERAPY DIAG:  Other low back pain  Cramp and spasm  Muscle weakness (generalized)  ONSET DATE: 1.5 months ago  SUBJECTIVE:                                                                                                                                                                                           SUBJECTIVE STATEMENT: 35% overall improvement since the start of care.  The needling is helping my foot pain.   PERTINENT HISTORY:  Bil TKAs  PAIN:  Are you having pain? Yes: NPRS scale: 0/10 Pain location: Low back Lt>Rt, Lt LE Pain description: Lt foot burning, aching in low back with standing 15-20 Aggravating factors: standing,  sleeping wrong  Relieving factors: sitting on donut pillow, sometimes pain medication  Walking is limited to 1.5 miles (3 miles is goal)  PRECAUTIONS: None  WEIGHT BEARING RESTRICTIONS No  FALLS:  Has patient fallen in last 6 months? Yes. Number of falls 1.  Fell down the steps while looking at watch. No balance deficits overall.   LIVING ENVIRONMENT: Lives with: lives with their family Lives in: House/apartment Stairs: Yes: External from garage.    OCCUPATION: retired   PLOF: Independent  PATIENT GOALS reduce LBP  OBJECTIVE:   DIAGNOSTIC FINDINGS:  From MD note: The last MRI was performed in 2021 through the [facility].  She had multilevel mild to moderate degenerative changes and moderate spinal stenosis at L2-3 secondary to disc bulge and facet arthropathy.    PATIENT SURVEYS:  FOTO 60 (goal is 24)  SCREENING FOR RED FLAGS: Bowel or bladder incontinence: No Spinal tumors: No Cauda equina syndrome: No Compression fracture: No Abdominal aneurysm: No  COGNITION:  Overall cognitive status: Within functional limits for tasks assessed     SENSATION: WFL  MUSCLE LENGTH: Hamstring length is limited by 25% with Lt LE pain reported at end range  POSTURE: rounded shoulders, forward head, and flexed trunk   PALPATION: Tension in bil lumbar paraspinals with pain L3-5 bilaterally, trigger points   LUMBAR ROM:  Full with limited lumbar segmental mobility.  Pain with Lt sidebending and lumbar flexion.   LOWER EXTREMITY ROM:    Hip flexibility limited by 25% in hip flexion and 50% in IR/ER  LOWER EXTREMITY MMT:   Bil hip strength 4/5, knee 4+/5    LUMBAR SPECIAL TESTS:  Straight leg raise test: Negative  GAIT: Distance walked: 50 Assistive device utilized: None Level of assistance: Complete Independence Comments: increased trunk rotation, flexed trunk  TODAY'S TREATMENT  02/21/22: Nustep L5 x 12mn PT present to discuss progress. Sit to stand  holding 10# KB 2x10 Supine SLR with TA activation 10x Bil 1.5#  added Sidelying abd 1.5 #10x Bil  V C for core Low trunk rotation 3x20 seconds   Trigger Point Dry-Needling  Treatment instructions: Expect mild to moderate muscle soreness. S/S of pneumothorax if dry needled over a lung field, and to seek immediate medical attention should they occur. Patient verbalized understanding of these instructions and education.  Patient Consent Given: Yes Education handout provided: Previously provided Muscles treated: bil gluteals and bil lumbar multifidi Treatment response/outcome: Utilized skilled palpation to identify trigger points.  During dry needling able to palpate muscle twitch and muscle elongation  Elongation to bil lumbar spine and gluteals  Skilled palpation and monitoring by PT during dry needling   02/19/22: Nustep L5 x 56mn PTA present to discuss progress. Sit to stand holding 10# KB 2x10 Supine clamshell yellow loop with TA activation 2x10 Supine SLR with TA activation 10x Bil 1.5# added Supine bridge 10x 3 sec hold LE on red ball Dying bug holding 2# weight 10x Sidelying abd 1#10x Bil  V C for core  02/12/22: Nustep L5 x 85m PT present to discuss progress. Seated hamstring stretch: 20 seconds x 3 each Sit to stand holding 5# KB 2x10 Supine clamshell blue loop with TA activation 2x10 Seated hamstring stretch 3x20 seconds  Supine SLR with TA activation 10x Bil Supine bridge 10x 3 sec hold Dying bug holding 1# weight 10x Sidelying abd 0#10x Bil  V C for core  Trigger Point Dry-Needling  Treatment instructions: Expect mild to moderate muscle soreness. S/S of pneumothorax if dry needled over a lung field, and to seek immediate medical attention should they occur. Patient verbalized understanding of these instructions and education.  Patient Consent Given: Yes Education handout provided: Previously provided Muscles treated: bil gluteals and bil lumbar multifidi Treatment  response/outcome: Utilized skilled palpation to identify trigger points.  During dry needling able to palpate muscle twitch and muscle elongation  Elongation to bil lumbar spine and gluteals  Skilled palpation and monitoring by PT during dry needling  PATIENT EDUCATION:  Education details: Access Code: [email or web address], DN info Person educated: Patient Education method: Explanation, Demonstration, and Handouts Education comprehension: verbalized understanding and returned demonstration   HOME EXERCISE PROGRAM: Access Code: [facility]PGre[facility] HospitalRL: [email or web address] Date: 02/07/2022 Prepared by: KeClaiborne BillingsExercises - Supine Lower Trunk Rotation  - 3 x daily - 7 x weekly - 1 sets - 3 reps - 20 hold - Hooklying Single Knee to Chest  - 3 x daily - 7 x weekly - 1 sets - 3 reps - 20 hold - Seated Hamstring Stretch  - 3 x daily - 7 x weekly - 1 sets - 3 reps - 20 hold - Seated Piriformis Stretch with Trunk Bend  - 3 x daily - 7 x weekly - 1 sets - 3 reps - 20 hold - Clamshell  - 1 x daily - 7 x weekly - 2 sets - 10 reps - Sit to Stand Without Arm Support  - 1 x daily - 7 x weekly - 2 sets - 10 reps - Supine Hip Adduction Isometric with Ball  - 1 x daily - 7 x weekly - 2 sets - 10 reps - 5 hold - Supine Active Straight Leg Raise  - 1 x daily - 7 x weekly - 1 sets - 10 reps - Sidelying Hip Abduction  - 1 x daily - 7 x weekly - 1 sets - 10 reps - Supine Bridge  - 1 x daily - 7 x weekly - 1 sets - 10  reps ASSESSMENT:  CLINICAL IMPRESSION: Pt has been exercising at the [facility] and reports 35% overall improvement since the start of care.  Pt is doing well with advancement of strength in the clinic.  Overall improved tissue mobility and reduced tension since the start of care.  Pt with tension and mild trigger points in the bil lumbar spine and gluteals and had good response to DN and manual therapy today.  Patient will benefit from skilled PT to address the below impairments and improve overall function.    OBJECTIVE IMPAIRMENTS decreased activity tolerance, difficulty walking, decreased ROM, decreased strength, increased muscle spasms, impaired flexibility, postural dysfunction, and pain.   ACTIVITY LIMITATIONS sitting, standing, squatting, and locomotion level  PARTICIPATION LIMITATIONS: meal prep, cleaning, laundry, shopping, and community activity  PERSONAL FACTORS Age and 1-2 comorbidities: 2 TKAs, chronic LBP  are also affecting patient's functional outcome.   REHAB POTENTIAL: Good  CLINICAL DECISION MAKING: Stable/uncomplicated  EVALUATION COMPLEXITY: Low   GOALS: Goals reviewed with patient? Yes  SHORT TERM GOALS: Target date: 02/07/2022  Be independent in initial HEP Baseline: Goal status: MET  2.  Report > or = to 30% reduction in LBP with standing and sitting  Baseline: 25-30% (02/07/22) Goal status: In progress   3.  Walk > or = to 2 miles for exercise without limitation due to LBP Baseline: 2.30 yesterday Goal status: met  LONG TERM GOALS: Target date: 03/07/2022  Be independent in advanced HEP Baseline:  Goal status: INITIAL  2.  Reduce FOTO to < or = to 71 Baseline: 60 Goal status: INITIAL  3.  Verbalize and demonstrate body mechanics modifications for lumbar protection with ADLs and self-care Baseline:  Goal status: INITIAL  4.  Report > or = to 70% reduction in LBP with standing and sitting  Baseline: 35% (02/21/22) Goal status: In progress   5.  Return to regular walking for exercise up to 3 miles without limitation due to LBP Baseline:  Goal status: INITIAL    PLAN: PT FREQUENCY: 1-2x/week  PT DURATION: 8 weeks  PLANNED INTERVENTIONS: Therapeutic exercises, Therapeutic activity, Neuromuscular re-education, Balance training, Gait training, Patient/Family education, Self Care, Joint mobilization, Stair training, Aquatic Therapy, Dry Needling, Electrical stimulation, Cryotherapy, Moist heat, Taping, and Manual therapy.  PLAN FOR NEXT  SESSION: aquatics, strength, manual therapy for pain  Sigurd Sos, PT 02/21/22 11:43 AM   [facility] [address] [place], [place] [address] Phone # [phone] Fax [phone]

## 2022-02-26 ENCOUNTER — Encounter: Payer: Self-pay | Admitting: Physical Therapy

## 2022-02-26 ENCOUNTER — Ambulatory Visit: Payer: Medicare Other | Admitting: Physical Therapy

## 2022-02-26 DIAGNOSIS — M5459 Other low back pain: Secondary | ICD-10-CM

## 2022-02-26 DIAGNOSIS — M6281 Muscle weakness (generalized): Secondary | ICD-10-CM | POA: Diagnosis not present

## 2022-02-26 DIAGNOSIS — R252 Cramp and spasm: Secondary | ICD-10-CM | POA: Diagnosis not present

## 2022-02-26 NOTE — Therapy (Signed)
OUTPATIENT PHYSICAL THERAPY TREATMENT      Patient Name: Kimberly [name] MRN: [id number] DOB:[birth year], 72 y.o., female Today's Date: 02/26/2022   PT End of Session - 02/26/22 1109     Visit Number 9    Date for PT Re-Evaluation 03/07/22    Authorization Type Medicare B- KX at 15    Progress Note Due on Visit 10    PT Start Time 1102    PT Stop Time 1140    PT Time Calculation (min) 38 min    Activity Tolerance --    Behavior During Therapy WFL for tasks assessed/performed                    Past Medical History:  Diagnosis Date   DDD (degenerative disc disease), lumbar    GERD (gastroesophageal reflux disease)    History of anemia    History of COVID-19    11-11-2020 positive result in care everywhere   History of DVT (deep vein thrombosis) 2003   per pt completed coumadin x 6 mon, never a blood clot prior to 2003 and none since   Hypertension    followed by pcp   (nuclear stress test in epic 05-13-2019, Low risk without ischemia, normal LV and wall motion , ef 68%)   Hypothyroidism    followed by pcp   Lattice degeneration of right retina    OA (osteoarthritis)    Osteopenia    PMB (postmenopausal bleeding)    Pre-diabetes    Vegetarian diet    eats fish and eggs; no meat   Past Surgical History:  Procedure Laterality Date   CATARACT EXTRACTION W/ INTRAOCULAR LENS IMPLANT Bilateral 07/2020   COLONOSCOPY  2016   DILATATION & CURETTAGE/HYSTEROSCOPY WITH MYOSURE N/A 03/27/2021   Procedure: DILATATION & CURETTAGE/HYSTEROSCOPY/Polypectomy WITH MYOSURE;  Surgeon: Azucena Fallen, MD;  Location: [facility];  Service: Gynecology;  Laterality: N/A;   KNEE ARTHROSCOPY Right 03/16/2003   OOPHORECTOMY Left 1999   benign cyst per pt   TOTAL KNEE ARTHROPLASTY Right 09/14/2014   Procedure: RIGHT TOTAL KNEE ARTHROPLASTY;  Surgeon: Garald Balding, MD;  Location: [place];  Service: Orthopedics;  Laterality: Right;   TOTAL KNEE ARTHROPLASTY Left 11/20/2016    Procedure: LEFT TOTAL KNEE ARTHROPLASTY;  Surgeon: Garald Balding, MD;  Location: [place];  Service: Orthopedics;  Laterality: Left;   Patient Active Problem List   Diagnosis Date Noted   Low back pain 12/12/2021   Dyslipidemia 10/25/2021   Type 2 diabetes mellitus with hyperglycemia ([place]) 04/10/2021   Abnormal weight gain 07/04/2020   Epigastric pain 07/04/2020   Pruritus ani 07/04/2020   S/P total knee replacement using cement, left 11/20/2016   Osteopenia 02/28/2015   Left lumbar radiculitis 02/28/2015   Acute blood loss anemia 09/20/2014   Primary osteoarthritis of right knee 09/14/2014   Osteoarthritis of left knee 09/14/2014   Vitamin B 12 deficiency 07/29/2014   Obesity (BMI 30-39.9) 12/08/2013   Osteoarthritis, knee 28/78/6767   Metabolic syndrome 20/94/7096   DVT, HX OF 02/10/2010   Vitamin D deficiency 08/25/2007   Disorder of bone and cartilage 04/08/2007   Hypothyroidism 11/18/2006   ANEMIA-NOS 11/18/2006   Essential hypertension 11/18/2006   GERD 11/18/2006    PCP: Carolann Littler, MD  REFERRING PROVIDER: [name], MD  REFERRING DIAG: [phone] (ICD-10-CM) - Acute left-sided low back pain with left-sided sciatica  Rationale for Evaluation and Treatment Rehabilitation  THERAPY DIAG:  Other low back pain  Cramp and  spasm  Muscle weakness (generalized)  ONSET DATE: 1.5 months ago  SUBJECTIVE:                                                                                                                                                                                           SUBJECTIVE STATEMENT:  DN really helped my foot.  PERTINENT HISTORY:  Bil TKAs  PAIN:  Are you having pain? Not right now: NPRS scale: 0/10 Pain location: Pain description: Lt foot burning, aching in low back with standing 15-20 Aggravating factors: standing, sleeping wrong  Relieving factors: sitting on donut pillow, sometimes pain medication  Walking is limited  to 1.5 miles (3 miles is goal)  PRECAUTIONS: None  WEIGHT BEARING RESTRICTIONS No  FALLS:  Has patient fallen in last 6 months? Yes. Number of falls 1.  Fell down the steps while looking at watch. No balance deficits overall.   LIVING ENVIRONMENT: Lives with: lives with their family Lives in: House/apartment Stairs: Yes: External from garage.    OCCUPATION: retired   PLOF: Independent  PATIENT GOALS reduce LBP  OBJECTIVE:   DIAGNOSTIC FINDINGS:  From MD note: The last MRI was performed in 2021 through the [facility].  She had multilevel mild to moderate degenerative changes and moderate spinal stenosis at L2-3 secondary to disc bulge and facet arthropathy.    PATIENT SURVEYS:  FOTO 60 (goal is 39)  SCREENING FOR RED FLAGS: Bowel or bladder incontinence: No Spinal tumors: No Cauda equina syndrome: No Compression fracture: No Abdominal aneurysm: No  COGNITION:  Overall cognitive status: Within functional limits for tasks assessed     SENSATION: WFL  MUSCLE LENGTH: Hamstring length is limited by 25% with Lt LE pain reported at end range  POSTURE: rounded shoulders, forward head, and flexed trunk   PALPATION: Tension in bil lumbar paraspinals with pain L3-5 bilaterally, trigger points   LUMBAR ROM:  Full with limited lumbar segmental mobility.  Pain with Lt sidebending and lumbar flexion.   LOWER EXTREMITY ROM:    Hip flexibility limited by 25% in hip flexion and 50% in IR/ER  LOWER EXTREMITY MMT:   Bil hip strength 4/5, knee 4+/5    LUMBAR SPECIAL TESTS:  Straight leg raise test: Negative  GAIT: Distance walked: 50 Assistive device utilized: None Level of assistance: Complete Independence Comments: increased trunk rotation, flexed trunk  TODAY'S TREATMENT   02/26/22: Nustep L5 x 56mn PTA present to discuss progress. Sit to stand holding 10# KB 3x10 Red band supine clamshell 3x10 with TA contracting Supine SLR 2# with core activation  10x Bil Sidelying hip abduction 2# 10x  Supine  bridge with ball squeeze 2x10 Lower trunk rotation after bridge  10x 6' step 1UE for support 10x2 bil  02/21/22: Nustep L5 x 76mn PT present to discuss progress. Sit to stand holding 10# KB 2x10 Supine SLR with TA activation 10x Bil 1.5# added Sidelying abd 1.5 #10x Bil  V C for core Low trunk rotation 3x20 seconds   Trigger Point Dry-Needling  Treatment instructions: Expect mild to moderate muscle soreness. S/S of pneumothorax if dry needled over a lung field, and to seek immediate medical attention should they occur. Patient verbalized understanding of these instructions and education.  Patient Consent Given: Yes Education handout provided: Previously provided Muscles treated: bil gluteals and bil lumbar multifidi Treatment response/outcome: Utilized skilled palpation to identify trigger points.  During dry needling able to palpate muscle twitch and muscle elongation  Elongation to bil lumbar spine and gluteals  Skilled palpation and monitoring by PT during dry needling   02/19/22: Nustep L5 x 119m PTA present to discuss progress. Sit to stand holding 10# KB 2x10 Supine clamshell yellow loop with TA activation 2x10 Supine SLR with TA activation 10x Bil 1.5# added Supine bridge 10x 3 sec hold LE on red ball Dying bug holding 2# weight 10x Sidelying abd 1#10x Bil  V C for core  PATIENT EDUCATION:  Education details: Access Code: [email or web address], DN info Person educated: Patient Education method: Explanation, Demonstration, and Handouts Education comprehension: verbalized understanding and returned demonstration   HOME EXERCISE PROGRAM: Access Code: [facility]PRhea Medical CenterRL: [email or web address] Date: 02/07/2022 Prepared by: KeClaiborne BillingsExercises - Supine Lower Trunk Rotation  - 3 x daily - 7 x weekly - 1 sets - 3 reps - 20 hold - Hooklying Single Knee to Chest  - 3 x daily - 7 x weekly - 1 sets - 3 reps - 20 hold - Seated Hamstring  Stretch  - 3 x daily - 7 x weekly - 1 sets - 3 reps - 20 hold - Seated Piriformis Stretch with Trunk Bend  - 3 x daily - 7 x weekly - 1 sets - 3 reps - 20 hold - Clamshell  - 1 x daily - 7 x weekly - 2 sets - 10 reps - Sit to Stand Without Arm Support  - 1 x daily - 7 x weekly - 2 sets - 10 reps - Supine Hip Adduction Isometric with Ball  - 1 x daily - 7 x weekly - 2 sets - 10 reps - 5 hold - Supine Active Straight Leg Raise  - 1 x daily - 7 x weekly - 1 sets - 10 reps - Sidelying Hip Abduction  - 1 x daily - 7 x weekly - 1 sets - 10 reps - Supine Bridge  - 1 x daily - 7 x weekly - 1 sets - 10 reps ASSESSMENT:  CLINICAL IMPRESSION: Pt arrives to PT with no pain. Pt reports the dry needling is very helpful especially to her foot. Pt has no increased pain while performing her core and hip strengthening exercises.    OBJECTIVE IMPAIRMENTS decreased activity tolerance, difficulty walking, decreased ROM, decreased strength, increased muscle spasms, impaired flexibility, postural dysfunction, and pain.   ACTIVITY LIMITATIONS sitting, standing, squatting, and locomotion level  PARTICIPATION LIMITATIONS: meal prep, cleaning, laundry, shopping, and community activity  PERSONAL FACTORS Age and 1-2 comorbidities: 2 TKAs, chronic LBP  are also affecting patient's functional outcome.   REHAB POTENTIAL: Good  CLINICAL DECISION MAKING: Stable/uncomplicated  EVALUATION COMPLEXITY: Low   GOALS: Goals reviewed with patient?  Yes  SHORT TERM GOALS: Target date: 02/07/2022  Be independent in initial HEP Baseline: Goal status: MET  2.  Report > or = to 30% reduction in LBP with standing and sitting  Baseline: 25-30% (02/07/22) Goal status: In progress   3.  Walk > or = to 2 miles for exercise without limitation due to LBP Baseline: 2.30 yesterday Goal status: met  LONG TERM GOALS: Target date: 03/07/2022  Be independent in advanced HEP Baseline:  Goal status: INITIAL  2.  Reduce FOTO to <  or = to 71 Baseline: 60 Goal status: INITIAL  3.  Verbalize and demonstrate body mechanics modifications for lumbar protection with ADLs and self-care Baseline:  Goal status: INITIAL  4.  Report > or = to 70% reduction in LBP with standing and sitting  Baseline: 35% (02/21/22) Goal status: In progress   5.  Return to regular walking for exercise up to 3 miles without limitation due to LBP Baseline:  Goal status: INITIAL    PLAN: PT FREQUENCY: 1-2x/week  PT DURATION: 8 weeks  PLANNED INTERVENTIONS: Therapeutic exercises, Therapeutic activity, Neuromuscular re-education, Balance training, Gait training, Patient/Family education, Self Care, Joint mobilization, Stair training, Aquatic Therapy, Dry Needling, Electrical stimulation, Cryotherapy, Moist heat, Taping, and Manual therapy.  PLAN FOR NEXT SESSION: aquatics, strength, manual therapy for pain  Myrene Galas, PTA 02/26/22 11:39 AM    [facility] [address] [place], [place] [address] Phone # [phone] Fax [phone]

## 2022-02-28 ENCOUNTER — Ambulatory Visit: Payer: Medicare Other | Admitting: Physical Therapy

## 2022-02-28 ENCOUNTER — Encounter: Payer: Self-pay | Admitting: Physical Therapy

## 2022-02-28 DIAGNOSIS — M6281 Muscle weakness (generalized): Secondary | ICD-10-CM

## 2022-02-28 DIAGNOSIS — M5459 Other low back pain: Secondary | ICD-10-CM | POA: Diagnosis not present

## 2022-02-28 DIAGNOSIS — R252 Cramp and spasm: Secondary | ICD-10-CM | POA: Diagnosis not present

## 2022-02-28 NOTE — Therapy (Addendum)
OUTPATIENT PHYSICAL THERAPY TREATMENT      Patient Name: Kimberly [name] MRN: [id number] DOB:10-Apr-1951, 71 y.o., female Today's Date: 02/28/2022  Progress Note Reporting Period 01/10/22 to 02/28/22  See note below for Objective Data and Assessment of Progress/Goals.    Sigurd Sos, PT 03/01/22 7:34 AM    PT End of Session - 02/28/22 0952     Visit Number 10    Date for PT Re-Evaluation 03/07/22    Authorization Type Medicare B- KX at 15    Progress Note Due on Visit 10    PT Start Time 0847    PT Stop Time 0935    PT Time Calculation (min) 48 min    Activity Tolerance Patient tolerated treatment well    Behavior During Therapy [facility] for tasks assessed/performed                     Past Medical History:  Diagnosis Date   DDD (degenerative disc disease), lumbar    GERD (gastroesophageal reflux disease)    History of anemia    History of COVID-19    11-11-2020 positive result in care everywhere   History of DVT (deep vein thrombosis) 2003   per pt completed coumadin x 6 mon, never a blood clot prior to 2003 and none since   Hypertension    followed by pcp   (nuclear stress test in epic 05-13-2019, Low risk without ischemia, normal LV and wall motion , ef 68%)   Hypothyroidism    followed by pcp   Lattice degeneration of right retina    OA (osteoarthritis)    Osteopenia    PMB (postmenopausal bleeding)    Pre-diabetes    Vegetarian diet    eats fish and eggs; no meat   Past Surgical History:  Procedure Laterality Date   CATARACT EXTRACTION W/ INTRAOCULAR LENS IMPLANT Bilateral 07/2020   COLONOSCOPY  2016   DILATATION & CURETTAGE/HYSTEROSCOPY WITH MYOSURE N/A 03/27/2021   Procedure: DILATATION & CURETTAGE/HYSTEROSCOPY/Polypectomy WITH MYOSURE;  Surgeon: Azucena Fallen, MD;  Location: [facility];  Service: Gynecology;  Laterality: N/A;   KNEE ARTHROSCOPY Right 03/16/2003   OOPHORECTOMY Left 1999   benign cyst per pt   TOTAL KNEE  ARTHROPLASTY Right 09/14/2014   Procedure: RIGHT TOTAL KNEE ARTHROPLASTY;  Surgeon: Garald Balding, MD;  Location: [place];  Service: Orthopedics;  Laterality: Right;   TOTAL KNEE ARTHROPLASTY Left 11/20/2016   Procedure: LEFT TOTAL KNEE ARTHROPLASTY;  Surgeon: Garald Balding, MD;  Location: [place];  Service: Orthopedics;  Laterality: Left;   Patient Active Problem List   Diagnosis Date Noted   Low back pain 12/12/2021   Dyslipidemia 10/25/2021   Type 2 diabetes mellitus with hyperglycemia ([place]) 04/10/2021   Abnormal weight gain 07/04/2020   Epigastric pain 07/04/2020   Pruritus ani 07/04/2020   S/P total knee replacement using cement, left 11/20/2016   Osteopenia 02/28/2015   Left lumbar radiculitis 02/28/2015   Acute blood loss anemia 09/20/2014   Primary osteoarthritis of right knee 09/14/2014   Osteoarthritis of left knee 09/14/2014   Vitamin B 12 deficiency 07/29/2014   Obesity (BMI 30-39.9) 12/08/2013   Osteoarthritis, knee 97/35/3299   Metabolic syndrome 24/26/8341   DVT, HX OF 02/10/2010   Vitamin D deficiency 08/25/2007   Disorder of bone and cartilage 04/08/2007   Hypothyroidism 11/18/2006   ANEMIA-NOS 11/18/2006   Essential hypertension 11/18/2006   GERD 11/18/2006    PCP: Carolann Littler, MD  REFERRING PROVIDER: [name],  MD  REFERRING DIAG: M54.42 (ICD-10-CM) - Acute left-sided low back pain with left-sided sciatica  Rationale for Evaluation and Treatment Rehabilitation  THERAPY DIAG:  Other low back pain  Cramp and spasm  Muscle weakness (generalized)  ONSET DATE: 1.5 months ago  SUBJECTIVE:                                                                                                                                                                                           SUBJECTIVE STATEMENT: I am doing so good. 75% better, my back is good. Some times my foot still acts up but even that is better especially when I get the  needling.    PERTINENT HISTORY:  Bil TKAs  PAIN:  Are you having pain? Not right now: NPRS scale: 0/10 Pain location: Pain description: Lt foot burning, aching in low back with standing 15-20 Aggravating factors: standing, sleeping wrong  Relieving factors: sitting on donut pillow, sometimes pain medication  Walking is limited to 1.5 miles (3 miles is goal)  PRECAUTIONS: None  WEIGHT BEARING RESTRICTIONS No  FALLS:  Has patient fallen in last 6 months? Yes. Number of falls 1.  Fell down the steps while looking at watch. No balance deficits overall.   LIVING ENVIRONMENT: Lives with: lives with their family Lives in: House/apartment Stairs: Yes: External from garage.    OCCUPATION: retired   PLOF: Independent  PATIENT GOALS reduce LBP  OBJECTIVE:   DIAGNOSTIC FINDINGS:  From MD note: The last MRI was performed in 2021 through the [facility].  She had multilevel mild to moderate degenerative changes and moderate spinal stenosis at L2-3 secondary to disc bulge and facet arthropathy.    PATIENT SURVEYS:  FOTO 60 (goal is 76)  SCREENING FOR RED FLAGS: Bowel or bladder incontinence: No Spinal tumors: No Cauda equina syndrome: No Compression fracture: No Abdominal aneurysm: No  COGNITION:  Overall cognitive status: Within functional limits for tasks assessed     SENSATION: WFL  MUSCLE LENGTH: Hamstring length is limited by 25% with Lt LE pain reported at end range  POSTURE: rounded shoulders, forward head, and flexed trunk   PALPATION: Tension in bil lumbar paraspinals with pain L3-5 bilaterally, trigger points   LUMBAR ROM:  Full with limited lumbar segmental mobility.  Pain with Lt sidebending and lumbar flexion.   LOWER EXTREMITY ROM:    Hip flexibility limited by 25% in hip flexion and 50% in IR/ER  LOWER EXTREMITY MMT:   Bil hip strength 4/5, knee 4+/5 02/28/22: Bil hip flexors 4+/5, all others 4/5, quads 4+/5: tested in pool    LUMBAR  SPECIAL TESTS:  Straight leg raise test: Negative  GAIT: Distance walked: 50 Assistive device utilized: None Level of assistance: Complete Independence Comments: increased trunk rotation, flexed trunk  TODAY'S TREATMENT  02/28/22: Aquatic  Pt arrives for aquatic physical therapy. Treatment took place in 3.5-5.5 feet of water. Water temperature was 91 degrees F.. Pt entered the pool via stairs reciprocally and mild use of rails. Pt requires buoyancy of water for support and to offload joints with strengthening exercises.    Seated water bench with 75% submersion Pt performed seated LE AROM exercises 20x in all planes,   Standing 75% depth: water walking 6 lengths in each direction holding small noodle for postural support and pt was initially a little nervous so holding on to something decreased her anxiety.Wall exs Bil: 10x hip flex/ext/abd/circumduction. Hold single buoy down under water 5 sec and contract core 5x, then add march 10x. Lat press with blue noodle 2x10 against wall, VC to contract core and glutes.  Tried horesback bicycle but pt could not balance. Switched to noodle behind pt with moderate support from PTA for trunk control 1 min bicycle, 1 min decompression float. 3x. Hamstring stretch on second step holding onto th erails bil 3x 30 sec.  02/26/22: Nustep L5 x 61mn PTA present to discuss progress. Sit to stand holding 10# KB 3x10 Red band supine clamshell 3x10 with TA contracting Supine SLR 2# with core activation 10x Bil Sidelying hip abduction 2# 10x  Supine bridge with ball squeeze 2x10 Lower trunk rotation after bridge  10x 6' step 1UE for support 10x2 bil  02/21/22: Nustep L5 x 846m PT present to discuss progress. Sit to stand holding 10# KB 2x10 Supine SLR with TA activation 10x Bil 1.5# added Sidelying abd 1.5 #10x Bil  V C for core Low trunk rotation 3x20 seconds   Trigger Point Dry-Needling  Treatment instructions: Expect mild to moderate muscle soreness.  S/S of pneumothorax if dry needled over a lung field, and to seek immediate medical attention should they occur. Patient verbalized understanding of these instructions and education.  Patient Consent Given: Yes Education handout provided: Previously provided Muscles treated: bil gluteals and bil lumbar multifidi Treatment response/outcome: Utilized skilled palpation to identify trigger points.  During dry needling able to palpate muscle twitch and muscle elongation  Elongation to bil lumbar spine and gluteals  Skilled palpation and monitoring by PT during dry needling    PATIENT EDUCATION:  Education details: Access Code: [email or web address], DN info Person educated: Patient Education method: Explanation, Demonstration, and Handouts Education comprehension: verbalized understanding and returned demonstration   HOME EXERCISE PROGRAM: Access Code: [facility]St [facility] - SavannahRL: [email or web address] Date: 02/07/2022 Prepared by: KeClaiborne BillingsExercises - Supine Lower Trunk Rotation  - 3 x daily - 7 x weekly - 1 sets - 3 reps - 20 hold - Hooklying Single Knee to Chest  - 3 x daily - 7 x weekly - 1 sets - 3 reps - 20 hold - Seated Hamstring Stretch  - 3 x daily - 7 x weekly - 1 sets - 3 reps - 20 hold - Seated Piriformis Stretch with Trunk Bend  - 3 x daily - 7 x weekly - 1 sets - 3 reps - 20 hold - Clamshell  - 1 x daily - 7 x weekly - 2 sets - 10 reps - Sit to Stand Without Arm Support  - 1 x daily - 7 x weekly - 2 sets - 10 reps - Supine Hip Adduction Isometric with Ball  - 1 x daily -  7 x weekly - 2 sets - 10 reps - 5 hold - Supine Active Straight Leg Raise  - 1 x daily - 7 x weekly - 1 sets - 10 reps - Sidelying Hip Abduction  - 1 x daily - 7 x weekly - 1 sets - 10 reps - Supine Bridge  - 1 x daily - 7 x weekly - 1 sets - 10 reps ASSESSMENT:  CLINICAL IMPRESSION: Pt arrives to PT with no pain for first aquatic Pt treatment. Pt was able to participate in all exercises pain free. Some initial anxiety  of moving in the water bc pt does not swim. Pt could not control horseback due to decreased trunk control but could do seated with mod asst from PTA  OBJECTIVE IMPAIRMENTS decreased activity tolerance, difficulty walking, decreased ROM, decreased strength, increased muscle spasms, impaired flexibility, postural dysfunction, and pain.   ACTIVITY LIMITATIONS sitting, standing, squatting, and locomotion level  PARTICIPATION LIMITATIONS: meal prep, cleaning, laundry, shopping, and community activity  PERSONAL FACTORS Age and 1-2 comorbidities: 2 TKAs, chronic LBP  are also affecting patient's functional outcome.   REHAB POTENTIAL: Good  CLINICAL DECISION MAKING: Stable/uncomplicated  EVALUATION COMPLEXITY: Low   GOALS: Goals reviewed with patient? Yes  SHORT TERM GOALS: Target date: 02/07/2022  Be independent in initial HEP Baseline: Goal status: MET  2.  Report > or = to 30% reduction in LBP with standing and sitting  Baseline: 25-30% (02/07/22) Goal status: Goal met 02/28/22 75%  3.  Walk > or = to 2 miles for exercise without limitation due to LBP Baseline: 2.30 yesterday Goal status: met  LONG TERM GOALS: Target date: 03/07/2022  Be independent in advanced HEP Baseline:  Goal status: On going  2.  Reduce FOTO to < or = to 71 Baseline: 60 Goal status: INITIAL  3.  Verbalize and demonstrate body mechanics modifications for lumbar protection with ADLs and self-care Baseline:  Goal status: INITIAL  4.  Report > or = to 70% reduction in LBP with standing and sitting  Baseline: 35% (02/21/22) Goal status: Met 02/28/22  5.  Return to regular walking for exercise up to 3 miles without limitation due to LBP Baseline:  Goal status: Has returned to the gym but has not gotten back into walking just yet.    PLAN: PT FREQUENCY: 1-2x/week  PT DURATION: 8 weeks  PLANNED INTERVENTIONS: Therapeutic exercises, Therapeutic activity, Neuromuscular re-education, Balance  training, Gait training, Patient/Family education, Self Care, Joint mobilization, Stair training, Aquatic Therapy, Dry Needling, Electrical stimulation, Cryotherapy, Moist heat, Taping, and Manual therapy.  PLAN FOR NEXT SESSION: aquatics, strength, manual therapy for pain  Myrene Galas, PTA 02/28/22 9:54 AM    [facility] [address], [place], [place] [address] Phone # [phone] Fax [phone]

## 2022-03-05 ENCOUNTER — Ambulatory Visit: Payer: Medicare Other | Admitting: Physical Therapy

## 2022-03-05 ENCOUNTER — Encounter: Payer: Self-pay | Admitting: Physical Therapy

## 2022-03-05 DIAGNOSIS — R252 Cramp and spasm: Secondary | ICD-10-CM | POA: Diagnosis not present

## 2022-03-05 DIAGNOSIS — M5459 Other low back pain: Secondary | ICD-10-CM

## 2022-03-05 DIAGNOSIS — M6281 Muscle weakness (generalized): Secondary | ICD-10-CM

## 2022-03-05 NOTE — Therapy (Signed)
OUTPATIENT PHYSICAL THERAPY TREATMENT      Patient Name: Kimberly [name] MRN: [id number] DOB:06-24-1950, 71 y.o., female Today's Date: 03/05/2022     PT End of Session - 03/05/22 1059     Visit Number 11    Date for PT Re-Evaluation 03/07/22    Authorization Type Medicare B- KX at 15    Progress Note Due on Visit 10    PT Start Time 1058    PT Stop Time 1139    PT Time Calculation (min) 41 min    Activity Tolerance Patient tolerated treatment well    Behavior During Therapy WFL for tasks assessed/performed                      Past Medical History:  Diagnosis Date   DDD (degenerative disc disease), lumbar    GERD (gastroesophageal reflux disease)    History of anemia    History of COVID-19    11-11-2020 positive result in care everywhere   History of DVT (deep vein thrombosis) 2003   per pt completed coumadin x 6 mon, never a blood clot prior to 2003 and none since   Hypertension    followed by pcp   (nuclear stress test in epic 05-13-2019, Low risk without ischemia, normal LV and wall motion , ef 68%)   Hypothyroidism    followed by pcp   Lattice degeneration of right retina    OA (osteoarthritis)    Osteopenia    PMB (postmenopausal bleeding)    Pre-diabetes    Vegetarian diet    eats fish and eggs; no meat   Past Surgical History:  Procedure Laterality Date   CATARACT EXTRACTION W/ INTRAOCULAR LENS IMPLANT Bilateral 07/2020   COLONOSCOPY  2016   DILATATION & CURETTAGE/HYSTEROSCOPY WITH MYOSURE N/A 03/27/2021   Procedure: DILATATION & CURETTAGE/HYSTEROSCOPY/Polypectomy WITH MYOSURE;  Surgeon: Azucena Fallen, MD;  Location: [facility];  Service: Gynecology;  Laterality: N/A;   KNEE ARTHROSCOPY Right 03/16/2003   OOPHORECTOMY Left 1999   benign cyst per pt   TOTAL KNEE ARTHROPLASTY Right 09/14/2014   Procedure: RIGHT TOTAL KNEE ARTHROPLASTY;  Surgeon: Garald Balding, MD;  Location: [place];  Service: Orthopedics;  Laterality: Right;    TOTAL KNEE ARTHROPLASTY Left 11/20/2016   Procedure: LEFT TOTAL KNEE ARTHROPLASTY;  Surgeon: Garald Balding, MD;  Location: [place];  Service: Orthopedics;  Laterality: Left;   Patient Active Problem List   Diagnosis Date Noted   Low back pain 12/12/2021   Dyslipidemia 10/25/2021   Type 2 diabetes mellitus with hyperglycemia ([place]) 04/10/2021   Abnormal weight gain 07/04/2020   Epigastric pain 07/04/2020   Pruritus ani 07/04/2020   S/P total knee replacement using cement, left 11/20/2016   Osteopenia 02/28/2015   Left lumbar radiculitis 02/28/2015   Acute blood loss anemia 09/20/2014   Primary osteoarthritis of right knee 09/14/2014   Osteoarthritis of left knee 09/14/2014   Vitamin B 12 deficiency 07/29/2014   Obesity (BMI 30-39.9) 12/08/2013   Osteoarthritis, knee 15/17/6160   Metabolic syndrome 73/71/0626   DVT, HX OF 02/10/2010   Vitamin D deficiency 08/25/2007   Disorder of bone and cartilage 04/08/2007   Hypothyroidism 11/18/2006   ANEMIA-NOS 11/18/2006   Essential hypertension 11/18/2006   GERD 11/18/2006    PCP: Carolann Littler, MD  REFERRING PROVIDER: [name], MD  REFERRING DIAG: [phone] (ICD-10-CM) - Acute left-sided low back pain with left-sided sciatica  Rationale for Evaluation and Treatment Rehabilitation  THERAPY DIAG:  Other low back pain  Cramp and spasm  Muscle weakness (generalized)  ONSET DATE: 1.5 months ago  SUBJECTIVE:                                                                                                                                                                                           SUBJECTIVE STATEMENT: I loved the pool, felt very good after.    PERTINENT HISTORY:  Bil TKAs  PAIN:  Are you having pain? Not right now: NPRS scale: 0/10 Pain location: Pain description: Lt foot burning, aching in low back with standing 15-20 Aggravating factors: standing, sleeping wrong  Relieving factors: sitting on donut  pillow, sometimes pain medication  Walking is limited to 1.5 miles (3 miles is goal)  PRECAUTIONS: None  WEIGHT BEARING RESTRICTIONS No  FALLS:  Has patient fallen in last 6 months? Yes. Number of falls 1.  Fell down the steps while looking at watch. No balance deficits overall.   LIVING ENVIRONMENT: Lives with: lives with their family Lives in: House/apartment Stairs: Yes: External from garage.    OCCUPATION: retired   PLOF: Independent  PATIENT GOALS reduce LBP  OBJECTIVE:   DIAGNOSTIC FINDINGS:  From MD note: The last MRI was performed in 2021 through the [place] [facility].  She had multilevel mild to moderate degenerative changes and moderate spinal stenosis at L2-3 secondary to disc bulge and facet arthropathy.    PATIENT SURVEYS:  FOTO 60 (goal is 38) FOTO 62 03/05/22  SCREENING FOR RED FLAGS: Bowel or bladder incontinence: No Spinal tumors: No Cauda equina syndrome: No Compression fracture: No Abdominal aneurysm: No  COGNITION:  Overall cognitive status: Within functional limits for tasks assessed     SENSATION: WFL  MUSCLE LENGTH: Hamstring length is limited by 25% with Lt LE pain reported at end range  POSTURE: rounded shoulders, forward head, and flexed trunk   PALPATION: Tension in bil lumbar paraspinals with pain L3-5 bilaterally, trigger points   LUMBAR ROM:  Full with limited lumbar segmental mobility.  Pain with Lt sidebending and lumbar flexion.   LOWER EXTREMITY ROM:    Hip flexibility limited by 25% in hip flexion and 50% in IR/ER  LOWER EXTREMITY MMT:   Bil hip strength 4/5, knee 4+/5 02/28/22: Bil hip flexors 4+/5, all others 4/5, quads 4+/5: tested in pool    LUMBAR SPECIAL TESTS:  Straight leg raise test: Negative  GAIT: Distance walked: 50 Assistive device utilized: None Level of assistance: Complete Independence Comments: increased trunk rotation, flexed trunk  TODAY'S TREATMENT   03/05/22: Nustep L5 x 33mn PTA  present to discuss progress. Sit to stand  holding 10# KB 2x15 Red loop supine clamshell 2x15 with TA contracting Supine SLR 2# with core activation 10x Bil Sidelying hip abduction 2.5# 10x Bil Supine bridge with red loop clamshell 2x10 Lower trunk rotation after bridge  10x 6' step 1UE for support 15x2 bil Sidelying red clam Bil 10x,  FOTO given: see above  02/28/22: Aquatic  Pt arrives for aquatic physical therapy. Treatment took place in 3.5-5.5 feet of water. Water temperature was 91 degrees F.. Pt entered the pool via stairs reciprocally and mild use of rails. Pt requires buoyancy of water for support and to offload joints with strengthening exercises.    Seated water bench with 75% submersion Pt performed seated LE AROM exercises 20x in all planes,   Standing 75% depth: water walking 6 lengths in each direction holding small noodle for postural support and pt was initially a little nervous so holding on to something decreased her anxiety.Wall exs Bil: 10x hip flex/ext/abd/circumduction. Hold single buoy down under water 5 sec and contract core 5x, then add march 10x. Lat press with blue noodle 2x10 against wall, VC to contract core and glutes.  Tried horesback bicycle but pt could not balance. Switched to noodle behind pt with moderate support from PTA for trunk control 1 min bicycle, 1 min decompression float. 3x. Hamstring stretch on second step holding onto th erails bil 3x 30 sec.  02/26/22: Nustep L5 x 53mn PTA present to discuss progress. Sit to stand holding 10# KB 3x10 Red band supine clamshell 3x10 with TA contracting Supine SLR 2# with core activation 10x Bil Sidelying hip abduction 2# 10x  Supine bridge with ball squeeze 2x10 Lower trunk rotation after bridge  10x 6' step 1UE for support 10x2 bil   PATIENT EDUCATION:  Education details: Access Code: [email or web address], DN info Person educated: Patient Education method: Explanation, Demonstration, and Handouts Education  comprehension: verbalized understanding and returned demonstration   HOME EXERCISE PROGRAM: Access Code: DDoth[facility] LLCURL: [email or web address] Date: 02/07/2022 Prepared by: KClaiborne Billings Exercises - Supine Lower Trunk Rotation  - 3 x daily - 7 x weekly - 1 sets - 3 reps - 20 hold - Hooklying Single Knee to Chest  - 3 x daily - 7 x weekly - 1 sets - 3 reps - 20 hold - Seated Hamstring Stretch  - 3 x daily - 7 x weekly - 1 sets - 3 reps - 20 hold - Seated Piriformis Stretch with Trunk Bend  - 3 x daily - 7 x weekly - 1 sets - 3 reps - 20 hold - Clamshell  - 1 x daily - 7 x weekly - 2 sets - 10 reps - Sit to Stand Without Arm Support  - 1 x daily - 7 x weekly - 2 sets - 10 reps - Supine Hip Adduction Isometric with Ball  - 1 x daily - 7 x weekly - 2 sets - 10 reps - 5 hold - Supine Active Straight Leg Raise  - 1 x daily - 7 x weekly - 1 sets - 10 reps - Sidelying Hip Abduction  - 1 x daily - 7 x weekly - 1 sets - 10 reps - Supine Bridge  - 1 x daily - 7 x weekly - 1 sets - 10 reps ASSESSMENT:  CLINICAL IMPRESSION: Pt reports feeling great after her first aquatic session and [place]. Pt tolerated increasing her resistance well, no problems or pain. FOTO slightly improved.   OBJECTIVE IMPAIRMENTS  decreased activity tolerance, difficulty walking, decreased ROM, decreased strength, increased muscle spasms, impaired flexibility, postural dysfunction, and pain.   ACTIVITY LIMITATIONS sitting, standing, squatting, and locomotion level  PARTICIPATION LIMITATIONS: meal prep, cleaning, laundry, shopping, and community activity  PERSONAL FACTORS Age and 1-2 comorbidities: 2 TKAs, chronic LBP  are also affecting patient's functional outcome.   REHAB POTENTIAL: Good  CLINICAL DECISION MAKING: Stable/uncomplicated  EVALUATION COMPLEXITY: Low   GOALS: Goals reviewed with patient? Yes  SHORT TERM GOALS: Target date: 02/07/2022  Be  independent in initial HEP Baseline: Goal status: MET  2.  Report > or = to 30% reduction in LBP with standing and sitting  Baseline: 25-30% (02/07/22) Goal status: Goal met 02/28/22 75%  3.  Walk > or = to 2 miles for exercise without limitation due to LBP Baseline: 2.30 yesterday Goal status: met  LONG TERM GOALS: Target date: 03/07/2022  Be independent in advanced HEP Baseline:  Goal status: On going  2.  Reduce FOTO to < or = to 71 Baseline: 60 Goal status: INITIAL  3.  Verbalize and demonstrate body mechanics modifications for lumbar protection with ADLs and self-care Baseline:  Goal status: INITIAL  4.  Report > or = to 70% reduction in LBP with standing and sitting  Baseline: 35% (02/21/22) Goal status: Met 02/28/22  5.  Return to regular walking for exercise up to 3 miles without limitation due to LBP Baseline:  Goal status: Has returned to the gym but has not gotten back into walking just yet.    PLAN: PT FREQUENCY: 1-2x/week  PT DURATION: 8 weeks  PLANNED INTERVENTIONS: Therapeutic exercises, Therapeutic activity, Neuromuscular re-education, Balance training, Gait training, Patient/Family education, Self Care, Joint mobilization, Stair training, Aquatic Therapy, Dry Needling, Electrical stimulation, Cryotherapy, Moist heat, Taping, and Manual therapy.  PLAN FOR NEXT SESSION: ERO next visit: pt would like to have 3 more dry needling sessions since it is so helpful and 3 more aquatic sessions as she plans to join [place] and would like to have a water program.   Myrene Galas, PTA 03/05/22 11:40 AM    [facility] [address], [place], [place] [address] Phone # [phone] Fax [phone]

## 2022-03-07 ENCOUNTER — Ambulatory Visit: Payer: Medicare Other

## 2022-03-07 DIAGNOSIS — M5459 Other low back pain: Secondary | ICD-10-CM

## 2022-03-07 DIAGNOSIS — R252 Cramp and spasm: Secondary | ICD-10-CM

## 2022-03-07 DIAGNOSIS — M6281 Muscle weakness (generalized): Secondary | ICD-10-CM | POA: Diagnosis not present

## 2022-03-07 NOTE — Therapy (Signed)
OUTPATIENT PHYSICAL THERAPY TREATMENT      Patient Name: Kimberly [name] MRN: [id number] DOB:[birth year], 71 y.o., female Today's Date: 03/07/2022     PT End of Session - 03/07/22 1146     Visit Number 12    Date for PT Re-Evaluation 04/20/22    Authorization Type Medicare B- KX at 15    Progress Note Due on Visit 10    PT Start Time 1101    PT Stop Time 1146    PT Time Calculation (min) 45 min    Activity Tolerance Patient tolerated treatment well    Behavior During Therapy [facility] for tasks assessed/performed                       Past Medical History:  Diagnosis Date   DDD (degenerative disc disease), lumbar    GERD (gastroesophageal reflux disease)    History of anemia    History of COVID-19    11-11-2020 positive result in care everywhere   History of DVT (deep vein thrombosis) 2003   per pt completed coumadin x 6 mon, never a blood clot prior to 2003 and none since   Hypertension    followed by pcp   (nuclear stress test in epic 05-13-2019, Low risk without ischemia, normal LV and wall motion , ef 68%)   Hypothyroidism    followed by pcp   Lattice degeneration of right retina    OA (osteoarthritis)    Osteopenia    PMB (postmenopausal bleeding)    Pre-diabetes    Vegetarian diet    eats fish and eggs; no meat   Past Surgical History:  Procedure Laterality Date   CATARACT EXTRACTION W/ INTRAOCULAR LENS IMPLANT Bilateral 07/2020   COLONOSCOPY  2016   DILATATION & CURETTAGE/HYSTEROSCOPY WITH MYOSURE N/A 03/27/2021   Procedure: DILATATION & CURETTAGE/HYSTEROSCOPY/Polypectomy WITH MYOSURE;  Surgeon: Azucena Fallen, MD;  Location: [facility];  Service: Gynecology;  Laterality: N/A;   KNEE ARTHROSCOPY Right 03/16/2003   OOPHORECTOMY Left 1999   benign cyst per pt   TOTAL KNEE ARTHROPLASTY Right 09/14/2014   Procedure: RIGHT TOTAL KNEE ARTHROPLASTY;  Surgeon: Garald Balding, MD;  Location: [place];  Service: Orthopedics;  Laterality: Right;    TOTAL KNEE ARTHROPLASTY Left 11/20/2016   Procedure: LEFT TOTAL KNEE ARTHROPLASTY;  Surgeon: Garald Balding, MD;  Location: [place];  Service: Orthopedics;  Laterality: Left;   Patient Active Problem List   Diagnosis Date Noted   Low back pain 12/12/2021   Dyslipidemia 10/25/2021   Type 2 diabetes mellitus with hyperglycemia ([place]) 04/10/2021   Abnormal weight gain 07/04/2020   Epigastric pain 07/04/2020   Pruritus ani 07/04/2020   S/P total knee replacement using cement, left 11/20/2016   Osteopenia 02/28/2015   Left lumbar radiculitis 02/28/2015   Acute blood loss anemia 09/20/2014   Primary osteoarthritis of right knee 09/14/2014   Osteoarthritis of left knee 09/14/2014   Vitamin B 12 deficiency 07/29/2014   Obesity (BMI 30-39.9) 12/08/2013   Osteoarthritis, knee 62/95/2841   Metabolic syndrome 32/44/0102   DVT, HX OF 02/10/2010   Vitamin D deficiency 08/25/2007   Disorder of bone and cartilage 04/08/2007   Hypothyroidism 11/18/2006   ANEMIA-NOS 11/18/2006   Essential hypertension 11/18/2006   GERD 11/18/2006    PCP: Carolann Littler, MD  REFERRING PROVIDER: [name], MD  REFERRING DIAG: [phone] (ICD-10-CM) - Acute left-sided low back pain with left-sided sciatica  Rationale for Evaluation and Treatment Rehabilitation  THERAPY DIAG:  Other low back pain - Plan: PT plan of care cert/re-cert  Cramp and spasm - Plan: PT plan of care cert/re-cert  Muscle weakness (generalized) - Plan: PT plan of care cert/re-cert  ONSET DATE: 1.5 months ago  SUBJECTIVE:                                                                                                                                                                                           SUBJECTIVE STATEMENT: I really loved the pool and I want to do more to learn exercises to do on my own.  The needling is also helping.  60% overall improvement.   PERTINENT HISTORY:  Bil TKAs  PAIN:  Are you having  pain? Not right now: NPRS scale: 0/10 today, 2/10 with activity Pain location: Pain description: Lt foot burning, aching in low back with standing 15-20 Aggravating factors: standing, sleeping wrong  Relieving factors: sitting on donut pillow, sometimes pain medication  Walking is limited to 1.5 miles (3 miles is goal)  PRECAUTIONS: None  WEIGHT BEARING RESTRICTIONS No  FALLS:  Has patient fallen in last 6 months? Yes. Number of falls 1.  Fell down the steps while looking at watch. No balance deficits overall.   LIVING ENVIRONMENT: Lives with: lives with their family Lives in: House/apartment Stairs: Yes: External from garage.    OCCUPATION: retired   PLOF: Independent  PATIENT GOALS reduce LBP  OBJECTIVE:   DIAGNOSTIC FINDINGS:  From MD note: The last MRI was performed in 2021 through the [place] [facility].  She had multilevel mild to moderate degenerative changes and moderate spinal stenosis at L2-3 secondary to disc bulge and facet arthropathy.    PATIENT SURVEYS:  FOTO 60 (goal is 73) FOTO 62 03/05/22  SCREENING FOR RED FLAGS: Bowel or bladder incontinence: No Spinal tumors: No Cauda equina syndrome: No Compression fracture: No Abdominal aneurysm: No  COGNITION:  Overall cognitive status: Within functional limits for tasks assessed     SENSATION: WFL  MUSCLE LENGTH: Hamstring length is limited by 25% with Lt LE pain reported at end range  POSTURE: rounded shoulders, forward head, and flexed trunk   PALPATION: Tension in bil lumbar paraspinals with pain L3-5 bilaterally, trigger points   LUMBAR ROM:  Full with limited lumbar segmental mobility.  Pain with Lt sidebending and lumbar flexion.   LOWER EXTREMITY ROM:    Hip flexibility limited by 25% in hip flexion and 50% in IR/ER  LOWER EXTREMITY MMT:   Bil hip strength 4/5, knee 4+/5 02/28/22: Bil hip flexors 4+/5, all others 4/5, quads 4+/5: tested in pool    LUMBAR SPECIAL TESTS:  Straight  leg raise test: Negative  GAIT: Distance walked: 50 Assistive device utilized: None Level of assistance: Complete Independence Comments: increased trunk rotation, flexed trunk  TODAY'S TREATMENT   03/05/22: Nustep L5 x 44mn PT present to discuss progress. Sit to stand holding 10# KB 2x15 Seated hamstring stretch  Supine SLR 2.5# with core activation 10x Bil Sidelying hip abduction 2.5# 10x Bil Trigger Point Dry-Needling  Treatment instructions: Expect mild to moderate muscle soreness. S/S of pneumothorax if dry needled over a lung field, and to seek immediate medical attention should they occur. Patient verbalized understanding of these instructions and education.  Patient Consent Given: Yes Education handout provided: Previously provided Muscles treated: bil lumbar multfidi and gluteals  Treatment response/outcome: Utilized skilled palpation to identify trigger points.  During dry needling able to palpate muscle twitch and muscle elongation  Elongation and release after needling  Skilled palpation and monitoring by PT during dry needling    02/28/22: Aquatic  Pt arrives for aquatic physical therapy. Treatment took place in 3.5-5.5 feet of water. Water temperature was 91 degrees F.. Pt entered the pool via stairs reciprocally and mild use of rails. Pt requires buoyancy of water for support and to offload joints with strengthening exercises.    Seated water bench with 75% submersion Pt performed seated LE AROM exercises 20x in all planes,   Standing 75% depth: water walking 6 lengths in each direction holding small noodle for postural support and pt was initially a little nervous so holding on to something decreased her anxiety.Wall exs Bil: 10x hip flex/ext/abd/circumduction. Hold single buoy down under water 5 sec and contract core 5x, then add march 10x. Lat press with blue noodle 2x10 against wall, VC to contract core and glutes.  Tried horesback bicycle but pt could not balance.  Switched to noodle behind pt with moderate support from PTA for trunk control 1 min bicycle, 1 min decompression float. 3x. Hamstring stretch on second step holding onto th erails bil 3x 30 sec.  02/26/22: Nustep L5 x 145m PTA present to discuss progress. Sit to stand holding 10# KB 3x10 Red band supine clamshell 3x10 with TA contracting Supine SLR 2# with core activation 10x Bil Sidelying hip abduction 2# 10x  Supine bridge with ball squeeze 2x10 Lower trunk rotation after bridge  10x 6' step 1UE for support 10x2 bil   PATIENT EDUCATION:  Education details: Access Code: [email or web address], DN info Person educated: Patient Education method: Explanation, Demonstration, and Handouts Education comprehension: verbalized understanding and returned demonstration   HOME EXERCISE PROGRAM: Access Code: DPProvidence Portland Medical CenterRL: [email or web address] Date: 02/07/2022 Prepared by: KeClaiborne BillingsExercises - Supine Lower Trunk Rotation  - 3 x daily - 7 x weekly - 1 sets - 3 reps - 20 hold - Hooklying Single Knee to Chest  - 3 x daily - 7 x weekly - 1 sets - 3 reps - 20 hold - Seated Hamstring Stretch  - 3 x daily - 7 x weekly - 1 sets - 3 reps - 20 hold - Seated Piriformis Stretch with Trunk Bend  - 3 x daily - 7 x weekly - 1 sets - 3 reps - 20 hold - Clamshell  - 1 x daily - 7 x weekly - 2 sets - 10 reps - Sit to Stand Without Arm Support  - 1 x daily - 7 x weekly - 2 sets - 10 reps - Supine Hip Adduction Isometric with Ball  - 1 x daily - 7 x weekly - 2 sets -  10 reps - 5 hold - Supine Active Straight Leg Raise  - 1 x daily - 7 x weekly - 1 sets - 10 reps - Sidelying Hip Abduction  - 1 x daily - 7 x weekly - 1 sets - 10 reps - Supine Bridge  - 1 x daily - 7 x weekly - 1 sets - 10 reps ASSESSMENT:  CLINICAL IMPRESSION: Pt is making steady progress with PT.  She reports reduced symptoms overall and is independent in HEP for strength and flexibility.  Pt is benefiting from [employer] and would  like to begin exercising in the water regularly after discharge.  She is benefiting from buoyancy and un weighting properties of the water.  She requires future session to be independent with a pool exercise program.  Pt with tension and trigger points in her lumbar spine and gluteals and had good response to DN and manual therapy with twitch response and improved tissue mobility after session.  See below for goal status and updated goals.  Pt will benefit from PT for 2-3 land and aquatic treatments each to allow for independence with exercise program and return to prior level of function.   OBJECTIVE IMPAIRMENTS decreased activity tolerance, difficulty walking, decreased ROM, decreased strength, increased muscle spasms, impaired flexibility, postural dysfunction, and pain.   ACTIVITY LIMITATIONS sitting, standing, squatting, and locomotion level  PARTICIPATION LIMITATIONS: meal prep, cleaning, laundry, shopping, and community activity  PERSONAL FACTORS Age and 1-2 comorbidities: 2 TKAs, chronic LBP  are also affecting patient's functional outcome.   REHAB POTENTIAL: Good  CLINICAL DECISION MAKING: Stable/uncomplicated  EVALUATION COMPLEXITY: Low   GOALS: Goals reviewed with patient? Yes  SHORT TERM GOALS: Target date: 02/07/2022  Be independent in initial HEP Baseline: Goal status: MET  2.  Report > or = to 30% reduction in LBP with standing and sitting  Baseline: 25-30% (02/07/22) Goal status: Goal met 02/28/22 75%  3.  Walk > or = to 2 miles for exercise without limitation due to LBP Baseline: 2.30 yesterday Goal status: met  LONG TERM GOALS: Target date: 04/20/22  Be independent in advanced HEP including aquatics exercises for independence after D/C  Baseline: independent in land based exercise and needs further instruction in aquatics Goal status: On going  2.  Reduce FOTO to < or = to 71 Baseline: 62 (03/05/22) Goal status: In progress   3.  Verbalize and demonstrate  body mechanics modifications for lumbar protection with ADLs and self-care Baseline:  Goal status: MET  4.  Report > or = to 70% reduction in LBP with standing and sitting  Baseline: 60% (03/07/22) Goal status: In progress   5.  Return to regular walking for exercise up to 3 miles without limitation due to LBP Baseline: daily 1.25 miles  Goal status: In progress    PLAN: PT FREQUENCY: 1-2x/week  PT DURATION: 6 weeks   PLANNED INTERVENTIONS: Therapeutic exercises, Therapeutic activity, Neuromuscular re-education, Balance training, Gait training, Patient/Family education, Self Care, Joint mobilization, Stair training, Aquatic Therapy, Dry Needling, Electrical stimulation, Cryotherapy, Moist heat, Taping, and Manual therapy.  PLAN FOR NEXT SESSION: Continue strength progress, DN to low back and aquatics   Sigurd Sos, PT 03/07/22 11:47 AM   [facility] [address] [place], [place] [address] Phone # [phone] Fax [phone]

## 2022-03-11 DIAGNOSIS — Z23 Encounter for immunization: Secondary | ICD-10-CM | POA: Diagnosis not present

## 2022-03-14 ENCOUNTER — Ambulatory Visit: Payer: Medicare Other | Attending: Orthopaedic Surgery

## 2022-03-14 DIAGNOSIS — R252 Cramp and spasm: Secondary | ICD-10-CM | POA: Insufficient documentation

## 2022-03-14 DIAGNOSIS — M6281 Muscle weakness (generalized): Secondary | ICD-10-CM | POA: Insufficient documentation

## 2022-03-14 DIAGNOSIS — M5459 Other low back pain: Secondary | ICD-10-CM | POA: Insufficient documentation

## 2022-03-14 DIAGNOSIS — R42 Dizziness and giddiness: Secondary | ICD-10-CM | POA: Diagnosis not present

## 2022-03-14 NOTE — Therapy (Addendum)
OUTPATIENT PHYSICAL THERAPY TREATMENT      Patient Name: Kimberly [name] MRN: [id number] DOB:Sep 16, 1950, 71 y.o., female Today's Date: 03/14/2022     PT End of Session - 03/14/22 1543     Visit Number 13   Date for PT Re-Evaluation 04/20/22    Authorization Type Medicare B- KX at 15    Progress Note Due on Visit 10    PT Start Time 8250    PT Stop Time 5397    PT Time Calculation (min) 40 min    Activity Tolerance Patient tolerated treatment well    Behavior During Therapy [facility] for tasks assessed/performed                        Past Medical History:  Diagnosis Date   DDD (degenerative disc disease), lumbar    GERD (gastroesophageal reflux disease)    History of anemia    History of COVID-19    11-11-2020 positive result in care everywhere   History of DVT (deep vein thrombosis) 2003   per pt completed coumadin x 6 mon, never a blood clot prior to 2003 and none since   Hypertension    followed by pcp   (nuclear stress test in epic 05-13-2019, Low risk without ischemia, normal LV and wall motion , ef 68%)   Hypothyroidism    followed by pcp   Lattice degeneration of right retina    OA (osteoarthritis)    Osteopenia    PMB (postmenopausal bleeding)    Pre-diabetes    Vegetarian diet    eats fish and eggs; no meat   Past Surgical History:  Procedure Laterality Date   CATARACT EXTRACTION W/ INTRAOCULAR LENS IMPLANT Bilateral 07/2020   COLONOSCOPY  2016   DILATATION & CURETTAGE/HYSTEROSCOPY WITH MYOSURE N/A 03/27/2021   Procedure: DILATATION & CURETTAGE/HYSTEROSCOPY/Polypectomy WITH MYOSURE;  Surgeon: Azucena Fallen, MD;  Location: [facility];  Service: Gynecology;  Laterality: N/A;   KNEE ARTHROSCOPY Right 03/16/2003   OOPHORECTOMY Left 1999   benign cyst per pt   TOTAL KNEE ARTHROPLASTY Right 09/14/2014   Procedure: RIGHT TOTAL KNEE ARTHROPLASTY;  Surgeon: Garald Balding, MD;  Location: [place];  Service: Orthopedics;  Laterality: Right;    TOTAL KNEE ARTHROPLASTY Left 11/20/2016   Procedure: LEFT TOTAL KNEE ARTHROPLASTY;  Surgeon: Garald Balding, MD;  Location: [place];  Service: Orthopedics;  Laterality: Left;   Patient Active Problem List   Diagnosis Date Noted   Low back pain 12/12/2021   Dyslipidemia 10/25/2021   Type 2 diabetes mellitus with hyperglycemia ([place]) 04/10/2021   Abnormal weight gain 07/04/2020   Epigastric pain 07/04/2020   Pruritus ani 07/04/2020   S/P total knee replacement using cement, left 11/20/2016   Osteopenia 02/28/2015   Left lumbar radiculitis 02/28/2015   Acute blood loss anemia 09/20/2014   Primary osteoarthritis of right knee 09/14/2014   Osteoarthritis of left knee 09/14/2014   Vitamin B 12 deficiency 07/29/2014   Obesity (BMI 30-39.9) 12/08/2013   Osteoarthritis, knee 67/34/1937   Metabolic syndrome 90/24/0973   DVT, HX OF 02/10/2010   Vitamin D deficiency 08/25/2007   Disorder of bone and cartilage 04/08/2007   Hypothyroidism 11/18/2006   ANEMIA-NOS 11/18/2006   Essential hypertension 11/18/2006   GERD 11/18/2006    PCP: Carolann Littler, MD  REFERRING PROVIDER: [name], MD  REFERRING DIAG: [phone] (ICD-10-CM) - Acute left-sided low back pain with left-sided sciatica  Rationale for Evaluation and Treatment Rehabilitation  THERAPY DIAG:  Other low back pain  Cramp and spasm  Muscle weakness (generalized)  ONSET DATE: 1.5 months ago  SUBJECTIVE:                                                                                                                                                                                           SUBJECTIVE STATEMENT: I woke up 2 nights ago and my Lt leg was burning.  I rubbed some Voltaren on it and it went away.    PERTINENT HISTORY:  Bil TKAs  PAIN:  Are you having pain? Not right now: NPRS scale: 2-3/10 Pain location: Pain description: Lt gluteal Aggravating factors: standing, sleeping wrong  Relieving factors:  sitting on donut pillow, sometimes pain medication  Walking is limited to 1.5 miles (3 miles is goal)  PRECAUTIONS: None  WEIGHT BEARING RESTRICTIONS No  FALLS:  Has patient fallen in last 6 months? Yes. Number of falls 1.  Fell down the steps while looking at watch. No balance deficits overall.   LIVING ENVIRONMENT: Lives with: lives with their family Lives in: House/apartment Stairs: Yes: External from garage.    OCCUPATION: retired   PLOF: Independent  PATIENT GOALS reduce LBP  OBJECTIVE:   DIAGNOSTIC FINDINGS:  From MD note: The last MRI was performed in 2021 through the [facility].  She had multilevel mild to moderate degenerative changes and moderate spinal stenosis at L2-3 secondary to disc bulge and facet arthropathy.    PATIENT SURVEYS:  FOTO 60 (goal is 9) FOTO 62 03/05/22  SCREENING FOR RED FLAGS: Bowel or bladder incontinence: No Spinal tumors: No Cauda equina syndrome: No Compression fracture: No Abdominal aneurysm: No  COGNITION:  Overall cognitive status: Within functional limits for tasks assessed     SENSATION: WFL  MUSCLE LENGTH: Hamstring length is limited by 25% with Lt LE pain reported at end range  POSTURE: rounded shoulders, forward head, and flexed trunk   PALPATION: Tension in bil lumbar paraspinals with pain L3-5 bilaterally, trigger points   LUMBAR ROM:  Full with limited lumbar segmental mobility.  Pain with Lt sidebending and lumbar flexion.   LOWER EXTREMITY ROM:    Hip flexibility limited by 25% in hip flexion and 50% in IR/ER  LOWER EXTREMITY MMT:   Bil hip strength 4/5, knee 4+/5 02/28/22: Bil hip flexors 4+/5, all others 4/5, quads 4+/5: tested in pool    LUMBAR SPECIAL TESTS:  Straight leg raise test: Negative  GAIT: Distance walked: 50 Assistive device utilized: None Level of assistance: Complete Independence Comments: increased trunk rotation, flexed trunk  TODAY'S TREATMENT  03/14/22: Nustep L5  x 56mn PT present  to discuss progress. Sit to stand holding 10# KB 2x15 Seated hamstring stretch  3x 20 seconds  Supine SLR 2.5# with core activation 10x Bil Sidelying hip abduction 2.5# 10x Bil Trigger Point Dry-Needling  Treatment instructions: Expect mild to moderate muscle soreness. S/S of pneumothorax if dry needled over a lung field, and to seek immediate medical attention should they occur. Patient verbalized understanding of these instructions and education.  Patient Consent Given: Yes Education handout provided: Previously provided Muscles treated: bil lumbar multfidi and gluteals  Treatment response/outcome: Utilized skilled palpation to identify trigger points.  During dry needling able to palpate muscle twitch and muscle elongation  Elongation and release after needling  Skilled palpation and monitoring by PT during dry needling    03/05/22: Nustep L5 x 21mn PT present to discuss progress. Sit to stand holding 10# KB 2x15 Seated hamstring stretch  Supine SLR 2.5# with core activation 10x Bil Sidelying hip abduction 2.5# 10x Bil Trigger Point Dry-Needling  Treatment instructions: Expect mild to moderate muscle soreness. S/S of pneumothorax if dry needled over a lung field, and to seek immediate medical attention should they occur. Patient verbalized understanding of these instructions and education.  Patient Consent Given: Yes Education handout provided: Previously provided Muscles treated: bil lumbar multfidi and gluteals  Treatment response/outcome: Utilized skilled palpation to identify trigger points.  During dry needling able to palpate muscle twitch and muscle elongation  Elongation and release after needling  Skilled palpation and monitoring by PT during dry needling    02/28/22: Aquatic  Pt arrives for aquatic physical therapy. Treatment took place in 3.5-5.5 feet of water. Water temperature was 91 degrees F.. Pt entered the pool via stairs reciprocally and mild  use of rails. Pt requires buoyancy of water for support and to offload joints with strengthening exercises.    Seated water bench with 75% submersion Pt performed seated LE AROM exercises 20x in all planes,   Standing 75% depth: water walking 6 lengths in each direction holding small noodle for postural support and pt was initially a little nervous so holding on to something decreased her anxiety.Wall exs Bil: 10x hip flex/ext/abd/circumduction. Hold single buoy down under water 5 sec and contract core 5x, then add march 10x. Lat press with blue noodle 2x10 against wall, VC to contract core and glutes.  Tried horesback bicycle but pt could not balance. Switched to noodle behind pt with moderate support from PTA for trunk control 1 min bicycle, 1 min decompression float. 3x. Hamstring stretch on second step holding onto th erails bil 3x 30 sec.   PATIENT EDUCATION:  Education details: Access Code: [email or web address], DN info Person educated: Patient Education method: Explanation, Demonstration, and Handouts Education comprehension: verbalized understanding and returned demonstration   HOME EXERCISE PROGRAM: [facility]: [email or web address] Date: 02/07/2022 Prepared by: KClaiborne Billings Exercises - Supine Lower Trunk Rotation  - 3 x daily - 7 x weekly - 1 sets - 3 reps - 20 hold - Hooklying Single Knee to Chest  - 3 x daily - 7 x weekly - 1 sets - 3 reps - 20 hold - Seated Hamstring Stretch  - 3 x daily - 7 x weekly - 1 sets - 3 reps - 20 hold - Seated Piriformis Stretch with Trunk Bend  - 3 x daily - 7 x weekly - 1 sets - 3 reps - 20 hold - Clamshell  - 1 x daily - 7 x weekly - 2 sets - 10 reps - Sit to  Stand Without Arm Support  - 1 x daily - 7 x weekly - 2 sets - 10 reps - Supine Hip Adduction Isometric with Ball  - 1 x daily - 7 x weekly - 2 sets - 10 reps - 5 hold - Supine Active Straight Leg Raise  - 1 x daily - 7 x weekly - 1 sets - 10 reps - Sidelying Hip Abduction  - 1  x daily - 7 x weekly - 1 sets - 10 reps - Supine Bridge  - 1 x daily - 7 x weekly - 1 sets - 10 reps ASSESSMENT:  CLINICAL IMPRESSION: Pt had some Lt LE radiculopathy at night 2 nights ago and this resolved quickly.  Pt continues to exercise at home and at the gym.  PT monitored pt for technique and pain today.  Pt with tension and trigger points in her lumbar spine and gluteals and had good response to DN and manual therapy with twitch response and improved tissue mobility after session.  See below for goal status and updated goals.  Pt will benefit from PT for 2-3 land and aquatic treatments each to allow for independence with exercise program and return to prior level of function.   OBJECTIVE IMPAIRMENTS decreased activity tolerance, difficulty walking, decreased ROM, decreased strength, increased muscle spasms, impaired flexibility, postural dysfunction, and pain.   ACTIVITY LIMITATIONS sitting, standing, squatting, and locomotion level  PARTICIPATION LIMITATIONS: meal prep, cleaning, laundry, shopping, and community activity  PERSONAL FACTORS Age and 1-2 comorbidities: 2 TKAs, chronic LBP  are also affecting patient's functional outcome.   REHAB POTENTIAL: Good  CLINICAL DECISION MAKING: Stable/uncomplicated  EVALUATION COMPLEXITY: Low   GOALS: Goals reviewed with patient? Yes  SHORT TERM GOALS: Target date: 02/07/2022  Be independent in initial HEP Baseline: Goal status: MET  2.  Report > or = to 30% reduction in LBP with standing and sitting  Baseline: 25-30% (02/07/22) Goal status: Goal met 02/28/22 75%  3.  Walk > or = to 2 miles for exercise without limitation due to LBP Baseline: 2.30 yesterday Goal status: met  LONG TERM GOALS: Target date: 04/20/22  Be independent in advanced HEP including aquatics exercises for independence after D/C  Baseline: independent in land based exercise and needs further instruction in aquatics Goal status: On going  2.  Reduce FOTO to  < or = to 71 Baseline: 62 (03/05/22) Goal status: In progress   3.  Verbalize and demonstrate body mechanics modifications for lumbar protection with ADLs and self-care Baseline:  Goal status: MET  4.  Report > or = to 70% reduction in LBP with standing and sitting  Baseline: 60% (03/07/22) Goal status: In progress   5.  Return to regular walking for exercise up to 3 miles without limitation due to LBP Baseline: daily 1.25 miles  Goal status: In progress    PLAN: PT FREQUENCY: 1-2x/week  PT DURATION: 6 weeks   PLANNED INTERVENTIONS: Therapeutic exercises, Therapeutic activity, Neuromuscular re-education, Balance training, Gait training, Patient/Family education, Self Care, Joint mobilization, Stair training, Aquatic Therapy, Dry Needling, Electrical stimulation, Cryotherapy, Moist heat, Taping, and Manual therapy.  PLAN FOR NEXT SESSION: Continue strength progress, DN to low back and aquatics   Sigurd Sos, [place] 03/14/22 4:17 PM   [facility] [address] [place], [place] [address] Phone # [phone] Fax [phone]

## 2022-03-21 ENCOUNTER — Ambulatory Visit: Payer: Medicare Other

## 2022-03-21 ENCOUNTER — Telehealth: Payer: Self-pay | Admitting: Family Medicine

## 2022-03-21 ENCOUNTER — Other Ambulatory Visit: Payer: Self-pay | Admitting: Family Medicine

## 2022-03-21 DIAGNOSIS — R252 Cramp and spasm: Secondary | ICD-10-CM

## 2022-03-21 DIAGNOSIS — R42 Dizziness and giddiness: Secondary | ICD-10-CM

## 2022-03-21 DIAGNOSIS — M5459 Other low back pain: Secondary | ICD-10-CM

## 2022-03-21 DIAGNOSIS — M6281 Muscle weakness (generalized): Secondary | ICD-10-CM | POA: Diagnosis not present

## 2022-03-21 NOTE — Telephone Encounter (Signed)
Patient called into the office and stated she was at her PT appointment and she was experiencing vertigo. Patient stated that physical therapist placed orders in Epic for the patient to receive Vertigo therapy.

## 2022-03-21 NOTE — Therapy (Signed)
OUTPATIENT PHYSICAL THERAPY TREATMENT      Patient Name: Kimberly [name] MRN: [id number] DOB:06-13-1950, 71 y.o., female Today's Date: 03/21/2022     PT End of Session - 03/21/22 1150     Visit Number 2    Date for PT Re-Evaluation 04/20/22    Authorization Type Medicare B- KX at 15    Progress Note Due on Visit 10    PT Start Time 1102    PT Stop Time 1150    PT Time Calculation (min) 48 min    Activity Tolerance Patient tolerated treatment well    Behavior During Therapy [facility] for tasks assessed/performed                         Past Medical History:  Diagnosis Date   DDD (degenerative disc disease), lumbar    GERD (gastroesophageal reflux disease)    History of anemia    History of COVID-19    11-11-2020 positive result in care everywhere   History of DVT (deep vein thrombosis) 2003   per pt completed coumadin x 6 mon, never a blood clot prior to 2003 and none since   Hypertension    followed by pcp   (nuclear stress test in epic 05-13-2019, Low risk without ischemia, normal LV and wall motion , ef 68%)   Hypothyroidism    followed by pcp   Lattice degeneration of right retina    OA (osteoarthritis)    Osteopenia    PMB (postmenopausal bleeding)    Pre-diabetes    Vegetarian diet    eats fish and eggs; no meat   Past Surgical History:  Procedure Laterality Date   CATARACT EXTRACTION W/ INTRAOCULAR LENS IMPLANT Bilateral 07/2020   COLONOSCOPY  2016   DILATATION & CURETTAGE/HYSTEROSCOPY WITH MYOSURE N/A 03/27/2021   Procedure: DILATATION & CURETTAGE/HYSTEROSCOPY/Polypectomy WITH MYOSURE;  Surgeon: Azucena Fallen, MD;  Location: [facility];  Service: Gynecology;  Laterality: N/A;   KNEE ARTHROSCOPY Right 03/16/2003   OOPHORECTOMY Left 1999   benign cyst per pt   TOTAL KNEE ARTHROPLASTY Right 09/14/2014   Procedure: RIGHT TOTAL KNEE ARTHROPLASTY;  Surgeon: Garald Balding, MD;  Location: [place];  Service: Orthopedics;  Laterality: Right;    TOTAL KNEE ARTHROPLASTY Left 11/20/2016   Procedure: LEFT TOTAL KNEE ARTHROPLASTY;  Surgeon: Garald Balding, MD;  Location: [place];  Service: Orthopedics;  Laterality: Left;   Patient Active Problem List   Diagnosis Date Noted   Low back pain 12/12/2021   Dyslipidemia 10/25/2021   Type 2 diabetes mellitus with hyperglycemia ([place]) 04/10/2021   Abnormal weight gain 07/04/2020   Epigastric pain 07/04/2020   Pruritus ani 07/04/2020   S/P total knee replacement using cement, left 11/20/2016   Osteopenia 02/28/2015   Left lumbar radiculitis 02/28/2015   Acute blood loss anemia 09/20/2014   Primary osteoarthritis of right knee 09/14/2014   Osteoarthritis of left knee 09/14/2014   Vitamin B 12 deficiency 07/29/2014   Obesity (BMI 30-39.9) 12/08/2013   Osteoarthritis, knee 21/19/4174   Metabolic syndrome 12/25/4816   DVT, HX OF 02/10/2010   Vitamin D deficiency 08/25/2007   Disorder of bone and cartilage 04/08/2007   Hypothyroidism 11/18/2006   ANEMIA-NOS 11/18/2006   Essential hypertension 11/18/2006   GERD 11/18/2006    PCP: Carolann Littler, MD  REFERRING PROVIDER: [name], MD  REFERRING DIAG: [phone] (ICD-10-CM) - Acute left-sided low back pain with left-sided sciatica  Rationale for Evaluation and Treatment Rehabilitation  THERAPY DIAG:  Other low back pain  Cramp and spasm  Muscle weakness (generalized)  ONSET DATE: 1.5 months ago  SUBJECTIVE:                                                                                                                                                                                           SUBJECTIVE STATEMENT: I went to the gym this morning and walking.  I haven't had any more episodes of Lt LE pain and my foot is improving.   PERTINENT HISTORY:  Bil TKAs  PAIN:  Are you having pain? Not right now: NPRS scale: 2/10 Pain location: Pain description: Lt gluteal Aggravating factors: standing, sleeping wrong   Relieving factors: sitting on donut pillow, sometimes pain medication  Walking is limited to 1.5 miles (3 miles is goal)  PRECAUTIONS: None  WEIGHT BEARING RESTRICTIONS No  FALLS:  Has patient fallen in last 6 months? Yes. Number of falls 1.  Fell down the steps while looking at watch. No balance deficits overall.   LIVING ENVIRONMENT: Lives with: lives with their family Lives in: House/apartment Stairs: Yes: External from garage.    OCCUPATION: retired   PLOF: Independent  PATIENT GOALS reduce LBP  OBJECTIVE:   DIAGNOSTIC FINDINGS:  From MD note: The last MRI was performed in 2021 through the [facility].  She had multilevel mild to moderate degenerative changes and moderate spinal stenosis at L2-3 secondary to disc bulge and facet arthropathy.    PATIENT SURVEYS:  FOTO 60 (goal is 50) FOTO 62 03/05/22  SCREENING FOR RED FLAGS: Bowel or bladder incontinence: No Spinal tumors: No Cauda equina syndrome: No Compression fracture: No Abdominal aneurysm: No  COGNITION:  Overall cognitive status: Within functional limits for tasks assessed     SENSATION: WFL  MUSCLE LENGTH: Hamstring length is limited by 25% with Lt LE pain reported at end range  POSTURE: rounded shoulders, forward head, and flexed trunk   PALPATION: Tension in bil lumbar paraspinals with pain L3-5 bilaterally, trigger points   LUMBAR ROM:  Full with limited lumbar segmental mobility.  Pain with Lt sidebending and lumbar flexion.   LOWER EXTREMITY ROM:    Hip flexibility limited by 25% in hip flexion and 50% in IR/ER  LOWER EXTREMITY MMT:   Bil hip strength 4/5, knee 4+/5 02/28/22: Bil hip flexors 4+/5, all others 4/5, quads 4+/5: tested in pool    LUMBAR SPECIAL TESTS:  Straight leg raise test: Negative  GAIT: Distance walked: 50 Assistive device utilized: None Level of assistance: Complete Independence Comments: increased trunk rotation, flexed trunk  TODAY'S TREATMENT   03/21/22: Nustep L5  x 85mn PT present to discuss progress. Sit to stand holding 10# KB 2x15 Seated hamstring stretch  3x 20 seconds  Supine SLR 2.5# with core activation 10x Bil Sidelying hip abduction 2.5# 10x Bil Trigger Point Dry-Needling  Treatment instructions: Expect mild to moderate muscle soreness. S/S of pneumothorax if dry needled over a lung field, and to seek immediate medical attention should they occur. Patient verbalized understanding of these instructions and education.  Patient Consent Given: Yes Education handout provided: Previously provided Muscles treated: bil lumbar multfidi and gluteals  Treatment response/outcome: Utilized skilled palpation to identify trigger points.  During dry needling able to palpate muscle twitch and muscle elongation  Elongation and release after needling  Skilled palpation and monitoring by PT during dry needling   03/14/22: Nustep L5 x 173m PT present to discuss progress. Sit to stand holding 10# KB 2x15 Seated hamstring stretch  3x 20 seconds  Supine SLR 2.5# with core activation 10x Bil Sidelying hip abduction 2.5# 10x Bil Trigger Point Dry-Needling  Treatment instructions: Expect mild to moderate muscle soreness. S/S of pneumothorax if dry needled over a lung field, and to seek immediate medical attention should they occur. Patient verbalized understanding of these instructions and education.  Patient Consent Given: Yes Education handout provided: Previously provided Muscles treated: bil lumbar multfidi and gluteals  Treatment response/outcome: Utilized skilled palpation to identify trigger points.  During dry needling able to palpate muscle twitch and muscle elongation  Elongation and release after needling  Skilled palpation and monitoring by PT during dry needling    03/05/22: Nustep L5 x 1032mPT present to discuss progress. Sit to stand holding 10# KB 2x15 Seated hamstring stretch  Supine SLR 2.5# with core activation 10x  Bil Sidelying hip abduction 2.5# 10x Bil Trigger Point Dry-Needling  Treatment instructions: Expect mild to moderate muscle soreness. S/S of pneumothorax if dry needled over a lung field, and to seek immediate medical attention should they occur. Patient verbalized understanding of these instructions and education.  Patient Consent Given: Yes Education handout provided: Previously provided Muscles treated: bil lumbar multfidi and gluteals  Treatment response/outcome: Utilized skilled palpation to identify trigger points.  During dry needling able to palpate muscle twitch and muscle elongation  Elongation and release after needling  Skilled palpation and monitoring by PT during dry needling   PATIENT EDUCATION:  Education details: Access Code: [email or web address], DN info Person educated: Patient Education method: Explanation, Demonstration, and Handouts Education comprehension: verbalized understanding and returned demonstration   HOME EXERCISE PROGRAM: Access Code: DPLChi Health St. ElizabethL: [email or web address] Date: 02/07/2022 Prepared by: KelClaiborne Billingsxercises - Supine Lower Trunk Rotation  - 3 x daily - 7 x weekly - 1 sets - 3 reps - 20 hold - Hooklying Single Knee to Chest  - 3 x daily - 7 x weekly - 1 sets - 3 reps - 20 hold - Seated Hamstring Stretch  - 3 x daily - 7 x weekly - 1 sets - 3 reps - 20 hold - Seated Piriformis Stretch with Trunk Bend  - 3 x daily - 7 x weekly - 1 sets - 3 reps - 20 hold - Clamshell  - 1 x daily - 7 x weekly - 2 sets - 10 reps - Sit to Stand Without Arm Support  - 1 x daily - 7 x weekly - 2 sets - 10 reps - Supine Hip Adduction Isometric with Ball  - 1 x daily - 7 x weekly - 2 sets - 10 reps - 5  hold - Supine Active Straight Leg Raise  - 1 x daily - 7 x weekly - 1 sets - 10 reps - Sidelying Hip Abduction  - 1 x daily - 7 x weekly - 1 sets - 10 reps - Supine Bridge  - 1 x daily - 7 x weekly - 1 sets - 10 reps ASSESSMENT:  CLINICAL IMPRESSION: Pt denies any  radiculopathy since last session.  Pt continues to exercise at home and at the gym.  PT monitored pt for technique and pain today.  Pt experienced an episode of BPPV when she got on the mat today and PT reached out to her primary care provider to see if we can get an order to treat this.  Pt with tension and trigger points in her lumbar spine and gluteals and had good response to DN and manual therapy with twitch response and improved tissue mobility after session.   Pt will benefit from PT for 2-3 land and aquatic treatments each to allow for independence with exercise program and return to prior level of function.   OBJECTIVE IMPAIRMENTS decreased activity tolerance, difficulty walking, decreased ROM, decreased strength, increased muscle spasms, impaired flexibility, postural dysfunction, and pain.   ACTIVITY LIMITATIONS sitting, standing, squatting, and locomotion level  PARTICIPATION LIMITATIONS: meal prep, cleaning, laundry, shopping, and community activity  PERSONAL FACTORS Age and 1-2 comorbidities: 2 TKAs, chronic LBP  are also affecting patient's functional outcome.   REHAB POTENTIAL: Good  CLINICAL DECISION MAKING: Stable/uncomplicated  EVALUATION COMPLEXITY: Low   GOALS: Goals reviewed with patient? Yes  SHORT TERM GOALS: Target date: 02/07/2022  Be independent in initial HEP Baseline: Goal status: MET  2.  Report > or = to 30% reduction in LBP with standing and sitting  Baseline: 25-30% (02/07/22) Goal status: Goal met 02/28/22 75%  3.  Walk > or = to 2 miles for exercise without limitation due to LBP Baseline: 2.30 yesterday Goal status: met  LONG TERM GOALS: Target date: 04/20/22  Be independent in advanced HEP including aquatics exercises for independence after D/C  Baseline: independent in land based exercise and needs further instruction in aquatics Goal status: On going  2.  Reduce FOTO to < or = to 71 Baseline: 62 (03/05/22) Goal status: In progress   3.   Verbalize and demonstrate body mechanics modifications for lumbar protection with ADLs and self-care Baseline:  Goal status: MET  4.  Report > or = to 70% reduction in LBP with standing and sitting  Baseline: 60% (03/07/22) Goal status: In progress   5.  Return to regular walking for exercise up to 3 miles without limitation due to LBP Baseline: daily 1-2 miles (03/21/22) Goal status: In progress    PLAN: PT FREQUENCY: 1-2x/week  PT DURATION: 6 weeks   PLANNED INTERVENTIONS: Therapeutic exercises, Therapeutic activity, Neuromuscular re-education, Balance training, Gait training, Patient/Family education, Self Care, Joint mobilization, Stair training, Aquatic Therapy, Dry Needling, Electrical stimulation, Cryotherapy, Moist heat, Taping, and Manual therapy.  PLAN FOR NEXT SESSION: Continue strength progress, DN to low back and aquatics.  Evaluate for vertigo if MD orders.    Sigurd Sos, PT 03/21/22 11:57 AM   [facility] [address], [place], [place] [address] Phone # [phone] Fax [phone]

## 2022-03-21 NOTE — Telephone Encounter (Signed)
Pt seen physical therapist today and there is order for her she is having vertigo. Please advise

## 2022-03-22 ENCOUNTER — Other Ambulatory Visit: Payer: Self-pay

## 2022-03-22 ENCOUNTER — Ambulatory Visit: Payer: Medicare Other | Admitting: Physical Therapy

## 2022-03-22 ENCOUNTER — Encounter: Payer: Medicare Other | Admitting: Physical Therapy

## 2022-03-22 DIAGNOSIS — R42 Dizziness and giddiness: Secondary | ICD-10-CM | POA: Diagnosis not present

## 2022-03-22 DIAGNOSIS — M6281 Muscle weakness (generalized): Secondary | ICD-10-CM | POA: Diagnosis not present

## 2022-03-22 DIAGNOSIS — M5459 Other low back pain: Secondary | ICD-10-CM | POA: Diagnosis not present

## 2022-03-22 DIAGNOSIS — R252 Cramp and spasm: Secondary | ICD-10-CM | POA: Diagnosis not present

## 2022-03-22 NOTE — Telephone Encounter (Signed)
Noted. Patient aware 

## 2022-03-22 NOTE — Therapy (Signed)
OUTPATIENT PHYSICAL THERAPY VESTIBULAR EVALUATION     Patient Name: Kimberly [name] MRN: [id number] DOB:03/15/1951, 71 y.o., female Today's Date: 03/23/2022   PT End of Session - 03/23/22 0907     Visit Number 15    Number of Visits --   8 visits POC for [facility]   Date for PT Re-Evaluation 04/20/22   For [facility]:  04/13/2022   Authorization Type Medicare B- Pt is at [place], visit 15, 03/22/2022    Progress Note Due on Visit 10    PT Start Time 0850    PT Stop Time 0935    PT Time Calculation (min) 45 min    Activity Tolerance Patient tolerated treatment well    Behavior During Therapy [facility] for tasks assessed/performed             Past Medical History:  Diagnosis Date   DDD (degenerative disc disease), lumbar    GERD (gastroesophageal reflux disease)    History of anemia    History of COVID-19    11-11-2020 positive result in care everywhere   History of DVT (deep vein thrombosis) 2003   per pt completed coumadin x 6 mon, never a blood clot prior to 2003 and none since   Hypertension    followed by pcp   (nuclear stress test in epic 05-13-2019, Low risk without ischemia, normal LV and wall motion , ef 68%)   Hypothyroidism    followed by pcp   Lattice degeneration of right retina    OA (osteoarthritis)    Osteopenia    PMB (postmenopausal bleeding)    Pre-diabetes    Vegetarian diet    eats fish and eggs; no meat   Past Surgical History:  Procedure Laterality Date   CATARACT EXTRACTION W/ INTRAOCULAR LENS IMPLANT Bilateral 07/2020   COLONOSCOPY  2016   DILATATION & CURETTAGE/HYSTEROSCOPY WITH MYOSURE N/A 03/27/2021   Procedure: DILATATION & CURETTAGE/HYSTEROSCOPY/Polypectomy WITH MYOSURE;  Surgeon: Azucena Fallen, MD;  Location: [facility];  Service: Gynecology;  Laterality: N/A;   KNEE ARTHROSCOPY Right 03/16/2003   OOPHORECTOMY Left 1999   benign cyst per pt   TOTAL KNEE ARTHROPLASTY Right 09/14/2014   Procedure: RIGHT TOTAL KNEE  ARTHROPLASTY;  Surgeon: Garald Balding, MD;  Location: [place];  Service: Orthopedics;  Laterality: Right;   TOTAL KNEE ARTHROPLASTY Left 11/20/2016   Procedure: LEFT TOTAL KNEE ARTHROPLASTY;  Surgeon: Garald Balding, MD;  Location: [place];  Service: Orthopedics;  Laterality: Left;   Patient Active Problem List   Diagnosis Date Noted   Low back pain 12/12/2021   Dyslipidemia 10/25/2021   Type 2 diabetes mellitus with hyperglycemia ([place]) 04/10/2021   Abnormal weight gain 07/04/2020   Epigastric pain 07/04/2020   Pruritus ani 07/04/2020   S/P total knee replacement using cement, left 11/20/2016   Osteopenia 02/28/2015   Left lumbar radiculitis 02/28/2015   Acute blood loss anemia 09/20/2014   Primary osteoarthritis of right knee 09/14/2014   Osteoarthritis of left knee 09/14/2014   Vitamin B 12 deficiency 07/29/2014   Obesity (BMI 30-39.9) 12/08/2013   Osteoarthritis, knee 07/37/1062   Metabolic syndrome 69/48/5462   DVT, HX OF 02/10/2010   Vitamin D deficiency 08/25/2007   Disorder of bone and cartilage 04/08/2007   Hypothyroidism 11/18/2006   ANEMIA-NOS 11/18/2006   Essential hypertension 11/18/2006   GERD 11/18/2006    PCP: Carolann Littler, MD REFERRING PROVIDER: Carolann Littler, MD  REFERRING DIAG: R42 (ICD-10-CM) - Vertigo   THERAPY DIAG:  Dizziness  and giddiness  ONSET DATE: 03/21/2022 (MD referral and onset of vertigo)  Rationale for Evaluation and Treatment: Rehabilitation  SUBJECTIVE:   SUBJECTIVE STATEMENT: This dizziness is new.  I was a little dizzy while shopping, then when I was sleeping, rolling to the side makes me dizzy.  Was at the other rehab center for my back, when I lied down, everything was spinning.  Also looking up and down brings on the dizzy. Had a fall three months ago and hit my head.  Had CT scan and x-rays. Pt accompanied by: self  PERTINENT HISTORY: See PMH above  PAIN:  Are you having pain? Yes: NPRS scale: 1-2/10 Pain location:  low back-PT at [facility] is addressing Pain description: See [facility] notes Aggravating factors: - Relieving factors: -  PRECAUTIONS: None  FALLS: Has patient fallen in last 6 months? Yes. Number of falls 1  LIVING ENVIRONMENT: Lives with: lives with their spouse Lives in: House/apartment Stairs: to enter home  PLOF: Independent  PATIENT GOALS: To get rid of dizziness  OBJECTIVE:     PATIENT SURVEYS:  FOTO 46 for intake; goal is 61  VESTIBULAR ASSESSMENT:  GENERAL OBSERVATION: No acute distress   SYMPTOM BEHAVIOR:  Subjective history: Started 3 days ago.  Rolling,   Non-Vestibular symptoms: headaches and fullness in R eye sometimes  Type of dizziness: Spinning/Vertigo  Frequency: whenever lying down  Duration: seconds-minutes  Aggravating factors: Induced by position change: lying supine, rolling to the right, and rolling to the left and Induced by motion: looking up at the ceiling and turning head quickly  Relieving factors: head stationary  Progression of symptoms: unchanged  OCULOMOTOR EXAM:  Ocular Alignment: abnormal and R eye lower than L  Ocular ROM: No Limitations and reports some double vision  Spontaneous Nystagmus: absent  Gaze-Induced Nystagmus: left beating with left gaze  Smooth Pursuits: intact  Saccades: intact     VESTIBULAR - OCULAR REFLEX:   Slow VOR: Normal  VOR Cancellation: Normal  Head-Impulse Test: HIT Right: negative HIT Left: negative    POSITIONAL TESTING: Right Dix-Hallpike: upbeating, right nystagmus, c/o dizziness, and Duration: 15 sec Left Dix-Hallpike: upbeating, left nystagmus, c/o dizziness, and Duration: 11 sec Right Roll Test: geotropic nystagmus and Duration: 30 sec Left Roll Test: no nystagmus  VESTIBULAR TREATMENT:                                                                                                   DATE: 03/23/2022  Canalith Repositioning:  BBQ Roll Right: Number of Reps: 1, Response to  Treatment: symptoms improved, and Comment: Did not complete again due to time constraints.  Minimal c/o symptoms upon return to sit.  *Pt is unable to get to quadruped position, stayed in prone on elbows prior to return to sit  PATIENT EDUCATION: Education details: Eval results, POC, rationale for BPPV treatment Person educated: Patient Education method: Explanation Education comprehension: verbalized understanding  HOME EXERCISE PROGRAM:  GOALS: Goals reviewed with patient? Yes  SHORT TERM GOALS: Target date: = LTGs    LONG TERM GOALS: Target date: 04/13/2022  Pt  will be independent with HEP for improved dizziness for improved bed mobility. Baseline:  Goal status: INITIAL  2.  Pt will perform bed mobility tasks without c/o dizziness. Baseline:  Goal status: INITIAL  3.  FOTO score for vertigo to improve to 64 to demonstrate improved dizziness and improved functional mobility. Baseline: 46 at intake Goal status: INITIAL    ASSESSMENT:  CLINICAL IMPRESSION: Patient is a 71 y.o. female who was seen today for physical therapy evaluation and treatment for dizziness/vertigo.   She has several day onset of acute vertigo symptoms, which started when she went to lie down for PT exercises for her back (which she is seeing at [facility]).  With positional vertigo testing today, pt demonstrates multi-canal positional vertigo, in R horizontal canal as well as symptoms presenting in posterior canals.  Treatment initiated today for R horizontal canal, with pt reporting minimal symptoms at end of BBQ roll maneuver.  She will benefit from skilled PT to address positional vertigo symptoms to allow patient full participation in bed mobility and in back-related physical therapy exercises.  OBJECTIVE IMPAIRMENTS: dizziness.   ACTIVITY LIMITATIONS: bed mobility  PARTICIPATION LIMITATIONS:  therapy exercise for back  PERSONAL FACTORS: 3+ comorbidities: see above  are also affecting  patient's functional outcome.   REHAB POTENTIAL: Good  CLINICAL DECISION MAKING: Stable/uncomplicated  EVALUATION COMPLEXITY: Low   PLAN:  PT FREQUENCY: 1-2x/week  PT DURATION: 4 weeks, including eval week  PLANNED INTERVENTIONS: Therapeutic exercises, Therapeutic activity, Neuromuscular re-education, Balance training, Gait training, Patient/Family education, Self Care, Vestibular training, and Canalith repositioning  PLAN FOR NEXT SESSION: Reasess positional BPPV testing and treat appropriate canals.  Initiate HEP as appropriate.   Frazier Butt., PT 03/23/2022, 9:10 AM   [facility], [place], [place] [address] Phone # [phone] Fax # [phone]

## 2022-03-23 ENCOUNTER — Ambulatory Visit: Payer: Medicare Other

## 2022-03-23 DIAGNOSIS — R42 Dizziness and giddiness: Secondary | ICD-10-CM

## 2022-03-23 DIAGNOSIS — M5459 Other low back pain: Secondary | ICD-10-CM

## 2022-03-23 DIAGNOSIS — M6281 Muscle weakness (generalized): Secondary | ICD-10-CM | POA: Diagnosis not present

## 2022-03-23 DIAGNOSIS — R252 Cramp and spasm: Secondary | ICD-10-CM | POA: Diagnosis not present

## 2022-03-23 NOTE — Therapy (Signed)
OUTPATIENT PHYSICAL THERAPY VESTIBULAR EVALUATION     Patient Name: Kimberly [name] MRN: [id number] DOB:[birth year], 71 y.o., female Today's Date: 03/23/2022   PT End of Session - 03/23/22 1103     Visit Number 16    Number of Visits --   8 visits POC for [facility]   Date for PT Re-Evaluation 04/20/22   For [facility]:  04/13/2022   Authorization Type Medicare B- Pt is at [place], visit 15, 03/22/2022    Progress Note Due on Visit 10    PT Start Time 1100    PT Stop Time 1145    PT Time Calculation (min) 45 min    Activity Tolerance Patient tolerated treatment well    Behavior During Therapy [facility] for tasks assessed/performed             Past Medical History:  Diagnosis Date   DDD (degenerative disc disease), lumbar    GERD (gastroesophageal reflux disease)    History of anemia    History of COVID-19    11-11-2020 positive result in care everywhere   History of DVT (deep vein thrombosis) 2003   per pt completed coumadin x 6 mon, never a blood clot prior to 2003 and none since   Hypertension    followed by pcp   (nuclear stress test in epic 05-13-2019, Low risk without ischemia, normal LV and wall motion , ef 68%)   Hypothyroidism    followed by pcp   Lattice degeneration of right retina    OA (osteoarthritis)    Osteopenia    PMB (postmenopausal bleeding)    Pre-diabetes    Vegetarian diet    eats fish and eggs; no meat   Past Surgical History:  Procedure Laterality Date   CATARACT EXTRACTION W/ INTRAOCULAR LENS IMPLANT Bilateral 07/2020   COLONOSCOPY  2016   DILATATION & CURETTAGE/HYSTEROSCOPY WITH MYOSURE N/A 03/27/2021   Procedure: DILATATION & CURETTAGE/HYSTEROSCOPY/Polypectomy WITH MYOSURE;  Surgeon: Azucena Fallen, MD;  Location: [facility];  Service: Gynecology;  Laterality: N/A;   KNEE ARTHROSCOPY Right 03/16/2003   OOPHORECTOMY Left 1999   benign cyst per pt   TOTAL KNEE ARTHROPLASTY Right 09/14/2014   Procedure: RIGHT TOTAL KNEE  ARTHROPLASTY;  Surgeon: Garald Balding, MD;  Location: [place];  Service: Orthopedics;  Laterality: Right;   TOTAL KNEE ARTHROPLASTY Left 11/20/2016   Procedure: LEFT TOTAL KNEE ARTHROPLASTY;  Surgeon: Garald Balding, MD;  Location: [place];  Service: Orthopedics;  Laterality: Left;   Patient Active Problem List   Diagnosis Date Noted   Low back pain 12/12/2021   Dyslipidemia 10/25/2021   Type 2 diabetes mellitus with hyperglycemia ([place]) 04/10/2021   Abnormal weight gain 07/04/2020   Epigastric pain 07/04/2020   Pruritus ani 07/04/2020   S/P total knee replacement using cement, left 11/20/2016   Osteopenia 02/28/2015   Left lumbar radiculitis 02/28/2015   Acute blood loss anemia 09/20/2014   Primary osteoarthritis of right knee 09/14/2014   Osteoarthritis of left knee 09/14/2014   Vitamin B 12 deficiency 07/29/2014   Obesity (BMI 30-39.9) 12/08/2013   Osteoarthritis, knee 05/16/7251   Metabolic syndrome 66/44/0347   DVT, HX OF 02/10/2010   Vitamin D deficiency 08/25/2007   Disorder of bone and cartilage 04/08/2007   Hypothyroidism 11/18/2006   ANEMIA-NOS 11/18/2006   Essential hypertension 11/18/2006   GERD 11/18/2006    PCP: Carolann Littler, MD REFERRING PROVIDER: Carolann Littler, MD  REFERRING DIAG: R42 (ICD-10-CM) - Vertigo   THERAPY DIAG:  Dizziness  and giddiness  Other low back pain  Muscle weakness (generalized)  ONSET DATE: 03/21/2022 (MD referral and onset of vertigo)  Rationale for Evaluation and Treatment: Rehabilitation  SUBJECTIVE:   SUBJECTIVE STATEMENT: Back is ok. Dizziness on the right seems a lot better, still feel it on the left.  Pt accompanied by: self  PERTINENT HISTORY: See PMH above  PAIN:  Are you having pain? Yes: NPRS scale: 1-2/10 Pain location: low back-PT at [facility] is addressing Pain description: See [facility] notes Aggravating factors: - Relieving factors: -  PRECAUTIONS: None  FALLS: Has patient fallen  in last 6 months? Yes. Number of falls 1  LIVING ENVIRONMENT: Lives with: lives with their spouse Lives in: House/apartment Stairs: to enter home  PLOF: Independent  PATIENT GOALS: To get rid of dizziness  OBJECTIVE:   TODAY'S TREATMENT: 03/23/22 Activity Comments  Positional tests -right/left roll test negative, some left upbeating noted with sit to supine -left dix-hallpike: no sign initially -right dix-hallpike: no symptoms, no nystagmus -left DH repeated: left upbeating  Left Epley maneuver   Left DH Left upbeating  Left Epley repeated (Possibly started right geotropic once right sidelying?)  Left DH Left upbeating  Left Epley (Once in the right sidelying position demonstrates right upbeating nystagmus?)  Left DH Left upbeating  Left Epley No additional nystagmus noted during maneuver this round  Discussion of relevant anatomy and self-monitoring for cataloging symptom provocation Verbalizes understanding      PATIENT SURVEYS:  FOTO 46 for intake; goal is 64  VESTIBULAR ASSESSMENT:  GENERAL OBSERVATION: No acute distress   SYMPTOM BEHAVIOR:  Subjective history: Started 3 days ago.  Rolling,   Non-Vestibular symptoms: headaches and fullness in R eye sometimes  Type of dizziness: Spinning/Vertigo  Frequency: whenever lying down  Duration: seconds-minutes  Aggravating factors: Induced by position change: lying supine, rolling to the right, and rolling to the left and Induced by motion: looking up at the ceiling and turning head quickly  Relieving factors: head stationary  Progression of symptoms: unchanged  OCULOMOTOR EXAM:  Ocular Alignment: abnormal and R eye lower than L  Ocular ROM: No Limitations and reports some double vision  Spontaneous Nystagmus: absent  Gaze-Induced Nystagmus: left beating with left gaze  Smooth Pursuits: intact  Saccades: intact     VESTIBULAR - OCULAR REFLEX:   Slow VOR: Normal  VOR Cancellation: Normal  Head-Impulse Test: HIT  Right: negative HIT Left: negative    POSITIONAL TESTING: Right Dix-Hallpike: upbeating, right nystagmus, c/o dizziness, and Duration: 15 sec Left Dix-Hallpike: upbeating, left nystagmus, c/o dizziness, and Duration: 11 sec Right Roll Test: geotropic nystagmus and Duration: 30 sec Left Roll Test: no nystagmus  VESTIBULAR TREATMENT:                                                                                                   DATE: 03/23/2022  Canalith Repositioning:  BBQ Roll Right: Number of Reps: 1, Response to Treatment: symptoms improved, and Comment: Did not complete again due to time constraints.  Minimal c/o symptoms upon return to sit.  *Pt is unable  to get to quadruped position, stayed in prone on elbows prior to return to sit  PATIENT EDUCATION: Education details: Eval results, POC, rationale for BPPV treatment Person educated: Patient Education method: Explanation Education comprehension: verbalized understanding  HOME EXERCISE PROGRAM:  GOALS: Goals reviewed with patient? Yes  SHORT TERM GOALS: Target date: = LTGs    LONG TERM GOALS: Target date: 04/13/2022  Pt will be independent with HEP for improved dizziness for improved bed mobility. Baseline:  Goal status: INITIAL  2.  Pt will perform bed mobility tasks without c/o dizziness. Baseline:  Goal status: INITIAL  3.  FOTO score for vertigo to improve to 64 to demonstrate improved dizziness and improved functional mobility. Baseline: 46 at intake Goal status: INITIAL    ASSESSMENT:  CLINICAL IMPRESSION: Session initiated with left and right log rolling which revealed no symptoms or nystagmus.  Proceeded with treatment of positional vertigo affecting the left ear requiring several trials of Dix-Hallpike and moving into Left Epley maneuver with some possible signs of left horizontal canal involvement as once in right sidelying demonstrated instances of geotropic nystagmus possibly implying left horizontal  canal involvement?  Discussed with pt in self-monitoring at home to determine positional provocation right vs left and quality of symptom (spinning vs falling) as patient demonstrates good understanding of these distinctions. Continued sessions to address these positional symptoms and hopefully resolution.   OBJECTIVE IMPAIRMENTS: dizziness.   ACTIVITY LIMITATIONS: bed mobility  PARTICIPATION LIMITATIONS:  therapy exercise for back  PERSONAL FACTORS: 3+ comorbidities: see above  are also affecting patient's functional outcome.   REHAB POTENTIAL: Good  CLINICAL DECISION MAKING: Stable/uncomplicated  EVALUATION COMPLEXITY: Low   PLAN:  PT FREQUENCY: 1-2x/week  PT DURATION: 4 weeks, including eval week  PLANNED INTERVENTIONS: Therapeutic exercises, Therapeutic activity, Neuromuscular re-education, Balance training, Gait training, Patient/Family education, Self Care, Vestibular training, and Canalith repositioning  PLAN FOR NEXT SESSION: Reasess positional BPPV testing and treat appropriate canals.  Initiate HEP as appropriate.   12:07 PM, 03/23/22 M. Sherlyn Lees, PT, DPT Physical Therapist- [place] Office Number: [phone]   [place] at [facility] [address], [place], [place] [address] Phone # [phone] Fax # [phone]

## 2022-03-26 ENCOUNTER — Ambulatory Visit: Payer: Medicare Other

## 2022-03-26 DIAGNOSIS — R252 Cramp and spasm: Secondary | ICD-10-CM

## 2022-03-26 DIAGNOSIS — M6281 Muscle weakness (generalized): Secondary | ICD-10-CM | POA: Diagnosis not present

## 2022-03-26 DIAGNOSIS — M5459 Other low back pain: Secondary | ICD-10-CM

## 2022-03-26 DIAGNOSIS — R42 Dizziness and giddiness: Secondary | ICD-10-CM | POA: Diagnosis not present

## 2022-03-26 NOTE — Therapy (Signed)
OUTPATIENT PHYSICAL THERAPY TREATMENT      Patient Name: Kimberly [name] MRN: [id number] DOB:21-Aug-1950, 71 y.o., female Today's Date: 03/26/2022     PT End of Session - 03/26/22 1137     Visit Number 17    Date for PT Re-Evaluation 04/20/22    Authorization Type Medicare B- Pt is at [place], visit 15, 03/22/2022    Progress Note Due on Visit 10    PT Start Time 1101    PT Stop Time 1136    PT Time Calculation (min) 35 min    Activity Tolerance Patient tolerated treatment well    Behavior During Therapy [facility] for tasks assessed/performed             PT End of Session - 03/26/22 1137     Visit Number 17    Date for PT Re-Evaluation 04/20/22    Authorization Type Medicare B- Pt is at [place], visit 15, 03/22/2022    Progress Note Due on Visit 10    PT Start Time 1101    PT Stop Time 1136    PT Time Calculation (min) 35 min    Activity Tolerance Patient tolerated treatment well    Behavior During Therapy WFL for tasks assessed/performed                           Past Medical History:  Diagnosis Date   DDD (degenerative disc disease), lumbar    GERD (gastroesophageal reflux disease)    History of anemia    History of COVID-19    11-11-2020 positive result in care everywhere   History of DVT (deep vein thrombosis) 2003   per pt completed coumadin x 6 mon, never a blood clot prior to 2003 and none since   Hypertension    followed by pcp   (nuclear stress test in epic 05-13-2019, Low risk without ischemia, normal LV and wall motion , ef 68%)   Hypothyroidism    followed by pcp   Lattice degeneration of right retina    OA (osteoarthritis)    Osteopenia    PMB (postmenopausal bleeding)    Pre-diabetes    Vegetarian diet    eats fish and eggs; no meat   Past Surgical History:  Procedure Laterality Date   CATARACT EXTRACTION W/ INTRAOCULAR LENS IMPLANT Bilateral 07/2020   COLONOSCOPY  2016   DILATATION & CURETTAGE/HYSTEROSCOPY WITH MYOSURE N/A 03/27/2021    Procedure: DILATATION & CURETTAGE/HYSTEROSCOPY/Polypectomy WITH MYOSURE;  Surgeon: Azucena Fallen, MD;  Location: [facility];  Service: Gynecology;  Laterality: N/A;   KNEE ARTHROSCOPY Right 03/16/2003   OOPHORECTOMY Left 1999   benign cyst per pt   TOTAL KNEE ARTHROPLASTY Right 09/14/2014   Procedure: RIGHT TOTAL KNEE ARTHROPLASTY;  Surgeon: Garald Balding, MD;  Location: [place];  Service: Orthopedics;  Laterality: Right;   TOTAL KNEE ARTHROPLASTY Left 11/20/2016   Procedure: LEFT TOTAL KNEE ARTHROPLASTY;  Surgeon: Garald Balding, MD;  Location: [place];  Service: Orthopedics;  Laterality: Left;   Patient Active Problem List   Diagnosis Date Noted   Low back pain 12/12/2021   Dyslipidemia 10/25/2021   Type 2 diabetes mellitus with hyperglycemia ([place]) 04/10/2021   Abnormal weight gain 07/04/2020   Epigastric pain 07/04/2020   Pruritus ani 07/04/2020   S/P total knee replacement using cement, left 11/20/2016   Osteopenia 02/28/2015   Left lumbar radiculitis 02/28/2015   Acute blood loss anemia 09/20/2014   Primary osteoarthritis of  right knee 09/14/2014   Osteoarthritis of left knee 09/14/2014   Vitamin B 12 deficiency 07/29/2014   Obesity (BMI 30-39.9) 12/08/2013   Osteoarthritis, knee 84/53/6468   Metabolic syndrome 08/02/2246   DVT, HX OF 02/10/2010   Vitamin D deficiency 08/25/2007   Disorder of bone and cartilage 04/08/2007   Hypothyroidism 11/18/2006   ANEMIA-NOS 11/18/2006   Essential hypertension 11/18/2006   GERD 11/18/2006    PCP: Carolann Littler, MD  REFERRING PROVIDER: [name], MD  REFERRING DIAG: [phone] (ICD-10-CM) - Acute left-sided low back pain with left-sided sciatica  Rationale for Evaluation and Treatment Rehabilitation  THERAPY DIAG:  Other low back pain  Muscle weakness (generalized)  Cramp and spasm  ONSET DATE: 1.5 months ago  SUBJECTIVE:                                                                                                                                                                                            SUBJECTIVE STATEMENT: The treatment for my vertigo is really helping.  My back is feeling so much better overall.    PERTINENT HISTORY:  Bil TKAs  PAIN:  Are you having pain? Not right now: NPRS scale: 2/10 Pain location: Pain description: Lt gluteal Aggravating factors: standing, sleeping wrong  Relieving factors: sitting on donut pillow, sometimes pain medication  Walking is limited to 1.5 miles (3 miles is goal)  PRECAUTIONS: None  WEIGHT BEARING RESTRICTIONS No  FALLS:  Has patient fallen in last 6 months? Yes. Number of falls 1.  Fell down the steps while looking at watch. No balance deficits overall.   LIVING ENVIRONMENT: Lives with: lives with their family Lives in: House/apartment Stairs: Yes: External from garage.    OCCUPATION: retired   PLOF: Independent  PATIENT GOALS reduce LBP  OBJECTIVE:   DIAGNOSTIC FINDINGS:  From MD note: The last MRI was performed in 2021 through the [facility].  She had multilevel mild to moderate degenerative changes and moderate spinal stenosis at L2-3 secondary to disc bulge and facet arthropathy.    PATIENT SURVEYS:  FOTO 60 (goal is 10) FOTO 62 03/05/22  SCREENING FOR RED FLAGS: Bowel or bladder incontinence: No Spinal tumors: No Cauda equina syndrome: No Compression fracture: No Abdominal aneurysm: No  COGNITION:  Overall cognitive status: Within functional limits for tasks assessed     SENSATION: WFL  MUSCLE LENGTH: Hamstring length is limited by 25% with Lt LE pain reported at end range  POSTURE: rounded shoulders, forward head, and flexed trunk   PALPATION: Tension in bil lumbar paraspinals with pain L3-5 bilaterally, trigger points   LUMBAR ROM:  Full  with limited lumbar segmental mobility.  Pain with Lt sidebending and lumbar flexion.   LOWER EXTREMITY ROM:    Hip flexibility  limited by 25% in hip flexion and 50% in IR/ER  LOWER EXTREMITY MMT:   Bil hip strength 4/5, knee 4+/5 02/28/22: Bil hip flexors 4+/5, all others 4/5, quads 4+/5: tested in pool    LUMBAR SPECIAL TESTS:  Straight leg raise test: Negative  GAIT: Distance walked: 50 Assistive device utilized: None Level of assistance: Complete Independence Comments: increased trunk rotation, flexed trunk  TODAY'S TREATMENT  03/26/22: Nustep L5 x 17mn PT present to discuss progress. Sit to stand holding 10# KB 2x15 Seated hamstring stretch  3x 20 seconds  Supine SLR 2.5# with core activation 10x Bil Sidelying hip abduction 2.5# 10x Bil Supine: ball squeezes and hip abduction with yellow band 2x10   03/21/22: Nustep L5 x 157m PT present to discuss progress. Sit to stand holding 10# KB 2x15 Seated hamstring stretch  3x 20 seconds  Supine SLR 2.5# with core activation 10x Bil Sidelying hip abduction 2.5# 10x Bil Trigger Point Dry-Needling  Treatment instructions: Expect mild to moderate muscle soreness. S/S of pneumothorax if dry needled over a lung field, and to seek immediate medical attention should they occur. Patient verbalized understanding of these instructions and education.  Patient Consent Given: Yes Education handout provided: Previously provided Muscles treated: bil lumbar multfidi and gluteals  Treatment response/outcome: Utilized skilled palpation to identify trigger points.  During dry needling able to palpate muscle twitch and muscle elongation  Elongation and release after needling  Skilled palpation and monitoring by PT during dry needling   03/14/22: Nustep L5 x 1045mPT present to discuss progress. Sit to stand holding 10# KB 2x15 Seated hamstring stretch  3x 20 seconds  Supine SLR 2.5# with core activation 10x Bil Sidelying hip abduction 2.5# 10x Bil Trigger Point Dry-Needling  Treatment instructions: Expect mild to moderate muscle soreness. S/S of pneumothorax if dry  needled over a lung field, and to seek immediate medical attention should they occur. Patient verbalized understanding of these instructions and education.  Patient Consent Given: Yes Education handout provided: Previously provided Muscles treated: bil lumbar multfidi and gluteals  Treatment response/outcome: Utilized skilled palpation to identify trigger points.  During dry needling able to palpate muscle twitch and muscle elongation  Elongation and release after needling  Skilled palpation and monitoring by PT during dry needling    PATIENT EDUCATION:  Education details: Access Code: [email or web address], DN info Person educated: Patient Education method: Explanation, Demonstration, and Handouts Education comprehension: verbalized understanding and returned demonstration   HOME EXERCISE PROGRAM: Access Code: [email or web address] Az Endoscopy Asc LLCL: [email or web address] Date: 02/07/2022 Prepared by: KelClaiborne Billingsxercises - Supine Lower Trunk Rotation  - 3 x daily - 7 x weekly - 1 sets - 3 reps - 20 hold - Hooklying Single Knee to Chest  - 3 x daily - 7 x weekly - 1 sets - 3 reps - 20 hold - Seated Hamstring Stretch  - 3 x daily - 7 x weekly - 1 sets - 3 reps - 20 hold - Seated Piriformis Stretch with Trunk Bend  - 3 x daily - 7 x weekly - 1 sets - 3 reps - 20 hold - Clamshell  - 1 x daily - 7 x weekly - 2 sets - 10 reps - Sit to Stand Without Arm Support  - 1 x daily - 7 x weekly - 2 sets - 10 reps - Supine Hip Adduction Isometric with  Ball  - 1 x daily - 7 x weekly - 2 sets - 10 reps - 5 hold - Supine Active Straight Leg Raise  - 1 x daily - 7 x weekly - 1 sets - 10 reps - Sidelying Hip Abduction  - 1 x daily - 7 x weekly - 1 sets - 10 reps - Supine Bridge  - 1 x daily - 7 x weekly - 1 sets - 10 reps ASSESSMENT:  CLINICAL IMPRESSION: Pt with 60-70% improvement in symptoms overall.  Pt has been active with her HEP and gym exercises.  She will have 1 more aquatic and 1 more land based treatment and will  likely D/C to HEP and independence with aquatics.  Patient will benefit from skilled PT to address the below impairments and improve overall function.   OBJECTIVE IMPAIRMENTS decreased activity tolerance, difficulty walking, decreased ROM, decreased strength, increased muscle spasms, impaired flexibility, postural dysfunction, and pain.   ACTIVITY LIMITATIONS sitting, standing, squatting, and locomotion level  PARTICIPATION LIMITATIONS: meal prep, cleaning, laundry, shopping, and community activity  PERSONAL FACTORS Age and 1-2 comorbidities: 2 TKAs, chronic LBP  are also affecting patient's functional outcome.   REHAB POTENTIAL: Good  CLINICAL DECISION MAKING: Stable/uncomplicated  EVALUATION COMPLEXITY: Low   GOALS: Goals reviewed with patient? Yes  SHORT TERM GOALS: Target date: 02/07/2022  Be independent in initial HEP Baseline: Goal status: MET  2.  Report > or = to 30% reduction in LBP with standing and sitting  Baseline: 25-30% (02/07/22) Goal status: Goal met 02/28/22 75%  3.  Walk > or = to 2 miles for exercise without limitation due to LBP Baseline: 2.30 yesterday Goal status: met  LONG TERM GOALS: Target date: 04/20/22  Be independent in advanced HEP including aquatics exercises for independence after D/C  Baseline: independent in land based exercise and needs further instruction in aquatics Goal status: On going  2.  Reduce FOTO to < or = to 71 Baseline: 62 (03/05/22) Goal status: In progress   3.  Verbalize and demonstrate body mechanics modifications for lumbar protection with ADLs and self-care Baseline:  Goal status: MET  4.  Report > or = to 70% reduction in LBP with standing and sitting  Baseline: 60-70% (03/26/22) Goal status: In progress   5.  Return to regular walking for exercise up to 3 miles without limitation due to LBP Baseline: daily 1-2 miles (03/21/22) Goal status: In progress    PLAN: PT FREQUENCY: 1-2x/week  PT DURATION: 6 weeks    PLANNED INTERVENTIONS: Therapeutic exercises, Therapeutic activity, Neuromuscular re-education, Balance training, Gait training, Patient/Family education, Self Care, Joint mobilization, Stair training, Aquatic Therapy, Dry Needling, Electrical stimulation, Cryotherapy, Moist heat, Taping, and Manual therapy.  PLAN FOR NEXT SESSION: Aquatics next.  Pt will continue with treatment for vertigo as needed.    Sigurd Sos, PT 03/26/22 11:38 AM   [facility] [address] [place], [place] [address] Phone # [phone] Fax [phone]

## 2022-03-27 NOTE — Therapy (Unsigned)
OUTPATIENT PHYSICAL THERAPY TREATMENT      Patient Name: Kimberly [name] MRN: [id number] DOB:Oct 17, 1950, 71 y.o., female Today's Date: 03/28/2022     PT End of Session - 03/28/22 0931     Visit Number 18    Date for PT Re-Evaluation 04/20/22    Authorization Type Medicare B- Pt is at [place], visit 15, 03/22/2022    Progress Note Due on Visit 10    PT Start Time 0930    PT Stop Time 1010    PT Time Calculation (min) 40 min    Activity Tolerance Patient tolerated treatment well    Behavior During Therapy [facility] for tasks assessed/performed                  Past Surgical History:  Procedure Laterality Date   CATARACT EXTRACTION W/ INTRAOCULAR LENS IMPLANT Bilateral 07/2020   COLONOSCOPY  2016   DILATATION & CURETTAGE/HYSTEROSCOPY WITH MYOSURE N/A 03/27/2021   Procedure: DILATATION & CURETTAGE/HYSTEROSCOPY/Polypectomy WITH MYOSURE;  Surgeon: Azucena Fallen, MD;  Location: [facility];  Service: Gynecology;  Laterality: N/A;   KNEE ARTHROSCOPY Right 03/16/2003   OOPHORECTOMY Left 1999   benign cyst per pt   TOTAL KNEE ARTHROPLASTY Right 09/14/2014   Procedure: RIGHT TOTAL KNEE ARTHROPLASTY;  Surgeon: Garald Balding, MD;  Location: [place];  Service: Orthopedics;  Laterality: Right;   TOTAL KNEE ARTHROPLASTY Left 11/20/2016   Procedure: LEFT TOTAL KNEE ARTHROPLASTY;  Surgeon: Garald Balding, MD;  Location: [place];  Service: Orthopedics;  Laterality: Left;   Patient Active Problem List   Diagnosis Date Noted   Low back pain 12/12/2021   Dyslipidemia 10/25/2021   Type 2 diabetes mellitus with hyperglycemia ([place]) 04/10/2021   Abnormal weight gain 07/04/2020   Epigastric pain 07/04/2020   Pruritus ani 07/04/2020   S/P total knee replacement using cement, left 11/20/2016   Osteopenia 02/28/2015   Left lumbar radiculitis 02/28/2015   Acute blood loss anemia 09/20/2014   Primary osteoarthritis of right knee 09/14/2014   Osteoarthritis of left knee 09/14/2014    Vitamin B 12 deficiency 07/29/2014   Obesity (BMI 30-39.9) 12/08/2013   Osteoarthritis, knee 25/95/6387   Metabolic syndrome 56/43/3295   DVT, HX OF 02/10/2010   Vitamin D deficiency 08/25/2007   Disorder of bone and cartilage 04/08/2007   Hypothyroidism 11/18/2006   ANEMIA-NOS 11/18/2006   Essential hypertension 11/18/2006   GERD 11/18/2006    PCP: Carolann Littler, MD  REFERRING PROVIDER: [name], MD  REFERRING DIAG: [phone] (ICD-10-CM) - Acute left-sided low back pain with left-sided sciatica  Rationale for Evaluation and Treatment Rehabilitation  THERAPY DIAG:  Other low back pain  Cramp and spasm  Muscle weakness (generalized)  ONSET DATE: 1.5 months ago  SUBJECTIVE:  SUBJECTIVE STATEMENT: I have decided to not join [employer] at this time, it is too expensive although the water feels so good on my back.    PERTINENT HISTORY:  Bil TKAs  PAIN:  Are you having pain? Not right now: NPRS scale: 1/10 Pain location: Pain description: Lt gluteal Aggravating factors: standing, sleeping wrong  Relieving factors: sitting on donut pillow, sometimes pain medication  Walking is limited to 1.5 miles (3 miles is goal)  PRECAUTIONS: None  WEIGHT BEARING RESTRICTIONS No  FALLS:  Has patient fallen in last 6 months? Yes. Number of falls 1.  Fell down the steps while looking at watch. No balance deficits overall.   LIVING ENVIRONMENT: Lives with: lives with their family Lives in: House/apartment Stairs: Yes: External from garage.    OCCUPATION: retired   PLOF: Independent  PATIENT GOALS reduce LBP  OBJECTIVE:   DIAGNOSTIC FINDINGS:  From MD note: The last MRI was performed in 2021 through the [facility].  She had multilevel mild to moderate degenerative changes and  moderate spinal stenosis at L2-3 secondary to disc bulge and facet arthropathy.    PATIENT SURVEYS:  FOTO 60 (goal is 23) FOTO 62 03/05/22  SCREENING FOR RED FLAGS: Bowel or bladder incontinence: No Spinal tumors: No Cauda equina syndrome: No Compression fracture: No Abdominal aneurysm: No  COGNITION:  Overall cognitive status: Within functional limits for tasks assessed     SENSATION: WFL  MUSCLE LENGTH: Hamstring length is limited by 25% with Lt LE pain reported at end range  POSTURE: rounded shoulders, forward head, and flexed trunk   PALPATION: Tension in bil lumbar paraspinals with pain L3-5 bilaterally, trigger points   LUMBAR ROM:  Full with limited lumbar segmental mobility.  Pain with Lt sidebending and lumbar flexion.   LOWER EXTREMITY ROM:    Hip flexibility limited by 25% in hip flexion and 50% in IR/ER  LOWER EXTREMITY MMT:   Bil hip strength 4/5, knee 4+/5 02/28/22: Bil hip flexors 4+/5, all others 4/5, quads 4+/5: tested in pool    LUMBAR SPECIAL TESTS:  Straight leg raise test: Negative  GAIT: Distance walked: 50 Assistive device utilized: None Level of assistance: Complete Independence Comments: increased trunk rotation, flexed trunk  TODAY'S TREATMENT   03/28/22:Pt arrives for aquatic physical therapy. Treatment took place in 3.5-5.5 feet of water. Water temperature was 92 degrees F. Pt entered the pool via stairs with mild use of rails step to step.  Seated water bench with 75% submersion Pt performed seated LE AROM exercises 20x in all planes,  75% water depth for water walking 6x in each direction with small noodle for postural support. Pt a little apprehensive at first.  Standing hip kicks 10x each direction with 2 finger for for balance support.  Single buoy lat/core press standing against wall 2x10 with single knee to chest stretch in between sets.   03/26/22: Nustep L5 x 62mn PT present to discuss progress. Sit to stand holding 10#  KB 2x15 Seated hamstring stretch  3x 20 seconds  Supine SLR 2.5# with core activation 10x Bil Sidelying hip abduction 2.5# 10x Bil Supine: ball squeezes and hip abduction with yellow band 2x10   03/21/22: Nustep L5 x 146m PT present to discuss progress. Sit to stand holding 10# KB 2x15 Seated hamstring stretch  3x 20 seconds  Supine SLR 2.5# with core activation 10x Bil Sidelying hip abduction 2.5# 10x Bil Trigger Point Dry-Needling  Treatment instructions: Expect mild to moderate muscle soreness. S/S of  pneumothorax if dry needled over a lung field, and to seek immediate medical attention should they occur. Patient verbalized understanding of these instructions and education.  Patient Consent Given: Yes Education handout provided: Previously provided Muscles treated: bil lumbar multfidi and gluteals  Treatment response/outcome: Utilized skilled palpation to identify trigger points.  During dry needling able to palpate muscle twitch and muscle elongation  Elongation and release after needling  Skilled palpation and monitoring by PT during dry needling   PATIENT EDUCATION:  Education details: Access Code: [email or web address], DN info Person educated: Patient Education method: Explanation, Demonstration, and Handouts Education comprehension: verbalized understanding and returned demonstration   HOME EXERCISE PROGRAM: Access Code: [facility] URL: [email or web address] Date: 02/07/2022 Prepared by: Claiborne Billings  Exercises - Supine Lower Trunk Rotation  - 3 x daily - 7 x weekly - 1 sets - 3 reps - 20 hold - Hooklying Single Knee to Chest  - 3 x daily - 7 x weekly - 1 sets - 3 reps - 20 hold - Seated Hamstring Stretch  - 3 x daily - 7 x weekly - 1 sets - 3 reps - 20 hold - Seated Piriformis Stretch with Trunk Bend  - 3 x daily - 7 x weekly - 1 sets - 3 reps - 20 hold - Clamshell  - 1 x daily - 7 x weekly - 2 sets - 10 reps - Sit to Stand Without Arm Support  - 1 x daily - 7 x weekly - 2  sets - 10 reps - Supine Hip Adduction Isometric with Ball  - 1 x daily - 7 x weekly - 2 sets - 10 reps - 5 hold - Supine Active Straight Leg Raise  - 1 x daily - 7 x weekly - 1 sets - 10 reps - Sidelying Hip Abduction  - 1 x daily - 7 x weekly - 1 sets - 10 reps - Supine Bridge  - 1 x daily - 7 x weekly - 1 sets - 10 reps ASSESSMENT:  CLINICAL IMPRESSION: Pt arrives with very little pain. She reports her vertigo has been stable. Pt has decided to not join [employer] do to expenses. She will continue with her HEP and exercising at the [facility].    OBJECTIVE IMPAIRMENTS decreased activity tolerance, difficulty walking, decreased ROM, decreased strength, increased muscle spasms, impaired flexibility, postural dysfunction, and pain.   ACTIVITY LIMITATIONS sitting, standing, squatting, and locomotion level  PARTICIPATION LIMITATIONS: meal prep, cleaning, laundry, shopping, and community activity  PERSONAL FACTORS Age and 1-2 comorbidities: 2 TKAs, chronic LBP  are also affecting patient's functional outcome.   REHAB POTENTIAL: Good  CLINICAL DECISION MAKING: Stable/uncomplicated  EVALUATION COMPLEXITY: Low   GOALS: Goals reviewed with patient? Yes  SHORT TERM GOALS: Target date: 02/07/2022  Be independent in initial HEP Baseline: Goal status: MET  2.  Report > or = to 30% reduction in LBP with standing and sitting  Baseline: 25-30% (02/07/22) Goal status: Goal met 02/28/22 75%  3.  Walk > or = to 2 miles for exercise without limitation due to LBP Baseline: 2.30 yesterday Goal status: met  LONG TERM GOALS: Target date: 04/20/22  Be independent in advanced HEP including aquatics exercises for independence after D/C  Baseline: independent in land based exercise and needs further instruction in aquatics Goal status: On going  2.  Reduce FOTO to < or = to 71 Baseline: 62 (03/05/22) Goal status: In progress   3.  Verbalize and demonstrate body mechanics modifications for lumbar  protection with  ADLs and self-care Baseline:  Goal status: MET  4.  Report > or = to 70% reduction in LBP with standing and sitting  Baseline: 60-70% (03/26/22) Goal status: In progress   5.  Return to regular walking for exercise up to 3 miles without limitation due to LBP Baseline: daily 1-2 miles (03/21/22) Goal status: In progress    PLAN: PT FREQUENCY: 1-2x/week  PT DURATION: 6 weeks   PLANNED INTERVENTIONS: Therapeutic exercises, Therapeutic activity, Neuromuscular re-education, Balance training, Gait training, Patient/Family education, Self Care, Joint mobilization, Stair training, Aquatic Therapy, Dry Needling, Electrical stimulation, Cryotherapy, Moist heat, Taping, and Manual therapy.  PLAN FOR NEXT SESSION: Aquatics next.  Pt will continue with treatment for vertigo as needed.    Myrene Galas, PTA 03/28/22 8:24 PM    [facility] [address] [place], [place] [address] Phone # [phone] Fax [phone]

## 2022-03-28 ENCOUNTER — Ambulatory Visit: Payer: Medicare Other | Admitting: Physical Therapy

## 2022-03-28 ENCOUNTER — Encounter: Payer: Self-pay | Admitting: Physical Therapy

## 2022-03-28 DIAGNOSIS — M6281 Muscle weakness (generalized): Secondary | ICD-10-CM | POA: Diagnosis not present

## 2022-03-28 DIAGNOSIS — M5459 Other low back pain: Secondary | ICD-10-CM | POA: Diagnosis not present

## 2022-03-28 DIAGNOSIS — R252 Cramp and spasm: Secondary | ICD-10-CM

## 2022-03-28 DIAGNOSIS — R42 Dizziness and giddiness: Secondary | ICD-10-CM | POA: Diagnosis not present

## 2022-03-29 ENCOUNTER — Encounter: Payer: Self-pay | Admitting: Physical Therapy

## 2022-03-29 ENCOUNTER — Other Ambulatory Visit: Payer: Self-pay | Admitting: Family Medicine

## 2022-03-29 ENCOUNTER — Ambulatory Visit: Payer: Medicare Other | Admitting: Physical Therapy

## 2022-03-29 DIAGNOSIS — M5459 Other low back pain: Secondary | ICD-10-CM | POA: Diagnosis not present

## 2022-03-29 DIAGNOSIS — R42 Dizziness and giddiness: Secondary | ICD-10-CM | POA: Diagnosis not present

## 2022-03-29 DIAGNOSIS — R252 Cramp and spasm: Secondary | ICD-10-CM | POA: Diagnosis not present

## 2022-03-29 DIAGNOSIS — M6281 Muscle weakness (generalized): Secondary | ICD-10-CM | POA: Diagnosis not present

## 2022-03-29 NOTE — Therapy (Signed)
OUTPATIENT PHYSICAL THERAPY VESTIBULAR TREATMENT NOTES     Patient Name: Kimberly [name] MRN: [id number] DOB:11/26/1950, 71 y.o., female Today's Date: 03/29/2022   PT End of Session - 03/29/22 0933     Visit Number 83   3 for [facility]   Date for PT Re-Evaluation 04/20/22    Authorization Type Medicare B- Pt is at [place], visit 15, 03/22/2022    Progress Note Due on Visit 83    PT Start Time 0854    PT Stop Time 0930    PT Time Calculation (min) 36 min    Activity Tolerance Patient tolerated treatment well    Behavior During Therapy [facility] for tasks assessed/performed             Past Medical History:  Diagnosis Date   DDD (degenerative disc disease), lumbar    GERD (gastroesophageal reflux disease)    History of anemia    History of COVID-19    11-11-2020 positive result in care everywhere   History of DVT (deep vein thrombosis) 2003   per pt completed coumadin x 6 mon, never a blood clot prior to 2003 and none since   Hypertension    followed by pcp   (nuclear stress test in epic 05-13-2019, Low risk without ischemia, normal LV and wall motion , ef 68%)   Hypothyroidism    followed by pcp   Lattice degeneration of right retina    OA (osteoarthritis)    Osteopenia    PMB (postmenopausal bleeding)    Pre-diabetes    Vegetarian diet    eats fish and eggs; no meat   Past Surgical History:  Procedure Laterality Date   CATARACT EXTRACTION W/ INTRAOCULAR LENS IMPLANT Bilateral 07/2020   COLONOSCOPY  2016   DILATATION & CURETTAGE/HYSTEROSCOPY WITH MYOSURE N/A 03/27/2021   Procedure: DILATATION & CURETTAGE/HYSTEROSCOPY/Polypectomy WITH MYOSURE;  Surgeon: Azucena Fallen, MD;  Location: [facility];  Service: Gynecology;  Laterality: N/A;   KNEE ARTHROSCOPY Right 03/16/2003   OOPHORECTOMY Left 1999   benign cyst per pt   TOTAL KNEE ARTHROPLASTY Right 09/14/2014   Procedure: RIGHT TOTAL KNEE ARTHROPLASTY;  Surgeon: Garald Balding, MD;  Location: [place];   Service: Orthopedics;  Laterality: Right;   TOTAL KNEE ARTHROPLASTY Left 11/20/2016   Procedure: LEFT TOTAL KNEE ARTHROPLASTY;  Surgeon: Garald Balding, MD;  Location: [place];  Service: Orthopedics;  Laterality: Left;   Patient Active Problem List   Diagnosis Date Noted   Low back pain 12/12/2021   Dyslipidemia 10/25/2021   Type 2 diabetes mellitus with hyperglycemia ([place]) 04/10/2021   Abnormal weight gain 07/04/2020   Epigastric pain 07/04/2020   Pruritus ani 07/04/2020   S/P total knee replacement using cement, left 11/20/2016   Osteopenia 02/28/2015   Left lumbar radiculitis 02/28/2015   Acute blood loss anemia 09/20/2014   Primary osteoarthritis of right knee 09/14/2014   Osteoarthritis of left knee 09/14/2014   Vitamin B 12 deficiency 07/29/2014   Obesity (BMI 30-39.9) 12/08/2013   Osteoarthritis, knee 88/41/6606   Metabolic syndrome 30/16/0109   DVT, HX OF 02/10/2010   Vitamin D deficiency 08/25/2007   Disorder of bone and cartilage 04/08/2007   Hypothyroidism 11/18/2006   ANEMIA-NOS 11/18/2006   Essential hypertension 11/18/2006   GERD 11/18/2006    PCP: Carolann Littler, MD REFERRING PROVIDER: Carolann Littler, MD  REFERRING DIAG: R42 (ICD-10-CM) - Vertigo   THERAPY DIAG:  Dizziness and giddiness  ONSET DATE: 03/21/2022 (MD referral and onset of vertigo)  Rationale  for Evaluation and Treatment: Rehabilitation  SUBJECTIVE:   SUBJECTIVE STATEMENT: Feel about 80-90% better.  Last night trying to roll to the left, it felt like the spinning was coming on, just for about 5 seconds.  Pt accompanied by: self  PERTINENT HISTORY: See PMH above  PAIN:  Are you having pain? Yes: NPRS scale: 1-2/10 Pain location: low back-PT at [facility] is addressing Pain description: See [facility] Special[facility] notes Aggravating factors: - Relieving factors: -  PRECAUTIONS: None  FALLS: Has patient fallen in last 6 months? Yes. Number of falls 1  LIVING ENVIRONMENT: Lives  with: lives with their spouse Lives in: House/apartment Stairs: to enter home  PLOF: Independent  PATIENT GOALS: To get rid of dizziness  OBJECTIVE:    TODAY'S TREATMENT: 03/29/2022 Activity Comments  Positional tests -Right Roll test-mild c/o dizziness, minimal nystagmus -Left roll test -mild c/o dizziness and minimal nystagmus noted -Left dix-hallpike: L upbeating nystagmus, 10-15 seconds -Right dix-hallpike: no symptoms, no nystagmus   L Epley manuever Mild reports of dizziness in R head turn position, no reports dizziness in positions 3 and 4 with return to sit  L Epley -repeated 2nd rep Nystagmus noted upbeating in poistion 1, c/o symptoms position 3.  Feels mild dizziness  Attempted brandt-Daroff to L, with 10-15 seconds nystagmus and dizziness noted   L DH-negative, performed x 2 at end of session Slowed motion into position-no nystagmus, no dizziness      PATIENT EDUCATION: Education details:  information/education on positional vertigo-for her seems speed dependent with motion into L DH position (does not come on with slowed motion).  Educated to stay well hydrated Person educated: Patient Education method: Explanation Education comprehension: verbalized understanding   TREATMENT: 03/23/22 Activity Comments  Positional tests -right/left roll test negative, some left upbeating noted with sit to supine -left dix-hallpike: no sign initially -right dix-hallpike: no symptoms, no nystagmus -left DH repeated: left upbeating  Left Epley maneuver   Left DH Left upbeating  Left Epley repeated (Possibly started right geotropic once right sidelying?)  Left DH Left upbeating  Left Epley (Once in the right sidelying position demonstrates right upbeating nystagmus?)  Left DH Left upbeating  Left Epley No additional nystagmus noted during maneuver this round  Discussion of relevant anatomy and self-monitoring for cataloging symptom provocation Verbalizes understanding       PATIENT SURVEYS:  FOTO 46 for intake; goal is 64  VESTIBULAR ASSESSMENT:  GENERAL OBSERVATION: No acute distress   SYMPTOM BEHAVIOR:  Subjective history: Started 3 days ago.  Rolling,   Non-Vestibular symptoms: headaches and fullness in R eye sometimes  Type of dizziness: Spinning/Vertigo  Frequency: whenever lying down  Duration: seconds-minutes  Aggravating factors: Induced by position change: lying supine, rolling to the right, and rolling to the left and Induced by motion: looking up at the ceiling and turning head quickly  Relieving factors: head stationary  Progression of symptoms: unchanged  OCULOMOTOR EXAM:  Ocular Alignment: abnormal and R eye lower than L  Ocular ROM: No Limitations and reports some double vision  Spontaneous Nystagmus: absent  Gaze-Induced Nystagmus: left beating with left gaze  Smooth Pursuits: intact  Saccades: intact     VESTIBULAR - OCULAR REFLEX:   Slow VOR: Normal  VOR Cancellation: Normal  Head-Impulse Test: HIT Right: negative HIT Left: negative    POSITIONAL TESTING: Right Dix-Hallpike: upbeating, right nystagmus, c/o dizziness, and Duration: 15 sec Left Dix-Hallpike: upbeating, left nystagmus, c/o dizziness, and Duration: 11 sec Right Roll Test: geotropic nystagmus and  Duration: 30 sec Left Roll Test: no nystagmus  VESTIBULAR TREATMENT:                                                                                                   DATE: 03/23/2022  Canalith Repositioning:  BBQ Roll Right: Number of Reps: 1, Response to Treatment: symptoms improved, and Comment: Did not complete again due to time constraints.  Minimal c/o symptoms upon return to sit.  *Pt is unable to get to quadruped position, stayed in prone on elbows prior to return to sit  PATIENT EDUCATION: Education details: Eval results, POC, rationale for BPPV treatment Person educated: Patient Education method: Explanation Education comprehension: verbalized  understanding  HOME EXERCISE PROGRAM:  GOALS: Goals reviewed with patient? Yes  SHORT TERM GOALS: Target date: = LTGs    LONG TERM GOALS: Target date: 04/13/2022  Pt will be independent with HEP for improved dizziness for improved bed mobility. Baseline:  Goal status: INITIAL  2.  Pt will perform bed mobility tasks without c/o dizziness. Baseline:  Goal status: INITIAL  3.  FOTO score for vertigo to improve to 64 to demonstrate improved dizziness and improved functional mobility. Baseline: 46 at intake Goal status: INITIAL    ASSESSMENT:  CLINICAL IMPRESSION: Pt arrives to session today reporting overall improvements in vertigo, has gone several days with no difficulties, but last night with rolling to L, she notes spinning sensation.  Assessed for positional vertigo again today, with pt noting some mild dizziness and minimal nystagmus noted with R and L roll tests.  However, most provocation of symptoms and most notable symptoms are in L Dix-Hallpike, indicating L posterior canal involvement.  Performed L Epley maneuver x 2 reps, and trialed Brandt-Daroff to L, which also brought on symptoms.  Since pt is having some symptoms with horizontal canal testing, did not want to have pt do Brandt-Daroff at home yet, to prevent more multi-canal involvement.  With L DH testing at end of session today, pt has no symptoms, no nystagmus.  Will continue to assess, but do think that pt's slowed speed of movements at times may be preventing nystagmus from coming on.   OBJECTIVE IMPAIRMENTS: dizziness.   ACTIVITY LIMITATIONS: bed mobility  PARTICIPATION LIMITATIONS:  therapy exercise for back  PERSONAL FACTORS: 3+ comorbidities: see above  are also affecting patient's functional outcome.   REHAB POTENTIAL: Good  CLINICAL DECISION MAKING: Stable/uncomplicated  EVALUATION COMPLEXITY: Low   PLAN:  PT FREQUENCY: 1-2x/week  PT DURATION: 4 weeks, including eval week  PLANNED  INTERVENTIONS: Therapeutic exercises, Therapeutic activity, Neuromuscular re-education, Balance training, Gait training, Patient/Family education, Self Care, Vestibular training, and Canalith repositioning  PLAN FOR NEXT SESSION: Reassess positional BPPV testing and treat appropriate canals.  Initiate HEP as appropriate.   Mady Haagensen, PT 03/29/22 9:34 AM Phone: [phone] Fax: [phone]   [facility] at [facility], [place], [place] [address] Phone # [phone] Fax # [phone]

## 2022-04-02 NOTE — Therapy (Signed)
OUTPATIENT PHYSICAL THERAPY VESTIBULAR TREATMENT NOTES     Patient Name: Kimberly [name] MRN: [id number] DOB:[birth year], 71 y.o., female Today's Date: 04/03/2022   PT End of Session - 04/03/22 1611     Visit Number 4   neuro   Number of Visits 8   [place] POC   Date for PT Re-Evaluation 04/20/22    Authorization Type Medicare B- Pt is at [place], visit 15, 03/22/2022    Progress Note Due on Visit 1    PT Start Time 1532    PT Stop Time 1611    PT Time Calculation (min) 39 min    Equipment Utilized During Treatment Gait belt    Activity Tolerance Patient tolerated treatment well    Behavior During Therapy WFL for tasks assessed/performed              Past Medical History:  Diagnosis Date   DDD (degenerative disc disease), lumbar    GERD (gastroesophageal reflux disease)    History of anemia    History of COVID-19    11-11-2020 positive result in care everywhere   History of DVT (deep vein thrombosis) 2003   per pt completed coumadin x 6 mon, never a blood clot prior to 2003 and none since   Hypertension    followed by pcp   (nuclear stress test in epic 05-13-2019, Low risk without ischemia, normal LV and wall motion , ef 68%)   Hypothyroidism    followed by pcp   Lattice degeneration of right retina    OA (osteoarthritis)    Osteopenia    PMB (postmenopausal bleeding)    Pre-diabetes    Vegetarian diet    eats fish and eggs; no meat   Past Surgical History:  Procedure Laterality Date   CATARACT EXTRACTION W/ INTRAOCULAR LENS IMPLANT Bilateral 07/2020   COLONOSCOPY  2016   DILATATION & CURETTAGE/HYSTEROSCOPY WITH MYOSURE N/A 03/27/2021   Procedure: DILATATION & CURETTAGE/HYSTEROSCOPY/Polypectomy WITH MYOSURE;  Surgeon: Azucena Fallen, MD;  Location: [facility];  Service: Gynecology;  Laterality: N/A;   KNEE ARTHROSCOPY Right 03/16/2003   OOPHORECTOMY Left 1999   benign cyst per pt   TOTAL KNEE ARTHROPLASTY Right 09/14/2014   Procedure: RIGHT TOTAL  KNEE ARTHROPLASTY;  Surgeon: Garald Balding, MD;  Location: [place];  Service: Orthopedics;  Laterality: Right;   TOTAL KNEE ARTHROPLASTY Left 11/20/2016   Procedure: LEFT TOTAL KNEE ARTHROPLASTY;  Surgeon: Garald Balding, MD;  Location: [place];  Service: Orthopedics;  Laterality: Left;   Patient Active Problem List   Diagnosis Date Noted   Low back pain 12/12/2021   Dyslipidemia 10/25/2021   Type 2 diabetes mellitus with hyperglycemia ([place]) 04/10/2021   Abnormal weight gain 07/04/2020   Epigastric pain 07/04/2020   Pruritus ani 07/04/2020   S/P total knee replacement using cement, left 11/20/2016   Osteopenia 02/28/2015   Left lumbar radiculitis 02/28/2015   Acute blood loss anemia 09/20/2014   Primary osteoarthritis of right knee 09/14/2014   Osteoarthritis of left knee 09/14/2014   Vitamin B 12 deficiency 07/29/2014   Obesity (BMI 30-39.9) 12/08/2013   Osteoarthritis, knee 92/92/4462   Metabolic syndrome 86/38/1771   DVT, HX OF 02/10/2010   Vitamin D deficiency 08/25/2007   Disorder of bone and cartilage 04/08/2007   Hypothyroidism 11/18/2006   ANEMIA-NOS 11/18/2006   Essential hypertension 11/18/2006   GERD 11/18/2006    PCP: Carolann Littler, MD REFERRING PROVIDER: Carolann Littler, MD  REFERRING DIAG: R42 (ICD-10-CM) - Vertigo   THERAPY  DIAG:  Dizziness and giddiness  ONSET DATE: 03/21/2022 (MD referral and onset of vertigo)  Rationale for Evaluation and Treatment: Rehabilitation  SUBJECTIVE:   SUBJECTIVE STATEMENT: Vertigo is much better but not gone. Feeling imbalance with walking.  Pt accompanied by: self  PERTINENT HISTORY: See PMH above  PAIN:  Are you having pain? No  PRECAUTIONS: None  FALLS: Has patient fallen in last 6 months? Yes. Number of falls 1  LIVING ENVIRONMENT: Lives with: lives with their spouse Lives in: House/apartment Stairs: to enter home  PLOF: Independent  PATIENT GOALS: To get rid of dizziness  OBJECTIVE:     TODAY'S TREATMENT: 04/03/22 Activity Comments  R sidelying test Latent R upbeating torsional nystagmus lasting ~30 sec  L sidelying test Latent L upbeating torsional nystagmus lasting ~60 sec  L DH  negative  R DH R upbeating torsional nystagmus lasting ~15 sec  R epley  Tolerated well   R DH Lower amplitude R upbeating torsional nystagmus lasting ~10 sec  R epley Upon L head turn, L upbeating torsional nystagmus lasting 20 sec  Standing 1/2 turns to targets  Fairly good stability; report of 5% dizziness   Standing head turns to targets 30" Cues for quick head turns; good stability; no dizziness   Standing head nods to targets 30" Good stability; c/o very slight dizziness upon looking up        [place] Last updated: 04/03/22 Access Code: [email or web address] URL: [email or web address] Date: 04/03/2022 Prepared by: [facility]  Exercises - Brandt-Daroff Vestibular Exercise  - 1 x daily - 5 x weekly - 2 sets - 3-5 reps - Standing with Head Nod  - 1 x daily - 5 x weekly - 3 sets - 30 sec hold    PATIENT EDUCATION: Education details: HEP- to be performed with family's supervision for safety  Person educated: Patient Education method: Explanation, Demonstration, Tactile cues, Verbal cues, and Handouts Education comprehension: verbalized understanding and returned demonstration    Below measures were taken at time of initial evaluation unless otherwise specified:   PATIENT SURVEYS:  FOTO 46 for intake; goal is 64  VESTIBULAR ASSESSMENT:  GENERAL OBSERVATION: No acute distress   SYMPTOM BEHAVIOR:  Subjective history: Started 3 days ago.  Rolling,   Non-Vestibular symptoms: headaches and fullness in R eye sometimes  Type of dizziness: Spinning/Vertigo  Frequency: whenever lying down  Duration: seconds-minutes  Aggravating factors: Induced by position change: lying supine, rolling to the right, and rolling to the  left and Induced by motion: looking up at the ceiling and turning head quickly  Relieving factors: head stationary  Progression of symptoms: unchanged  OCULOMOTOR EXAM:  Ocular Alignment: abnormal and R eye lower than L  Ocular ROM: No Limitations and reports some double vision  Spontaneous Nystagmus: absent  Gaze-Induced Nystagmus: left beating with left gaze  Smooth Pursuits: intact  Saccades: intact     VESTIBULAR - OCULAR REFLEX:   Slow VOR: Normal  VOR Cancellation: Normal  Head-Impulse Test: HIT Right: negative HIT Left: negative    POSITIONAL TESTING: Right Dix-Hallpike: upbeating, right nystagmus, c/o dizziness, and Duration: 15 sec Left Dix-Hallpike: upbeating, left nystagmus, c/o dizziness, and Duration: 11 sec Right Roll Test: geotropic nystagmus and Duration: 30 sec Left Roll Test: no nystagmus  VESTIBULAR TREATMENT:  DATE: 03/23/2022  Canalith Repositioning:  BBQ Roll Right: Number of Reps: 1, Response to Treatment: symptoms improved, and Comment: Did not complete again due to time constraints.  Minimal c/o symptoms upon return to sit.  *Pt is unable to get to quadruped position, stayed in prone on elbows prior to return to sit  PATIENT EDUCATION: Education details: Eval results, POC, rationale for BPPV treatment Person educated: Patient Education method: Explanation Education comprehension: verbalized understanding  HOME EXERCISE PROGRAM:  GOALS: Goals reviewed with patient? Yes  SHORT TERM GOALS: Target date: = LTGs    LONG TERM GOALS: Target date: 04/13/2022  Pt will be independent with HEP for improved dizziness for improved bed mobility. Baseline:  Goal status: IN PROGRESS  2.  Pt will perform bed mobility tasks without c/o dizziness. Baseline:  Goal status: IN PROGRESS  3.  FOTO score for vertigo to improve to 64 to demonstrate improved dizziness  and improved functional mobility. Baseline: 46 at intake Goal status: IN PROGRESS    ASSESSMENT:  CLINICAL IMPRESSION: Patient arrived to session with report of improvement in vertigo but not yet resolved. Positional testing initially indicated B posterior canalithiasis, however only with R DH positive, treated with R Epley x2. Initiated habituation and gaze stabilization activities to help further resolve remaining symptoms. Patient reported understanding and without complaints upon leaving.   OBJECTIVE IMPAIRMENTS: dizziness.   ACTIVITY LIMITATIONS: bed mobility  PARTICIPATION LIMITATIONS:  therapy exercise for back  PERSONAL FACTORS: 3+ comorbidities: see above  are also affecting patient's functional outcome.   REHAB POTENTIAL: Good  CLINICAL DECISION MAKING: Stable/uncomplicated  EVALUATION COMPLEXITY: Low   PLAN:  PT FREQUENCY: 1-2x/week  PT DURATION: 4 weeks, including eval week  PLANNED INTERVENTIONS: Therapeutic exercises, Therapeutic activity, Neuromuscular re-education, Balance training, Gait training, Patient/Family education, Self Care, Vestibular training, and Canalith repositioning  PLAN FOR NEXT SESSION: Reassess positional BPPV testing and treat appropriate canals.  Initiate HEP as appropriate.  Janene Harvey, PT, DPT 04/03/22 4:15 PM  [facility] [place], [place], [place] [address] Phone # [phone] Fax # [phone]

## 2022-04-03 ENCOUNTER — Encounter: Payer: Self-pay | Admitting: Physical Therapy

## 2022-04-03 ENCOUNTER — Ambulatory Visit: Payer: Medicare Other

## 2022-04-03 ENCOUNTER — Ambulatory Visit: Payer: Medicare Other | Admitting: Physical Therapy

## 2022-04-03 DIAGNOSIS — R252 Cramp and spasm: Secondary | ICD-10-CM

## 2022-04-03 DIAGNOSIS — M6281 Muscle weakness (generalized): Secondary | ICD-10-CM | POA: Diagnosis not present

## 2022-04-03 DIAGNOSIS — R42 Dizziness and giddiness: Secondary | ICD-10-CM

## 2022-04-03 DIAGNOSIS — M5459 Other low back pain: Secondary | ICD-10-CM | POA: Diagnosis not present

## 2022-04-03 NOTE — Therapy (Addendum)
OUTPATIENT PHYSICAL THERAPY TREATMENT      Patient Name: Kimberly [name] MRN: [id number] DOB:1951/02/20, 71 y.o., female Today's Date: 04/03/2022  Progress Note Reporting Period 03/05/22 to 04/03/22  See note below for Objective Data and Assessment of Progress/Goals.       PT End of Session - 04/03/22 0943     Visit Number 20   3 neuro, 17 ortho   Authorization Type Medicare B- Pt is at [place], visit 15, 03/22/2022    PT Start Time 0932    PT Stop Time 6644    PT Time Calculation (min) 43 min    Activity Tolerance Patient tolerated treatment well    Behavior During Therapy [facility] for tasks assessed/performed                   Past Surgical History:  Procedure Laterality Date   CATARACT EXTRACTION W/ INTRAOCULAR LENS IMPLANT Bilateral 07/2020   COLONOSCOPY  2016   DILATATION & CURETTAGE/HYSTEROSCOPY WITH MYOSURE N/A 03/27/2021   Procedure: DILATATION & CURETTAGE/HYSTEROSCOPY/Polypectomy WITH MYOSURE;  Surgeon: Azucena Fallen, MD;  Location: [facility];  Service: Gynecology;  Laterality: N/A;   KNEE ARTHROSCOPY Right 03/16/2003   OOPHORECTOMY Left 1999   benign cyst per pt   TOTAL KNEE ARTHROPLASTY Right 09/14/2014   Procedure: RIGHT TOTAL KNEE ARTHROPLASTY;  Surgeon: Garald Balding, MD;  Location: [place];  Service: Orthopedics;  Laterality: Right;   TOTAL KNEE ARTHROPLASTY Left 11/20/2016   Procedure: LEFT TOTAL KNEE ARTHROPLASTY;  Surgeon: Garald Balding, MD;  Location: [place];  Service: Orthopedics;  Laterality: Left;   Patient Active Problem List   Diagnosis Date Noted   Low back pain 12/12/2021   Dyslipidemia 10/25/2021   Type 2 diabetes mellitus with hyperglycemia ([place]) 04/10/2021   Abnormal weight gain 07/04/2020   Epigastric pain 07/04/2020   Pruritus ani 07/04/2020   S/P total knee replacement using cement, left 11/20/2016   Osteopenia 02/28/2015   Left lumbar radiculitis 02/28/2015   Acute blood loss anemia 09/20/2014   Primary  osteoarthritis of right knee 09/14/2014   Osteoarthritis of left knee 09/14/2014   Vitamin B 12 deficiency 07/29/2014   Obesity (BMI 30-39.9) 12/08/2013   Osteoarthritis, knee 03/47/4259   Metabolic syndrome 56/38/7564   DVT, HX OF 02/10/2010   Vitamin D deficiency 08/25/2007   Disorder of bone and cartilage 04/08/2007   Hypothyroidism 11/18/2006   ANEMIA-NOS 11/18/2006   Essential hypertension 11/18/2006   GERD 11/18/2006    PCP: Carolann Littler, MD  REFERRING PROVIDER: [name], MD  REFERRING DIAG: [phone] (ICD-10-CM) - Acute left-sided low back pain with left-sided sciatica  Rationale for Evaluation and Treatment Rehabilitation  THERAPY DIAG:  Cramp and spasm  Other low back pain  Muscle weakness (generalized)  ONSET DATE: 1.5 months ago  SUBJECTIVE:  SUBJECTIVE STATEMENT: I am so much better.  I feel 75% better.    PERTINENT HISTORY:  Bil TKAs  PAIN:  Are you having pain? Not right now: NPRS scale: 1/10 Pain location: Pain description: Lt gluteal Aggravating factors: standing, sleeping wrong  Relieving factors: sitting on donut pillow, sometimes pain medication  Walking is limited to 1.5 miles (3 miles is goal)  PRECAUTIONS: None  WEIGHT BEARING RESTRICTIONS No  FALLS:  Has patient fallen in last 6 months? Yes. Number of falls 1.  Fell down the steps while looking at watch. No balance deficits overall.   LIVING ENVIRONMENT: Lives with: lives with their family Lives in: House/apartment Stairs: Yes: External from garage.    OCCUPATION: retired   PLOF: Independent  PATIENT GOALS reduce LBP  OBJECTIVE:   DIAGNOSTIC FINDINGS:  From MD note: The last MRI was performed in 2021 through the [facility].  She had multilevel mild to moderate degenerative  changes and moderate spinal stenosis at L2-3 secondary to disc bulge and facet arthropathy.    PATIENT SURVEYS:  FOTO 60 (goal is 11) FOTO 62 03/05/22 FOTO 65 04/03/22  SCREENING FOR RED FLAGS: Bowel or bladder incontinence: No Spinal tumors: No Cauda equina syndrome: No Compression fracture: No Abdominal aneurysm: No  COGNITION:  Overall cognitive status: Within functional limits for tasks assessed     SENSATION: WFL  MUSCLE LENGTH: Hamstring length is limited by 25% with Lt LE pain reported at end range  POSTURE: rounded shoulders, forward head, and flexed trunk   PALPATION: Tension in bil lumbar paraspinals with pain L3-5 bilaterally, trigger points   LUMBAR ROM:  Full with limited lumbar segmental mobility.  Pain with Lt sidebending and lumbar flexion.   LOWER EXTREMITY ROM:    Hip flexibility limited by 25% in hip flexion and 50% in IR/ER  LOWER EXTREMITY MMT:   Bil hip strength 4/5, knee 4+/5 02/28/22: Bil hip flexors 4+/5, all others 4/5, quads 4+/5: tested in pool    LUMBAR SPECIAL TESTS:  Straight leg raise test: Negative  GAIT: Distance walked: 50 Assistive device utilized: None Level of assistance: Complete Independence Comments: increased trunk rotation, flexed trunk  TODAY'S TREATMENT  04/03/22: Nustep L5 x 32mn PT present to discuss progress. Sit to stand holding 10# KB 2x15 Seated hamstring stretch  3x 20 seconds  Supine SLR 2.5# with core activation 10x Bil  Trigger Point Dry-Needling  Treatment instructions: Expect mild to moderate muscle soreness. S/S of pneumothorax if dry needled over a lung field, and to seek immediate medical attention should they occur. Patient verbalized understanding of these instructions and education.  Patient Consent Given: Yes Education handout provided: Previously provided Muscles treated: bil lumbar multfidi and gluteals  Treatment response/outcome: Utilized skilled palpation to identify trigger points.   During dry needling able to palpate muscle twitch and muscle elongation  Elongation and release after needling  Skilled palpation and monitoring by PT during dry needling    03/28/22:Pt arrives for aquatic physical therapy. Treatment took place in 3.5-5.5 feet of water. Water temperature was 92 degrees F. Pt entered the pool via stairs with mild use of rails step to step.  Seated water bench with 75% submersion Pt performed seated LE AROM exercises 20x in all planes,  75% water depth for water walking 6x in each direction with small noodle for postural support. Pt a little apprehensive at first.  Standing hip kicks 10x each direction with 2 finger for for balance support.  Single buoy lat/core  press standing against wall 2x10 with single knee to chest stretch in between sets.   03/26/22: Nustep L5 x 55mn PT present to discuss progress. Sit to stand holding 10# KB 2x15 Seated hamstring stretch  3x 20 seconds  Supine SLR 2.5# with core activation 10x Bil Sidelying hip abduction 2.5# 10x Bil Supine: ball squeezes and hip abduction with yellow band 2x10   PATIENT EDUCATION:  Education details: Access Code: [email or web address], DN info Person educated: Patient Education method: Explanation, Demonstration, and Handouts Education comprehension: verbalized understanding and returned demonstration   HOME EXERCISE PROGRAM: Access [facility]: [facility]URL: [email or web address] Date: 02/07/2022 Prepared by: [place] [place] Exercises - Supine Lower Trunk Rotation  - 3 x daily - 7 x weekly - 1 sets - 3 reps - 20 hold - Hooklying Single Knee to Chest  - 3 x daily - 7 x weekly - 1 sets - 3 reps - 20 hold - Seated Hamstring Stretch  - 3 x daily - 7 x weekly - 1 sets - 3 reps - 20 hold - Seated Piriformis Stretch with Trunk Bend  - 3 x daily - 7 x weekly - 1 sets - 3 reps - 20 hold - Clamshell  - 1 x daily - 7 x weekly - 2 sets - 10 reps - Sit to Stand Without Arm Support  - 1 x daily - 7 x weekly - 2  sets - 10 reps - Supine Hip Adduction Isometric with Ball  - 1 x daily - 7 x weekly - 2 sets - 10 reps - 5 hold - Supine Active Straight Leg Raise  - 1 x daily - 7 x weekly - 1 sets - 10 reps - Sidelying Hip Abduction  - 1 x daily - 7 x weekly - 1 sets - 10 reps - Supine Bridge  - 1 x daily - 7 x weekly - 1 sets - 10 reps ASSESSMENT:  CLINICAL IMPRESSION: Pt arrives with very little pain. She reports her vertigo has been stable. Pt has decided to not join [employer] to expenses. [facility]    OBJECTIVE IMPAIRMENTS decreased activity tolerance, difficulty walking, decreased ROM, decreased strength, increased muscle spasms, impaired flexibility, postural dysfunction, and pain.   ACTIVITY LIMITATIONS sitting, standing, squatting, and locomotion level  PARTICIPATION LIMITATIONS: meal prep, cleaning, laundry, shopping, and community activity  PERSONAL FACTORS Age and 1-2 comorbidities: 2 TKAs, chronic LBP  are also affecting patient's functional outcome.   REHAB POTENTIAL: Good  CLINICAL DECISION MAKING: Stable/uncomplicated  EVALUATION COMPLEXITY: Low   GOALS: Goals reviewed with patient? Yes  SHORT TERM GOALS: Target date: 02/07/2022  Be independent in initial HEP Baseline: Goal status: MET  2.  Report > or = to 30% reduction in LBP with standing and sitting  Baseline: 25-30% (02/07/22) Goal status: Goal met 02/28/22 75%  3.  Walk > or = to 2 miles for exercise without limitation due to LBP Baseline: 2.30 yesterday Goal status: met  LONG TERM GOALS: Target date: 04/20/22  Be independent in advanced HEP including aquatics exercises for independence after D/C  Baseline: independent in land based exercise and needs further instruction in aquatics Goal status: MET  2.  Reduce FOTO to < or = to 71 Baseline: 65 (04/03/22) Goal status: partially met  3.  Verbalize and demonstrate body mechanics modifications for lumbar protection  with ADLs and self-care Baseline:  Goal status: MET  4.  Report > or = to 70%  reduction in LBP with standing and sitting  Baseline:70% (04/03/22) Goal status: MET  5.  Return to regular walking for exercise up to 3 miles without limitation due to LBP Baseline: 2-3 miles (11.21.23) Goal status: MET    PLAN: D/C PT to HEP PHYSICAL THERAPY DISCHARGE SUMMARY  Visits from Start of Care: 20  Current functional level related to goals / functional outcomes: See above for current status.  Pt reports 70% improvement since the start of care.    Remaining deficits: No functional deficits related to LBP or leg pain at this time   Education / Equipment: HEP, body mechanics    Patient agrees to discharge. Patient goals were partially met. Patient is being discharged due to being pleased with the current functional level.   Sigurd Sos, PT 04/03/22 10:19 AM   [facility] [address], [place], [place] [address] Phone # [phone] Fax [phone]

## 2022-04-09 NOTE — Therapy (Signed)
OUTPATIENT PHYSICAL THERAPY VESTIBULAR TREATMENT NOTES     Patient Name: Kimberly [name] MRN: [id number] DOB:1950-08-08, 71 y.o., female Today's Date: 04/09/2022      Past Medical History:  Diagnosis Date   DDD (degenerative disc disease), lumbar    GERD (gastroesophageal reflux disease)    History of anemia    History of COVID-19    11-11-2020 positive result in care everywhere   History of DVT (deep vein thrombosis) 2003   per pt completed coumadin x 6 mon, never a blood clot prior to 2003 and none since   Hypertension    followed by pcp   (nuclear stress test in epic 05-13-2019, Low risk without ischemia, normal LV and wall motion , ef 68%)   Hypothyroidism    followed by pcp   Lattice degeneration of right retina    OA (osteoarthritis)    Osteopenia    PMB (postmenopausal bleeding)    Pre-diabetes    Vegetarian diet    eats fish and eggs; no meat   Past Surgical History:  Procedure Laterality Date   CATARACT EXTRACTION W/ INTRAOCULAR LENS IMPLANT Bilateral 07/2020   COLONOSCOPY  2016   DILATATION & CURETTAGE/HYSTEROSCOPY WITH MYOSURE N/A 03/27/2021   Procedure: DILATATION & CURETTAGE/HYSTEROSCOPY/Polypectomy WITH MYOSURE;  Surgeon: Azucena Fallen, MD;  Location: [facility];  Service: Gynecology;  Laterality: N/A;   KNEE ARTHROSCOPY Right 03/16/2003   OOPHORECTOMY Left 1999   benign cyst per pt   TOTAL KNEE ARTHROPLASTY Right 09/14/2014   Procedure: RIGHT TOTAL KNEE ARTHROPLASTY;  Surgeon: Garald Balding, MD;  Location: [place];  Service: Orthopedics;  Laterality: Right;   TOTAL KNEE ARTHROPLASTY Left 11/20/2016   Procedure: LEFT TOTAL KNEE ARTHROPLASTY;  Surgeon: Garald Balding, MD;  Location: [place];  Service: Orthopedics;  Laterality: Left;   Patient Active Problem List   Diagnosis Date Noted   Low back pain 12/12/2021   Dyslipidemia 10/25/2021   Type 2 diabetes mellitus with hyperglycemia ([place]) 04/10/2021   Abnormal weight gain 07/04/2020    Epigastric pain 07/04/2020   Pruritus ani 07/04/2020   S/P total knee replacement using cement, left 11/20/2016   Osteopenia 02/28/2015   Left lumbar radiculitis 02/28/2015   Acute blood loss anemia 09/20/2014   Primary osteoarthritis of right knee 09/14/2014   Osteoarthritis of left knee 09/14/2014   Vitamin B 12 deficiency 07/29/2014   Obesity (BMI 30-39.9) 12/08/2013   Osteoarthritis, knee 70/35/0093   Metabolic syndrome 81/82/9937   DVT, HX OF 02/10/2010   Vitamin D deficiency 08/25/2007   Disorder of bone and cartilage 04/08/2007   Hypothyroidism 11/18/2006   ANEMIA-NOS 11/18/2006   Essential hypertension 11/18/2006   GERD 11/18/2006    PCP: Carolann Littler, MD REFERRING PROVIDER: Carolann Littler, MD  REFERRING DIAG: R42 (ICD-10-CM) - Vertigo   THERAPY DIAG:  No diagnosis found.  ONSET DATE: 03/21/2022 (MD referral and onset of vertigo)  Rationale for Evaluation and Treatment: Rehabilitation  SUBJECTIVE:   SUBJECTIVE STATEMENT: Vertigo is much better but not gone. Feeling imbalance with walking.  Pt accompanied by: self  PERTINENT HISTORY: See PMH above  PAIN:  Are you having pain? No  PRECAUTIONS: None  FALLS: Has patient fallen in last 6 months? Yes. Number of falls 1  LIVING ENVIRONMENT: Lives with: lives with their spouse Lives in: House/apartment Stairs: to enter home  PLOF: Independent  PATIENT GOALS: To get rid of dizziness  OBJECTIVE:       TODAY'S TREATMENT: 04/10/22 Activity Comments  HOME EXERCISE PROGRAM Last updated: 04/03/22 Access Code: [email or web address] URL: [email or web address] Date: 04/03/2022 Prepared by: [facility]  Exercises - Brandt-Daroff Vestibular Exercise  - 1 x daily - 5 x weekly - 2 sets - 3-5 reps - Standing with Head Nod  - 1 x daily - 5 x weekly - 3 sets - 30 sec hold      Below measures were taken at time of initial evaluation  unless otherwise specified:   PATIENT SURVEYS:  FOTO 46 for intake; goal is 64  VESTIBULAR ASSESSMENT:  GENERAL OBSERVATION: No acute distress   SYMPTOM BEHAVIOR:  Subjective history: Started 3 days ago.  Rolling,   Non-Vestibular symptoms: headaches and fullness in R eye sometimes  Type of dizziness: Spinning/Vertigo  Frequency: whenever lying down  Duration: seconds-minutes  Aggravating factors: Induced by position change: lying supine, rolling to the right, and rolling to the left and Induced by motion: looking up at the ceiling and turning head quickly  Relieving factors: head stationary  Progression of symptoms: unchanged  OCULOMOTOR EXAM:  Ocular Alignment: abnormal and R eye lower than L  Ocular ROM: No Limitations and reports some double vision  Spontaneous Nystagmus: absent  Gaze-Induced Nystagmus: left beating with left gaze  Smooth Pursuits: intact  Saccades: intact     VESTIBULAR - OCULAR REFLEX:   Slow VOR: Normal  VOR Cancellation: Normal  Head-Impulse Test: HIT Right: negative HIT Left: negative    POSITIONAL TESTING: Right Dix-Hallpike: upbeating, right nystagmus, c/o dizziness, and Duration: 15 sec Left Dix-Hallpike: upbeating, left nystagmus, c/o dizziness, and Duration: 11 sec Right Roll Test: geotropic nystagmus and Duration: 30 sec Left Roll Test: no nystagmus  VESTIBULAR TREATMENT:                                                                                                   DATE: 03/23/2022  Canalith Repositioning:  BBQ Roll Right: Number of Reps: 1, Response to Treatment: symptoms improved, and Comment: Did not complete again due to time constraints.  Minimal c/o symptoms upon return to sit.  *Pt is unable to get to quadruped position, stayed in prone on elbows prior to return to sit  PATIENT EDUCATION: Education details: Eval results, POC, rationale for BPPV treatment Person educated: Patient Education method: Explanation Education  comprehension: verbalized understanding  HOME EXERCISE PROGRAM:  GOALS: Goals reviewed with patient? Yes  SHORT TERM GOALS: Target date: = LTGs    LONG TERM GOALS: Target date: 04/13/2022  Pt will be independent with HEP for improved dizziness for improved bed mobility. Baseline:  Goal status: IN PROGRESS  2.  Pt will perform bed mobility tasks without c/o dizziness. Baseline:  Goal status: IN PROGRESS  3.  FOTO score for vertigo to improve to 64 to demonstrate improved dizziness and improved functional mobility. Baseline: 46 at intake Goal status: IN PROGRESS    ASSESSMENT:  CLINICAL IMPRESSION: Patient arrived to session with report of improvement in vertigo but not yet resolved. Positional testing initially indicated B posterior canalithiasis, however only with R DH positive, treated with  R Epley x2. Initiated habituation and gaze stabilization activities to help further resolve remaining symptoms. Patient reported understanding and without complaints upon leaving.   OBJECTIVE IMPAIRMENTS: dizziness.   ACTIVITY LIMITATIONS: bed mobility  PARTICIPATION LIMITATIONS:  therapy exercise for back  PERSONAL FACTORS: 3+ comorbidities: see above  are also affecting patient's functional outcome.   REHAB POTENTIAL: Good  CLINICAL DECISION MAKING: Stable/uncomplicated  EVALUATION COMPLEXITY: Low   PLAN:  PT FREQUENCY: 1-2x/week  PT DURATION: 4 weeks, including eval week  PLANNED INTERVENTIONS: Therapeutic exercises, Therapeutic activity, Neuromuscular re-education, Balance training, Gait training, Patient/Family education, Self Care, Vestibular training, and Canalith repositioning  PLAN FOR NEXT SESSION: Reassess positional BPPV testing and treat appropriate canals.  Initiate HEP as appropriate.  Janene Harvey, PT, DPT 04/09/22 2:44 PM  [facility] at [facility] [address] [place], [place], [place] [address] Phone # [phone] Fax # [phone]

## 2022-04-10 ENCOUNTER — Other Ambulatory Visit: Payer: Self-pay | Admitting: Family Medicine

## 2022-04-11 ENCOUNTER — Ambulatory Visit: Payer: Medicare Other | Admitting: Physical Therapy

## 2022-04-11 ENCOUNTER — Encounter: Payer: Self-pay | Admitting: Family Medicine

## 2022-04-11 ENCOUNTER — Ambulatory Visit (INDEPENDENT_AMBULATORY_CARE_PROVIDER_SITE_OTHER): Payer: Medicare Other | Admitting: Family Medicine

## 2022-04-11 ENCOUNTER — Encounter: Payer: Self-pay | Admitting: Physical Therapy

## 2022-04-11 VITALS — BP 130/66 | HR 85 | Temp 97.6°F | Ht 61.0 in | Wt 168.8 lb

## 2022-04-11 DIAGNOSIS — E039 Hypothyroidism, unspecified: Secondary | ICD-10-CM

## 2022-04-11 DIAGNOSIS — E1165 Type 2 diabetes mellitus with hyperglycemia: Secondary | ICD-10-CM

## 2022-04-11 DIAGNOSIS — R42 Dizziness and giddiness: Secondary | ICD-10-CM | POA: Diagnosis not present

## 2022-04-11 DIAGNOSIS — R252 Cramp and spasm: Secondary | ICD-10-CM | POA: Diagnosis not present

## 2022-04-11 DIAGNOSIS — I10 Essential (primary) hypertension: Secondary | ICD-10-CM

## 2022-04-11 DIAGNOSIS — M5459 Other low back pain: Secondary | ICD-10-CM | POA: Diagnosis not present

## 2022-04-11 DIAGNOSIS — M6281 Muscle weakness (generalized): Secondary | ICD-10-CM | POA: Diagnosis not present

## 2022-04-11 LAB — POCT GLYCOSYLATED HEMOGLOBIN (HGB A1C): Hemoglobin A1C: 6.8 % — AB (ref 4.0–5.6)

## 2022-04-11 MED ORDER — METFORMIN HCL 500 MG PO TABS: 500.0000 mg | ORAL_TABLET | Freq: Two times a day (BID) | ORAL | 3 refills | Status: DC

## 2022-04-11 MED ORDER — AMLODIPINE BESYLATE 2.5 MG PO TABS: 2.5000 mg | ORAL_TABLET | Freq: Every day | ORAL | 3 refills | Status: DC

## 2022-04-11 MED ORDER — EMPAGLIFLOZIN 10 MG PO TABS: 10.0000 mg | ORAL_TABLET | Freq: Every day | ORAL | 3 refills | Status: DC

## 2022-04-11 NOTE — Patient Instructions (Signed)
Set up 3 month follow up after return from [place].

## 2022-04-11 NOTE — Progress Notes (Signed)
Established Patient Office Visit  Subjective   Patient ID: Kimberly [name], female    DOB: 09-17-1950  Age: 71 y.o. MRN: [id number]  Chief Complaint  Patient presents with   Follow-up    HPI   Brinly is seen for medical follow-up.  She has type 2 diabetes, hypertension, hypothyroidism, osteoarthritis.  Generally doing well.  She and her husband are getting ready to celebrate their 50th wedding anniversary.  She and her husband also planned to return to [place] December 7 for about 3 to 4 weeks.  Regarding her diabetes her A1c's have been stable around 6.8.  She remains on metformin.  She does have specific questions regarding Jardiance.  Her weight is up slightly from last visit.  She remains on amlodipine and irbesartan for hypertension and blood pressure is very stable.  Past Medical History:  Diagnosis Date   DDD (degenerative disc disease), lumbar    GERD (gastroesophageal reflux disease)    History of anemia    History of COVID-19    11-11-2020 positive result in care everywhere   History of DVT (deep vein thrombosis) 2003   per pt completed coumadin x 6 mon, never a blood clot prior to 2003 and none since   Hypertension    followed by pcp   (nuclear stress test in epic 05-13-2019, Low risk without ischemia, normal LV and wall motion , ef 68%)   Hypothyroidism    followed by pcp   Lattice degeneration of right retina    OA (osteoarthritis)    Osteopenia    PMB (postmenopausal bleeding)    Pre-diabetes    Vegetarian diet    eats fish and eggs; no meat   Past Surgical History:  Procedure Laterality Date   CATARACT EXTRACTION W/ INTRAOCULAR LENS IMPLANT Bilateral 07/2020   COLONOSCOPY  2016   DILATATION & CURETTAGE/HYSTEROSCOPY WITH MYOSURE N/A 03/27/2021   Procedure: DILATATION & CURETTAGE/HYSTEROSCOPY/Polypectomy WITH MYOSURE;  Surgeon: Azucena Fallen, MD;  Location: [facility];  Service: Gynecology;  Laterality: N/A;   KNEE ARTHROSCOPY Right 03/16/2003    OOPHORECTOMY Left 1999   benign cyst per pt   TOTAL KNEE ARTHROPLASTY Right 09/14/2014   Procedure: RIGHT TOTAL KNEE ARTHROPLASTY;  Surgeon: Garald Balding, MD;  Location: [place];  Service: Orthopedics;  Laterality: Right;   TOTAL KNEE ARTHROPLASTY Left 11/20/2016   Procedure: LEFT TOTAL KNEE ARTHROPLASTY;  Surgeon: Garald Balding, MD;  Location: [place];  Service: Orthopedics;  Laterality: Left;    reports that she has never smoked. She has never used smokeless tobacco. She reports that she does not drink alcohol and does not use drugs. family history includes Alcohol abuse in an other family member; Diabetes in her mother and another family member; Heart failure in her father. Allergies  Allergen Reactions   Enoxaparin Sodium Itching and Swelling    SWELLING REACTION UNSPECIFIED     Review of Systems  Constitutional:  Negative for malaise/fatigue.  Eyes:  Negative for blurred vision.  Respiratory:  Negative for shortness of breath.   Cardiovascular:  Negative for chest pain.  Neurological:  Negative for dizziness, weakness and headaches.      Objective:     BP 130/66 (BP Location: Left Arm, Patient Position: Sitting, Cuff Size: Normal)   Pulse 85   Temp 97.6 F (36.4 C) (Oral)   Ht '5\' 1"'$  (1.549 m)   Wt 168 lb 12.8 oz (76.6 kg)   LMP 05/14/1998   SpO2 98%   BMI 31.89  kg/m  BP Readings from Last 3 Encounters:  04/11/22 130/66  01/09/22 120/64  12/20/21 (!) 154/72   Wt Readings from Last 3 Encounters:  04/11/22 168 lb 12.8 oz (76.6 kg)  01/09/22 166 lb 3.2 oz (75.4 kg)  11/28/21 168 lb 6.4 oz (76.4 kg)      Physical Exam Vitals reviewed.  Constitutional:      Appearance: She is well-developed.  Eyes:     Pupils: Pupils are equal, round, and reactive to light.  Neck:     Thyroid: No thyromegaly.     Vascular: No JVD.  Cardiovascular:     Rate and Rhythm: Normal rate and regular rhythm.     Heart sounds:     No gallop.  Pulmonary:     Effort: Pulmonary  effort is normal. No respiratory distress.     Breath sounds: Normal breath sounds. No wheezing or rales.  Musculoskeletal:     Cervical back: Neck supple.     Right lower leg: No edema.     Left lower leg: No edema.  Neurological:     Mental Status: She is alert.      Results for orders placed or performed in visit on 04/11/22  POCT glycosylated hemoglobin (Hb A1C)  Result Value Ref Range   Hemoglobin A1C 6.8 (A) 4.0 - 5.6 %   HbA1c POC (<> result, manual entry)     HbA1c, POC (prediabetic range)     HbA1c, POC (controlled diabetic range)      Last CBC Lab Results  Component Value Date   WBC 6.7 03/27/2021   HGB 14.1 03/27/2021   HCT 41.8 03/27/2021   MCV 90.9 03/27/2021   MCH 30.7 03/27/2021   RDW 13.0 03/27/2021   PLT 250 64/33/2951   Last metabolic panel Lab Results  Component Value Date   GLUCOSE 107 (H) 10/10/2021   NA 136 10/10/2021   K 4.1 10/10/2021   CL 101 10/10/2021   CO2 27 10/10/2021   BUN 14 10/10/2021   CREATININE 0.55 10/10/2021   GFRNONAA >60 03/27/2021   CALCIUM 9.9 10/10/2021   PROT 7.9 10/10/2021   ALBUMIN 4.1 10/10/2021   BILITOT 0.7 10/10/2021   ALKPHOS 55 10/10/2021   AST 23 10/10/2021   ALT 29 10/10/2021   ANIONGAP 8 03/27/2021   Last lipids Lab Results  Component Value Date   CHOL 176 10/10/2021   HDL 50.00 10/10/2021   LDLCALC 103 (H) 10/10/2021   LDLDIRECT 142.2 03/20/2006   TRIG 117.0 10/10/2021   CHOLHDL 4 10/10/2021   Last hemoglobin A1c Lab Results  Component Value Date   HGBA1C 6.8 (A) 04/11/2022   Last thyroid functions Lab Results  Component Value Date   TSH 2.38 03/24/2021      The 10-year ASCVD risk score (Arnett DK, et al., 2019) is: 31%    Assessment & Plan:   #1 type 2 diabetes stable with A1c 6.8%.  She specifically had questions regarding whether she would be a candidate for Jardiance.  We reviewed ADA goals and she is interested in the cardiovascular protection of Jardiance.  We explained  potential side effects such as increased risk of UTIs and yeast infection.  We did agree to starting Jardiance 10 mg once daily.  She has good renal function.  Recommend 56-monthfollow-up and recheck basic metabolic panel then along with repeat A1c  #2 hypertension stable and at goal.  Continue amlodipine 2.5 mg and irbesartan 300 mg daily.  #3 hypothyroidism.  Needs  follow-up TSH.  Get at follow-up labs in 3 months  Recheck fasting lipids at follow-up in 3 months.  Discuss possible statin use further at that time.   Return in about 3 months (around 07/12/2022).    Carolann Littler, MD

## 2022-04-13 ENCOUNTER — Encounter (INDEPENDENT_AMBULATORY_CARE_PROVIDER_SITE_OTHER): Payer: Medicare Other | Admitting: Ophthalmology

## 2022-05-16 ENCOUNTER — Encounter (INDEPENDENT_AMBULATORY_CARE_PROVIDER_SITE_OTHER): Payer: Medicare Other | Admitting: Ophthalmology

## 2022-05-24 ENCOUNTER — Encounter (INDEPENDENT_AMBULATORY_CARE_PROVIDER_SITE_OTHER): Payer: Medicare Other | Admitting: Ophthalmology

## 2022-05-24 DIAGNOSIS — H353122 Nonexudative age-related macular degeneration, left eye, intermediate dry stage: Secondary | ICD-10-CM

## 2022-05-24 DIAGNOSIS — H43813 Vitreous degeneration, bilateral: Secondary | ICD-10-CM

## 2022-05-24 DIAGNOSIS — I10 Essential (primary) hypertension: Secondary | ICD-10-CM | POA: Diagnosis not present

## 2022-05-24 DIAGNOSIS — H35033 Hypertensive retinopathy, bilateral: Secondary | ICD-10-CM | POA: Diagnosis not present

## 2022-05-24 DIAGNOSIS — H353111 Nonexudative age-related macular degeneration, right eye, early dry stage: Secondary | ICD-10-CM | POA: Diagnosis not present

## 2022-05-24 DIAGNOSIS — H33302 Unspecified retinal break, left eye: Secondary | ICD-10-CM

## 2022-07-11 ENCOUNTER — Ambulatory Visit: Payer: Medicare Other | Admitting: Family Medicine

## 2022-07-13 ENCOUNTER — Ambulatory Visit: Payer: Medicare Other | Admitting: Family Medicine

## 2022-07-20 ENCOUNTER — Encounter: Payer: Self-pay | Admitting: Family Medicine

## 2022-07-20 ENCOUNTER — Ambulatory Visit (INDEPENDENT_AMBULATORY_CARE_PROVIDER_SITE_OTHER): Payer: Medicare Other | Admitting: Family Medicine

## 2022-07-20 VITALS — BP 132/74 | HR 67 | Temp 98.2°F | Ht 61.0 in | Wt 165.7 lb

## 2022-07-20 DIAGNOSIS — R202 Paresthesia of skin: Secondary | ICD-10-CM

## 2022-07-20 DIAGNOSIS — I10 Essential (primary) hypertension: Secondary | ICD-10-CM | POA: Diagnosis not present

## 2022-07-20 DIAGNOSIS — E1165 Type 2 diabetes mellitus with hyperglycemia: Secondary | ICD-10-CM | POA: Diagnosis not present

## 2022-07-20 DIAGNOSIS — E785 Hyperlipidemia, unspecified: Secondary | ICD-10-CM | POA: Diagnosis not present

## 2022-07-20 DIAGNOSIS — E039 Hypothyroidism, unspecified: Secondary | ICD-10-CM

## 2022-07-20 LAB — POCT GLYCOSYLATED HEMOGLOBIN (HGB A1C): Hemoglobin A1C: 6.7 % — AB (ref 4.0–5.6)

## 2022-07-20 NOTE — Patient Instructions (Addendum)
Set up for labs next Wednesday.  Try to establish more consistent exercise.

## 2022-07-20 NOTE — Progress Notes (Signed)
Established Patient Office Visit  Subjective   Patient ID: Kimberly [name], female    DOB: 1950-12-31  Age: 72 y.o. MRN: [id number]  Chief Complaint  Patient presents with   Medical Management of Chronic Issues    HPI   Ms. Kimberly [name] is here to discuss several items.  She and her husband just got back from [place] recently.  They were there for 3 weeks in December.  She did fall on the February 7 and is not sure exactly how she fell but may have tripped.  There is no loss of consciousness.  She does relate some occasional dizziness with ambulation when she first stands up.  She describes her dizziness as more of a lightheadedness.  No vertigo.  Type 2 diabetes.  Last A1c was 6.8%.  We added Jardiance 10 mg daily her metformin and she has been taking this regularly.  She has lost a few pounds.  Somewhat surprisingly, her A1c was only down to 6.7% today but she states she has not been walking much over the past few months and also perhaps consuming more carbohydrates than usual when she was in [place].  She has hypothyroidism on levothyroxine 50 mcg daily needs follow-up labs.  Her blood pressure is currently treated with amlodipine and irbesartan.  No consistent orthostatic symptoms.  She has mild hyperlipidemia.  She thinks she may have not tolerated statin previously but cannot recall exactly which.  She does complain of some intermittent paresthesias involving her distal fingertips bilaterally.  She has had previous B12 this was over 4 years ago.  She does take metformin and also PPI and has risk factors for B12 deficiency.  Denies any numbness in the feet.  Past Medical History:  Diagnosis Date   DDD (degenerative disc disease), lumbar    GERD (gastroesophageal reflux disease)    History of anemia    History of COVID-19    11-11-2020 positive result in care everywhere   History of DVT (deep vein thrombosis) 2003   per pt completed coumadin x 6 mon, never a blood clot prior to 2003 and none  since   Hypertension    followed by pcp   (nuclear stress test in epic 05-13-2019, Low risk without ischemia, normal LV and wall motion , ef 68%)   Hypothyroidism    followed by pcp   Lattice degeneration of right retina    OA (osteoarthritis)    Osteopenia    PMB (postmenopausal bleeding)    Pre-diabetes    Vegetarian diet    eats fish and eggs; no meat   Past Surgical History:  Procedure Laterality Date   CATARACT EXTRACTION W/ INTRAOCULAR LENS IMPLANT Bilateral 07/2020   COLONOSCOPY  2016   DILATATION & CURETTAGE/HYSTEROSCOPY WITH MYOSURE N/A 03/27/2021   Procedure: DILATATION & CURETTAGE/HYSTEROSCOPY/Polypectomy WITH MYOSURE;  Surgeon: Azucena Fallen, MD;  Location: [facility];  Service: Gynecology;  Laterality: N/A;   KNEE ARTHROSCOPY Right 03/16/2003   OOPHORECTOMY Left 1999   benign cyst per pt   TOTAL KNEE ARTHROPLASTY Right 09/14/2014   Procedure: RIGHT TOTAL KNEE ARTHROPLASTY;  Surgeon: Garald Balding, MD;  Location: [place];  Service: Orthopedics;  Laterality: Right;   TOTAL KNEE ARTHROPLASTY Left 11/20/2016   Procedure: LEFT TOTAL KNEE ARTHROPLASTY;  Surgeon: Garald Balding, MD;  Location: [place];  Service: Orthopedics;  Laterality: Left;    reports that she has never smoked. She has never used smokeless tobacco. She reports that she does not drink alcohol and  does not use drugs. family history includes Alcohol abuse in an other family member; Diabetes in her mother and another family member; Heart failure in her father. Allergies  Allergen Reactions   Enoxaparin Sodium Itching and Swelling    SWELLING REACTION UNSPECIFIED     Review of Systems  Constitutional:  Negative for malaise/fatigue.  Eyes:  Negative for blurred vision.  Respiratory:  Negative for shortness of breath.   Cardiovascular:  Negative for chest pain.  Gastrointestinal:  Negative for abdominal pain.  Neurological:  Negative for dizziness, weakness and headaches.       Objective:     BP 132/74 (BP Location: Left Arm, Patient Position: Sitting, Cuff Size: Normal)   Pulse 67   Temp 98.2 F (36.8 C) (Oral)   Ht '5\' 1"'$  (1.549 m)   Wt 165 lb 11.2 oz (75.2 kg)   LMP 05/14/1998   SpO2 99%   BMI 31.31 kg/m  BP Readings from Last 3 Encounters:  07/20/22 132/74  04/11/22 130/66  01/09/22 120/64   Wt Readings from Last 3 Encounters:  07/20/22 165 lb 11.2 oz (75.2 kg)  04/11/22 168 lb 12.8 oz (76.6 kg)  01/09/22 166 lb 3.2 oz (75.4 kg)      Physical Exam Vitals reviewed.  Constitutional:      Appearance: Normal appearance. She is well-developed.  Eyes:     Pupils: Pupils are equal, round, and reactive to light.  Neck:     Thyroid: No thyromegaly.     Vascular: No JVD.  Cardiovascular:     Rate and Rhythm: Normal rate and regular rhythm.     Heart sounds:     No gallop.  Pulmonary:     Effort: Pulmonary effort is normal. No respiratory distress.     Breath sounds: Normal breath sounds. No wheezing or rales.  Musculoskeletal:     Cervical back: Neck supple.     Right lower leg: No edema.     Left lower leg: No edema.  Neurological:     Mental Status: She is alert.      Results for orders placed or performed in visit on 07/20/22  POC HgB A1c  Result Value Ref Range   Hemoglobin A1C 6.7 (A) 4.0 - 5.6 %   HbA1c POC (<> result, manual entry)     HbA1c, POC (prediabetic range)     HbA1c, POC (controlled diabetic range)        The 10-year ASCVD risk score (Arnett DK, et al., 2019) is: 31.8%    Assessment & Plan:   #1 type 2 diabetes.  A1c today 6.7%.  Somewhat surprising that this has not come down more but may have had other variables such as decreased exercise and more carb intake past few months.  Continue current regimen of metformin and Jardiance and reassess in about 3 months.  Try to increase her walking.  #2 hypothyroidism.  On replacement with levothyroxine 50 mcg daily.  Recheck TSH.  She plans to return for fasting labs  next week  #3 dyslipidemia.  Discussed guidelines regarding statin use in diabetics.  She is willing to get follow-up fasting lipids and would have low threshold to consider statin if she is tolerating these.  Her 10-year calculated risk of CAD is 31.8%  #4 paresthesias involving fingertips bilaterally.  Recheck B12.  She does have risk factors of metformin and PPI use  #5 hypertension stable.  We checked blood pressure seated and obtained reading 138/70 and standing 128/68.  She  is encouraged to change positions slowly and stand for a few seconds before walking when she first gets up   No follow-ups on file.    Carolann Littler, MD

## 2022-07-23 ENCOUNTER — Ambulatory Visit: Payer: Medicare Other | Admitting: Family Medicine

## 2022-07-25 ENCOUNTER — Other Ambulatory Visit (INDEPENDENT_AMBULATORY_CARE_PROVIDER_SITE_OTHER): Payer: Medicare Other

## 2022-07-25 DIAGNOSIS — E039 Hypothyroidism, unspecified: Secondary | ICD-10-CM

## 2022-07-25 DIAGNOSIS — E785 Hyperlipidemia, unspecified: Secondary | ICD-10-CM

## 2022-07-25 DIAGNOSIS — E1165 Type 2 diabetes mellitus with hyperglycemia: Secondary | ICD-10-CM

## 2022-07-25 DIAGNOSIS — R202 Paresthesia of skin: Secondary | ICD-10-CM

## 2022-07-25 LAB — COMPREHENSIVE METABOLIC PANEL
ALT: 28 U/L (ref 0–35)
AST: 26 U/L (ref 0–37)
Albumin: 3.8 g/dL (ref 3.5–5.2)
Alkaline Phosphatase: 52 U/L (ref 39–117)
BUN: 14 mg/dL (ref 6–23)
CO2: 26 mEq/L (ref 19–32)
Calcium: 9.7 mg/dL (ref 8.4–10.5)
Chloride: 102 mEq/L (ref 96–112)
Creatinine, Ser: 0.59 mg/dL (ref 0.40–1.20)
GFR: 89.2 mL/min (ref >60.00)
Glucose, Bld: 123 mg/dL — ABNORMAL HIGH (ref 70–99)
Potassium: 4.1 mEq/L (ref 3.5–5.1)
Sodium: 135 mEq/L (ref 135–145)
Total Bilirubin: 0.8 mg/dL (ref 0.2–1.2)
Total Protein: 7.7 g/dL (ref 6.0–8.3)

## 2022-07-25 LAB — LIPID PANEL
Cholesterol: 187 mg/dL (ref 0–200)
HDL: 56.1 mg/dL (ref >39.00)
LDL Cholesterol: 116 mg/dL — ABNORMAL HIGH (ref 0–99)
NonHDL: 131.12
Total CHOL/HDL Ratio: 3
Triglycerides: 78 mg/dL (ref 0.0–149.0)
VLDL: 15.6 mg/dL (ref 0.0–40.0)

## 2022-07-25 LAB — VITAMIN B12: Vitamin B-12: 368 pg/mL (ref 211–911)

## 2022-07-25 LAB — HEPATIC FUNCTION PANEL
ALT: 28 U/L (ref 0–35)
AST: 26 U/L (ref 0–37)
Albumin: 3.8 g/dL (ref 3.5–5.2)
Alkaline Phosphatase: 52 U/L (ref 39–117)
Bilirubin, Direct: 0.2 mg/dL (ref 0.0–0.3)
Total Bilirubin: 0.8 mg/dL (ref 0.2–1.2)
Total Protein: 7.7 g/dL (ref 6.0–8.3)

## 2022-07-25 LAB — TSH: TSH: 2.17 u[IU]/mL (ref 0.35–5.50)

## 2022-07-25 MED ORDER — ATORVASTATIN CALCIUM 20 MG PO TABS
20.0000 mg | ORAL_TABLET | Freq: Every day | ORAL | 0 refills | Status: DC
Start: 2022-07-25 — End: 2022-10-31

## 2022-07-31 DIAGNOSIS — E119 Type 2 diabetes mellitus without complications: Secondary | ICD-10-CM | POA: Diagnosis not present

## 2022-07-31 DIAGNOSIS — I10 Essential (primary) hypertension: Secondary | ICD-10-CM | POA: Diagnosis not present

## 2022-07-31 DIAGNOSIS — R0602 Shortness of breath: Secondary | ICD-10-CM | POA: Diagnosis not present

## 2022-07-31 DIAGNOSIS — E7849 Other hyperlipidemia: Secondary | ICD-10-CM | POA: Diagnosis not present

## 2022-09-14 ENCOUNTER — Other Ambulatory Visit: Payer: Self-pay | Admitting: Family Medicine

## 2022-09-19 ENCOUNTER — Telehealth: Payer: Self-pay | Admitting: Family Medicine

## 2022-09-19 NOTE — Telephone Encounter (Signed)
Called patient to schedule Medicare Annual Wellness Visit (AWV). Left message for patient to call back and schedule Medicare Annual Wellness Visit (AWV).  Last date of AWV: 08/30/21  Please schedule an appointment at any time with [facility] beverly or hannah kim.  If any questions, please contact me at [phone].  Thank you ,  Rudell Cobb AWV direct phone # [phone]

## 2022-09-24 ENCOUNTER — Other Ambulatory Visit: Payer: Medicare Other

## 2022-09-24 DIAGNOSIS — E1165 Type 2 diabetes mellitus with hyperglycemia: Secondary | ICD-10-CM

## 2022-09-24 LAB — LIPID PANEL
Cholesterol: 119 mg/dL (ref 0–200)
HDL: 50.5 mg/dL (ref >39.00)
LDL Cholesterol: 57 mg/dL (ref 0–99)
NonHDL: 68.74
Total CHOL/HDL Ratio: 2
Triglycerides: 61 mg/dL (ref 0.0–149.0)
VLDL: 12.2 mg/dL (ref 0.0–40.0)

## 2022-09-24 LAB — HEPATIC FUNCTION PANEL
ALT: 21 U/L (ref 0–35)
AST: 21 U/L (ref 0–37)
Albumin: 4 g/dL (ref 3.5–5.2)
Alkaline Phosphatase: 57 U/L (ref 39–117)
Bilirubin, Direct: 0.3 mg/dL (ref 0.0–0.3)
Total Bilirubin: 1 mg/dL (ref 0.2–1.2)
Total Protein: 7.9 g/dL (ref 6.0–8.3)

## 2022-09-27 DIAGNOSIS — Z6831 Body mass index (BMI) 31.0-31.9, adult: Secondary | ICD-10-CM | POA: Diagnosis not present

## 2022-09-27 DIAGNOSIS — M5416 Radiculopathy, lumbar region: Secondary | ICD-10-CM | POA: Diagnosis not present

## 2022-09-29 ENCOUNTER — Other Ambulatory Visit: Payer: Self-pay | Admitting: Family Medicine

## 2022-10-29 ENCOUNTER — Other Ambulatory Visit: Payer: Self-pay | Admitting: Family Medicine

## 2022-11-05 ENCOUNTER — Ambulatory Visit (INDEPENDENT_AMBULATORY_CARE_PROVIDER_SITE_OTHER): Payer: Medicare Other | Admitting: Family Medicine

## 2022-11-05 VITALS — BP 110/60 | HR 70 | Temp 98.2°F | Ht 61.0 in | Wt 165.2 lb

## 2022-11-05 DIAGNOSIS — Z7984 Long term (current) use of oral hypoglycemic drugs: Secondary | ICD-10-CM | POA: Diagnosis not present

## 2022-11-05 DIAGNOSIS — E1165 Type 2 diabetes mellitus with hyperglycemia: Secondary | ICD-10-CM | POA: Diagnosis not present

## 2022-11-05 DIAGNOSIS — E785 Hyperlipidemia, unspecified: Secondary | ICD-10-CM

## 2022-11-05 DIAGNOSIS — I10 Essential (primary) hypertension: Secondary | ICD-10-CM

## 2022-11-05 LAB — POCT GLYCOSYLATED HEMOGLOBIN (HGB A1C): Hemoglobin A1C: 7 % — AB (ref 4.0–5.6)

## 2022-11-05 MED ORDER — DEXCOM G7 SENSOR MISC: 2 refills | Status: AC

## 2022-11-05 MED ORDER — DEXCOM G7 RECEIVER DEVI: 2 refills | Status: AC

## 2022-11-05 MED ORDER — METFORMIN HCL 500 MG PO TABS: ORAL_TABLET | ORAL | 3 refills | Status: AC

## 2022-11-05 NOTE — Patient Instructions (Signed)
Increase the Metformin to 500 mg twice daily  Set up follow up in 4 months to recheck some labs

## 2022-11-05 NOTE — Progress Notes (Signed)
Established Patient Office Visit  Subjective   Patient ID: Kimberly [name], female    DOB: 1950/06/29  Age: 72 y.o. MRN: [id number]  Chief Complaint  Patient presents with   Medical Management of Chronic Issues    HPI   Kimberly [name] is seen for chronic medical follow-up.  She and her husband will be moving to town near [place] in about a month.  They have a son and daughter who live in [place].  Her husband plans to teach remotely at [employer] through the next year.  Kimberly [name] recently went on cholesterol medication with atorvastatin 20 mg daily and tolerating well.  Recent LDL cholesterol reduced from 116-57.  She has type 2 diabetes.  Currently on Jardiance 10 mg daily and metformin 500 mg twice daily.  Not monitoring blood sugars regularly.  She would like to consider continuous glucose monitor if covered by insurance.  Last A1c was 6.7%.  She has hypothyroidism treated with levothyroxine 50 mcg daily.  Compliant with all medications.  Her husband recently had CT angiogram of the coronaries and reportedly had a 60% blockage of 1 lesion.  [name] is inquiring about coronary calcium score or coronary angiography to further risk stratify.  She denies any recent chest pains.  She has been walking regularly.  Her blood pressure is currently very stable and well-controlled on low-dose amlodipine and irbesartan.  The ASCVD Risk score (Arnett DK, et al., 2019) failed to calculate for the following reasons:   The valid total cholesterol range is 130 to 320 mg/dL   Past Medical History:  Diagnosis Date   DDD (degenerative disc disease), lumbar    GERD (gastroesophageal reflux disease)    History of anemia    History of COVID-19    11-11-2020 positive result in care everywhere   History of DVT (deep vein thrombosis) 2003   per pt completed coumadin x 6 mon, never a blood clot prior to 2003 and none since   Hypertension    followed by pcp   (nuclear stress test in epic 05-13-2019, Low risk  without ischemia, normal LV and wall motion , ef 68%)   Hypothyroidism    followed by pcp   Lattice degeneration of right retina    OA (osteoarthritis)    Osteopenia    PMB (postmenopausal bleeding)    Pre-diabetes    Vegetarian diet    eats fish and eggs; no meat   Past Surgical History:  Procedure Laterality Date   CATARACT EXTRACTION W/ INTRAOCULAR LENS IMPLANT Bilateral 07/2020   COLONOSCOPY  2016   DILATATION & CURETTAGE/HYSTEROSCOPY WITH MYOSURE N/A 03/27/2021   Procedure: DILATATION & CURETTAGE/HYSTEROSCOPY/Polypectomy WITH MYOSURE;  Surgeon: Shea Evans, MD;  [facility];  Service: Gynecology;  Laterality: N/A;   KNEE ARTHROSCOPY Right 03/16/2003   OOPHORECTOMY Left 1999   benign cyst per pt   TOTAL KNEE ARTHROPLASTY Right 09/14/2014   Procedure: RIGHT TOTAL KNEE ARTHROPLASTY;  Surgeon: Valeria Batman, MD;  Location: [facility] OR;  Service: Orthopedics;  Laterality: Right;   TOTAL KNEE ARTHROPLASTY Left 11/20/2016   Procedure: LEFT TOTAL KNEE ARTHROPLASTY;  Surgeon: Valeria Batman, MD;  Location: [facility] OR;  Service: Orthopedics;  Laterality: Left;    reports that she has never smoked. She has never used smokeless tobacco. She reports that she does not drink alcohol and does not use drugs. family history includes Alcohol abuse in an other family member; Diabetes in her mother and another family member; Heart failure  in her father. Allergies  Allergen Reactions   Enoxaparin Sodium Itching and Swelling    SWELLING REACTION UNSPECIFIED     Review of Systems  Constitutional:  Negative for fever and malaise/fatigue.  Eyes:  Negative for blurred vision.  Respiratory:  Negative for shortness of breath.   Cardiovascular:  Negative for chest pain.  Neurological:  Negative for dizziness, weakness and headaches.      Objective:     BP 110/60 (BP Location: Left Arm, Patient Position: Sitting, Cuff Size: Normal)   Pulse 70   Temp 98.2 F (36.8 C)  (Oral)   Ht 5\' 1"  (1.549 m)   Wt 165 lb 3.2 oz (74.9 kg)   LMP 05/14/1998   SpO2 98%   BMI 31.21 kg/m  BP Readings from Last 3 Encounters:  11/05/22 110/60  07/20/22 132/74  04/11/22 130/66   Wt Readings from Last 3 Encounters:  11/05/22 165 lb 3.2 oz (74.9 kg)  07/20/22 165 lb 11.2 oz (75.2 kg)  04/11/22 168 lb 12.8 oz (76.6 kg)      Physical Exam Vitals reviewed.  Constitutional:      Appearance: She is well-developed.  Eyes:     Pupils: Pupils are equal, round, and reactive to light.  Neck:     Thyroid: No thyromegaly.     Vascular: No JVD.  Cardiovascular:     Rate and Rhythm: Normal rate and regular rhythm.     Heart sounds:     No gallop.  Pulmonary:     Effort: Pulmonary effort is normal. No respiratory distress.     Breath sounds: Normal breath sounds. No wheezing or rales.  Musculoskeletal:     Cervical back: Neck supple.     Right lower leg: No edema.     Left lower leg: No edema.  Neurological:     Mental Status: She is alert.      Results for orders placed or performed in visit on 11/05/22  POC HgB A1c  Result Value Ref Range   Hemoglobin A1C 7.0 (A) 4.0 - 5.6 %   HbA1c POC (<> result, manual entry)     HbA1c, POC (prediabetic range)     HbA1c, POC (controlled diabetic range)      Last CBC Lab Results  Component Value Date   WBC 6.7 03/27/2021   HGB 14.1 03/27/2021   HCT 41.8 03/27/2021   MCV 90.9 03/27/2021   MCH 30.7 03/27/2021   RDW 13.0 03/27/2021   PLT 250 03/27/2021   Last metabolic panel Lab Results  Component Value Date   GLUCOSE 123 (H) 07/25/2022   NA 135 07/25/2022   K 4.1 07/25/2022   CL 102 07/25/2022   CO2 26 07/25/2022   BUN 14 07/25/2022   CREATININE 0.59 07/25/2022   GFRNONAA >60 03/27/2021   CALCIUM 9.7 07/25/2022   PROT 7.9 09/24/2022   ALBUMIN 4.0 09/24/2022   BILITOT 1.0 09/24/2022   ALKPHOS 57 09/24/2022   AST 21 09/24/2022   ALT 21 09/24/2022   ANIONGAP 8 03/27/2021   Last lipids Lab Results   Component Value Date   CHOL 119 09/24/2022   HDL 50.50 09/24/2022   LDLCALC 57 09/24/2022   LDLDIRECT 142.2 03/20/2006   TRIG 61.0 09/24/2022   CHOLHDL 2 09/24/2022   Last hemoglobin A1c Lab Results  Component Value Date   HGBA1C 7.0 (A) 11/05/2022   Last thyroid functions Lab Results  Component Value Date   TSH 2.17 07/25/2022  The ASCVD Risk score (Arnett DK, et al., 2019) failed to calculate for the following reasons:   The valid total cholesterol range is 130 to 320 mg/dL    Assessment & Plan:   #1 type 2 diabetes.  A1c slightly up today to 7.0%.  We discussed titrating her metformin up to 500 mg 2 tablets twice daily with new prescription sent.  Continue Jardiance 10 mg daily.  Reassess A1c in 3 to 4 months.  She does plan to visit here in October and will then be transferring care afterwards to new provider in [place]  #2 hypertension stable and well-controlled on amlodipine and irbesartan.  Continue regular exercise habits.    #3 dyslipidemia improved with recent improvement in LDL cholesterol from 116-57 on atorvastatin.  Patient does have questions about further restratification for CAD and specifically inquiring about coronary calcium scan.  This will be ordered.  Continue low saturated fat diet.     No follow-ups on file.    Evelena Peat, MD

## 2022-11-16 DIAGNOSIS — M7661 Achilles tendinitis, right leg: Secondary | ICD-10-CM | POA: Diagnosis not present

## 2022-11-16 DIAGNOSIS — M792 Neuralgia and neuritis, unspecified: Secondary | ICD-10-CM | POA: Diagnosis not present

## 2022-11-16 DIAGNOSIS — M24571 Contracture, right ankle: Secondary | ICD-10-CM | POA: Diagnosis not present

## 2022-11-16 DIAGNOSIS — L84 Corns and callosities: Secondary | ICD-10-CM | POA: Diagnosis not present

## 2022-11-26 ENCOUNTER — Ambulatory Visit (HOSPITAL_COMMUNITY): Payer: Medicare Other

## 2022-11-28 ENCOUNTER — Other Ambulatory Visit: Payer: Self-pay | Admitting: Family Medicine

## 2022-11-28 MED ORDER — AMLODIPINE BESYLATE 2.5 MG PO TABS
2.5000 mg | ORAL_TABLET | Freq: Every day | ORAL | 3 refills | Status: AC
Start: 2022-11-28 — End: ?

## 2022-11-28 MED ORDER — ATORVASTATIN CALCIUM 20 MG PO TABS: 20.0000 mg | ORAL_TABLET | Freq: Every day | ORAL | 1 refills | Status: AC

## 2022-11-28 MED ORDER — LEVOTHYROXINE SODIUM 50 MCG PO TABS: 50.0000 ug | ORAL_TABLET | Freq: Every day | ORAL | 1 refills | Status: AC

## 2022-11-29 ENCOUNTER — Ambulatory Visit (HOSPITAL_COMMUNITY)
Admission: RE | Admit: 2022-11-29 | Discharge: 2022-11-29 | Disposition: A | Discharge status: Home / Self Care | Payer: Self-pay | Source: Ambulatory Visit | Attending: Family Medicine | Admitting: Family Medicine

## 2022-11-29 DIAGNOSIS — E785 Hyperlipidemia, unspecified: Secondary | ICD-10-CM | POA: Insufficient documentation

## 2022-11-29 DIAGNOSIS — I10 Essential (primary) hypertension: Secondary | ICD-10-CM | POA: Insufficient documentation

## 2022-11-29 DIAGNOSIS — E1165 Type 2 diabetes mellitus with hyperglycemia: Secondary | ICD-10-CM | POA: Insufficient documentation

## 2022-12-31 DIAGNOSIS — Z23 Encounter for immunization: Secondary | ICD-10-CM | POA: Diagnosis not present

## 2022-12-31 DIAGNOSIS — I10 Essential (primary) hypertension: Secondary | ICD-10-CM | POA: Diagnosis not present

## 2022-12-31 DIAGNOSIS — E782 Mixed hyperlipidemia: Secondary | ICD-10-CM | POA: Diagnosis not present

## 2022-12-31 DIAGNOSIS — E034 Atrophy of thyroid (acquired): Secondary | ICD-10-CM | POA: Diagnosis not present

## 2022-12-31 DIAGNOSIS — E1165 Type 2 diabetes mellitus with hyperglycemia: Secondary | ICD-10-CM | POA: Diagnosis not present

## 2023-02-13 DIAGNOSIS — M79641 Pain in right hand: Secondary | ICD-10-CM | POA: Diagnosis not present

## 2023-02-13 DIAGNOSIS — M25531 Pain in right wrist: Secondary | ICD-10-CM | POA: Diagnosis not present

## 2023-02-25 ENCOUNTER — Encounter: Payer: Self-pay | Admitting: Family Medicine

## 2023-02-25 ENCOUNTER — Ambulatory Visit (INDEPENDENT_AMBULATORY_CARE_PROVIDER_SITE_OTHER): Payer: Medicare Other | Admitting: Family Medicine

## 2023-02-25 VITALS — BP 126/76 | HR 65 | Temp 97.8°F | Ht 61.0 in | Wt 162.3 lb

## 2023-02-25 DIAGNOSIS — E1165 Type 2 diabetes mellitus with hyperglycemia: Secondary | ICD-10-CM | POA: Diagnosis not present

## 2023-02-25 DIAGNOSIS — I10 Essential (primary) hypertension: Secondary | ICD-10-CM | POA: Diagnosis not present

## 2023-02-25 DIAGNOSIS — Z7984 Long term (current) use of oral hypoglycemic drugs: Secondary | ICD-10-CM

## 2023-02-25 DIAGNOSIS — E039 Hypothyroidism, unspecified: Secondary | ICD-10-CM

## 2023-02-25 DIAGNOSIS — E785 Hyperlipidemia, unspecified: Secondary | ICD-10-CM

## 2023-02-25 LAB — POCT GLYCOSYLATED HEMOGLOBIN (HGB A1C): Hemoglobin A1C: 6.7 % — AB (ref 4.0–5.6)

## 2023-02-25 MED ORDER — EMPAGLIFLOZIN 10 MG PO TABS: 10.0000 mg | ORAL_TABLET | Freq: Every day | ORAL | 3 refills | Status: DC

## 2023-02-25 MED ORDER — EMPAGLIFLOZIN 10 MG PO TABS: 10.0000 mg | ORAL_TABLET | Freq: Every day | ORAL | 3 refills | Status: AC

## 2023-02-25 NOTE — Patient Instructions (Signed)
A1C today is 6.7%   keep up the good work.

## 2023-02-25 NOTE — Progress Notes (Signed)
Established Patient Office Visit  Subjective   Patient ID: Kimberly [name], female    DOB: 1950/07/19  Age: 72 y.o. MRN: [id number]  No chief complaint on file.   HPI   Kimberly [name] is seen for medical follow-up.  She and her husband just recently moved to [facility] to be near family.  Her husband is finishing up his year at [employer].  He has been head of department there is statistics for several years.  Killian has reestablished with primary care in [place] but she requested follow-up here today.  She has history of hypertension, hyperlipidemia, hypothyroidism, type 2 diabetes.  She has lost couple pounds since last visit.  Medications are reviewed which include amlodipine, atorvastatin, levothyroxine, metformin, irbesartan, Jardiance.  Requesting refills of Jardiance to be sent to [place] to her new pharmacy.  She never got Dexcom monitor and sensor filled.  Does not monitor blood sugars regularly.  Her lipids were checked in May and TSH was checked out in March.  Past Medical History:  Diagnosis Date   DDD (degenerative disc disease), lumbar    GERD (gastroesophageal reflux disease)    History of anemia    History of COVID-19    11-11-2020 positive result in care everywhere   History of DVT (deep vein thrombosis) 2003   per pt completed coumadin x 6 mon, never a blood clot prior to 2003 and none since   Hypertension    followed by pcp   (nuclear stress test in epic 05-13-2019, Low risk without ischemia, normal LV and wall motion , ef 68%)   Hypothyroidism    followed by pcp   Lattice degeneration of right retina    OA (osteoarthritis)    Osteopenia    PMB (postmenopausal bleeding)    Pre-diabetes    Vegetarian diet    eats fish and eggs; no meat   Past Surgical History:  Procedure Laterality Date   CATARACT EXTRACTION W/ INTRAOCULAR LENS IMPLANT Bilateral 07/2020   COLONOSCOPY  2016   DILATATION & CURETTAGE/HYSTEROSCOPY WITH MYOSURE N/A 03/27/2021   Procedure: DILATATION &  CURETTAGE/HYSTEROSCOPY/Polypectomy WITH MYOSURE;  Surgeon: Shea Evans, MD;  Location: [facility];  Service: Gynecology;  Laterality: N/A;   KNEE ARTHROSCOPY Right 03/16/2003   OOPHORECTOMY Left 1999   benign cyst per pt   TOTAL KNEE ARTHROPLASTY Right 09/14/2014   Procedure: RIGHT TOTAL KNEE ARTHROPLASTY;  Surgeon: Valeria Batman, MD;  Location: [facility] OR;  Service: Orthopedics;  Laterality: Right;   TOTAL KNEE ARTHROPLASTY Left 11/20/2016   Procedure: LEFT TOTAL KNEE ARTHROPLASTY;  Surgeon: Valeria Batman, MD;  Location: [facility] OR;  Service: Orthopedics;  Laterality: Left;    reports that she has never smoked. She has never used smokeless tobacco. She reports that she does not drink alcohol and does not use drugs. family history includes Alcohol abuse in an other family member; Diabetes in her mother and another family member; Heart failure in her father. Allergies  Allergen Reactions   Enoxaparin Sodium Itching and Swelling    SWELLING REACTION UNSPECIFIED       Review of Systems  Constitutional:  Negative for malaise/fatigue.  Eyes:  Negative for blurred vision.  Respiratory:  Negative for shortness of breath.   Cardiovascular:  Negative for chest pain.  Neurological:  Negative for dizziness, weakness and headaches.      Objective:     BP 126/76 (BP Location: Left Arm, Patient Position: Sitting, Cuff Size: Normal)   Pulse 65   Temp 97.8  F (36.6 C) (Oral)   Ht 5\' 1"  (1.549 m)   Wt 162 lb 4.8 oz (73.6 kg)   LMP 05/14/1998   SpO2 98%   BMI 30.67 kg/m  BP Readings from Last 3 Encounters:  02/25/23 126/76  11/05/22 110/60  07/20/22 132/74   Wt Readings from Last 3 Encounters:  02/25/23 162 lb 4.8 oz (73.6 kg)  11/05/22 165 lb 3.2 oz (74.9 kg)  07/20/22 165 lb 11.2 oz (75.2 kg)      Physical Exam Vitals reviewed.  Constitutional:      Appearance: She is well-developed.  Eyes:     Pupils: Pupils are equal, round, and reactive to light.  Neck:      Thyroid: No thyromegaly.     Vascular: No JVD.  Cardiovascular:     Rate and Rhythm: Normal rate and regular rhythm.     Heart sounds:     No gallop.  Pulmonary:     Effort: Pulmonary effort is normal. No respiratory distress.     Breath sounds: Normal breath sounds. No wheezing or rales.  Musculoskeletal:     Cervical back: Neck supple.     Right lower leg: No edema.     Left lower leg: No edema.  Neurological:     Mental Status: She is alert.      Results for orders placed or performed in visit on 02/25/23  POC HgB A1c  Result Value Ref Range   Hemoglobin A1C 6.7 (A) 4.0 - 5.6 %   HbA1c POC (<> result, manual entry)     HbA1c, POC (prediabetic range)     HbA1c, POC (controlled diabetic range)      Last CBC Lab Results  Component Value Date   WBC 6.7 03/27/2021   HGB 14.1 03/27/2021   HCT 41.8 03/27/2021   MCV 90.9 03/27/2021   MCH 30.7 03/27/2021   RDW 13.0 03/27/2021   PLT 250 03/27/2021   Last metabolic panel Lab Results  Component Value Date   GLUCOSE 123 (H) 07/25/2022   NA 135 07/25/2022   K 4.1 07/25/2022   CL 102 07/25/2022   CO2 26 07/25/2022   BUN 14 07/25/2022   CREATININE 0.59 07/25/2022   GFR 89.20 07/25/2022   CALCIUM 9.7 07/25/2022   PROT 7.9 09/24/2022   ALBUMIN 4.0 09/24/2022   BILITOT 1.0 09/24/2022   ALKPHOS 57 09/24/2022   AST 21 09/24/2022   ALT 21 09/24/2022   ANIONGAP 8 03/27/2021   Last lipids Lab Results  Component Value Date   CHOL 119 09/24/2022   HDL 50.50 09/24/2022   LDLCALC 57 09/24/2022   LDLDIRECT 142.2 03/20/2006   TRIG 61.0 09/24/2022   CHOLHDL 2 09/24/2022   Last hemoglobin A1c Lab Results  Component Value Date   HGBA1C 6.7 (A) 02/25/2023   Last vitamin D Lab Results  Component Value Date   VD25OH 31.09 10/10/2021   Last vitamin B12 and Folate Lab Results  Component Value Date   VITAMINB12 368 07/25/2022   FOLATE 13.4 09/30/2008      The ASCVD Risk score (Arnett DK, et al., 2019) failed to  calculate for the following reasons:   The valid total cholesterol range is 130 to 320 mg/dL    Assessment & Plan:   #1 type 2 diabetes controlled with A1c 6.7%.  Continue current regimen with metformin and Jardiance.  She will continue regular follow-up with her new primary in [place] going forward.  Continue regular eye exams.  #2 hypertension  stable and well-controlled on amlodipine and irbesartan.  Continue current regimen  #3 hypothyroidism treated with levothyroxine 50 mcg daily.  TSH at goal last spring.  Recommend follow-up by March  #4 hyperlipidemia treated with atorvastatin 20 mg daily.  Recheck lipids in about 6 months   No follow-ups on file.    Evelena Peat, MD

## 2023-02-26 ENCOUNTER — Ambulatory Visit: Payer: Medicare Other | Admitting: Family Medicine

## 2023-04-04 ENCOUNTER — Telehealth: Payer: Self-pay | Admitting: Family Medicine

## 2023-04-04 NOTE — Telephone Encounter (Signed)
Spoke with patient to schedule AWV  she stated she moved to [place]

## 2023-04-19 DIAGNOSIS — Z23 Encounter for immunization: Secondary | ICD-10-CM | POA: Diagnosis not present

## 2023-05-28 ENCOUNTER — Encounter (INDEPENDENT_AMBULATORY_CARE_PROVIDER_SITE_OTHER): Payer: Medicare Other | Admitting: Ophthalmology
